# Patient Record
Sex: Male | Born: 1955 | Race: Black or African American | Hispanic: No | Marital: Single | Smoking: Never smoker
Health system: Southern US, Community
[De-identification: ages and names within clinical notes are randomized; demographics above are authoritative.]

## PROBLEM LIST (undated history)

## (undated) DIAGNOSIS — I739 Peripheral vascular disease, unspecified: Secondary | ICD-10-CM

## (undated) DIAGNOSIS — R0600 Dyspnea, unspecified: Secondary | ICD-10-CM

## (undated) DIAGNOSIS — N189 Chronic kidney disease, unspecified: Secondary | ICD-10-CM

## (undated) DIAGNOSIS — I10 Essential (primary) hypertension: Secondary | ICD-10-CM

## (undated) DIAGNOSIS — H269 Unspecified cataract: Secondary | ICD-10-CM

## (undated) DIAGNOSIS — T8859XA Other complications of anesthesia, initial encounter: Secondary | ICD-10-CM

## (undated) DIAGNOSIS — G629 Polyneuropathy, unspecified: Secondary | ICD-10-CM

## (undated) DIAGNOSIS — I129 Hypertensive chronic kidney disease with stage 1 through stage 4 chronic kidney disease, or unspecified chronic kidney disease: Secondary | ICD-10-CM

## (undated) DIAGNOSIS — E049 Nontoxic goiter, unspecified: Secondary | ICD-10-CM

## (undated) DIAGNOSIS — H409 Unspecified glaucoma: Secondary | ICD-10-CM

## (undated) DIAGNOSIS — N186 End stage renal disease: Secondary | ICD-10-CM

## (undated) DIAGNOSIS — I959 Hypotension, unspecified: Secondary | ICD-10-CM

## (undated) HISTORY — PX: EYE SURGERY: SHX253

## (undated) HISTORY — DX: Peripheral vascular disease, unspecified: I73.9

## (undated) HISTORY — PX: AV FISTULA PLACEMENT: SHX1204

## (undated) HISTORY — PX: TOTAL HIP ARTHROPLASTY: SHX124

## (undated) HISTORY — DX: Unspecified glaucoma: H40.9

---
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
## 1999-08-02 ENCOUNTER — Emergency Department (HOSPITAL_COMMUNITY): Admission: EM | Admit: 1999-08-02 | Discharge: 1999-08-02 | Payer: Self-pay | Admitting: Emergency Medicine

## 2004-01-02 ENCOUNTER — Ambulatory Visit (HOSPITAL_COMMUNITY): Admission: RE | Admit: 2004-01-02 | Discharge: 2004-01-02 | Payer: Self-pay | Admitting: Nephrology

## 2004-01-04 ENCOUNTER — Ambulatory Visit (HOSPITAL_COMMUNITY): Admission: RE | Admit: 2004-01-04 | Discharge: 2004-01-04 | Payer: Self-pay | Admitting: Vascular Surgery

## 2004-01-27 ENCOUNTER — Ambulatory Visit (HOSPITAL_COMMUNITY): Admission: RE | Admit: 2004-01-27 | Discharge: 2004-01-27 | Payer: Self-pay | Admitting: Vascular Surgery

## 2004-07-06 ENCOUNTER — Ambulatory Visit (HOSPITAL_COMMUNITY): Admission: RE | Admit: 2004-07-06 | Discharge: 2004-07-06 | Payer: Self-pay | Admitting: Nephrology

## 2011-01-06 ENCOUNTER — Encounter: Payer: Self-pay | Admitting: Nephrology

## 2014-08-24 DIAGNOSIS — R52 Pain, unspecified: Secondary | ICD-10-CM | POA: Insufficient documentation

## 2014-09-14 DIAGNOSIS — L299 Pruritus, unspecified: Secondary | ICD-10-CM | POA: Insufficient documentation

## 2014-09-14 DIAGNOSIS — R519 Headache, unspecified: Secondary | ICD-10-CM | POA: Insufficient documentation

## 2014-09-14 DIAGNOSIS — R509 Fever, unspecified: Secondary | ICD-10-CM | POA: Insufficient documentation

## 2014-09-14 DIAGNOSIS — Z4931 Encounter for adequacy testing for hemodialysis: Secondary | ICD-10-CM | POA: Insufficient documentation

## 2014-09-14 DIAGNOSIS — D689 Coagulation defect, unspecified: Secondary | ICD-10-CM | POA: Insufficient documentation

## 2019-10-04 DIAGNOSIS — T7840XA Allergy, unspecified, initial encounter: Secondary | ICD-10-CM | POA: Insufficient documentation

## 2020-02-15 DIAGNOSIS — N2581 Secondary hyperparathyroidism of renal origin: Secondary | ICD-10-CM | POA: Diagnosis not present

## 2020-02-15 DIAGNOSIS — N186 End stage renal disease: Secondary | ICD-10-CM | POA: Diagnosis not present

## 2020-02-15 DIAGNOSIS — Z992 Dependence on renal dialysis: Secondary | ICD-10-CM | POA: Diagnosis not present

## 2020-02-15 DIAGNOSIS — Z23 Encounter for immunization: Secondary | ICD-10-CM | POA: Diagnosis not present

## 2020-02-16 ENCOUNTER — Encounter (HOSPITAL_COMMUNITY): Payer: Self-pay

## 2020-02-16 NOTE — Telephone Encounter (Signed)

## 2020-02-17 DIAGNOSIS — Z992 Dependence on renal dialysis: Secondary | ICD-10-CM | POA: Diagnosis not present

## 2020-02-17 DIAGNOSIS — N2581 Secondary hyperparathyroidism of renal origin: Secondary | ICD-10-CM | POA: Diagnosis not present

## 2020-02-17 DIAGNOSIS — N186 End stage renal disease: Secondary | ICD-10-CM | POA: Diagnosis not present

## 2020-02-17 DIAGNOSIS — Z23 Encounter for immunization: Secondary | ICD-10-CM | POA: Diagnosis not present

## 2020-02-18 ENCOUNTER — Ambulatory Visit (INDEPENDENT_AMBULATORY_CARE_PROVIDER_SITE_OTHER): Payer: Medicare Other | Admitting: Vascular Surgery

## 2020-02-18 ENCOUNTER — Other Ambulatory Visit: Payer: Self-pay

## 2020-02-18 ENCOUNTER — Encounter: Payer: Self-pay | Admitting: Vascular Surgery

## 2020-02-18 VITALS — BP 118/69 | HR 78 | Temp 98.0°F | Resp 20 | Ht 68.75 in | Wt 131.0 lb

## 2020-02-18 DIAGNOSIS — N186 End stage renal disease: Secondary | ICD-10-CM

## 2020-02-18 NOTE — Progress Notes (Signed)
Patient ID: Timothy Ponce, male   DOB: 1956/04/10, 64 y.o.   MRN: [id number]  Reason for Consult: New Patient (Initial Visit)   Referred by Claudia Desanctis, MD  Subjective:     HPI:  Timothy Ponce is a 64 y.o. male previous failed left arm access has been on dialysis for many years for the right arm.  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  States that he does not have any significant bleeding.  Does not have any continued pain.  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  No other tissue loss or ulceration in the hand.  Does not have any current complications from the large pseudoaneurysms.  Past Medical History:  Diagnosis Date  . Chronic kidney disease   . Glaucoma    History reviewed. No pertinent family history. Past Surgical History:  Procedure Laterality Date  . AV FISTULA PLACEMENT      Short Social History:  Social History   Tobacco Use  . Smoking status: Never Smoker  . Smokeless tobacco: Never Used  Substance Use Topics  . Alcohol use: Not Currently    Allergies  Allergen Reactions  . Iron Dextran     Other reaction(s): Breathing Problems  . Lac Bovis     Other reaction(s): Unknown    Current Outpatient Medications  Medication Sig Dispense Refill  . ALPHAGAN P 0.1 % SOLN     . calcitRIOL (ROCALTROL) 0.25 MCG capsule Take by mouth.    . dorzolamide-timolol (COSOPT) 22.3-6.8 MG/ML ophthalmic solution     . LUMIGAN 0.01 % SOLN     . Methoxy PEG-Epoetin Beta (MIRCERA IJ) Mircera    . multivitamin (RENA-VIT) TABS tablet Take by mouth.    . RHOPRESSA 0.02 % SOLN     . SENSIPAR 30 MG tablet Take 30 mg by mouth at bedtime.    . sevelamer carbonate (RENVELA) 800 MG tablet      No current facility-administered medications for this visit.    Review of Systems  Constitutional:  Constitutional negative. HENT: HENT negative.    Eyes: Eyes negative.  Respiratory: Respiratory negative.  Cardiovascular: Cardiovascular negative.  GI: Gastrointestinal negative.  Musculoskeletal: Positive for joint pain.  Skin:       Itching Neurological: Neurological negative. Psychiatric: Psychiatric negative.        Objective:  Objective   Vitals:   02/18/20 1124  BP: 118/69  Pulse: 78  Resp: 20  Temp: 98 F (36.7 C)  SpO2: 100%  Weight: 131 lb (59.4 kg)  Height: 5' 8.75" (1.746 m)   Body mass index is 19.49 kg/m.  Physical Exam HENT:     Head: Normocephalic.     Mouth/Throat:     Comments: Mask in place Eyes:     Pupils: Pupils are equal, round, and reactive to light.  Cardiovascular:     Rate and Rhythm: Normal rate.     Comments: Palpable radial pulses bilaterally Pulmonary:     Effort: Pulmonary effort is normal.  Abdominal:     General: Abdomen is flat.     Palpations: Abdomen is soft.  Musculoskeletal:        General: Normal range of motion.     Cervical back: Normal range of motion.     Comments: Right arm with 2 large pseudoaneurysms in the mid forearm.  There is significant  dilatation of the vein cephalad to this.  Skin:    Capillary Refill: Capillary refill takes less than 2 seconds.  Neurological:     Mental Status: He is alert.  Psychiatric:        Mood and Affect: Mood normal.        Thought Content: Thought content normal.     Data: No studies     Assessment/Plan:     64 year old male on dialysis via right arm AV fistula for many years.  2 large pseudoaneurysms but he is not having any bleeding.  There is some thin discolored skin but no ulceration.  Patient could always have conversion to an upper arm fistula ligation of the forearm possible excision of the pseudoaneurysms.  I would otherwise not recommend any intervention at this time given that he has no complications and could likely lead to thrombosis of the fistula in the forearm.  Patient will call if he has issues he does  demonstrate very good understanding and has a very good knowledge of dialysis in general     Waynetta Sandy MD [facility]

## 2020-02-19 DIAGNOSIS — Z23 Encounter for immunization: Secondary | ICD-10-CM | POA: Diagnosis not present

## 2020-02-19 DIAGNOSIS — N2581 Secondary hyperparathyroidism of renal origin: Secondary | ICD-10-CM | POA: Diagnosis not present

## 2020-02-19 DIAGNOSIS — Z992 Dependence on renal dialysis: Secondary | ICD-10-CM | POA: Diagnosis not present

## 2020-02-19 DIAGNOSIS — N186 End stage renal disease: Secondary | ICD-10-CM | POA: Diagnosis not present

## 2020-02-22 DIAGNOSIS — Z992 Dependence on renal dialysis: Secondary | ICD-10-CM | POA: Diagnosis not present

## 2020-02-22 DIAGNOSIS — Z23 Encounter for immunization: Secondary | ICD-10-CM | POA: Diagnosis not present

## 2020-02-22 DIAGNOSIS — N186 End stage renal disease: Secondary | ICD-10-CM | POA: Diagnosis not present

## 2020-02-22 DIAGNOSIS — N2581 Secondary hyperparathyroidism of renal origin: Secondary | ICD-10-CM | POA: Diagnosis not present

## 2020-02-24 DIAGNOSIS — Z992 Dependence on renal dialysis: Secondary | ICD-10-CM | POA: Diagnosis not present

## 2020-02-24 DIAGNOSIS — N186 End stage renal disease: Secondary | ICD-10-CM | POA: Diagnosis not present

## 2020-02-24 DIAGNOSIS — Z23 Encounter for immunization: Secondary | ICD-10-CM | POA: Diagnosis not present

## 2020-02-24 DIAGNOSIS — N2581 Secondary hyperparathyroidism of renal origin: Secondary | ICD-10-CM | POA: Diagnosis not present

## 2020-02-26 DIAGNOSIS — Z992 Dependence on renal dialysis: Secondary | ICD-10-CM | POA: Diagnosis not present

## 2020-02-26 DIAGNOSIS — Z23 Encounter for immunization: Secondary | ICD-10-CM | POA: Diagnosis not present

## 2020-02-26 DIAGNOSIS — N186 End stage renal disease: Secondary | ICD-10-CM | POA: Diagnosis not present

## 2020-02-26 DIAGNOSIS — N2581 Secondary hyperparathyroidism of renal origin: Secondary | ICD-10-CM | POA: Diagnosis not present

## 2020-02-29 DIAGNOSIS — Z992 Dependence on renal dialysis: Secondary | ICD-10-CM | POA: Diagnosis not present

## 2020-02-29 DIAGNOSIS — Z23 Encounter for immunization: Secondary | ICD-10-CM | POA: Diagnosis not present

## 2020-02-29 DIAGNOSIS — N2581 Secondary hyperparathyroidism of renal origin: Secondary | ICD-10-CM | POA: Diagnosis not present

## 2020-02-29 DIAGNOSIS — N186 End stage renal disease: Secondary | ICD-10-CM | POA: Diagnosis not present

## 2020-03-04 DIAGNOSIS — Z23 Encounter for immunization: Secondary | ICD-10-CM | POA: Diagnosis not present

## 2020-03-04 DIAGNOSIS — Z992 Dependence on renal dialysis: Secondary | ICD-10-CM | POA: Diagnosis not present

## 2020-03-04 DIAGNOSIS — N2581 Secondary hyperparathyroidism of renal origin: Secondary | ICD-10-CM | POA: Diagnosis not present

## 2020-03-04 DIAGNOSIS — N186 End stage renal disease: Secondary | ICD-10-CM | POA: Diagnosis not present

## 2020-03-07 DIAGNOSIS — Z23 Encounter for immunization: Secondary | ICD-10-CM | POA: Diagnosis not present

## 2020-03-07 DIAGNOSIS — N2581 Secondary hyperparathyroidism of renal origin: Secondary | ICD-10-CM | POA: Diagnosis not present

## 2020-03-07 DIAGNOSIS — Z992 Dependence on renal dialysis: Secondary | ICD-10-CM | POA: Diagnosis not present

## 2020-03-07 DIAGNOSIS — N186 End stage renal disease: Secondary | ICD-10-CM | POA: Diagnosis not present

## 2020-03-09 DIAGNOSIS — Z23 Encounter for immunization: Secondary | ICD-10-CM | POA: Diagnosis not present

## 2020-03-09 DIAGNOSIS — N186 End stage renal disease: Secondary | ICD-10-CM | POA: Diagnosis not present

## 2020-03-09 DIAGNOSIS — Z992 Dependence on renal dialysis: Secondary | ICD-10-CM | POA: Diagnosis not present

## 2020-03-09 DIAGNOSIS — N2581 Secondary hyperparathyroidism of renal origin: Secondary | ICD-10-CM | POA: Diagnosis not present

## 2020-03-11 DIAGNOSIS — N186 End stage renal disease: Secondary | ICD-10-CM | POA: Diagnosis not present

## 2020-03-11 DIAGNOSIS — N2581 Secondary hyperparathyroidism of renal origin: Secondary | ICD-10-CM | POA: Diagnosis not present

## 2020-03-11 DIAGNOSIS — Z992 Dependence on renal dialysis: Secondary | ICD-10-CM | POA: Diagnosis not present

## 2020-03-11 DIAGNOSIS — Z23 Encounter for immunization: Secondary | ICD-10-CM | POA: Diagnosis not present

## 2020-03-14 DIAGNOSIS — Z23 Encounter for immunization: Secondary | ICD-10-CM | POA: Diagnosis not present

## 2020-03-14 DIAGNOSIS — N2581 Secondary hyperparathyroidism of renal origin: Secondary | ICD-10-CM | POA: Diagnosis not present

## 2020-03-14 DIAGNOSIS — N186 End stage renal disease: Secondary | ICD-10-CM | POA: Diagnosis not present

## 2020-03-14 DIAGNOSIS — Z992 Dependence on renal dialysis: Secondary | ICD-10-CM | POA: Diagnosis not present

## 2020-05-17 DIAGNOSIS — R197 Diarrhea, unspecified: Secondary | ICD-10-CM | POA: Insufficient documentation

## 2020-05-30 ENCOUNTER — Encounter (HOSPITAL_COMMUNITY): Payer: Self-pay | Admitting: Emergency Medicine

## 2020-05-30 ENCOUNTER — Inpatient Hospital Stay (HOSPITAL_COMMUNITY)
Admission: EM | Admit: 2020-05-30 | Discharge: 2020-06-07 | DRG: 474 | Disposition: A | Discharge status: Skilled Nursing Facility | Payer: Medicare Other | Attending: Internal Medicine | Admitting: Internal Medicine

## 2020-05-30 ENCOUNTER — Emergency Department (HOSPITAL_COMMUNITY): Payer: Medicare Other

## 2020-05-30 DIAGNOSIS — I953 Hypotension of hemodialysis: Secondary | ICD-10-CM | POA: Diagnosis not present

## 2020-05-30 DIAGNOSIS — M869 Osteomyelitis, unspecified: Secondary | ICD-10-CM | POA: Diagnosis present

## 2020-05-30 DIAGNOSIS — N2581 Secondary hyperparathyroidism of renal origin: Secondary | ICD-10-CM | POA: Diagnosis present

## 2020-05-30 DIAGNOSIS — K5900 Constipation, unspecified: Secondary | ICD-10-CM | POA: Diagnosis not present

## 2020-05-30 DIAGNOSIS — D539 Nutritional anemia, unspecified: Secondary | ICD-10-CM | POA: Diagnosis present

## 2020-05-30 DIAGNOSIS — L02611 Cutaneous abscess of right foot: Secondary | ICD-10-CM | POA: Diagnosis present

## 2020-05-30 DIAGNOSIS — M868X7 Other osteomyelitis, ankle and foot: Secondary | ICD-10-CM | POA: Diagnosis present

## 2020-05-30 DIAGNOSIS — H409 Unspecified glaucoma: Secondary | ICD-10-CM | POA: Diagnosis present

## 2020-05-30 DIAGNOSIS — D631 Anemia in chronic kidney disease: Secondary | ICD-10-CM | POA: Diagnosis present

## 2020-05-30 DIAGNOSIS — E739 Lactose intolerance, unspecified: Secondary | ICD-10-CM | POA: Diagnosis present

## 2020-05-30 DIAGNOSIS — Z20822 Contact with and (suspected) exposure to covid-19: Secondary | ICD-10-CM | POA: Diagnosis present

## 2020-05-30 DIAGNOSIS — N186 End stage renal disease: Secondary | ICD-10-CM | POA: Diagnosis present

## 2020-05-30 DIAGNOSIS — Z888 Allergy status to other drugs, medicaments and biological substances status: Secondary | ICD-10-CM | POA: Diagnosis not present

## 2020-05-30 DIAGNOSIS — I739 Peripheral vascular disease, unspecified: Secondary | ICD-10-CM | POA: Diagnosis present

## 2020-05-30 DIAGNOSIS — Z79899 Other long term (current) drug therapy: Secondary | ICD-10-CM

## 2020-05-30 DIAGNOSIS — M86171 Other acute osteomyelitis, right ankle and foot: Secondary | ICD-10-CM | POA: Diagnosis not present

## 2020-05-30 DIAGNOSIS — E8889 Other specified metabolic disorders: Secondary | ICD-10-CM | POA: Diagnosis present

## 2020-05-30 DIAGNOSIS — Z992 Dependence on renal dialysis: Secondary | ICD-10-CM | POA: Diagnosis not present

## 2020-05-30 DIAGNOSIS — D62 Acute posthemorrhagic anemia: Secondary | ICD-10-CM | POA: Diagnosis not present

## 2020-05-30 DIAGNOSIS — D509 Iron deficiency anemia, unspecified: Secondary | ICD-10-CM | POA: Diagnosis not present

## 2020-05-30 DIAGNOSIS — N189 Chronic kidney disease, unspecified: Secondary | ICD-10-CM | POA: Diagnosis present

## 2020-05-30 DIAGNOSIS — Z751 Person awaiting admission to adequate facility elsewhere: Secondary | ICD-10-CM | POA: Diagnosis not present

## 2020-05-30 DIAGNOSIS — Z89421 Acquired absence of other right toe(s): Secondary | ICD-10-CM | POA: Diagnosis not present

## 2020-05-30 LAB — CBC WITH DIFFERENTIAL/PLATELET
Abs Immature Granulocytes: 0.02 10*3/uL (ref 0.00–0.07)
Basophils Absolute: 0 10*3/uL (ref 0.0–0.1)
Basophils Relative: 1 %
Eosinophils Absolute: 0.5 10*3/uL (ref 0.0–0.5)
Eosinophils Relative: 8 %
HCT: 30.7 % — ABNORMAL LOW (ref 39.0–52.0)
Hemoglobin: 9.4 g/dL — ABNORMAL LOW (ref 13.0–17.0)
Immature Granulocytes: 0 %
Lymphocytes Relative: 18 %
Lymphs Abs: 1.2 10*3/uL (ref 0.7–4.0)
MCH: 32.9 pg (ref 26.0–34.0)
MCHC: 30.6 g/dL (ref 30.0–36.0)
MCV: 107.3 fL — ABNORMAL HIGH (ref 80.0–100.0)
Monocytes Absolute: 0.6 10*3/uL (ref 0.1–1.0)
Monocytes Relative: 8 %
Neutro Abs: 4.5 10*3/uL (ref 1.7–7.7)
Neutrophils Relative %: 65 %
Platelets: 259 10*3/uL (ref 150–400)
RBC: 2.86 MIL/uL — ABNORMAL LOW (ref 4.22–5.81)
RDW: 16 % — ABNORMAL HIGH (ref 11.5–15.5)
WBC: 6.9 10*3/uL (ref 4.0–10.5)
nRBC: 0 % (ref 0.0–0.2)

## 2020-05-30 LAB — COMPREHENSIVE METABOLIC PANEL
ALT: 19 U/L (ref 0–44)
AST: 17 U/L (ref 15–41)
Albumin: 3.2 g/dL — ABNORMAL LOW (ref 3.5–5.0)
Alkaline Phosphatase: 96 U/L (ref 38–126)
Anion gap: 12 (ref 5–15)
BUN: 7 mg/dL — ABNORMAL LOW (ref 8–23)
CO2: 33 mmol/L — ABNORMAL HIGH (ref 22–32)
Calcium: 8.6 mg/dL — ABNORMAL LOW (ref 8.9–10.3)
Chloride: 93 mmol/L — ABNORMAL LOW (ref 98–111)
Creatinine, Ser: 5.72 mg/dL — ABNORMAL HIGH (ref 0.61–1.24)
GFR calc Af Amer: 11 mL/min — ABNORMAL LOW (ref >60)
GFR calc non Af Amer: 10 mL/min — ABNORMAL LOW (ref >60)
Glucose, Bld: 93 mg/dL (ref 70–99)
Potassium: 3.8 mmol/L (ref 3.5–5.1)
Sodium: 138 mmol/L (ref 135–145)
Total Bilirubin: 0.8 mg/dL (ref 0.3–1.2)
Total Protein: 7.3 g/dL (ref 6.5–8.1)

## 2020-05-30 LAB — LACTIC ACID, PLASMA: Lactic Acid, Venous: 1 mmol/L (ref 0.5–1.9)

## 2020-05-30 MED ORDER — VANCOMYCIN HCL IN DEXTROSE 1-5 GM/200ML-% IV SOLN
1000.0000 mg | Freq: Once | INTRAVENOUS | Status: AC
  Administered 2020-05-31: 1000 mg via INTRAVENOUS
  Filled 2020-05-30: qty 200

## 2020-05-30 MED ORDER — BRIMONIDINE TARTRATE 0.15 % OP SOLN: 1.0000 [drp] | Freq: Two times a day (BID) | OPHTHALMIC | Status: DC

## 2020-05-30 MED ORDER — NETARSUDIL DIMESYLATE 0.02 % OP SOLN
1.0000 [drp] | Freq: Every day | OPHTHALMIC | Status: DC
  Administered 2020-05-31 – 2020-06-06 (×7): 1 [drp] via OPHTHALMIC
  Filled 2020-05-30: qty 1

## 2020-05-30 MED ORDER — SEVELAMER CARBONATE 800 MG PO TABS
4000.0000 mg | ORAL_TABLET | Freq: Three times a day (TID) | ORAL | Status: DC
  Administered 2020-05-31 – 2020-06-07 (×20): 4000 mg via ORAL
  Filled 2020-05-30 (×20): qty 5

## 2020-05-30 MED ORDER — DORZOLAMIDE HCL-TIMOLOL MAL 2-0.5 % OP SOLN: 1.0000 [drp] | Freq: Two times a day (BID) | OPHTHALMIC | Status: DC

## 2020-05-30 MED ORDER — LATANOPROST 0.005 % OP SOLN: 1.0000 [drp] | Freq: Every day | OPHTHALMIC | Status: DC

## 2020-05-30 MED ORDER — CINACALCET HCL 30 MG PO TABS
30.0000 mg | ORAL_TABLET | Freq: Every day | ORAL | Status: DC
  Filled 2020-05-30 (×3): qty 1

## 2020-05-30 MED ORDER — DORZOLAMIDE HCL-TIMOLOL MAL 2-0.5 % OP SOLN
1.0000 [drp] | Freq: Two times a day (BID) | OPHTHALMIC | Status: DC
  Administered 2020-05-31 – 2020-06-07 (×17): 1 [drp] via OPHTHALMIC
  Filled 2020-05-30: qty 10

## 2020-05-30 MED ORDER — NETARSUDIL DIMESYLATE 0.02 % OP SOLN: 1.0000 [drp] | Freq: Every day | OPHTHALMIC | Status: DC

## 2020-05-30 MED ORDER — SEVELAMER CARBONATE 800 MG PO TABS
1600.0000 mg | ORAL_TABLET | ORAL | Status: DC | PRN
  Filled 2020-05-30: qty 2

## 2020-05-30 MED ORDER — BRIMONIDINE TARTRATE 0.15 % OP SOLN
1.0000 [drp] | Freq: Two times a day (BID) | OPHTHALMIC | Status: DC
  Administered 2020-05-31 – 2020-06-07 (×16): 1 [drp] via OPHTHALMIC
  Filled 2020-05-30: qty 5

## 2020-05-30 MED ORDER — SODIUM CHLORIDE 0.9% FLUSH: 3.0000 mL | Freq: Once | INTRAVENOUS | Status: DC

## 2020-05-30 MED ORDER — LATANOPROST 0.005 % OP SOLN
1.0000 [drp] | Freq: Every day | OPHTHALMIC | Status: DC
  Administered 2020-05-31 – 2020-06-06 (×8): 1 [drp] via OPHTHALMIC
  Filled 2020-05-30: qty 2.5

## 2020-05-30 NOTE — H&P (Signed)
Date: 05/30/2020               Patient Name:  Timothy Ponce MRN: [id number]  DOB: [birth year] Age / Sex: 64 y.o., male   PCP: Patient, No Pcp Per         Medical Service: Internal Medicine Teaching Service         Attending Physician: Dr. Sid Falcon, MD    First Contact: Dr. Darrick Meigs Pager: [phone]  Second Contact: Dr. Truman Hayward Pager: [phone]       After Hours (After 5p/  First Contact Pager: [phone]  weekends / holidays): Second Contact Pager: [phone]   Chief Complaint: Right foot pain and swelling  History of Present Illness: Patient is a 64 year old male with past medical history significant for ESRD on HD Tuesday/Thursday/Saturday and glaucoma who presents for evaluation of right foot pain and swelling.  Patient reports that approximately 3 weeks ago he noticed pain on his lateral foot when he woke up in the morning.  He is not aware of any injury that occurred.  Patient states that over the past few weeks the pain has gradually worsened and swelling has developed.  He was advised by [facility] to come in for further evaluation.  Patient denies fever, chills, nausea, vomiting, chest pain, shortness of breath.  He reports that he gets his dialysis on Tuesday, Thursday, Saturday and last complete session was today.  Meds:  Analgesic: *Tylenol 650 mg every 8 hours as needed for pain *Ibuprofen 600 mg every 6 hours as needed for pain Nephro: *Cinacalcet 30 mg at bedtime *Sevelamer carbonate (Renvela) 800 mg tablets - take 5 tablets with each meal, 2 tablets with snacks *Mircera IJ Ophthalmic:  *Alphagan eye drops - 1 drop in both eyes in morning and bedtime *Dorzolamide-timolol eye drops - 1 drop in both eyes twice daily *Lumigan eyes drops - 1 drop in both eyes at bedtime *Rhopressa eye drops - 1 drop into both eyes daily Other: *Multivitamin  Allergies: Allergies as of 05/30/2020 - Review Complete 05/30/2020  Allergen Reaction Noted  . Darvon [propoxyphene]  Other (See Comments) 05/30/2020  . Iron dextran  02/18/2020  . Lac bovis  02/18/2020  . Lactose intolerance (gi)  05/30/2020   Past Medical History:  Diagnosis Date  . Chronic kidney disease   . Glaucoma    Family History:  *Reports kidney issues in 2nd cousins  Social History:  *Denies alcohol, tobacco, or recreational drug use *Professor at [employer]  Review of Systems: A complete ROS was negative except as per HPI.  Physical Exam: Blood pressure (!) 155/66, pulse (!) 56, temperature 97.6 F (36.4 C), temperature source Oral, resp. rate 16, SpO2 100 %. Physical Exam Constitutional:      Appearance: He is not diaphoretic.  HENT:     Head: Normocephalic and atraumatic.  Eyes:     General:        Right eye: No discharge.        Left eye: No discharge.  Neck:     Trachea: No tracheal deviation.  Cardiovascular:     Rate and Rhythm: Normal rate and regular rhythm.     Heart sounds: No murmur heard.  No friction rub. No gallop.   Pulmonary:     Effort: Pulmonary effort is normal. No respiratory distress.     Breath sounds: Normal breath sounds. No wheezing or rales.  Abdominal:     General: There is no distension.     Palpations:  Abdomen is soft.     Tenderness: There is no abdominal tenderness. There is no guarding or rebound.  Musculoskeletal:        General: No tenderness or deformity. Normal range of motion.     Cervical back: Normal range of motion.  Skin:    General: Skin is warm and dry.     Findings: Erythema present. No rash.     Comments: Erythema and swelling on the lateral, dorsal surface of right foot. Pain with mild palpation. See photo below  Neurological:     Mental Status: He is alert.     Coordination: Coordination normal.  Psychiatric:        Cognition and Memory: Memory normal.        Judgment: Judgment normal.       CBC Latest Ref Rng & Units 05/30/2020  WBC 4.0 - 10.5 K/uL 6.9  Hemoglobin 13.0 - 17.0 g/dL 9.4(L)  Hematocrit 39 - 52 %  30.7(L)  Platelets 150 - 400 K/uL 259   CMP Latest Ref Rng & Units 05/30/2020  Glucose 70 - 99 mg/dL 93  BUN 8 - 23 mg/dL 7(L)  Creatinine 0.61 - 1.24 mg/dL 5.72(H)  Sodium 135 - 145 mmol/L 138  Potassium 3.5 - 5.1 mmol/L 3.8  Chloride 98 - 111 mmol/L 93(L)  CO2 22 - 32 mmol/L 33(H)  Calcium 8.9 - 10.3 mg/dL 8.6(L)  Total Protein 6.5 - 8.1 g/dL 7.3  Total Bilirubin 0.3 - 1.2 mg/dL 0.8  Alkaline Phos 38 - 126 U/L 96  AST 15 - 41 U/L 17  ALT 0 - 44 U/L 19   Right Foot Radiograph (05/30/20): FINDINGS: Vascular calcifications in the right foot. There is lucency in the 5th metatarsal head concerning for possible acute osteomyelitis. No fracture, subluxation or dislocation. IMPRESSION: Lucency within the 5th metatarsal head concerning for osteomyelitis.  Assessment & Plan by Problem: Active Problems:   Osteomyelitis [facility])  Patient is a 64 year old male with past medical history significant for ESRD on HD Tuesday/Thursday/Saturday and glaucoma who presents for evaluation of right foot pain and swelling.   # Osteomyelitis Radiograph reveals findings consistent with osteomyelitis of 5th metatarsal head. Patient without clinical evidence for systemic infection. Orthopedics consulted, will evaluate patient in AM *NPO at midnight *IV antibiotics following source control *MR right foot ordered  # ESRD Patient on HD T/R/F, last HD session today (05/30/20). BUN of 7, creatinine 5.72, bicarb 33, no indication for urgent dialysis. Access is right AV fistula. Patient will require HD in AM as patient to receive MR with contrast today. Patient discussed with nephrologist, Dr. Posey Pronto.  *Continue home cinacalcet and sevelamer carbonate *HD per nephrology  # Glaucoma: Continue home eye drops *Brimonidine, Cosopt, latanoprost, netarsudil dimesylate  Diet: Renal with fluid restriction DVT Ppx: SCDs, given potential for surgery in AM Code Status: FULL CODE Dispo: Admit patient to Inpatient with  expected length of stay greater than 2 midnights.  Signed: Jeanmarie Hubert, MD 05/30/2020, 10:56 PM

## 2020-05-30 NOTE — ED Notes (Signed)
Pt is a dialysis pt and no longer produces urine

## 2020-05-30 NOTE — Progress Notes (Signed)
Consult received.   Given the nature, extent, and duration of the wound as well as the patient's comorbidities, I have asked my colleague, Dr. Meridee Score, for his assistance with further assessment and management given his expertise and experience in this domain. Either Dr. Sharol Given or I will evaluate the patient tomorrow.   Altamese Advance, MD [facility], [facility] [phone]

## 2020-05-30 NOTE — ED Notes (Addendum)
Pt currently denying CXR until able to speak to MD. Will call Xray when pt able to go for Xray

## 2020-05-30 NOTE — ED Provider Notes (Signed)
[name] EMERGENCY DEPARTMENT Provider Note   CSN: [id number] Arrival date & time: 05/30/20  1337     History Chief Complaint  Patient presents with  . Wound Infection  . Foot Pain    Timothy Ponce is a 64 y.o. male.  Patient is a 64 year old male with history of chronic kidney disease.  He has been on hemodialysis for 30 years.  He presents today for evaluation of right fifth toe pain and swelling.  He states that it was evaluated at dialysis and they recommended he come here.  He denies fevers or chills.  The history is provided by the patient.  Foot Pain This is a new problem. The current episode started 2 days ago. The problem occurs constantly. The problem has been gradually worsening. Nothing aggravates the symptoms. Nothing relieves the symptoms. He has tried nothing for the symptoms.       Past Medical History:  Diagnosis Date  . Chronic kidney disease   . Glaucoma     There are no problems to display for this patient.   Past Surgical History:  Procedure Laterality Date  . AV FISTULA PLACEMENT         No family history on file.  Social History   Tobacco Use  . Smoking status: Never Smoker  . Smokeless tobacco: Never Used  Vaping Use  . Vaping Use: Never used  Substance Use Topics  . Alcohol use: Not Currently  . Drug use: Never    Home Medications Prior to Admission medications   Medication Sig Start Date End Date Taking? Authorizing Provider  acetaminophen (TYLENOL) 650 MG CR tablet Take 650 mg by mouth every 8 (eight) hours as needed for pain (At the time of dialysis).   Yes [provider]  ALPHAGAN P 0.1 % SOLN Place 1 drop into both eyes in the morning and at bedtime.  01/14/20  Yes [provider]  dorzolamide-timolol (COSOPT) 22.3-6.8 MG/ML ophthalmic solution Place 1 drop into both eyes 2 (two) times daily.  02/05/20  Yes [provider]  ibuprofen (ADVIL) 200 MG tablet Take 600 mg by mouth  every 6 (six) hours as needed for moderate pain.   Yes [provider]  LUMIGAN 0.01 % SOLN Place 1 drop into both eyes at bedtime.  01/31/20  Yes [provider]  multivitamin (RENA-VIT) TABS tablet Take 1 tablet by mouth daily.  10/25/14  Yes [provider]  RHOPRESSA 0.02 % SOLN Place 1 drop into both eyes daily.  01/18/20  Yes [provider]  SENSIPAR 30 MG tablet Take 30 mg by mouth at bedtime. 11/19/19  Yes [provider]  sevelamer carbonate (RENVELA) 800 MG tablet Take 1,600-4,000 mg by mouth See admin instructions. Takes 5 tablets with each meal and 2 tablets snacks. 01/14/20  Yes [provider]  Methoxy PEG-Epoetin Beta (MIRCERA IJ) Mircera 12/30/19 12/28/20  [provider]    Allergies    Darvon [propoxyphene], Iron dextran, Lac bovis, and Lactose intolerance (gi)  Review of Systems   Review of Systems  All other systems reviewed and are negative.   Physical Exam Updated Vital Signs BP (!) 155/66 (BP Location: Left Arm)   Pulse (!) 56   Temp 97.6 F (36.4 C) (Oral)   Resp 16   SpO2 100%   Physical Exam Vitals and nursing note reviewed.  Constitutional:      General: He is not in acute distress.    Appearance: He is  well-developed. He is not diaphoretic.  HENT:     Head: Normocephalic and atraumatic.  Cardiovascular:     Rate and Rhythm: Normal rate and regular rhythm.     Heart sounds: No murmur heard.  No friction rub.  Pulmonary:     Effort: Pulmonary effort is normal. No respiratory distress.     Breath sounds: Normal breath sounds. No wheezing or rales.  Abdominal:     General: Bowel sounds are normal. There is no distension.     Palpations: Abdomen is soft.     Tenderness: There is no abdominal tenderness.  Musculoskeletal:        General: Normal range of motion.     Cervical back: Normal range of motion and neck supple.     Comments: The right fifth toe has cracking and drainage from the space  between the fourth and fifth toes.  There is swelling and inflammation extending into the distal foot.  Skin:    General: Skin is warm and dry.  Neurological:     Mental Status: He is alert and oriented to person, place, and time.     Coordination: Coordination normal.     ED Results / Procedures / Treatments   Labs (all labs ordered are listed, but only abnormal results are displayed) Labs Reviewed  COMPREHENSIVE METABOLIC PANEL - Abnormal; Notable for the following components:      Result Value   Chloride 93 (*)    CO2 33 (*)    BUN 7 (*)    Creatinine, Ser 5.72 (*)    Calcium 8.6 (*)    Albumin 3.2 (*)    GFR calc non Af Amer 10 (*)    GFR calc Af Amer 11 (*)    All other components within normal limits  CBC WITH DIFFERENTIAL/PLATELET - Abnormal; Notable for the following components:   RBC 2.86 (*)    Hemoglobin 9.4 (*)    HCT 30.7 (*)    MCV 107.3 (*)    RDW 16.0 (*)    All other components within normal limits  LACTIC ACID, PLASMA  LACTIC ACID, PLASMA  URINALYSIS, ROUTINE W REFLEX MICROSCOPIC    EKG None  Radiology DG Foot Complete Right  Result Date: 05/30/2020 CLINICAL DATA:  Fifth toe infection EXAM: RIGHT FOOT COMPLETE - 3+ VIEW COMPARISON:  None. FINDINGS: Vascular calcifications in the right foot. There is lucency in the 5th metatarsal head concerning for possible acute osteomyelitis. No fracture, subluxation or dislocation. IMPRESSION: Lucency within the 5th metatarsal head concerning for osteomyelitis. Electronically Signed   By: Rolm Baptise M.D.   On: 05/30/2020 21:39    Procedures Procedures (including critical care time)  Medications Ordered in ED Medications  sodium chloride flush (NS) 0.9 % injection 3 mL (has no administration in time range)    ED Course  I have reviewed the triage vital signs and the nursing notes.  Pertinent labs & imaging results that were available during my care of the patient were reviewed by me and considered in my  medical decision making (see chart for details).    MDM Rules/Calculators/A&P  Patient presenting here with complaints of swelling and drainage from his fifth toe.  He has history of renal disease and is on dialysis.  He does have skin breakdown with drainage between the fourth and fifth toes and some swelling extending into the distal foot.  His laboratory studies are basically unremarkable.  He is afebrile.  However, x-rays do show findings concerning for  osteomyelitis in the distal metatarsal.  This finding was discussed with Dr. Marcelino Scot from orthopedics who will have the patient evaluated in the morning.  I have spoken with the hospitalist who agrees to admit.  Final Clinical Impression(s) / ED Diagnoses Final diagnoses:  None    Rx / DC Orders ED Discharge Orders    None       Veryl Speak, MD 05/30/20 2255

## 2020-05-30 NOTE — ED Triage Notes (Signed)
Pt. Stated, I have infected little toe b ut the infection covers the entire foot.

## 2020-05-30 NOTE — ED Notes (Signed)
Attending MD at bedside.

## 2020-05-31 ENCOUNTER — Encounter (HOSPITAL_COMMUNITY): Payer: Self-pay | Admitting: Internal Medicine

## 2020-05-31 ENCOUNTER — Other Ambulatory Visit: Payer: Self-pay | Admitting: Physician Assistant

## 2020-05-31 ENCOUNTER — Ambulatory Visit: Payer: Medicare Other | Admitting: Occupational Therapy

## 2020-05-31 ENCOUNTER — Other Ambulatory Visit: Payer: Self-pay

## 2020-05-31 ENCOUNTER — Inpatient Hospital Stay (HOSPITAL_COMMUNITY): Payer: Medicare Other

## 2020-05-31 DIAGNOSIS — M869 Osteomyelitis, unspecified: Secondary | ICD-10-CM

## 2020-05-31 DIAGNOSIS — L02611 Cutaneous abscess of right foot: Secondary | ICD-10-CM

## 2020-05-31 DIAGNOSIS — M86171 Other acute osteomyelitis, right ankle and foot: Secondary | ICD-10-CM

## 2020-05-31 DIAGNOSIS — D631 Anemia in chronic kidney disease: Secondary | ICD-10-CM

## 2020-05-31 DIAGNOSIS — Z992 Dependence on renal dialysis: Secondary | ICD-10-CM

## 2020-05-31 DIAGNOSIS — I739 Peripheral vascular disease, unspecified: Secondary | ICD-10-CM | POA: Diagnosis present

## 2020-05-31 DIAGNOSIS — N186 End stage renal disease: Secondary | ICD-10-CM

## 2020-05-31 DIAGNOSIS — H409 Unspecified glaucoma: Secondary | ICD-10-CM

## 2020-05-31 LAB — CBC
HCT: 31.3 % — ABNORMAL LOW (ref 39.0–52.0)
Hemoglobin: 9.9 g/dL — ABNORMAL LOW (ref 13.0–17.0)
MCH: 33.3 pg (ref 26.0–34.0)
MCHC: 31.6 g/dL (ref 30.0–36.0)
MCV: 105.4 fL — ABNORMAL HIGH (ref 80.0–100.0)
Platelets: 267 10*3/uL (ref 150–400)
RBC: 2.97 MIL/uL — ABNORMAL LOW (ref 4.22–5.81)
RDW: 15.9 % — ABNORMAL HIGH (ref 11.5–15.5)
WBC: 6.7 10*3/uL (ref 4.0–10.5)
nRBC: 0.3 % — ABNORMAL HIGH (ref 0.0–0.2)

## 2020-05-31 LAB — RENAL FUNCTION PANEL
Albumin: 2.9 g/dL — ABNORMAL LOW (ref 3.5–5.0)
Anion gap: 12 (ref 5–15)
BUN: 16 mg/dL (ref 8–23)
CO2: 30 mmol/L (ref 22–32)
Calcium: 9.4 mg/dL (ref 8.9–10.3)
Chloride: 93 mmol/L — ABNORMAL LOW (ref 98–111)
Creatinine, Ser: 7.63 mg/dL — ABNORMAL HIGH (ref 0.61–1.24)
GFR calc Af Amer: 8 mL/min — ABNORMAL LOW (ref >60)
GFR calc non Af Amer: 7 mL/min — ABNORMAL LOW (ref >60)
Glucose, Bld: 92 mg/dL (ref 70–99)
Phosphorus: 3.6 mg/dL (ref 2.5–4.6)
Potassium: 4.6 mmol/L (ref 3.5–5.1)
Sodium: 135 mmol/L (ref 135–145)

## 2020-05-31 LAB — C-REACTIVE PROTEIN: CRP: 2.1 mg/dL — ABNORMAL HIGH (ref <1.0)

## 2020-05-31 LAB — SARS CORONAVIRUS 2 BY RT PCR (HOSPITAL ORDER, PERFORMED IN ~~LOC~~ HOSPITAL LAB): SARS Coronavirus 2: NEGATIVE

## 2020-05-31 LAB — HIV ANTIBODY (ROUTINE TESTING W REFLEX): HIV Screen 4th Generation wRfx: NONREACTIVE

## 2020-05-31 LAB — SEDIMENTATION RATE: Sed Rate: 65 mm/hr — ABNORMAL HIGH (ref 0–16)

## 2020-05-31 LAB — VITAMIN B12: Vitamin B-12: 1672 pg/mL — ABNORMAL HIGH (ref 180–914)

## 2020-05-31 LAB — LACTIC ACID, PLASMA: Lactic Acid, Venous: 1.2 mmol/L (ref 0.5–1.9)

## 2020-05-31 MED ORDER — ACETAMINOPHEN 325 MG PO TABS: 650.0000 mg | ORAL_TABLET | Freq: Four times a day (QID) | ORAL | Status: DC | PRN

## 2020-05-31 MED ORDER — VANCOMYCIN HCL IN DEXTROSE 500-5 MG/100ML-% IV SOLN
500.0000 mg | INTRAVENOUS | Status: DC
  Administered 2020-06-01: 500 mg via INTRAVENOUS

## 2020-05-31 MED ORDER — HYDROMORPHONE HCL 2 MG PO TABS
1.0000 mg | ORAL_TABLET | ORAL | Status: DC | PRN
  Administered 2020-05-31 – 2020-06-02 (×8): 1 mg via ORAL
  Filled 2020-05-31 (×8): qty 1

## 2020-05-31 MED ORDER — RENA-VITE PO TABS
1.0000 | ORAL_TABLET | Freq: Every day | ORAL | Status: DC
  Administered 2020-05-31 – 2020-06-07 (×8): 1 via ORAL
  Filled 2020-05-31 (×9): qty 1

## 2020-05-31 MED ORDER — HEPARIN SODIUM (PORCINE) 5000 UNIT/ML IJ SOLN
5000.0000 [IU] | Freq: Three times a day (TID) | INTRAMUSCULAR | Status: AC
  Administered 2020-05-31: 5000 [IU] via SUBCUTANEOUS
  Filled 2020-05-31 (×2): qty 1

## 2020-05-31 MED ORDER — CHLORHEXIDINE GLUCONATE CLOTH 2 % EX PADS
6.0000 | MEDICATED_PAD | Freq: Every day | CUTANEOUS | Status: DC
  Administered 2020-05-31 – 2020-06-07 (×8): 6 via TOPICAL

## 2020-05-31 MED ORDER — HEPARIN SODIUM (PORCINE) 5000 UNIT/ML IJ SOLN: 5000.0000 [IU] | Freq: Three times a day (TID) | INTRAMUSCULAR | Status: DC

## 2020-05-31 NOTE — Consult Note (Signed)
[facility] Renal Consultation Note    Indication for Consultation:  Management of ESRD/hemodialysis, anemia, hypertension/volume, and secondary hyperparathyroidism.  HPI: Timothy Ponce is a 64 y.o. male with PMH including ESRD on dialysis TTS for 30 years and glaucoma/cataracts who presented to the ED on 05/30/20 with R foot pain. Patient reports worsening pain and redness of the foot for the past several days. He was seen by NP at outpatient dialysis unit and advised to go to ED for evaluation. He did receive his full HD treatment yesterday. On arrival, afebrile with VSS, no leukocytosis, Hgb 9.4, BUN 7, Cr 5.72, K+ 4.6. Xray concerning for osteomyelitis in the distal metatarsal. Patient was seen by ortho this AM and started on IV vancomycin with plan for fifth ray amputation on Friday.  On exam, patient denies SOB, CP, palpitations, dizziness. Reports some abdominal pain last night that resolved with eating, no nausea, vomiting or diarrhea. Of note recent outpatient AF decreased and  Pt had been referred for a fistulogram. He believes this was due to swelling and not a true decline. 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 Donzetta Matters in 02/2019 with no intervention indicated at that time.   Past Medical History:  Diagnosis Date   Chronic kidney disease    Glaucoma    Past Surgical History:  Procedure Laterality Date   AV FISTULA PLACEMENT     History reviewed. No pertinent family history. Social History:  reports that he has never smoked. He has never used smokeless tobacco. He reports previous alcohol use. He reports that he does not use drugs.  ROS: As per HPI otherwise negative.   Physical Exam: Vitals:   05/31/20 0105 05/31/20 0146 05/31/20 0536 05/31/20 0900  BP: (!) 153/68 139/84 123/76   Pulse: (!) 54 89 61   Resp: 17 18 18    Temp: 98 F (36.7 C) 98.2 F (36.8 C) 97.9 F (36.6 C)   TempSrc: Oral Oral Oral   SpO2: 98%  100% 100%   Weight:    59.6 kg     General: Well developed, well nourished, in no acute distress. Head: Normocephalic, atraumatic, sclera non-icteric, mucus membranes are moist. Neck: JVD not elevated. Lungs: Clear bilaterally to auscultation without wheezes, rales, or rhonchi. Breathing is unlabored. Heart: RRR with normal S1, S2. No murmurs, rubs, or gallops appreciated. Abdomen: Soft, non-tender, non-distended with normoactive bowel sounds. No rebound/guarding. No obvious abdominal masses. Musculoskeletal:  Strength and tone appear normal for age. Lower extremities: No edema or ischemic changes, no open wounds. Neuro: Alert and oriented X 3. Moves all extremities spontaneously. Psych:  Responds to questions appropriately with a normal affect. Dialysis Access: RUE AVF with stable aneurysms, + t/b, no bleeding/drainage  Allergies  Allergen Reactions   Darvon [Propoxyphene] Other (See Comments)    Lungs filling up with fluid   Iron Dextran     Other reaction(s): Breathing Problems   Lac Bovis     Other reaction(s): Unknown   Lactose Intolerance (Gi)     unknown   Prior to Admission medications   Medication Sig Start Date End Date Taking? Authorizing Provider  acetaminophen (TYLENOL) 650 MG CR tablet Take 650 mg by mouth every 8 (eight) hours as needed for pain (At the time of dialysis).   Yes [provider]  ALPHAGAN P 0.1 % SOLN Place 1 drop into both eyes in the morning and at bedtime.  01/14/20  Yes [provider]  dorzolamide-timolol (COSOPT) 22.3-6.8 MG/ML ophthalmic solution Place 1 drop into both eyes 2 (two) times daily.  02/05/20  Yes [provider]  ibuprofen (ADVIL) 200 MG tablet Take 600 mg by mouth every 6 (six) hours as needed for moderate pain.   Yes [provider]  LUMIGAN 0.01 % SOLN Place 1 drop into both eyes at bedtime.  01/31/20  Yes [provider]  multivitamin (RENA-VIT) TABS tablet Take 1 tablet by mouth  daily.  10/25/14  Yes [provider]  RHOPRESSA 0.02 % SOLN Place 1 drop into both eyes daily.  01/18/20  Yes [provider]  SENSIPAR 30 MG tablet Take 30 mg by mouth at bedtime. 11/19/19  Yes [provider]  sevelamer carbonate (RENVELA) 800 MG tablet Take 1,600-4,000 mg by mouth See admin instructions. Takes 5 tablets with each meal and 2 tablets snacks. 01/14/20  Yes [provider]  Methoxy PEG-Epoetin Beta (MIRCERA IJ) Mircera 12/30/19 12/28/20  [provider]   Current Facility-Administered Medications  Medication Dose Route Frequency Provider Last Rate Last Admin   acetaminophen (TYLENOL) tablet 650 mg  650 mg Oral Q6H PRN Mosetta Anis, MD       brimonidine (ALPHAGAN) 0.15 % ophthalmic solution 1 drop  1 drop Both Eyes BID Jean Rosenthal, MD   1 drop at 05/31/20 4259   cinacalcet (SENSIPAR) tablet 30 mg  30 mg Oral QHS Agyei, Obed K, MD       dorzolamide-timolol (COSOPT) 22.3-6.8 MG/ML ophthalmic solution 1 drop  1 drop Both Eyes BID Jean Rosenthal, MD   1 drop at 05/31/20 0824   HYDROmorphone (DILAUDID) tablet 1 mg  1 mg Oral Q4H PRN Mosetta Anis, MD       latanoprost (XALATAN) 0.005 % ophthalmic solution 1 drop  1 drop Both Eyes QHS Agyei, Obed K, MD   1 drop at 05/31/20 0210   Netarsudil Dimesylate 0.02 % SOLN 1 drop  1 drop Both Eyes Daily Agyei, Obed K, MD       sevelamer carbonate (RENVELA) tablet 1,600 mg  1,600 mg Oral PRN Jean Rosenthal, MD       sevelamer carbonate (RENVELA) tablet 4,000 mg  4,000 mg Oral TID WC Gilles Chiquito B, MD   4,000 mg at 05/31/20 0708   sodium chloride flush (NS) 0.9 % injection 3 mL  3 mL Intravenous Once Jean Rosenthal, MD       Labs: Basic Metabolic Panel: Recent Labs  Lab 05/30/20 1417 05/31/20 0751  NA 138 135  K 3.8 4.6  CL 93* 93*  CO2 33* 30  GLUCOSE 93 92  BUN 7* 16  CREATININE 5.72* 7.63*  CALCIUM 8.6* 9.4  PHOS  --  3.6   Liver Function Tests: Recent Labs  Lab 05/30/20 1417  05/31/20 0751  AST 17  --   ALT 19  --   ALKPHOS 96  --   BILITOT 0.8  --   PROT 7.3  --   ALBUMIN 3.2* 2.9*   CBC: Recent Labs  Lab 05/30/20 1417 05/31/20 0751  WBC 6.9 6.7  NEUTROABS 4.5  --   HGB 9.4* 9.9*  HCT 30.7* 31.3*  MCV 107.3* 105.4*  PLT 259 267   Studies/Results: MR FOOT RIGHT WO CONTRAST  Result Date: 05/31/2020 CLINICAL DATA:  Right foot wound EXAM: MRI OF THE RIGHT FOREFOOT WITHOUT CONTRAST TECHNIQUE: Multiplanar, multisequence MR imaging of the right was performed. No intravenous contrast was administered. COMPARISON:  None. FINDINGS: Bones/Joint/Cartilage There is increased T2 hyperintense signal with subtle T1 hypointensity involving the fifth metatarsal head. There is slight cortical irregularity seen laterally at the fifth metatarsal head. Normal osseous marrow signal seen throughout the remainder of the osseous structures. No large joint effusions. Ligaments The Lisfranc ligaments are intact. Muscles and Tendons Diffusely increased T2 hyperintense signal seen throughout the muscles. The flexor and extensor tendons are intact. The plantar fascia is intact. Soft tissues Along the dorsum and lateral aspect of the fifth metatarsal head there is subtle area of skin irregularity. A small loculated fluid collection is noted overlying the fifth metatarsal head which measures 1.7 x 0.6 x 1.5 cm. Subcutaneous edema seen overlying the dorsum of foot. IMPRESSION: Area of superficial ulceration with a probable soft tissue abscess overlying the fifth metatarsal head measuring 1.7 x 0.6 x 1.5 cm. Findings suggestive of osteomyelitis involving the fifth metatarsal head. No other acute osseous abnormalities. Electronically Signed   By: Prudencio Pair M.D.   On: 05/31/2020 05:16   DG Foot Complete Right  Result Date: 05/30/2020 CLINICAL DATA:  Fifth toe infection EXAM: RIGHT FOOT COMPLETE - 3+ VIEW COMPARISON:  None. FINDINGS: Vascular calcifications in the right foot. There is lucency  in the 5th metatarsal head concerning for possible acute osteomyelitis. No fracture, subluxation or dislocation. IMPRESSION: Lucency within the 5th metatarsal head concerning for osteomyelitis. Electronically Signed   By: Rolm Baptise M.D.   On: 05/30/2020 21:39    Dialysis Orders: Center: [facility] on TTS. TTS 180NRe, BFR 400/DFR 800, EDW 60kg, 2K/2Ca, time 3.5hr,  RUE AVF, heparin 1800 unit bolus mircera 173mcg IVP q 2 weeks- last dose 49mcg on 05/27/20 Calcitriol 1.75 mcg PO q HD Sensipar30mg  daily Binder: nevela 5 tabs TID with meals  Assessment/Plan: 1.  Osteomyelitis: Seen by ortho, on IV vanc and planned for 5th ray amputation on Friday.  2.  ESRD:  TTS schedule, no indication for urgent HD today. Plan next HD tomorrow per regular schedule. Please avoid gadolium contrast and use of RUE for BP/blood draws as possible. We can draw labs on dialysis on HD days per pt request.  3.  Hypertension/vonilume: BP controlled, below EDW and not volume overloaded on exam. Not on BP meds. BP tends to run low during dialysis. 4.  Anemia: Hgb 9.9, ESA dose recently increased and due 6/26. No IV Fe due to acute infection.  5.  Metabolic bone disease: Corr calcium high, hold calctriol and follow. Continue sensipar and binder.  6.  Nutrition:  Renal diet with fluid restrictions  Anice Paganini, PA-C 05/31/2020, 10:17 AM  [facility] Pager: [phone]

## 2020-05-31 NOTE — Progress Notes (Signed)
Pt left to MRI

## 2020-05-31 NOTE — Progress Notes (Signed)
NAME:  Timothy Ponce, MRN:  [id number], DOB:  1956/03/13, LOS: 1 ADMISSION DATE:  05/30/2020  Subjective   [name] was examined and evaluated at bedside this am. He mentions that he spoke with Dr.Duda earlier in the day and states he was informed he would most likely be taken to OR on Thursday. He states that he has been endorsing foot pain as he has not taken any medications for relief. Exacerbated by weight bearing. Pain is somewhat tolerable if he does not move his foot. He mentions that prior to pandemic, he could walk ~4 miles without claudication, dyspnea, or chest pain. He mentions significantly decreased exercise capacity since the pandemic due to staying home mostly. 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  Significant Hospital Events   6/15 admission 6/16 MRI showing osteomyelitis with abscess formation. Dr. Sharol Given planning for surgery on 6/18  Objective   Blood pressure 123/76, pulse 61, temperature 97.9 F (36.6 C), temperature source Oral, resp. rate 18, height 5' 8.75" (1.746 m), weight 59.6 kg, SpO2 100 %.     Intake/Output Summary (Last 24 hours) at 05/31/2020 1116 Last data filed at 05/31/2020 0146 Gross per 24 hour  Intake 120 ml  Output --  Net 120 ml    Examination: GENERAL: in no acute distress CARDIAC: heart RRR. No peripheral edema. Pedal pulses intact. Extremities warm. PULMONARY: breathing comfortably on room air. Lungs clear ABDOMEN: soft. Nontender to palpation.  Nondistended.  NEURO: alert and oriented SKIN: right foot exam similar to prior. Left foot with thick white tissue in the 3rd and 4th interdigit spaces. No drainage or tenderness to palpation  Consults:  ortho  Significant Diagnostic Tests:  R foot xray>>lucency in the 5th metatarsal head concerning for possible acute osteomyelitis.  R foot MRI>>area of superfiical  ulceration with probable soft tissue abscess overlying the 5th metatarsal head measuring 1.7x0.6x1.5cm. findings suggestive of osteomyelitis involving the 5th metatarsal head.  Micro Data:  6/15 blood culture x2>>  Antimicrobials:  Vancomycin 6/15>>  Summary  64 yo male with ESRD on HD (THS) who was admitted to [facility] 05/30/20 for 3w history of right foot pain. Xray in the ED concerning for osteomyelitis. Orthopedics was consulted and are planning to take to the OR on 6/18.   Assessment & Plan:  Active Problems:   Osteomyelitis of fifth toe of right foot (HCC)  Osteomyelitis of the 5th metatarsal head with soft tissue abscess. As seen on MRI. No sign of systemic infection at this time Plan Continue IV vancomycin with HD adjustments. Considered broader coverage however pt does not have any particular risk factors  Follow blood cultures F/u A1C to r/o DM Appreciate ortho recs: planning for 5th ray amputation on 6/18. Pain management: Prn po dilaudid   ESRD on HD (THS). Management per nephrology. Monitor renal function.  Macrocytic anemia. MCV 105. Hgb stable. Will obtain B12 to r/o deficiency. Suspect that infection and CKD are also playing a part.  Best practice:  CODE STATUS: FULL Diet: renal DVT for prophylaxis: heparin Social considerations/Family communication: per pt Dispo: pending further evaluation and management   Mitzi Hansen, MD [facility] PGY-1 Contact info:  Please page me at [phone] from 7am-5pm M-F.  Please use the [facility] after hours pager at [phone] after 5pm M-F and on weekends 05/31/20  11:16 AM  Labs    CBC Latest Ref Rng &  Units 05/31/2020 05/30/2020  WBC 4.0 - 10.5 K/uL 6.7 6.9  Hemoglobin 13.0 - 17.0 g/dL 9.9(L) 9.4(L)  Hematocrit 39 - 52 % 31.3(L) 30.7(L)  Platelets 150 - 400 K/uL 267 259   BMP Latest Ref Rng & Units 05/31/2020 05/30/2020  Glucose 70 - 99 mg/dL 92 93  BUN 8 - 23 mg/dL 16 7(L)  Creatinine 0.61 - 1.24 mg/dL  7.63(H) 5.72(H)  Sodium 135 - 145 mmol/L 135 138  Potassium 3.5 - 5.1 mmol/L 4.6 3.8  Chloride 98 - 111 mmol/L 93(L) 93(L)  CO2 22 - 32 mmol/L 30 33(H)  Calcium 8.9 - 10.3 mg/dL 9.4 8.6(L)

## 2020-05-31 NOTE — H&P (View-Only) (Signed)
ORTHOPAEDIC CONSULTATION  REQUESTING PHYSICIAN: Lucious Groves, DO  Chief Complaint: Pain swelling and fifth metatarsal  HPI: Timothy Ponce is a 64 y.o. male who presents with osteomyelitis of the fifth metatarsal head with draining ulcer.  Patient has end-stage renal disease on dialysis etiology unknown.  Patient does not have hypertension or diabetes.  No history of tobacco use.  Past Medical History:  Diagnosis Date   Chronic kidney disease    Glaucoma    Past Surgical History:  Procedure Laterality Date   AV FISTULA PLACEMENT     Social History   Socioeconomic History   Marital status: Single    Spouse name: Not on file   Number of children: Not on file   Years of education: Not on file   Highest education level: Not on file  Occupational History   Not on file  Tobacco Use   Smoking status: Never Smoker   Smokeless tobacco: Never Used  Vaping Use   Vaping Use: Never used  Substance and Sexual Activity   Alcohol use: Not Currently   Drug use: Never   Sexual activity: Not on file  Other Topics Concern   Not on file  Social History Narrative   Not on file   Social Determinants of Health   Financial Resource Strain:    Difficulty of Paying Living Expenses:   Food Insecurity:    Worried About Charity fundraiser in the Last Year:    Arboriculturist in the Last Year:   Transportation Needs:    Film/video editor (Medical):    Lack of Transportation (Non-Medical):   Physical Activity:    Days of Exercise per Week:    Minutes of Exercise per Session:   Stress:    Feeling of Stress :   Social Connections:    Frequency of Communication with Friends and Family:    Frequency of Social Gatherings with Friends and Family:    Attends Religious Services:    Active Member of Clubs or Organizations:    Attends Archivist Meetings:    Marital Status:    History reviewed. No pertinent family history. - negative  except otherwise stated in the family history section Allergies  Allergen Reactions   Darvon [Propoxyphene] Other (See Comments)    Lungs filling up with fluid   Iron Dextran     Other reaction(s): Breathing Problems   Lac Bovis     Other reaction(s): Unknown   Lactose Intolerance (Gi)     unknown   Prior to Admission medications   Medication Sig Start Date End Date Taking? Authorizing Provider  acetaminophen (TYLENOL) 650 MG CR tablet Take 650 mg by mouth every 8 (eight) hours as needed for pain (At the time of dialysis).   Yes [provider]  ALPHAGAN P 0.1 % SOLN Place 1 drop into both eyes in the morning and at bedtime.  01/14/20  Yes [provider]  dorzolamide-timolol (COSOPT) 22.3-6.8 MG/ML ophthalmic solution Place 1 drop into both eyes 2 (two) times daily.  02/05/20  Yes [provider]  ibuprofen (ADVIL) 200 MG tablet Take 600 mg by mouth every 6 (six) hours as needed for moderate pain.   Yes [provider]  LUMIGAN 0.01 % SOLN Place 1 drop into both eyes at bedtime.  01/31/20  Yes [provider]  multivitamin (RENA-VIT) TABS tablet Take 1 tablet by mouth daily.  10/25/14  Yes [provider]  RHOPRESSA 0.02 %  SOLN Place 1 drop into both eyes daily.  01/18/20  Yes [provider]  SENSIPAR 30 MG tablet Take 30 mg by mouth at bedtime. 11/19/19  Yes [provider]  sevelamer carbonate (RENVELA) 800 MG tablet Take 1,600-4,000 mg by mouth See admin instructions. Takes 5 tablets with each meal and 2 tablets snacks. 01/14/20  Yes [provider]  Methoxy PEG-Epoetin Beta (MIRCERA IJ) Mircera 12/30/19 12/28/20  [provider]   MR FOOT RIGHT WO CONTRAST  Result Date: 05/31/2020 CLINICAL DATA:  Right foot wound EXAM: MRI OF THE RIGHT FOREFOOT WITHOUT CONTRAST TECHNIQUE: Multiplanar, multisequence MR imaging of the right was performed. No intravenous contrast was administered. COMPARISON:  None.  FINDINGS: Bones/Joint/Cartilage There is increased T2 hyperintense signal with subtle T1 hypointensity involving the fifth metatarsal head. There is slight cortical irregularity seen laterally at the fifth metatarsal head. Normal osseous marrow signal seen throughout the remainder of the osseous structures. No large joint effusions. Ligaments The Lisfranc ligaments are intact. Muscles and Tendons Diffusely increased T2 hyperintense signal seen throughout the muscles. The flexor and extensor tendons are intact. The plantar fascia is intact. Soft tissues Along the dorsum and lateral aspect of the fifth metatarsal head there is subtle area of skin irregularity. A small loculated fluid collection is noted overlying the fifth metatarsal head which measures 1.7 x 0.6 x 1.5 cm. Subcutaneous edema seen overlying the dorsum of foot. IMPRESSION: Area of superficial ulceration with a probable soft tissue abscess overlying the fifth metatarsal head measuring 1.7 x 0.6 x 1.5 cm. Findings suggestive of osteomyelitis involving the fifth metatarsal head. No other acute osseous abnormalities. Electronically Signed   By: Prudencio Pair M.D.   On: 05/31/2020 05:16   DG Foot Complete Right  Result Date: 05/30/2020 CLINICAL DATA:  Fifth toe infection EXAM: RIGHT FOOT COMPLETE - 3+ VIEW COMPARISON:  None. FINDINGS: Vascular calcifications in the right foot. There is lucency in the 5th metatarsal head concerning for possible acute osteomyelitis. No fracture, subluxation or dislocation. IMPRESSION: Lucency within the 5th metatarsal head concerning for osteomyelitis. Electronically Signed   By: Rolm Baptise M.D.   On: 05/30/2020 21:39   - pertinent xrays, CT, MRI studies were reviewed and independently interpreted  Positive ROS: All other systems have been reviewed and were otherwise negative with the exception of those mentioned in the HPI and as above.  Physical Exam: General: Alert, no acute distress Psychiatric: Patient is  competent for consent with normal mood and affect Lymphatic: No axillary or cervical lymphadenopathy Cardiovascular: No pedal edema Respiratory: No cyanosis, no use of accessory musculature GI: No organomegaly, abdomen is soft and non-tender    Images:  @ENCIMAGES @  Labs:  No results found for: HGBA1C, ESRSEDRATE, CRP, LABURIC, REPTSTATUS, GRAMSTAIN, CULT, LABORGA  Lab Results  Component Value Date   ALBUMIN 3.2 (L) 05/30/2020    Neurologic: Patient does not have protective sensation bilateral lower extremities.   MUSCULOSKELETAL:   Skin: Examination patient has swelling and resolving cellulitis right forefoot laterally.  There is an ulcer in the fourth webspace.  Patient has a strong dorsalis pedis pulse bilaterally patient also has a palpable posterior tibial pulse.  Review of the radiographs shows the bony changes of the fifth metatarsal head.  Review of the MRI scan shows osteomyelitis involving the entire fifth metatarsal head without abscess.  Assessment: Assessment: End-stage renal disease on dialysis with osteomyelitis right foot fifth metatarsal head.    Plan: Plan: Patient is eating breakfast now, he states  he has not eaten in over a day.  Will plan for continue IV antibiotics with fifth ray amputation on Friday.  Anticipate patient can be discharged a day or 2 after surgery.  Thank you for the consult and the opportunity to see Timothy Ponce, [place] [phone] 7:44 AM

## 2020-05-31 NOTE — Progress Notes (Addendum)
Pharmacy Antibiotic Note  Timothy Ponce is a 64 y.o. male admitted on 05/30/2020 with osteomyelitis of R 5th metatarsal head. Planning amputation on 6/18.  Pharmacy has been consulted for Vancomycin dosing.    Vancomycin 1gm IV given at 3am today.   ESRD, usual TTS- HD, last 6/15 as outpatient.  Plan:  Vancomycin 500 mg IV after HD-TTS.  Expect next HD on schedule on 6/17.  Will follow for HD plans and adjust timing of Vanc doses as needed.  Height: 5' 8.75" (174.6 cm) Weight: 59.6 kg (131 lb 6.3 oz) IBW/kg (Calculated) : 70.13  Temp (24hrs), Avg:98 F (36.7 C), Min:97.6 F (36.4 C), Max:99.2 F (37.3 C)  Recent Labs  Lab 05/30/20 1417 05/30/20 1604 05/31/20 0751  WBC 6.9  --  6.7  CREATININE 5.72*  --  7.63*  LATICACIDVEN 1.0 1.2  --      Allergies  Allergen Reactions  . Darvon [Propoxyphene] Other (See Comments)    Lungs filling up with fluid  . Iron Dextran     Other reaction(s): Breathing Problems  . Lac Bovis     Other reaction(s): Unknown  . Lactose Intolerance (Gi)     unknown    Antimicrobials this admission:  Vancomycin 6/16 >>  Microbiology results:  no cultures  Thank you for allowing pharmacy to be a part of this patient's care.  Arty Baumgartner, [place] Phone: [phone] 05/31/2020 10:28 AM

## 2020-05-31 NOTE — Consult Note (Signed)
ORTHOPAEDIC CONSULTATION  REQUESTING PHYSICIAN: Lucious Groves, DO  Chief Complaint: Pain swelling and fifth metatarsal  HPI: Timothy Ponce is a 64 y.o. male who presents with osteomyelitis of the fifth metatarsal head with draining ulcer.  Patient has end-stage renal disease on dialysis etiology unknown.  Patient does not have hypertension or diabetes.  No history of tobacco use.  Past Medical History:  Diagnosis Date  . Chronic kidney disease   . Glaucoma    Past Surgical History:  Procedure Laterality Date  . AV FISTULA PLACEMENT     Social History   Socioeconomic History  . Marital status: Single    Spouse name: Not on file  . Number of children: Not on file  . Years of education: Not on file  . Highest education level: Not on file  Occupational History  . Not on file  Tobacco Use  . Smoking status: Never Smoker  . Smokeless tobacco: Never Used  Vaping Use  . Vaping Use: Never used  Substance and Sexual Activity  . Alcohol use: Not Currently  . Drug use: Never  . Sexual activity: Not on file  Other Topics Concern  . Not on file  Social History Narrative  . Not on file   Social Determinants of Health   Financial Resource Strain:   . Difficulty of Paying Living Expenses:   Food Insecurity:   . Worried About Charity fundraiser in the Last Year:   . Arboriculturist in the Last Year:   Transportation Needs:   . Film/video editor (Medical):   Marland Kitchen Lack of Transportation (Non-Medical):   Physical Activity:   . Days of Exercise per Week:   . Minutes of Exercise per Session:   Stress:   . Feeling of Stress :   Social Connections:   . Frequency of Communication with Friends and Family:   . Frequency of Social Gatherings with Friends and Family:   . Attends Religious Services:   . Active Member of Clubs or Organizations:   . Attends Archivist Meetings:   Marland Kitchen Marital Status:    History reviewed. No pertinent family history. - negative  except otherwise stated in the family history section Allergies  Allergen Reactions  . Darvon [Propoxyphene] Other (See Comments)    Lungs filling up with fluid  . Iron Dextran     Other reaction(s): Breathing Problems  . Lac Bovis     Other reaction(s): Unknown  . Lactose Intolerance (Gi)     unknown   Prior to Admission medications   Medication Sig Start Date End Date Taking? Authorizing Provider  acetaminophen (TYLENOL) 650 MG CR tablet Take 650 mg by mouth every 8 (eight) hours as needed for pain (At the time of dialysis).   Yes [provider]  ALPHAGAN P 0.1 % SOLN Place 1 drop into both eyes in the morning and at bedtime.  01/14/20  Yes [provider]  dorzolamide-timolol (COSOPT) 22.3-6.8 MG/ML ophthalmic solution Place 1 drop into both eyes 2 (two) times daily.  02/05/20  Yes [provider]  ibuprofen (ADVIL) 200 MG tablet Take 600 mg by mouth every 6 (six) hours as needed for moderate pain.   Yes [provider]  LUMIGAN 0.01 % SOLN Place 1 drop into both eyes at bedtime.  01/31/20  Yes [provider]  multivitamin (RENA-VIT) TABS tablet Take 1 tablet by mouth daily.  10/25/14  Yes [provider]  RHOPRESSA 0.02 %  SOLN Place 1 drop into both eyes daily.  01/18/20  Yes [provider]  SENSIPAR 30 MG tablet Take 30 mg by mouth at bedtime. 11/19/19  Yes [provider]  sevelamer carbonate (RENVELA) 800 MG tablet Take 1,600-4,000 mg by mouth See admin instructions. Takes 5 tablets with each meal and 2 tablets snacks. 01/14/20  Yes [provider]  Methoxy PEG-Epoetin Beta (MIRCERA IJ) Mircera 12/30/19 12/28/20  [provider]   MR FOOT RIGHT WO CONTRAST  Result Date: 05/31/2020 CLINICAL DATA:  Right foot wound EXAM: MRI OF THE RIGHT FOREFOOT WITHOUT CONTRAST TECHNIQUE: Multiplanar, multisequence MR imaging of the right was performed. No intravenous contrast was administered. COMPARISON:  None.  FINDINGS: Bones/Joint/Cartilage There is increased T2 hyperintense signal with subtle T1 hypointensity involving the fifth metatarsal head. There is slight cortical irregularity seen laterally at the fifth metatarsal head. Normal osseous marrow signal seen throughout the remainder of the osseous structures. No large joint effusions. Ligaments The Lisfranc ligaments are intact. Muscles and Tendons Diffusely increased T2 hyperintense signal seen throughout the muscles. The flexor and extensor tendons are intact. The plantar fascia is intact. Soft tissues Along the dorsum and lateral aspect of the fifth metatarsal head there is subtle area of skin irregularity. A small loculated fluid collection is noted overlying the fifth metatarsal head which measures 1.7 x 0.6 x 1.5 cm. Subcutaneous edema seen overlying the dorsum of foot. IMPRESSION: Area of superficial ulceration with a probable soft tissue abscess overlying the fifth metatarsal head measuring 1.7 x 0.6 x 1.5 cm. Findings suggestive of osteomyelitis involving the fifth metatarsal head. No other acute osseous abnormalities. Electronically Signed   By: Prudencio Pair M.D.   On: 05/31/2020 05:16   DG Foot Complete Right  Result Date: 05/30/2020 CLINICAL DATA:  Fifth toe infection EXAM: RIGHT FOOT COMPLETE - 3+ VIEW COMPARISON:  None. FINDINGS: Vascular calcifications in the right foot. There is lucency in the 5th metatarsal head concerning for possible acute osteomyelitis. No fracture, subluxation or dislocation. IMPRESSION: Lucency within the 5th metatarsal head concerning for osteomyelitis. Electronically Signed   By: Rolm Baptise M.D.   On: 05/30/2020 21:39   - pertinent xrays, CT, MRI studies were reviewed and independently interpreted  Positive ROS: All other systems have been reviewed and were otherwise negative with the exception of those mentioned in the HPI and as above.  Physical Exam: General: Alert, no acute distress Psychiatric: Patient is  competent for consent with normal mood and affect Lymphatic: No axillary or cervical lymphadenopathy Cardiovascular: No pedal edema Respiratory: No cyanosis, no use of accessory musculature GI: No organomegaly, abdomen is soft and non-tender    Images:  @ENCIMAGES @  Labs:  No results found for: HGBA1C, ESRSEDRATE, CRP, LABURIC, REPTSTATUS, GRAMSTAIN, CULT, LABORGA  Lab Results  Component Value Date   ALBUMIN 3.2 (L) 05/30/2020    Neurologic: Patient does not have protective sensation bilateral lower extremities.   MUSCULOSKELETAL:   Skin: Examination patient has swelling and resolving cellulitis right forefoot laterally.  There is an ulcer in the fourth webspace.  Patient has a strong dorsalis pedis pulse bilaterally patient also has a palpable posterior tibial pulse.  Review of the radiographs shows the bony changes of the fifth metatarsal head.  Review of the MRI scan shows osteomyelitis involving the entire fifth metatarsal head without abscess.  Assessment: Assessment: End-stage renal disease on dialysis with osteomyelitis right foot fifth metatarsal head.    Plan: Plan: Patient is eating breakfast now, he states  he has not eaten in over a day.  Will plan for continue IV antibiotics with fifth ray amputation on Friday.  Anticipate patient can be discharged a day or 2 after surgery.  Thank you for the consult and the opportunity to see Timothy Ponce, [place] [phone] 7:44 AM

## 2020-06-01 ENCOUNTER — Encounter (HOSPITAL_COMMUNITY): Payer: Medicare Other

## 2020-06-01 DIAGNOSIS — D509 Iron deficiency anemia, unspecified: Secondary | ICD-10-CM

## 2020-06-01 LAB — RENAL FUNCTION PANEL
Albumin: 2.7 g/dL — ABNORMAL LOW (ref 3.5–5.0)
Anion gap: 13 (ref 5–15)
BUN: 24 mg/dL — ABNORMAL HIGH (ref 8–23)
CO2: 31 mmol/L (ref 22–32)
Calcium: 9.5 mg/dL (ref 8.9–10.3)
Chloride: 90 mmol/L — ABNORMAL LOW (ref 98–111)
Creatinine, Ser: 10.27 mg/dL — ABNORMAL HIGH (ref 0.61–1.24)
GFR calc Af Amer: 5 mL/min — ABNORMAL LOW (ref >60)
GFR calc non Af Amer: 5 mL/min — ABNORMAL LOW (ref >60)
Glucose, Bld: 76 mg/dL (ref 70–99)
Phosphorus: 4 mg/dL (ref 2.5–4.6)
Potassium: 4.9 mmol/L (ref 3.5–5.1)
Sodium: 134 mmol/L — ABNORMAL LOW (ref 135–145)

## 2020-06-01 LAB — HEMOGLOBIN A1C
Hgb A1c MFr Bld: 4.9 % (ref 4.8–5.6)
Mean Plasma Glucose: 94 mg/dL

## 2020-06-01 LAB — CBC
HCT: 27.5 % — ABNORMAL LOW (ref 39.0–52.0)
Hemoglobin: 8.7 g/dL — ABNORMAL LOW (ref 13.0–17.0)
MCH: 33.1 pg (ref 26.0–34.0)
MCHC: 31.6 g/dL (ref 30.0–36.0)
MCV: 104.6 fL — ABNORMAL HIGH (ref 80.0–100.0)
Platelets: 255 10*3/uL (ref 150–400)
RBC: 2.63 MIL/uL — ABNORMAL LOW (ref 4.22–5.81)
RDW: 15.8 % — ABNORMAL HIGH (ref 11.5–15.5)
WBC: 5.9 10*3/uL (ref 4.0–10.5)
nRBC: 0.3 % — ABNORMAL HIGH (ref 0.0–0.2)

## 2020-06-01 MED ORDER — HYDROMORPHONE HCL 1 MG/ML IJ SOLN
1.0000 mg | INTRAMUSCULAR | Status: DC | PRN
  Administered 2020-06-02: 1 mg via INTRAVENOUS
  Filled 2020-06-01 (×2): qty 1

## 2020-06-01 MED ORDER — HEPARIN SODIUM (PORCINE) 1000 UNIT/ML DIALYSIS
1800.0000 [IU] | INTRAMUSCULAR | Status: DC | PRN
  Filled 2020-06-01: qty 2

## 2020-06-01 MED ORDER — HEPARIN SODIUM (PORCINE) 5000 UNIT/ML IJ SOLN
5000.0000 [IU] | Freq: Three times a day (TID) | INTRAMUSCULAR | Status: AC
  Administered 2020-06-01: 5000 [IU] via SUBCUTANEOUS
  Filled 2020-06-01 (×3): qty 1

## 2020-06-01 MED ORDER — VANCOMYCIN HCL IN DEXTROSE 500-5 MG/100ML-% IV SOLN
INTRAVENOUS | Status: AC
  Filled 2020-06-01: qty 100

## 2020-06-01 MED ORDER — SENNOSIDES-DOCUSATE SODIUM 8.6-50 MG PO TABS
1.0000 | ORAL_TABLET | Freq: Two times a day (BID) | ORAL | Status: DC
  Administered 2020-06-01 – 2020-06-07 (×11): 1 via ORAL
  Filled 2020-06-01 (×10): qty 1

## 2020-06-01 MED ORDER — CEFAZOLIN SODIUM-DEXTROSE 2-4 GM/100ML-% IV SOLN
2.0000 g | INTRAVENOUS | Status: AC
  Administered 2020-06-02: 2 g via INTRAVENOUS
  Filled 2020-06-01 (×2): qty 100

## 2020-06-01 MED ORDER — CINACALCET HCL 30 MG PO TABS
30.0000 mg | ORAL_TABLET | Freq: Every day | ORAL | Status: DC
  Administered 2020-06-01 – 2020-06-07 (×7): 30 mg via ORAL
  Filled 2020-06-01 (×7): qty 1

## 2020-06-01 MED ORDER — HEPARIN SODIUM (PORCINE) 1000 UNIT/ML IJ SOLN
INTRAMUSCULAR | Status: AC
  Administered 2020-06-01: 1000 [IU]
  Filled 2020-06-01: qty 2

## 2020-06-01 NOTE — Progress Notes (Signed)
NAME:  STAFFORD RIVIERA, MRN:  [id number], DOB:  [birth year], LOS: 2 ADMISSION DATE:  05/30/2020  Subjective  No overnight event. Reports pain well controlled. Discussed pre-op med adjustments and NPO after MN  Significant Hospital Events   6/15 admission 6/16 MRI showing osteomyelitis with abscess formation. Dr. Sharol Given planning for surgery on 6/18 6/17 HD UF 500cc  Objective   Blood pressure (!) 109/55, pulse 61, temperature 98.2 F (36.8 C), temperature source Oral, resp. rate 16, height 5' 8.75" (1.746 m), weight 61.7 kg, SpO2 98 %.     Intake/Output Summary (Last 24 hours) at 06/01/2020 1156 Last data filed at 06/01/2020 1055 Gross per 24 hour  Intake --  Output 500 ml  Net -500 ml    Examination: GENERAL: in no acute distress CARDIAC: heart RRR. No peripheral edema.  PULMONARY: breathing comfortably on room air. Lungs clear SKIN: no increase in erythema  Consults:  ortho  Significant Diagnostic Tests:  R foot xray>>lucency in the 5th metatarsal head concerning for possible acute osteomyelitis.  R foot MRI>>area of superfiical ulceration with probable soft tissue abscess overlying the 5th metatarsal head measuring 1.7x0.6x1.5cm. findings suggestive of osteomyelitis involving the 5th metatarsal head. ABIs>>  Micro Data:  6/15 blood culture x2>>NGTD  Antimicrobials:  Vancomycin 6/15>>  Summary  64 yo male with ESRD on HD (THS) who was admitted to [facility] 05/30/20 for 3w history of right foot pain. Xray in the ED concerning for osteomyelitis. Orthopedics was consulted and are planning 5th ray amputation on 6/18. We are continuing abx treatment with vancomycin in the meantime.  Assessment & Plan:  Principal Problem:   Osteomyelitis of fifth toe of right foot (HCC) Active Problems:   ESRD on hemodialysis (HCC)   Anemia of chronic renal failure   Peripheral arterial disease (HCC)  Osteomyelitis of the 5th metatarsal head with soft tissue abscess. As seen on MRI.   Remains afebrile. NGTD on blood cultures. A1C 4.9 Plan Continue IV vancomycin with HD adjustments. Follow blood cultures Appreciate ortho recs: planning for 5th ray amputation on 6/18. NPO at MN. Hold heparin tomorrow am. Pain management: Prn po dilaudid, Senakot bid PT/OT Follow up ABI results.  ESRD on HD (THS). Management per nephrology. Monitor renal function. 0.5L off at HD today.  Macrocytic anemia. MCV 105. Hgb stable. B12 normal. Continue to monitor. Transfuse for hgb <7.  Best practice:  CODE STATUS: FULL Diet: renal. NPO at MN DVT for prophylaxis: heparin subcut. Hold tomorrow morning in anticipation of OR Social considerations/Family communication: per pt Dispo: pending further evaluation and management   Mitzi Hansen, MD [place] PGY-1 Contact info:  Please page me at [phone] from 7am-5pm M-F.  Please use the [facility] after hours pager at [phone] after 5pm M-F and on weekends 06/01/20  11:56 AM  Labs    CBC Latest Ref Rng & Units 06/01/2020 05/31/2020 05/30/2020  WBC 4.0 - 10.5 K/uL 5.9 6.7 6.9  Hemoglobin 13.0 - 17.0 g/dL 8.7(L) 9.9(L) 9.4(L)  Hematocrit 39 - 52 % 27.5(L) 31.3(L) 30.7(L)  Platelets 150 - 400 K/uL 255 267 259   BMP Latest Ref Rng & Units 06/01/2020 05/31/2020 05/30/2020  Glucose 70 - 99 mg/dL 76 92 93  BUN 8 - 23 mg/dL 24(H) 16 7(L)  Creatinine 0.61 - 1.24 mg/dL 10.27(H) 7.63(H) 5.72(H)  Sodium 135 - 145 mmol/L 134(L) 135 138  Potassium 3.5 - 5.1 mmol/L 4.9 4.6 3.8  Chloride 98 - 111 mmol/L 90(L) 93(L) 93(L)  CO2 22 -  32 mmol/L 31 30 33(H)  Calcium 8.9 - 10.3 mg/dL 9.5 9.4 8.6(L)

## 2020-06-01 NOTE — Progress Notes (Signed)
OT Cancellation Note  Patient Details Name: Timothy Ponce MRN: [id number] DOB: 1956-03-28   Cancelled Treatment:    Reason Eval/Treat Not Completed: Patient at procedure or test/ unavailable Pt currently off unit at HD. OT will return as time allows and pt is appropriate.   [facility] OTR/L Acute Rehabilitation Services Office: [place] 06/01/2020, 8:55 AM

## 2020-06-01 NOTE — Progress Notes (Signed)
[facility] Progress Note   Subjective:   Patient seen on HD.  Tolerating well.  Denies shortness of breath, chest pain, palpitation, dizziness, abdominal pain, nausea, vomiting.  Objective Vitals:   06/01/20 0730 06/01/20 0800 06/01/20 0830 06/01/20 0900  BP: 128/62 (!) 107/54 (!) 101/54 (!) 96/40  Pulse: (!) 57 (!) 58 (!) 56 66  Resp: 16 16 16 16   Temp:      TempSrc:      SpO2:      Weight:      Height:       Physical Exam General:  Well developed male in NAD Heart: RRR, no murmurs, rubs or gallops Lungs: CTA b/l without wheezing, rhonchi or rales Abdomen: Soft, non-tender, non-distended. +BS Extremities: No edema b/l lower extremities Dialysis Access:  RUE AVF currently accessed  Additional Objective Labs: Basic Metabolic Panel: Recent Labs  Lab 05/30/20 1417 05/31/20 0751  NA 138 135  K 3.8 4.6  CL 93* 93*  CO2 33* 30  GLUCOSE 93 92  BUN 7* 16  CREATININE 5.72* 7.63*  CALCIUM 8.6* 9.4  PHOS  --  3.6   Liver Function Tests: Recent Labs  Lab 05/30/20 1417 05/31/20 0751  AST 17  --   ALT 19  --   ALKPHOS 96  --   BILITOT 0.8  --   PROT 7.3  --   ALBUMIN 3.2* 2.9*   CBC: Recent Labs  Lab 05/30/20 1417 05/31/20 0751 06/01/20 0725  WBC 6.9 6.7 5.9  NEUTROABS 4.5  --   --   HGB 9.4* 9.9* 8.7*  HCT 30.7* 31.3* 27.5*  MCV 107.3* 105.4* 104.6*  PLT 259 267 255    Studies/Results: MR FOOT RIGHT WO CONTRAST  Result Date: 05/31/2020 CLINICAL DATA:  Right foot wound EXAM: MRI OF THE RIGHT FOREFOOT WITHOUT CONTRAST TECHNIQUE: Multiplanar, multisequence MR imaging of the right was performed. No intravenous contrast was administered. COMPARISON:  None. FINDINGS: Bones/Joint/Cartilage There is increased T2 hyperintense signal with subtle T1 hypointensity involving the fifth metatarsal head. There is slight cortical irregularity seen laterally at the fifth metatarsal head. Normal osseous marrow signal seen throughout the remainder of the  osseous structures. No large joint effusions. Ligaments The Lisfranc ligaments are intact. Muscles and Tendons Diffusely increased T2 hyperintense signal seen throughout the muscles. The flexor and extensor tendons are intact. The plantar fascia is intact. Soft tissues Along the dorsum and lateral aspect of the fifth metatarsal head there is subtle area of skin irregularity. A small loculated fluid collection is noted overlying the fifth metatarsal head which measures 1.7 x 0.6 x 1.5 cm. Subcutaneous edema seen overlying the dorsum of foot. IMPRESSION: Area of superficial ulceration with a probable soft tissue abscess overlying the fifth metatarsal head measuring 1.7 x 0.6 x 1.5 cm. Findings suggestive of osteomyelitis involving the fifth metatarsal head. No other acute osseous abnormalities. Electronically Signed   By: Prudencio Pair M.D.   On: 05/31/2020 05:16   DG Foot Complete Right  Result Date: 05/30/2020 CLINICAL DATA:  Fifth toe infection EXAM: RIGHT FOOT COMPLETE - 3+ VIEW COMPARISON:  None. FINDINGS: Vascular calcifications in the right foot. There is lucency in the 5th metatarsal head concerning for possible acute osteomyelitis. No fracture, subluxation or dislocation. IMPRESSION: Lucency within the 5th metatarsal head concerning for osteomyelitis. Electronically Signed   By: Rolm Baptise M.D.   On: 05/30/2020 21:39   Medications:  . brimonidine  1 drop Both Eyes BID  . Chlorhexidine Gluconate Cloth  6 each Topical Q0600  . cinacalcet  30 mg Oral QHS  . dorzolamide-timolol  1 drop Both Eyes BID  . heparin  5,000 Units Subcutaneous Q8H  . latanoprost  1 drop Both Eyes QHS  . multivitamin  1 tablet Oral Q lunch  . Netarsudil Dimesylate  1 drop Both Eyes Daily  . senna-docusate  1 tablet Oral BID  . sevelamer carbonate  4,000 mg Oral TID WC  . sodium chloride flush  3 mL Intravenous Once  . vancomycin      . vancomycin  500 mg Intravenous Q T,Th,Sa-HD    Dialysis Orders: Center:  [facility] on TTS. TTS 180NRe, BFR 400/DFR 800, EDW 60kg, 2K/2Ca, time 3.5hr,  RUE AVF, heparin 1800 unit bolus mircera 116mcg IVP q 2 weeks- last dose 69mcg on 05/27/20 Calcitriol 1.75 mcg PO q HD Sensipar30mg  daily Binder: nevela 5 tabs TID with meals  Assessment/Plan: 1.  Osteomyelitis: Seen by ortho, on IV vanc and planned for 5th ray amputation on Friday.  2.  ESRD:  TTS schedule, no indication for urgent HD today. Plan next HD tomorrow per regular schedule. Please avoid gadolium contrast and use of RUE for BP/blood draws as possible. We can draw labs on dialysis on HD days per pt request.   Patient does need assistance removing tape from AVF after dialysis due to vision impairment.  Advised we can remove dressing while rounding in the morning. 3.  Hypertension/vonilume: BP controlled, below EDW and not volume overloaded on exam. Not on BP meds. BP tends to run low during dialysis. 4.  Anemia: Hgb 9.9 > 8.7 , ESA dose recently increased and due 6/26. No IV Fe due to acute infection.  5.  Metabolic bone disease: Corr calcium high, hold calctriol and follow. Phos 3.6. Continue sensipar and binder.  6.  Nutrition:  Renal diet with fluid restrictions  Anice Paganini, PA-C 06/01/2020, 9:28 AM  [facility] Pager: [phone]

## 2020-06-01 NOTE — Evaluation (Signed)
Physical Therapy Evaluation Patient Details Name: Timothy Ponce MRN: [id number] DOB: 08-21-1956 Today's Date: 06/01/2020   History of Present Illness  Pt is a 64 yo male presenting with osteomyelitis of his fifth ray with planned amputation on 6/18. 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  Clinical Impression  Pt in bed upon arrival of PT, agreeable to evaluation at this time. Prior to admission the pt was independent with mobility, but reports progressive limitations due to pain. Otherwise, the pt is generally independent with self-care, is legally blind, works as a professor, and lives with his 64 yo mother. The pt now presents with limitations in functional mobility, strength, and stability due to above dx and resulting pain, and will continue to benefit from skilled PT to address these deficits. The pt was able to demo good capacity for bed mobility and transfers today, good functional strength in BLE with MMT and transfers, and good initial steps with use of RW due to pain in RLE. The pt will continue to benefit from skilled PT following scheduled amputation to further progress functional gait training and assess need for equipment based on the pt's mobility following surgery.      Follow Up Recommendations Home health PT;Supervision/Assistance - 24 hour (re-assess following surgery on 6/18)    Equipment Recommendations   (re-assess following surgery on 6/18)    Recommendations for Other Services       Precautions / Restrictions Precautions Precautions: Fall Precaution Comments: glaucoma, legally blind Restrictions Weight Bearing Restrictions: No      Mobility  Bed Mobility Overal bed mobility: Modified Independent             General bed mobility comments: extra time, no assist needed  Transfers Overall transfer level: Needs assistance Equipment used: Rolling walker (2  wheeled) Transfers: Sit to/from Stand Sit to Stand: Min guard         General transfer comment: minG to rise from bed, VC for hand placement, pt predominantly using LLE due to pain in RLE with WB  Ambulation/Gait Ambulation/Gait assistance: Min guard Gait Distance (Feet): 3 Feet Assistive device: Rolling walker (2 wheeled)     Gait velocity interpretation: <1.31 ft/sec, indicative of household ambulator General Gait Details: hop-to pattern with RW, lateral steps without WB on RLE due to pain    Balance Overall balance assessment: Mild deficits observed, not formally tested                Pertinent Vitals/Pain Pain Assessment: 0-10 Pain Score: 2  Pain Location: at rest 2/10, pt reports significant pain with any touching of pinky toe Pain Descriptors / Indicators: Sore Pain Intervention(s): Limited activity within patient's tolerance;Monitored during session;Repositioned    Home Living Family/patient expects to be discharged to:: Private residence Living Arrangements: Parent (54 yo mother) Available Help at Discharge: Family;Available PRN/intermittently (pt mother could assist some) Type of Home: House Home Access: Stairs to enter Entrance Stairs-Rails: Chemical engineer of Steps: 4 Home Layout: Two level Home Equipment: Crutches;Cane - quad;Wheelchair - manual Additional Comments: pt reports he may have access to crutches and a cane but does not know what kind    Prior Function Level of Independence: Independent     Comments: pt reports independent with mobility and ADLs, pain limited reccently. legally blind, uses [employer] for transportation     Hand Dominance   Dominant Hand: Right    Extremity/Trunk Assessment  Upper Extremity Assessment Upper Extremity Assessment: RUE deficits/detail;LUE deficits/detail RUE Deficits / Details: Pt with reports of bilaterally decreased sensation (LT) in hands, unable to complete fist bilaterally, other motions  WFL LUE Deficits / Details: Pt with reports of bilaterally decreased sensation (LT) in hands, unable to complete fist bilaterally, other motions [facility]    Lower Extremity Assessment Lower Extremity Assessment: Overall WFL for tasks assessed (Pt reports pain in R pinky toe area with touching, able to move RLE well)    Cervical / Trunk Assessment Cervical / Trunk Assessment: Normal  Communication   Communication: No difficulties  Cognition Arousal/Alertness: Awake/alert Behavior During Therapy: WFL for tasks assessed/performed Overall Cognitive Status: Within Functional Limits for tasks assessed            General Comments General comments (skin integrity, edema, etc.): Pt with swelling in B hands, R pinky toe area.     Assessment/Plan    PT Assessment Patient needs continued PT services  PT Problem List Decreased mobility;Decreased activity tolerance;Decreased balance;Pain;Impaired sensation       PT Treatment Interventions DME instruction;Therapeutic exercise;Gait training;Stair training;Functional mobility training;Therapeutic activities;Patient/family education;Balance training    PT Goals (Current goals can be found in the Care Plan section)  Acute Rehab PT Goals Patient Stated Goal: return home PT Goal Formulation: With patient Time For Goal Achievement: 06/15/20 Potential to Achieve Goals: Good    Frequency Min 3X/week   Barriers to discharge   pt lives with 66 yo mother who would not be able to physically assist him       AM-PAC PT "6 Clicks" Mobility  Outcome Measure Help needed turning from your back to your side while in a flat bed without using bedrails?: None Help needed moving from lying on your back to sitting on the side of a flat bed without using bedrails?: None Help needed moving to and from a bed to a chair (including a wheelchair)?: A Little Help needed standing up from a chair using your arms (e.g., wheelchair or bedside chair)?: A Little Help  needed to walk in hospital room?: A Little Help needed climbing 3-5 steps with a railing? : A Lot 6 Click Score: 19    End of Session Equipment Utilized During Treatment:  (none) Activity Tolerance: Patient tolerated treatment well;Patient limited by pain Patient left: in bed;with call bell/phone within reach Nurse Communication: Mobility status PT Visit Diagnosis: Difficulty in walking, not elsewhere classified (R26.2);Pain Pain - Right/Left: Right Pain - part of body: Ankle and joints of foot    Time: 1452-1534 PT Time Calculation (min) (ACUTE ONLY): 42 min   Charges:   PT Evaluation $PT Eval Moderate Complexity: 1 Mod PT Treatments $Gait Training: 8-22 mins $Therapeutic Activity: 8-22 mins        Karma Ganja, PT, DPT   Acute Rehabilitation Department Pager #: [phone]  Otho Bellows 06/01/2020, 5:08 PM

## 2020-06-02 ENCOUNTER — Encounter (HOSPITAL_COMMUNITY)
Admission: EM | Disposition: A | Discharge status: Skilled Nursing Facility | Payer: Self-pay | Source: Home / Self Care | Attending: Internal Medicine

## 2020-06-02 ENCOUNTER — Inpatient Hospital Stay (HOSPITAL_COMMUNITY): Payer: Medicare Other | Admitting: Anesthesiology

## 2020-06-02 ENCOUNTER — Inpatient Hospital Stay (HOSPITAL_COMMUNITY): Payer: Medicare Other

## 2020-06-02 ENCOUNTER — Encounter (HOSPITAL_COMMUNITY): Payer: Self-pay | Admitting: Internal Medicine

## 2020-06-02 ENCOUNTER — Encounter (HOSPITAL_COMMUNITY): Payer: Medicare Other

## 2020-06-02 DIAGNOSIS — M869 Osteomyelitis, unspecified: Secondary | ICD-10-CM

## 2020-06-02 DIAGNOSIS — I739 Peripheral vascular disease, unspecified: Secondary | ICD-10-CM

## 2020-06-02 HISTORY — PX: AMPUTATION: SHX166

## 2020-06-02 LAB — RENAL FUNCTION PANEL
Albumin: 2.6 g/dL — ABNORMAL LOW (ref 3.5–5.0)
Anion gap: 11 (ref 5–15)
BUN: 16 mg/dL (ref 8–23)
CO2: 29 mmol/L (ref 22–32)
Calcium: 9.1 mg/dL (ref 8.9–10.3)
Chloride: 94 mmol/L — ABNORMAL LOW (ref 98–111)
Creatinine, Ser: 7.55 mg/dL — ABNORMAL HIGH (ref 0.61–1.24)
GFR calc Af Amer: 8 mL/min — ABNORMAL LOW (ref >60)
GFR calc non Af Amer: 7 mL/min — ABNORMAL LOW (ref >60)
Glucose, Bld: 75 mg/dL (ref 70–99)
Phosphorus: 3.8 mg/dL (ref 2.5–4.6)
Potassium: 4.3 mmol/L (ref 3.5–5.1)
Sodium: 134 mmol/L — ABNORMAL LOW (ref 135–145)

## 2020-06-02 LAB — CBC
HCT: 26.6 % — ABNORMAL LOW (ref 39.0–52.0)
Hemoglobin: 8.4 g/dL — ABNORMAL LOW (ref 13.0–17.0)
MCH: 33.2 pg (ref 26.0–34.0)
MCHC: 31.6 g/dL (ref 30.0–36.0)
MCV: 105.1 fL — ABNORMAL HIGH (ref 80.0–100.0)
Platelets: 221 10*3/uL (ref 150–400)
RBC: 2.53 MIL/uL — ABNORMAL LOW (ref 4.22–5.81)
RDW: 15.8 % — ABNORMAL HIGH (ref 11.5–15.5)
WBC: 5.3 10*3/uL (ref 4.0–10.5)
nRBC: 0 % (ref 0.0–0.2)

## 2020-06-02 SURGERY — AMPUTATION, FOOT, RAY
Anesthesia: Regional | Laterality: Right

## 2020-06-02 MED ORDER — MIDAZOLAM HCL 2 MG/2ML IJ SOLN: 1.0000 mg | Freq: Once | INTRAMUSCULAR | Status: AC

## 2020-06-02 MED ORDER — METOCLOPRAMIDE HCL 5 MG/ML IJ SOLN: 5.0000 mg | Freq: Three times a day (TID) | INTRAMUSCULAR | Status: DC | PRN

## 2020-06-02 MED ORDER — FENTANYL CITRATE (PF) 100 MCG/2ML IJ SOLN: 50.0000 ug | Freq: Once | INTRAMUSCULAR | Status: AC

## 2020-06-02 MED ORDER — FENTANYL CITRATE (PF) 250 MCG/5ML IJ SOLN
INTRAMUSCULAR | Status: AC
  Filled 2020-06-02: qty 5

## 2020-06-02 MED ORDER — ACETAMINOPHEN 500 MG PO TABS
ORAL_TABLET | ORAL | Status: AC
  Administered 2020-06-02: 1000 mg via ORAL
  Filled 2020-06-02: qty 2

## 2020-06-02 MED ORDER — CHLORHEXIDINE GLUCONATE 0.12 % MT SOLN: 15.0000 mL | Freq: Once | OROMUCOSAL | Status: DC

## 2020-06-02 MED ORDER — SODIUM CHLORIDE 0.9 % IV SOLN: INTRAVENOUS | Status: DC

## 2020-06-02 MED ORDER — PROPOFOL 500 MG/50ML IV EMUL
INTRAVENOUS | Status: DC | PRN
  Administered 2020-06-02: 50 ug/kg/min via INTRAVENOUS

## 2020-06-02 MED ORDER — HYDROMORPHONE HCL 2 MG PO TABS: 1.0000 mg | ORAL_TABLET | ORAL | Status: DC | PRN

## 2020-06-02 MED ORDER — ONDANSETRON HCL 4 MG/2ML IJ SOLN: 4.0000 mg | Freq: Once | INTRAMUSCULAR | Status: DC | PRN

## 2020-06-02 MED ORDER — PROPOFOL 10 MG/ML IV BOLUS
INTRAVENOUS | Status: AC
  Filled 2020-06-02: qty 20

## 2020-06-02 MED ORDER — CHLORHEXIDINE GLUCONATE 0.12 % MT SOLN
15.0000 mL | Freq: Once | OROMUCOSAL | Status: DC
  Filled 2020-06-02 (×2): qty 15

## 2020-06-02 MED ORDER — MIDAZOLAM HCL 2 MG/2ML IJ SOLN
INTRAMUSCULAR | Status: AC
  Filled 2020-06-02: qty 2

## 2020-06-02 MED ORDER — METOCLOPRAMIDE HCL 5 MG PO TABS: 5.0000 mg | ORAL_TABLET | Freq: Three times a day (TID) | ORAL | Status: DC | PRN

## 2020-06-02 MED ORDER — 0.9 % SODIUM CHLORIDE (POUR BTL) OPTIME
TOPICAL | Status: DC | PRN
  Administered 2020-06-02: 1000 mL

## 2020-06-02 MED ORDER — ACETAMINOPHEN 500 MG PO TABS: 1000.0000 mg | ORAL_TABLET | Freq: Once | ORAL | Status: AC

## 2020-06-02 MED ORDER — ONDANSETRON HCL 4 MG/2ML IJ SOLN: 4.0000 mg | Freq: Four times a day (QID) | INTRAMUSCULAR | Status: DC | PRN

## 2020-06-02 MED ORDER — ORAL CARE MOUTH RINSE: 15.0000 mL | Freq: Once | OROMUCOSAL | Status: DC

## 2020-06-02 MED ORDER — ONDANSETRON HCL 4 MG/2ML IJ SOLN
INTRAMUSCULAR | Status: AC
  Filled 2020-06-02: qty 2

## 2020-06-02 MED ORDER — CLONIDINE HCL (ANALGESIA) 100 MCG/ML EP SOLN
EPIDURAL | Status: DC | PRN
Start: 2020-06-02 — End: 2020-06-02
  Administered 2020-06-02: 50 ug

## 2020-06-02 MED ORDER — PROPOFOL 10 MG/ML IV BOLUS
INTRAVENOUS | Status: DC | PRN
  Administered 2020-06-02: 20 mg via INTRAVENOUS

## 2020-06-02 MED ORDER — MIDAZOLAM HCL 2 MG/2ML IJ SOLN
INTRAMUSCULAR | Status: AC
  Administered 2020-06-02: 1 mg via INTRAVENOUS
  Filled 2020-06-02: qty 2

## 2020-06-02 MED ORDER — ONDANSETRON HCL 4 MG PO TABS: 4.0000 mg | ORAL_TABLET | Freq: Four times a day (QID) | ORAL | Status: DC | PRN

## 2020-06-02 MED ORDER — FENTANYL CITRATE (PF) 100 MCG/2ML IJ SOLN
INTRAMUSCULAR | Status: AC
  Administered 2020-06-02: 50 ug via INTRAVENOUS
  Filled 2020-06-02: qty 2

## 2020-06-02 MED ORDER — ONDANSETRON HCL 4 MG/2ML IJ SOLN
INTRAMUSCULAR | Status: DC | PRN
  Administered 2020-06-02: 4 mg via INTRAVENOUS

## 2020-06-02 MED ORDER — ROPIVACAINE HCL 5 MG/ML IJ SOLN
INTRAMUSCULAR | Status: DC | PRN
Start: 2020-06-02 — End: 2020-06-02
  Administered 2020-06-02: 30 mL via PERINEURAL

## 2020-06-02 MED ORDER — DOCUSATE SODIUM 100 MG PO CAPS
100.0000 mg | ORAL_CAPSULE | Freq: Two times a day (BID) | ORAL | Status: DC
  Administered 2020-06-02 – 2020-06-07 (×10): 100 mg via ORAL
  Filled 2020-06-02 (×10): qty 1

## 2020-06-02 MED ORDER — HYDROMORPHONE HCL 1 MG/ML IJ SOLN
1.0000 mg | INTRAMUSCULAR | Status: DC | PRN
  Administered 2020-06-03 (×2): 1 mg via INTRAVENOUS
  Filled 2020-06-02 (×2): qty 1

## 2020-06-02 MED ORDER — DEXAMETHASONE SODIUM PHOSPHATE 10 MG/ML IJ SOLN
INTRAMUSCULAR | Status: AC
  Filled 2020-06-02: qty 6

## 2020-06-02 MED ORDER — CEFAZOLIN SODIUM-DEXTROSE 1-4 GM/50ML-% IV SOLN: 1.0000 g | Freq: Four times a day (QID) | INTRAVENOUS | Status: DC

## 2020-06-02 MED ORDER — FENTANYL CITRATE (PF) 100 MCG/2ML IJ SOLN: 25.0000 ug | INTRAMUSCULAR | Status: DC | PRN

## 2020-06-02 MED ORDER — EPHEDRINE SULFATE 50 MG/ML IJ SOLN
INTRAMUSCULAR | Status: DC | PRN
Start: 2020-06-02 — End: 2020-06-02
  Administered 2020-06-02: 5 mg via INTRAVENOUS

## 2020-06-02 MED ORDER — PHENYLEPHRINE 40 MCG/ML (10ML) SYRINGE FOR IV PUSH (FOR BLOOD PRESSURE SUPPORT)
PREFILLED_SYRINGE | INTRAVENOUS | Status: AC
  Filled 2020-06-02: qty 10

## 2020-06-02 SURGICAL SUPPLY — 24 items
BLADE SURG 21 STRL SS (BLADE) ×3 IMPLANT
BNDG COHESIVE 4X5 TAN STRL (GAUZE/BANDAGES/DRESSINGS) ×5 IMPLANT
COVER SURGICAL LIGHT HANDLE (MISCELLANEOUS) ×4 IMPLANT
DRAPE U-SHAPE 47X51 STRL (DRAPES) ×6 IMPLANT
DURAPREP 26ML APPLICATOR (WOUND CARE) ×3 IMPLANT
ELECT REM PT RETURN 9FT ADLT (ELECTROSURGICAL) ×3
ELECTRODE REM PT RTRN 9FT ADLT (ELECTROSURGICAL) ×1 IMPLANT
GLOVE BIOGEL PI IND STRL 9 (GLOVE) ×1 IMPLANT
GLOVE BIOGEL PI INDICATOR 9 (GLOVE) ×2
GLOVE SURG ORTHO 9.0 STRL STRW (GLOVE) ×3 IMPLANT
GOWN STRL REUS W/ TWL XL LVL3 (GOWN DISPOSABLE) ×2 IMPLANT
GOWN STRL REUS W/TWL XL LVL3 (GOWN DISPOSABLE) ×6
KIT BASIN OR (CUSTOM PROCEDURE TRAY) ×3 IMPLANT
KIT PREVENA INCISION MGT 13 (CANNISTER) ×2 IMPLANT
KIT TURNOVER KIT B (KITS) ×3 IMPLANT
NS IRRIG 1000ML POUR BTL (IV SOLUTION) ×3 IMPLANT
PACK ORTHO EXTREMITY (CUSTOM PROCEDURE TRAY) ×3 IMPLANT
PAD ARMBOARD 7.5X6 YLW CONV (MISCELLANEOUS) ×6 IMPLANT
PREVENA INCISION MGT 90 150 (MISCELLANEOUS) ×2 IMPLANT
SUT ETHILON 2 0 PSLX (SUTURE) ×5 IMPLANT
TOWEL GREEN STERILE (TOWEL DISPOSABLE) ×3 IMPLANT
TUBE CONNECTING 12'X1/4 (SUCTIONS) ×1
TUBE CONNECTING 12X1/4 (SUCTIONS) ×2 IMPLANT
YANKAUER SUCT BULB TIP NO VENT (SUCTIONS) ×3 IMPLANT

## 2020-06-02 NOTE — TOC Initial Note (Addendum)
Transition of Care [facility]) - Initial/Assessment Note    Patient Details  Name: Timothy Ponce MRN: [id number] Date of Birth: 04-Aug-1956  Transition of Care [facility]) CM/SW Contact:    Marilu Favre, RN Phone Number: 06/02/2020, 3:19 PM  Clinical Narrative:                 Patient from home with mother. He has brought his mother walkers, canes, wheelchair and crutches . His mother only uses a cane so if he needs DME at discharge he has it.   Patient just returned from surgery then ultrasound. PT to see post op.   Discussed pre op recommendation for home health and provided Medicare.gov list of home health agencies. Patient states since he lives with his mother , he will have to get her permission to have a HHPT came to the house. Patient asked NCM to go over the medicare.gov list since he has vision problems. NCM read the agencies names and ratings.   Patient stated he does not want to go to a SNF.   Will await post op PT evaluation and patient to discuss with his mother.   Explained wound VAC will stay on 5 to 7 days.   Asked patient if he has a PCP. Patient states Medicaid has assigned him a PCP and name is on card, however he has never seen his PCP because his renal doctor "handles" his care.  Do not see where his Medicaid card has been scanned into Epic.  Expected Discharge Plan: [place] Barriers to Discharge: Continued Medical Work up   Patient Goals and CMS Choice Patient states their goals for this hospitalization and ongoing recovery are:: to return to home CMS Medicare.gov Compare Post Acute Care list provided to:: Patient Choice offered to / list presented to : Patient  Expected Discharge Plan and Services Expected Discharge Plan: [place]   Discharge Planning Services: CM Consult Post Acute Care Choice: [place] arrangements for the past 2 months: Single Family Home                           HH Arranged: PT           Prior Living Arrangements/Services Living arrangements for the past 2 months: Single Family Home Lives with:: Parents Patient language and need for interpreter reviewed:: Yes Do you feel safe going back to the place where you live?: Yes      Need for Family Participation in Patient Care: Yes (Comment) Care giver support system in place?: Yes (comment) Current home services: DME Criminal Activity/Legal Involvement Pertinent to Current Situation/Hospitalization: No - Comment as needed  Activities of Daily Living Home Assistive Devices/Equipment: None ADL Screening (condition at time of admission) Patient's cognitive ability adequate to safely complete daily activities?: No Is the patient deaf or have difficulty hearing?: No Does the patient have difficulty seeing, even when wearing glasses/contacts?: No Does the patient have difficulty concentrating, remembering, or making decisions?: No Patient able to express need for assistance with ADLs?: Yes Does the patient have difficulty dressing or bathing?: Yes Independently performs ADLs?: Yes (appropriate for developmental age) Does the patient have difficulty walking or climbing stairs?: Yes Weakness of Legs: Both Weakness of Arms/Hands: Both  Permission Sought/Granted   Permission granted to share information with : No              Emotional Assessment Appearance:: Appears stated age  Attitude/Demeanor/Rapport: Engaged Affect (typically observed): Apprehensive Orientation: : Oriented to Self, Oriented to Place, Oriented to  Time, Oriented to Situation Alcohol / Substance Use: Not Applicable Psych Involvement: No (comment)  Admission diagnosis:  Osteomyelitis ([place]) [M86.9] Osteomyelitis of right foot, unspecified type [facility]) [M86.9] Patient Active Problem List   Diagnosis Date Noted  . ESRD on hemodialysis ([place]) 05/31/2020  . Anemia of chronic renal failure 05/31/2020  . Peripheral arterial disease ([place]) 05/31/2020   . Osteomyelitis of fifth toe of right foot ([place]) 05/30/2020   PCP:  Patient, No Pcp Per Pharmacy:   [facility] [place], [place] [address] Phone: [phone] Fax: [phone]     Social Determinants of Health (SDOH) Interventions    Readmission Risk Interventions No flowsheet data found.

## 2020-06-02 NOTE — Progress Notes (Signed)
Orthopedic Tech Progress Note Patient Details:  Timothy Ponce 09/14/1956 [id number]  Ortho Devices Type of Ortho Device: Postop shoe/boot Ortho Device/Splint Location: RLE Ortho Device/Splint Interventions: Ordered, Application   Post Interventions Patient Tolerated: Well Instructions Provided: Care of device   Janit Pagan 06/02/2020, 1:23 PM

## 2020-06-02 NOTE — Op Note (Signed)
06/02/2020  10:50 AM  PATIENT:  Timothy Ponce    PRE-OPERATIVE DIAGNOSIS:  RIGHT FOOT OSTEOMYELITIS  POST-OPERATIVE DIAGNOSIS:  Same  PROCEDURE:  RIGHT FOOT 5TH RAY AMPUTATION Local tissue rearrangement for wound closure 3 x 9 cm. Application of 13 cm Prevena wound VAC.  SURGEON:  Newt Minion, MD  PHYSICIAN ASSISTANT:None ANESTHESIA:   General  PREOPERATIVE INDICATIONS:  Timothy Ponce is a  64 y.o. male with a diagnosis of RIGHT FOOT OSTEOMYELITIS who failed conservative measures and elected for surgical management.    The risks benefits and alternatives were discussed with the patient preoperatively including but not limited to the risks of infection, bleeding, nerve injury, cardiopulmonary complications, the need for revision surgery, among others, and the patient was willing to proceed.  OPERATIVE IMPLANTS: 13 cm Prevena wound VAC  @ENCIMAGES @  OPERATIVE FINDINGS: No deep abscess muscle healthy and viable wound edges were not adjacent to bone infection  OPERATIVE PROCEDURE: Patient brought the operating room and underwent a regional block.  After adequate levels anesthesia were obtained patient's right lower extremity was prepped using DuraPrep draped into a sterile field a timeout was called.  Elliptical incision was made around the ulcerative tissue and the fifth ray.  This was carried down to bone and the fifth ray and infected tissue was resected in 1 block of tissue.  The tissue margins were clear.  The wound was irrigated with normal saline electrocautery was used for hemostasis.  Local tissue rearrangement was used to close the wound 9 x 3 cm.  A 13 cm wound VAC was applied this had a good suction fit this was covered with Covan patient was taken the PACU in stable condition   DISCHARGE PLANNING:  Antibiotic duration: Continue antibiotics 24 hours  Weightbearing: Touchdown weightbearing on the right  Pain medication: Opioid pathway  Dressing care/ Wound VAC:  Continue wound VAC for 1 week  Ambulatory devices: Walker or crutches  Discharge to: Discharge to home once safe with therapy  Follow-up: In the office 1 week post operative.

## 2020-06-02 NOTE — Progress Notes (Signed)
OT Cancellation Note  Patient Details Name: Timothy Ponce MRN: [id number] DOB: 03-06-1956   Cancelled Treatment:    Reason Eval/Treat Not Completed: Patient at procedure or test/ unavailable.  Patient off floor at surgery this morning.  Will check back later in day as time allows.  August Luz, OTR/L   Phylliss Bob 06/02/2020, 7:58 AM

## 2020-06-02 NOTE — Anesthesia Procedure Notes (Signed)
Anesthesia Regional Block: Popliteal block   Pre-Anesthetic Checklist: ,, timeout performed, Correct Patient, Correct Site, Correct Laterality, Correct Procedure, Correct Position, site marked, Risks and benefits discussed,  Surgical consent,  Pre-op evaluation,  At surgeon's request and post-op pain management  Laterality: Right  Prep: chloraprep       Needles:  Injection technique: Single-shot  Needle Type: Echogenic Needle     Needle Length: 9cm  Needle Gauge: 21     Additional Needles:   Procedures:,,,, ultrasound used (permanent image in chart),,,,  Narrative:  Start time: 06/02/2020 9:32 AM End time: 06/02/2020 9:42 AM Injection made incrementally with aspirations every 5 mL.  Performed by: Personally  Anesthesiologist: Catalina Gravel, MD  Additional Notes: No pain on injection. No increased resistance to injection. Injection made in 5cc increments.  Good needle visualization.  Patient tolerated procedure well.

## 2020-06-02 NOTE — Progress Notes (Signed)
Pharmacy Antibiotic Note  Timothy Ponce is a 64 y.o. male admitted on 05/30/2020 with osteomyelitis of R 5th metatarsal head. s/p amputation this morning.  Pharmacy has been consulted for Vancomycin dosing.    ESRD, usual TTS- HD, last 6/17.   Cefazolin 2gm IV given pre-op; Ortho would like 24hrs post-op coverage. Discussed with Ortho PA, pre-op dose should provide ~24 hour coverage, til next HD planned on 6/19.  Vanc length of therapy per IM team.   Plan:  Vancomycin 500 mg IV after HD-TTS.  Will follow up for length of therapy.  Height: 5' 8.75" (174.6 cm) Weight: 61.7 kg (136 lb 0.4 oz) IBW/kg (Calculated) : 70.13  Temp (24hrs), Avg:97.9 F (36.6 C), Min:97 F (36.1 C), Max:98.5 F (36.9 C)  Recent Labs  Lab 05/30/20 1417 05/30/20 1604 05/31/20 0751 06/01/20 0725 06/02/20 0537  WBC 6.9  --  6.7 5.9 5.3  CREATININE 5.72*  --  7.63* 10.27* 7.55*  LATICACIDVEN 1.0 1.2  --   --   --      Allergies  Allergen Reactions  . Darvon [Propoxyphene] Other (See Comments)    Lungs filling up with fluid  . Iron Dextran     Other reaction(s): Breathing Problems  . Lac Bovis     Other reaction(s): Unknown  . Lactose Intolerance (Gi)     unknown    Antimicrobials this admission:  Vancomycin 6/16 >>  Cefazolin 2gm IV x 1 pre-op 6/18  Microbiology results:  6/16 blood x 2: no growth x 2 days to date  Thank you for allowing pharmacy to be a part of this patient's care.  Arty Baumgartner, [place] Phone: [phone] 06/02/2020 4:28 PM

## 2020-06-02 NOTE — Progress Notes (Addendum)
[facility] Progress Note   Subjective:   Seen in room, no new complaints. Completed HD yesterday - needs assistancwe with removing dsg. For toe amp this am.  Objective Vitals:   06/02/20 0051 06/02/20 0602 06/02/20 0948 06/02/20 1000  BP: 114/62 116/63    Pulse: 69 67  (!) 54  Resp: 18 18  (!) 7  Temp: 98.4 F (36.9 C) 98.3 F (36.8 C)    TempSrc: Oral Oral    SpO2: 100% 100%  100%  Weight:   61.7 kg   Height:   5' 8.75" (1.746 m)    Physical Exam General:  Well developed male in NAD Heart: RRR, no murmurs, rubs or gallops Lungs: CTA b/l without wheezing, rhonchi or rales Abdomen: Soft, non-tender, non-distended. +BS Extremities: No edema b/l lower extremities Dialysis Access:  RUE AVF currently accessed  Additional Objective Labs: Basic Metabolic Panel: Recent Labs  Lab 05/31/20 0751 06/01/20 0725 06/02/20 0537  NA 135 134* 134*  K 4.6 4.9 4.3  CL 93* 90* 94*  CO2 30 31 29   GLUCOSE 92 76 75  BUN 16 24* 16  CREATININE 7.63* 10.27* 7.55*  CALCIUM 9.4 9.5 9.1  PHOS 3.6 4.0 3.8   Liver Function Tests: Recent Labs  Lab 05/30/20 1417 05/30/20 1417 05/31/20 0751 06/01/20 0725 06/02/20 0537  AST 17  --   --   --   --   ALT 19  --   --   --   --   ALKPHOS 96  --   --   --   --   BILITOT 0.8  --   --   --   --   PROT 7.3  --   --   --   --   ALBUMIN 3.2*   < > 2.9* 2.7* 2.6*   < > = values in this interval not displayed.   CBC: Recent Labs  Lab 05/30/20 1417 05/30/20 1417 05/31/20 0751 06/01/20 0725 06/02/20 0537  WBC 6.9   < > 6.7 5.9 5.3  NEUTROABS 4.5  --   --   --   --   HGB 9.4*   < > 9.9* 8.7* 8.4*  HCT 30.7*   < > 31.3* 27.5* 26.6*  MCV 107.3*  --  105.4* 104.6* 105.1*  PLT 259   < > 267 255 221   < > = values in this interval not displayed.    Studies/Results: No results found. Medications: . sodium chloride 10 mL/hr at 06/02/20 1007   . [MAR Hold] brimonidine  1 drop Both Eyes BID  . [MAR Hold] chlorhexidine  15 mL  Mouth/Throat Once  . chlorhexidine  15 mL Mouth/Throat Once   Or  . mouth rinse  15 mL Mouth Rinse Once  . [MAR Hold] Chlorhexidine Gluconate Cloth  6 each Topical Q0600  . [MAR Hold] cinacalcet  30 mg Oral Q supper  . [MAR Hold] dorzolamide-timolol  1 drop Both Eyes BID  . [MAR Hold] latanoprost  1 drop Both Eyes QHS  . [MAR Hold] multivitamin  1 tablet Oral Q lunch  . [MAR Hold] Netarsudil Dimesylate  1 drop Both Eyes Daily  . [MAR Hold] senna-docusate  1 tablet Oral BID  . [MAR Hold] sevelamer carbonate  4,000 mg Oral TID WC  . [MAR Hold] sodium chloride flush  3 mL Intravenous Once  . [MAR Hold] vancomycin  500 mg Intravenous Q T,Th,Sa-HD    Dialysis Orders: Center: [facility] on TTS.  TTS 180NRe, BFR 400/DFR 800, EDW 60kg, 2K/2Ca, time 3.5hr,  RUE AVF, heparin 1800 unit bolus mircera 183mcg IVP q 2 weeks- last dose 30mcg on 05/27/20 Calcitriol 1.75 mcg PO q HD Sensipar30mg  daily Binder: nevela 5 tabs TID with meals  Assessment/Plan: 1.  Osteomyelitis: Seen by ortho, on IV vanc and planned for 5th ray amputation on Friday.  2.  ESRD:  TTS schedule.  Please avoid gadolium contrast for all ESRD patients, the consequences can be devastating. Okay to use R hand (AVF arm) for blood draws only after all other options have been exhausted. . We can draw labs on dialysis on HD days per pt request.   Patient does need assistance removing tape from AVF after dialysis due to vision impairment.  Advised we can remove dressing while rounding in the morning. Next HD 6/19.  3.  Hypertension/vonilume: BP controlled, below EDW and not volume overloaded on exam. Not on BP meds. BP tends to run low during dialysis. 4.  Anemia: Hgb 9.9 > 8.7 >8.4  ESA dose recently increased and due 6/26. No IV Fe due to acute infection.  5.  Metabolic bone disease: Corr calcium high, hold calctriol and follow. Phos 3.6. Continue sensipar and binder.  6.  Nutrition:  Renal diet with fluid  restrictions  Lynnda Child PA-C [place] 06/02/2020,10:19 AM  Pt seen, examined and agree w assess/plan as above with additions as indicated.  [facility] 06/02/2020, 11:06 AM

## 2020-06-02 NOTE — Interval H&P Note (Signed)
History and Physical Interval Note:  06/02/2020 6:44 AM  Timothy Ponce  has presented today for surgery, with the diagnosis of RIGHT FOOT OSTEOMYELITIS.  The various methods of treatment have been discussed with the patient and family. After consideration of risks, benefits and other options for treatment, the patient has consented to  Procedure(s): RIGHT FOOT 5TH RAY AMPUTATION (Right) as a surgical intervention.  The patient's history has been reviewed, patient examined, no change in status, stable for surgery.  I have reviewed the patient's chart and labs.  Questions were answered to the patient's satisfaction.     Newt Minion

## 2020-06-02 NOTE — Progress Notes (Signed)
ABI       has been completed. Preliminary results can be found under CV proc through chart review. Malissie Musgrave, BS, RDMS, RVT   

## 2020-06-02 NOTE — Anesthesia Procedure Notes (Signed)
Procedure Name: MAC Date/Time: 06/02/2020 10:06 AM Performed by: Inda Coke, CRNA Pre-anesthesia Checklist: Patient identified, Emergency Drugs available, Suction available, Timeout performed and Patient being monitored Patient Re-evaluated:Patient Re-evaluated prior to induction Oxygen Delivery Method: Simple face mask Induction Type: IV induction Dental Injury: Teeth and Oropharynx as per pre-operative assessment

## 2020-06-02 NOTE — Anesthesia Postprocedure Evaluation (Signed)
Anesthesia Post Note  Patient: Timothy Ponce  Procedure(s) Performed: RIGHT FOOT 5TH RAY AMPUTATION (Right )     Patient location during evaluation: PACU Anesthesia Type: Regional Level of consciousness: awake and alert, awake and oriented Pain management: pain level controlled Vital Signs Assessment: post-procedure vital signs reviewed and stable Respiratory status: spontaneous breathing, nonlabored ventilation, respiratory function stable and patient connected to nasal cannula oxygen Cardiovascular status: stable and blood pressure returned to baseline Postop Assessment: no apparent nausea or vomiting Anesthetic complications: no   No complications documented.  Last Vitals:  Vitals:   06/02/20 1054 06/02/20 1222  BP: (!) 106/53 (!) 103/58  Pulse: (!) 53 (!) 51  Resp: 12 16  Temp: 36.7 C (!) 36.3 C  SpO2: 97% 99%    Last Pain:  Vitals:   06/02/20 1222  TempSrc: Oral  PainSc:                  Catalina Gravel

## 2020-06-02 NOTE — Progress Notes (Signed)
NAME:  Timothy Ponce, MRN:  [id number], DOB:  03-01-1956, LOS: 3 ADMISSION DATE:  05/30/2020  Subjective  No overnight event. Reports had some pain in foot every four hours overnight, relieved with pain medicine.  Requesting update relayed to his mother.  Significant Hospital Events   6/15 admission 6/16 MRI showing osteomyelitis with abscess formation. Dr. Sharol Given planning for surgery on 6/18 6/17 HD UF 500cc 6/18 surgery--5th ray amputation  Objective   Blood pressure 116/63, pulse 67, temperature 98.3 F (36.8 C), temperature source Oral, resp. rate 18, height 5' 8.75" (1.746 m), weight 61.7 kg, SpO2 100 %.     Intake/Output Summary (Last 24 hours) at 06/02/2020 0952 Last data filed at 06/01/2020 1600 Gross per 24 hour  Intake 77.9 ml  Output 500 ml  Net -422.1 ml    Examination: General: in NAD Cardiac: RRR. No JVD. AVF present on right forearm. Pulm: breathing comfortably on room air Skin: no rash  Consults:  ortho  Significant Diagnostic Tests:  R foot xray>>lucency in the 5th metatarsal head concerning for possible acute osteomyelitis.  R foot MRI>>area of superfiical ulceration with probable soft tissue abscess overlying the 5th metatarsal head measuring 1.7x0.6x1.5cm. findings suggestive of osteomyelitis involving the 5th metatarsal head. ABIs>>  Micro Data:  6/15 blood culture x2>>NGTD  Antimicrobials:  Vancomycin 6/15>>  Summary  64 yo male with ESRD on HD (THS) who was admitted to IMTS 05/30/20 for 3w history of right foot pain. Xray in the ED concerning for osteomyelitis. Orthopedics was consulted and are planning 5th ray amputation on 6/18. We are continuing abx treatment with vancomycin in the meantime.  Assessment & Plan:  Principal Problem:   Osteomyelitis of fifth toe of right foot (HCC) Active Problems:   ESRD on hemodialysis (HCC)   Anemia of chronic renal failure   Peripheral arterial disease (HCC)  Osteomyelitis of the 5th metatarsal head  with soft tissue abscess. As seen on MRI.  Remains afebrile. NGTD on blood cultures. Plan Antibiotic cessation to be guided by ortho Follow blood cultures Appreciate ortho recs: planning for 5th ray amputation today  Pain management: Prn po dilaudid, Senakot bid PT/OT Please obtain ABIs for assessment of blow flow/wound healing--ordered by Dr. Heber Superior 6/16 however still not done  ESRD on HD (THS). Became mildly hypotensive during HD on 6/17 however resolved following session. Management per nephrology. Monitor renal function.   Macrocytic anemia. MCV 105. B12 normal. Continue to monitor. Transfuse for hgb <7.  Best practice:  CODE STATUS: FULL Diet: NPO pre-operatively.  DVT for prophylaxis: heparin subcut. Hold preoperatively Social considerations/Family communication: Dr. Sharol Given to update wife after procedure Dispo: pending further evaluation and management. Suspect d/c in [place], MD [place] PGY-1 Contact info:  Please page me at [phone] from 7am-5pm M-F.  Please use the IMTS after hours pager at [phone] after 5pm M-F and on weekends 06/02/20  9:52 AM  Labs    CBC Latest Ref Rng & Units 06/02/2020 06/01/2020 05/31/2020  WBC 4.0 - 10.5 K/uL 5.3 5.9 6.7  Hemoglobin 13.0 - 17.0 g/dL 8.4(L) 8.7(L) 9.9(L)  Hematocrit 39 - 52 % 26.6(L) 27.5(L) 31.3(L)  Platelets 150 - 400 K/uL 221 255 267   BMP Latest Ref Rng & Units 06/02/2020 06/01/2020 05/31/2020  Glucose 70 - 99 mg/dL 75 76 92  BUN 8 - 23 mg/dL 16 24(H) 16  Creatinine 0.61 - 1.24 mg/dL 7.55(H) 10.27(H) 7.63(H)  Sodium 135 - 145 mmol/L 134(L) 134(L) 135  Potassium 3.5 - 5.1 mmol/L 4.3 4.9 4.6  Chloride 98 - 111 mmol/L 94(L) 90(L) 93(L)  CO2 22 - 32 mmol/L 29 31 30   Calcium 8.9 - 10.3 mg/dL 9.1 9.5 9.4

## 2020-06-02 NOTE — Transfer of Care (Signed)
Immediate Anesthesia Transfer of Care Note  Patient: Timothy Ponce  Procedure(s) Performed: RIGHT FOOT 5TH RAY AMPUTATION (Right )  Patient Location: PACU  Anesthesia Type:MAC combined with regional for post-op pain  Level of Consciousness: awake and alert   Airway & Oxygen Therapy: Patient Spontanous Breathing and Patient connected to face mask oxygen  Post-op Assessment: Report given to RN and Post -op Vital signs reviewed and stable  Post vital signs: Reviewed and stable  Last Vitals:  Vitals Value Taken Time  BP 102/57 06/02/20 1039  Temp    Pulse 60 06/02/20 1040  Resp 25 06/02/20 1040  SpO2 100 % 06/02/20 1040  Vitals shown include unvalidated device data.  Last Pain:  Vitals:   06/02/20 1000  TempSrc:   PainSc: 0-No pain      Patients Stated Pain Goal: 0 (56/38/75 6433)  Complications: No complications documented.

## 2020-06-02 NOTE — Anesthesia Preprocedure Evaluation (Addendum)
Anesthesia Evaluation  Patient identified by MRN, date of birth, ID band Patient awake    Reviewed: Allergy & Precautions, NPO status , Patient's Chart, lab work & pertinent test results  Airway Mallampati: II  TM Distance: >3 FB Neck ROM: Full    Dental  (+) Teeth Intact, Dental Advisory Given   Pulmonary neg pulmonary ROS,    Pulmonary exam normal breath sounds clear to auscultation       Cardiovascular + Peripheral Vascular Disease  Normal cardiovascular exam Rhythm:Regular Rate:Normal     Neuro/Psych negative neurological ROS     GI/Hepatic negative GI ROS, Neg liver ROS,   Endo/Other  negative endocrine ROS  Renal/GU Renal Insufficiency, ESRF and DialysisRenal disease     Musculoskeletal RIGHT FOOT OSTEOMYELITIS   Abdominal   Peds  Hematology  (+) Blood dyscrasia, anemia ,   Anesthesia Other Findings   Reproductive/Obstetrics                            Anesthesia Physical Anesthesia Plan  ASA: III  Anesthesia Plan: Regional   Post-op Pain Management:    Induction: Intravenous  PONV Risk Score and Plan: 1 and Propofol infusion and Treatment may vary due to age or medical condition  Airway Management Planned: Natural Airway and Nasal Cannula  Additional Equipment:   Intra-op Plan:   Post-operative Plan:   Informed Consent: I have reviewed the patients History and Physical, chart, labs and discussed the procedure including the risks, benefits and alternatives for the proposed anesthesia with the patient or authorized representative who has indicated his/her understanding and acceptance.     Dental advisory given  Plan Discussed with: CRNA  Anesthesia Plan Comments:        Anesthesia Quick Evaluation

## 2020-06-03 ENCOUNTER — Encounter (HOSPITAL_COMMUNITY): Payer: Self-pay | Admitting: Orthopedic Surgery

## 2020-06-03 DIAGNOSIS — Z89421 Acquired absence of other right toe(s): Secondary | ICD-10-CM

## 2020-06-03 LAB — LIPID PANEL
Cholesterol: 93 mg/dL (ref 0–200)
HDL: 28 mg/dL — ABNORMAL LOW (ref >40)
LDL Cholesterol: 42 mg/dL (ref 0–99)
Total CHOL/HDL Ratio: 3.3 RATIO
Triglycerides: 113 mg/dL (ref <150)
VLDL: 23 mg/dL (ref 0–40)

## 2020-06-03 LAB — CBC
HCT: 26.6 % — ABNORMAL LOW (ref 39.0–52.0)
Hemoglobin: 8.4 g/dL — ABNORMAL LOW (ref 13.0–17.0)
MCH: 33.3 pg (ref 26.0–34.0)
MCHC: 31.6 g/dL (ref 30.0–36.0)
MCV: 105.6 fL — ABNORMAL HIGH (ref 80.0–100.0)
Platelets: 233 10*3/uL (ref 150–400)
RBC: 2.52 MIL/uL — ABNORMAL LOW (ref 4.22–5.81)
RDW: 15.9 % — ABNORMAL HIGH (ref 11.5–15.5)
WBC: 5.9 10*3/uL (ref 4.0–10.5)
nRBC: 0 % (ref 0.0–0.2)

## 2020-06-03 LAB — RENAL FUNCTION PANEL
Albumin: 2.4 g/dL — ABNORMAL LOW (ref 3.5–5.0)
Anion gap: 15 (ref 5–15)
BUN: 29 mg/dL — ABNORMAL HIGH (ref 8–23)
CO2: 24 mmol/L (ref 22–32)
Calcium: 8.9 mg/dL (ref 8.9–10.3)
Chloride: 96 mmol/L — ABNORMAL LOW (ref 98–111)
Creatinine, Ser: 10.2 mg/dL — ABNORMAL HIGH (ref 0.61–1.24)
GFR calc Af Amer: 6 mL/min — ABNORMAL LOW (ref >60)
GFR calc non Af Amer: 5 mL/min — ABNORMAL LOW (ref >60)
Glucose, Bld: 64 mg/dL — ABNORMAL LOW (ref 70–99)
Phosphorus: 4.6 mg/dL (ref 2.5–4.6)
Potassium: 4.7 mmol/L (ref 3.5–5.1)
Sodium: 135 mmol/L (ref 135–145)

## 2020-06-03 MED ORDER — HYDROMORPHONE HCL 1 MG/ML IJ SOLN
INTRAMUSCULAR | Status: AC
  Administered 2020-06-03: 1 mg
  Filled 2020-06-03: qty 1

## 2020-06-03 MED ORDER — VANCOMYCIN HCL 500 MG/100ML IV SOLN
500.0000 mg | Freq: Once | INTRAVENOUS | Status: AC
  Administered 2020-06-03: 500 mg via INTRAVENOUS
  Filled 2020-06-03: qty 100

## 2020-06-03 MED ORDER — HYDROMORPHONE HCL 2 MG PO TABS
1.0000 mg | ORAL_TABLET | ORAL | Status: DC | PRN
  Administered 2020-06-03: 1 mg via ORAL
  Filled 2020-06-03 (×4): qty 1

## 2020-06-03 MED ORDER — HYDROMORPHONE HCL 1 MG/ML IJ SOLN
1.0000 mg | INTRAMUSCULAR | Status: DC | PRN
  Administered 2020-06-03 – 2020-06-04 (×3): 1 mg via INTRAVENOUS
  Filled 2020-06-03 (×3): qty 1

## 2020-06-03 MED ORDER — HEPARIN SODIUM (PORCINE) 1000 UNIT/ML IJ SOLN
INTRAMUSCULAR | Status: AC
  Administered 2020-06-03: 2900 [IU]
  Filled 2020-06-03: qty 3

## 2020-06-03 MED ORDER — VANCOMYCIN HCL IN DEXTROSE 750-5 MG/150ML-% IV SOLN
750.0000 mg | Freq: Once | INTRAVENOUS | Status: DC
  Filled 2020-06-03: qty 150

## 2020-06-03 NOTE — Progress Notes (Signed)
PT Cancellation Note  Patient Details Name: Timothy Ponce MRN: [id number] DOB: 12-02-1956   Cancelled Treatment:    Reason Eval/Treat Not Completed: Patient at procedure or test/unavailable (HD). PT will continue to f/u with pt acutely as available.    Clearnce Sorrel Jemmie Rhinehart 06/03/2020, 10:12 AM

## 2020-06-03 NOTE — Evaluation (Addendum)
Occupational Therapy Evaluation Patient Details Name: Timothy Ponce MRN: [id number] DOB: [birth year] Today's Date: 06/03/2020    History of Present Illness Pt is a 64 yo male presenting with osteomyelitis of his fifth ray s/p amputation on 6/18. 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   Clinical Impression   Pt typically functions modified independently in self care. His mother helps him at times due impaired vision. Pt presents with 10/10 pain when R LE in dependent position, decreased activity tolerance and poor standing balance. He requires up to min assist for limited mobility and up to total assist for ADL. Pt has limited hand function. Issued foam build up for trial use on utensils and toothbrush. Began educating pt in compensatory strategies for LB ADL in sitting, leaning side to side and benefits of tub transfer bench when pt is allowed to shower vs sponge bathing until pt is able to weight bear.  Pt's home set up is a barrier to discharging home while pt is TDWB as he has stairs. His support is his elderly mother.  He also needs to leave his home for HD. Recommending SNF for rehab.    Follow Up Recommendations  SNF;Supervision/Assistance - 24 hour    Equipment Recommendations  3 in 1 bedside commode;Tub/shower bench (tub bench if pt is agreeable to removing shower doors)    Recommendations for Other Services       Precautions / Restrictions Precautions Precautions: Fall Precaution Comments: wound VAC, glaucoma, legally blind Restrictions Weight Bearing Restrictions: Yes RLE Weight Bearing: Touchdown weight bearing      Mobility Bed Mobility Overal bed mobility: Needs Assistance Bed Mobility: Supine to Sit;Sit to Supine     Supine to sit: Supervision Sit to supine: Supervision   General bed mobility comments: increased time and effort needed, HOB flat  Transfers Overall transfer  level: Needs assistance Equipment used: Rolling walker (2 wheeled) Transfers: Sit to/from Stand Sit to Stand: Min guard         General transfer comment: cueing for safe hand placement and technique to adhere to [place] precautions, min guard for safety    Balance Overall balance assessment: Needs assistance Sitting-balance support: Feet supported Sitting balance-Leahy Scale: Good     Standing balance support: During functional activity;Bilateral upper extremity supported Standing balance-Leahy Scale: Poor                             ADL either performed or assessed with clinical judgement   ADL Overall ADL's : Needs assistance/impaired Eating/Feeding: Minimal assistance;Sitting Eating/Feeding Details (indicate cue type and reason): issued foam build up for utensils and educated in use Grooming: Oral care;Sitting;Minimal assistance Grooming Details (indicate cue type and reason): educated in use of foam build up for toothbrush, recommended battery operated toothbrush Upper Body Bathing: Minimal assistance;Sitting   Lower Body Bathing: Total assistance;Sit to/from stand   Upper Body Dressing : Minimal assistance;Sitting   Lower Body Dressing: Total assistance;Sit to/from stand   Toilet Transfer: Min guard;Stand-pivot;BSC;RW   Toileting- Water quality scientist and Hygiene: Minimal assistance;Sitting/lateral lean       Functional mobility during ADLs: Minimal assistance;Rolling walker General ADL Comments: Began educating pt in compensatory strategies for LB bathing and dressing from sitting position and recommended tub transfer bench, although pt will have to remove his shower doors for use.     Vision Baseline Vision/History: Legally  blind;Glaucoma;Cataracts Patient Visual Report: No change from baseline       Perception     Praxis      Pertinent Vitals/Pain Pain Assessment: 0-10 Pain Score: 10-Worst pain ever Pain Location: R foot when sitting  upright and standing Pain Descriptors / Indicators: Sore;Throbbing Pain Intervention(s): Monitored during session;Repositioned;Premedicated before session     Hand Dominance Right   Extremity/Trunk Assessment Upper Extremity Assessment Upper Extremity Assessment: RUE deficits/detail;LUE deficits/detail RUE Deficits / Details: decreased sensation (LT) in hand, unable to complete fist bilaterally, other motions WFL, possible steal syndrome RUE Sensation: history of peripheral neuropathy;decreased light touch RUE Coordination: decreased fine motor LUE Deficits / Details: decreased sensation (LT) in hands, unable to complete fist bilaterally, other motions WFL, reports carpal tunnel, possible steal syndrome LUE Sensation: history of peripheral neuropathy;decreased light touch LUE Coordination: decreased fine motor   Lower Extremity Assessment Lower Extremity Assessment: Defer to PT evaluation   Cervical / Trunk Assessment Cervical / Trunk Assessment: Normal   Communication Communication Communication: No difficulties   Cognition Arousal/Alertness: Awake/alert Behavior During Therapy: WFL for tasks assessed/performed Overall Cognitive Status: Impaired/Different from baseline Area of Impairment: Awareness;Safety/judgement                         Safety/Judgement: Decreased awareness of deficits Awareness: Emergent   General Comments: pt largely intact, but with poor insight into how he will manage given TDWB status   General Comments       Exercises     Shoulder Instructions      Home Living Family/patient expects to be discharged to:: Private residence Living Arrangements: Parent (34 year old mother) Available Help at Discharge: Family;Available PRN/intermittently (pt can provide limited assistance) Type of Home: House Home Access: Stairs to enter [employer] of Steps: 4 Entrance Stairs-Rails: Left;Right Home Layout: Two level Alternate Level  Stairs-Number of Steps: 10 Alternate Level Stairs-Rails: Right Bathroom Shower/Tub: Tub/shower unit;Door   [employer] Toilet: Standard     Home Equipment: Crutches;Cane - quad;Wheelchair - manual   Additional Comments: pt reports he may have access to crutches and a cane but does not know what kind      Prior Functioning/Environment Level of Independence: Independent        Comments: pt reports independent with mobility and ADLs, pain limited reccently. legally blind, uses [employer] for transportation        OT Problem List: Decreased strength;Decreased activity tolerance;Impaired balance (sitting and/or standing);Decreased coordination;Decreased cognition;Decreased knowledge of use of DME or AE;Impaired UE functional use;Pain;Impaired vision/perception      OT Treatment/Interventions: Self-care/ADL training;DME and/or AE instruction;Therapeutic activities;Patient/family education;Balance training    OT Goals(Current goals can be found in the care plan section) Acute Rehab OT Goals Patient Stated Goal: return home OT Goal Formulation: With patient Time For Goal Achievement: 06/17/20 Potential to Achieve Goals: Good ADL Goals Pt Will Perform Lower Body Bathing: with supervision;sitting/lateral leans Pt Will Perform Lower Body Dressing: with supervision;sitting/lateral leans Pt Will Transfer to Toilet: with supervision;ambulating;bedside commode (over toilet) Pt Will Perform Toileting - Clothing Manipulation and hygiene: with supervision;sitting/lateral leans Pt Will Perform Tub/Shower Transfer: Tub transfer;with supervision;ambulating;tub bench;rolling walker Additional ADL Goal #1: Pt will adhere to TDWB on R LE during ADL and mobility. Additional ADL Goal #2: Pt will be knowledgeable in use of handle build ups for eating utensils and toothbrush.  OT Frequency: Min 2X/week   Barriers to D/C:            Co-evaluation  AM-PAC OT "6 Clicks" Daily Activity      Outcome Measure Help from another person eating meals?: A Little Help from another person taking care of personal grooming?: A Little Help from another person toileting, which includes using toliet, bedpan, or urinal?: A Lot Help from another person bathing (including washing, rinsing, drying)?: A Lot Help from another person to put on and taking off regular upper body clothing?: A Little Help from another person to put on and taking off regular lower body clothing?: A Lot 6 Click Score: 15   End of Session Equipment Utilized During Treatment: Rolling walker;Gait belt  Activity Tolerance: Patient limited by pain Patient left: in bed;with call bell/phone within reach;with bed alarm set  OT Visit Diagnosis: Unsteadiness on feet (R26.81);Other abnormalities of gait and mobility (R26.89);Pain;Muscle weakness (generalized) (M62.81);Low vision, both eyes (H54.2)                Time: 1450-1510 OT Time Calculation (min): 20 min Charges:  OT General Charges $OT Visit: 1 Visit OT Evaluation $OT Eval Moderate Complexity: 1 Mod  Nestor Lewandowsky, OTR/L Acute Rehabilitation Services Pager: [phone] Office: [phone]  Timothy Ponce 06/03/2020, 3:24 PM

## 2020-06-03 NOTE — Progress Notes (Signed)
[facility] Progress Note   Subjective:  Underwent R fifth ray amp yesterday. Seen in HD unit, tolerating UF, some pain at amputation site.   Objective Vitals:   06/02/20 1824 06/02/20 2058 06/03/20 0056 06/03/20 0512  BP: 120/62 123/66 120/63 115/63  Pulse: 65 67 66   Resp: 16 16 16 17   Temp: 98 F (36.7 C) 98.4 F (36.9 C) 98.7 F (37.1 C) 98.3 F (36.8 C)  TempSrc: Oral Oral Oral Oral  SpO2: 96% 98% 100% 95%  Weight:      Height:       Physical Exam General:  Well developed male in NAD Heart: RRR, no murmurs, rubs or gallops Lungs: CTA b/l without wheezing, rhonchi or rales Abdomen: Soft, non-tender, non-distended. +BS Extremities: No edema b/l lower extremities Dialysis Access:  RUE AVF currently accessed  Additional Objective Labs: Basic Metabolic Panel: Recent Labs  Lab 06/01/20 0725 06/02/20 0537 06/03/20 0839  NA 134* 134* 135  K 4.9 4.3 4.7  CL 90* 94* 96*  CO2 31 29 24   GLUCOSE 76 75 64*  BUN 24* 16 29*  CREATININE 10.27* 7.55* 10.20*  CALCIUM 9.5 9.1 8.9  PHOS 4.0 3.8 4.6   Liver Function Tests: Recent Labs  Lab 05/30/20 1417 05/31/20 0751 06/01/20 0725 06/02/20 0537 06/03/20 0839  AST 17  --   --   --   --   ALT 19  --   --   --   --   ALKPHOS 96  --   --   --   --   BILITOT 0.8  --   --   --   --   PROT 7.3  --   --   --   --   ALBUMIN 3.2*   < > 2.7* 2.6* 2.4*   < > = values in this interval not displayed.   CBC: Recent Labs  Lab 05/30/20 1417 05/30/20 1417 05/31/20 0751 05/31/20 0751 06/01/20 0725 06/02/20 0537 06/03/20 0839  WBC 6.9   < > 6.7   < > 5.9 5.3 5.9  NEUTROABS 4.5  --   --   --   --   --   --   HGB 9.4*   < > 9.9*   < > 8.7* 8.4* 8.4*  HCT 30.7*   < > 31.3*   < > 27.5* 26.6* 26.6*  MCV 107.3*  --  105.4*  --  104.6* 105.1* 105.6*  PLT 259   < > 267   < > 255 221 233   < > = values in this interval not displayed.    Studies/Results: VAS [place] ABI WITH/WO TBI  Result Date: 06/02/2020 LOWER  EXTREMITY DOPPLER STUDY Indications: Right 5th digit amputation 06/02/20, osteomyelitis. Other Factors: Palpable pedal pulses.  Performing Technologist: June Leap Rdms, Rvt  Examination Guidelines: A complete evaluation includes at minimum, Doppler waveform signals and systolic blood pressure reading at the level of bilateral brachial, anterior tibial, and posterior tibial arteries, when vessel segments are accessible. Bilateral testing is considered an integral part of a complete examination. Photoelectric Plethysmograph (PPG) waveforms and toe systolic pressure readings are included as required and additional duplex testing as needed. Limited examinations for reoccurring indications may be performed as noted.  ABI Findings: +---------+------------------+-----+---------+--------------------------------+ Right    Rt Pressure (mmHg)IndexWaveform Comment                          +---------+------------------+-----+---------+--------------------------------+ Brachial  Not obtained due to restricted                                            arm                              +---------+------------------+-----+---------+--------------------------------+ ATA      255               1.90 triphasic                                 +---------+------------------+-----+---------+--------------------------------+ PTA      255               1.90 triphasic                                 +---------+------------------+-----+---------+--------------------------------+ Great Toe                                right foot bandaged post surgery +---------+------------------+-----+---------+--------------------------------+ +--------+------------------+-----+---------+-------+ Left    Lt Pressure (mmHg)IndexWaveform Comment +--------+------------------+-----+---------+-------+ [id number]                    triphasic         +--------+------------------+-----+---------+-------+ ATA     255               1.90 triphasic        +--------+------------------+-----+---------+-------+ PTA     255               1.90 triphasic        +--------+------------------+-----+---------+-------+  Summary: Right: Resting right ankle-brachial index indicates noncompressible right lower extremity arteries. However, multiphasic pedal waveforms suggest adequate arterial flow. Left: Resting left ankle-brachial index indicates noncompressible left lower extremity arteries. However, multiphasic pedal waveforms suggest adequate arterial flow.  *See table(s) above for measurements and observations.  Electronically signed by Servando Snare MD on 06/02/2020 at 4:23:14 PM.   Final    Medications: . sodium chloride 10 mL/hr at 06/02/20 1007  . sodium chloride 75 mL/hr at 06/02/20 1202   . brimonidine  1 drop Both Eyes BID  . chlorhexidine  15 mL Mouth/Throat Once  . Chlorhexidine Gluconate Cloth  6 each Topical Q0600  . cinacalcet  30 mg Oral Q supper  . docusate sodium  100 mg Oral BID  . dorzolamide-timolol  1 drop Both Eyes BID  . latanoprost  1 drop Both Eyes QHS  . multivitamin  1 tablet Oral Q lunch  . Netarsudil Dimesylate  1 drop Both Eyes Daily  . senna-docusate  1 tablet Oral BID  . sevelamer carbonate  4,000 mg Oral TID WC  . sodium chloride flush  3 mL Intravenous Once    Dialysis Orders: Center: [facility] on TTS. TTS 180NRe, BFR 400/DFR 800, EDW 60kg, 2K/2Ca, time 3.5hr,  RUE AVF, heparin 1800 unit bolus mircera 161mcg IVP q 2 weeks- last dose 66mcg on 05/27/20 Calcitriol 1.75 mcg PO q HD Sensipar30mg  daily Binder: Renvela 5 tabs TID with meals  Assessment/Plan: 1.  Osteomyelitis: Seen by ortho, s/p right  5th ray amputation on 6/18.  2.  ESRD:  TTS schedule.  Please avoid gadolium contrast for all ESRD patients, the consequences can be devastating. Okay to use R hand (AVF arm) for blood draws only  after all other options have been exhausted. . We can draw labs on dialysis on HD days per pt request.   Patient does need assistance removing tape from AVF after dialysis due to vision impairment.  Advised we can remove dressing while rounding in the morning. Next HD 6/19.  3. Hypertension/volume: BP controlled. Not on BP meds. BP tends to run low during dialysis. UF to EDW as tolerated.  4.  Anemia: Hgb 9.9 > 8.7 >8.4  ESA dose recently increased and due 6/26. No IV Fe due to acute infection.  5. Metabolic bone disease: Corr calcium high, hold calctriol and follow. Phos ok. Continue sensipar and binder.  6. Nutrition:  Renal diet with fluid restrictions  Lynnda Child PA-C [facility] 06/03/2020,9:40 AM

## 2020-06-03 NOTE — Progress Notes (Addendum)
   Subjective:  Timothy Ponce is a 64 y.o. with PMH of ESRD, glaucoma admit for R foot osteomyelitis on hospital day 4  Mr. Timothy Ponce was examined at bedside this morning during dialysis with Dr.Duda. Notes excruciating pain in right foot at this time and requesting pain medication. He states overnight, he would be woken up due to pain every 3-4 hours. He mentions pain being relieved with the dilaudid.   Objective:  Vital signs in last 24 hours: Vitals:   06/02/20 1824 06/02/20 2058 06/03/20 0056 06/03/20 0512  BP: 120/62 123/66 120/63 115/63  Pulse: 65 67 66   Resp: 16 16 16 17   Temp: 98 F (36.7 C) 98.4 F (36.9 C) 98.7 F (37.1 C) 98.3 F (36.8 C)  TempSrc: Oral Oral Oral Oral  SpO2: 96% 98% 100% 95%  Weight:      Height:       Gen: Well-developed, well nourished, In distress HEENT: NCAT head, hearing intact, EOMI, Poor vision, MMM Neck: supple, ROM intact CV: RRR, S1, S2 normal, No rubs, no murmurs, RUE AVF currently being used for HD Pulm: CTAB, No rales, no wheezes Extm: ROM intact, Peripheral pulses intact, RLE covered in bandaging with non-functioning wound-vac Skin: Dry, Warm, normal turgor Neuro: AAOx3  Assessment/Plan:  Principal Problem:   Osteomyelitis of fifth toe of right foot (HCC) Active Problems:   ESRD on hemodialysis (HCC)   Anemia of chronic renal failure   Peripheral arterial disease ([place])  Patient is a 64 year old male with past medical history significant for ESRD on HD Tuesday/Thursday/Saturday and glaucoma who presents for evaluation of right foot pain and swelling.   Right foot 5th metatarsal Osteomyelitis s/p amputation Radiograph reveals findings consistent with osteomyelitis of 5th metatarsal head. Amputation performed 06/02/20. Per op notes, clear tissue margins. Abx to end after 24 hours post-op - Appreciate ortho recs: C/w wound vac for 7 days, d/c home when able to ambulate w/o weight-bearing - D/c Vanc after next dose - F/u  post-op PT/OT eval  ESRD Goes to HD Tu/Thu/Sa - HD per nephro - Avoid renally cleared meds when able  PAD On X-ray, noted calcifications concerning for peripheral arterial disease. Would benefit from statin and anti-platelet therapy. Lipid panel with ldl - 42. - Monitor for symptoms, can consider starting antiplatelet therapy in outpatient setting - Not indicated for initiation of statin therapy  Glaucoma - C/w home meds: Brimonidine, Cosopt, latanoprost, netarsudil dimesylate  DVT prophx: SCDs Diet: Renal Bowel: Senokot Code: Full  Prior to Admission Living Arrangement: Home Anticipated Discharge Location: Home Barriers to Discharge: Medical treatment Dispo: Anticipated discharge in approximately 0-1 day(s).   Mosetta Anis, MD 06/03/2020, 6:40 AM Pager: [phone] After 5pm on weekdays and 1pm on weekends: On Call Pager: [phone]

## 2020-06-03 NOTE — Progress Notes (Signed)
OT Cancellation Note  Patient Details Name: Timothy Ponce MRN: [id number] DOB: 10-04-1956   Cancelled Treatment:    Reason Eval/Treat Not Completed: Patient at procedure or test/ unavailable (HD)  Malka So 06/03/2020, 11:41 AM  Nestor Lewandowsky, OTR/L Acute Rehabilitation Services Pager: [phone] Office: [phone]

## 2020-06-03 NOTE — Progress Notes (Signed)
Patient ID: Timothy Ponce, male   DOB: 02-17-1956, 64 y.o.   MRN: [id number] Patient is postoperative day 1 right foot fifth ray amputation.  The wound VAC pump was turned off.  This was turned back on and is working properly at this time.  Patient may discharge to home when he is safe with physical therapy touchdown weightbearing on the right.  Ideally the less weight on the right foot the better.  I will follow-up in the office in 1 week continue with the wound VAC pump at discharge.

## 2020-06-03 NOTE — Progress Notes (Signed)
Physical Therapy Treatment Patient Details Name: Timothy Ponce MRN: [id number] DOB: 12/28/1955 Today's Date: 06/03/2020    History of Present Illness Pt is a 64 yo male presenting with osteomyelitis of his fifth ray s/p amputation on 6/18. 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    PT Comments    Pt seen for re-evaluation following R foot 5th ray amputation. Pt significantly limited secondary to pain and weakness. He was able to perform bed mobility with supervision and transfers with min guard. He was only able to tolerate a few small hops on L LE with RW and min A. Pt reporting 10/10 pain in R foot with standing. Based on pt's current functional mobility status, limitations, home set-up with several stairs and lack of solid support at home (pt lives with his 66 y/o mother), would recommend pt d/c to SNF for further intensive therapy services to maximize his independence with functional mobility prior to returning home. PT will continue to follow acutely to progress mobility as tolerated.     Follow Up Recommendations  SNF     Equipment Recommendations  None recommended by PT    Recommendations for Other Services       Precautions / Restrictions Precautions Precautions: Fall Precaution Comments: wound VAC, glaucoma, legally blind Restrictions Weight Bearing Restrictions: Yes RLE Weight Bearing: Touchdown weight bearing    Mobility  Bed Mobility Overal bed mobility: Needs Assistance Bed Mobility: Supine to Sit;Sit to Supine     Supine to sit: Supervision Sit to supine: Supervision   General bed mobility comments: increased time and effort needed  Transfers Overall transfer level: Needs assistance Equipment used: Rolling walker (2 wheeled) Transfers: Sit to/from Stand Sit to Stand: Min guard         General transfer comment: cueing for safe hand placement and technique to adhere  to [place] precautions, min guard for safety  Ambulation/Gait     Assistive device: Rolling walker (2 wheeled)       General Gait Details: pt initially only pivoting on L LE but then able to take a few small hops at EOB with RW and min A for stability with minor LOBs   Stairs             Wheelchair Mobility    Modified Rankin (Stroke Patients Only)       Balance Overall balance assessment: Needs assistance Sitting-balance support: Feet supported Sitting balance-Leahy Scale: Good     Standing balance support: During functional activity;Single extremity supported;Bilateral upper extremity supported Standing balance-Leahy Scale: Poor                              Cognition Arousal/Alertness: Awake/alert Behavior During Therapy: WFL for tasks assessed/performed Overall Cognitive Status: Impaired/Different from baseline Area of Impairment: Awareness;Safety/judgement                         Safety/Judgement: Decreased awareness of deficits Awareness: Emergent   General Comments: mostly WNL cognitively; however, he did ask the PT several times what the wound VAC was for       Exercises      General Comments        Pertinent Vitals/Pain Pain Assessment: 0-10 Pain Score: 10-Worst pain ever Pain Location: R foot when sitting upright and standing Pain Descriptors / Indicators: Sore;Throbbing Pain Intervention(s):  Monitored during session;Repositioned    Home Living                      Prior Function            PT Goals (current goals can now be found in the care plan section) Acute Rehab PT Goals PT Goal Formulation: With patient Time For Goal Achievement: 06/15/20 Potential to Achieve Goals: Good Progress towards PT goals: Progressing toward goals    Frequency    Min 3X/week      PT Plan Discharge plan needs to be updated    Co-evaluation              AM-PAC PT "6 Clicks" Mobility   Outcome Measure   Help needed turning from your back to your side while in a flat bed without using bedrails?: None Help needed moving from lying on your back to sitting on the side of a flat bed without using bedrails?: None Help needed moving to and from a bed to a chair (including a wheelchair)?: A Little Help needed standing up from a chair using your arms (e.g., wheelchair or bedside chair)?: A Little Help needed to walk in hospital room?: A Lot Help needed climbing 3-5 steps with a railing? : Total 6 Click Score: 17    End of Session Equipment Utilized During Treatment: Gait belt Activity Tolerance: Patient limited by pain Patient left: in bed;with call bell/phone within reach;with bed alarm set;Other (comment) (R LE elevated) Nurse Communication: Mobility status;Precautions;Weight bearing status PT Visit Diagnosis: Difficulty in walking, not elsewhere classified (R26.2);Pain Pain - Right/Left: Right Pain - part of body: Ankle and joints of foot     Time: 5366-4403 PT Time Calculation (min) (ACUTE ONLY): 34 min  Charges:  $Therapeutic Activity: 8-22 mins                     Anastasio Champion, DPT  Acute Rehabilitation Services Pager [phone] Office [place] 06/03/2020, 2:32 PM

## 2020-06-03 NOTE — Plan of Care (Signed)
  Problem: Education: Goal: Knowledge of General Education information will improve Description: Including pain rating scale, medication(s)/side effects and non-pharmacologic comfort measures Outcome: Progressing   Problem: Pain Managment: Goal: General experience of comfort will improve Outcome: Progressing   

## 2020-06-03 NOTE — Discharge Instructions (Signed)
Osteomyelitis, Adult  Bone infections (osteomyelitis) occur when bacteria or other germs get inside a bone. This can happen if you have an infection in another part of your body that spreads through your blood. Germs from your skin or from outside of your body can also cause this type of infection if you have a wound or a broken bone (fracture) that breaks the skin. Bone infections need to be treated quickly to prevent bone damage and to prevent the infection from spreading to other areas of your body. What are the causes? Most bone infections are caused by bacteria. They can also be caused by other germs, such as viruses and funguses. What increases the risk? You are more likely to develop this condition if you:  Recently had surgery, especially bone or joint surgery.  Have a long-term (chronic) disease, such as: ? Diabetes. ? HIV (human immunodeficiency virus). ? Rheumatoid arthritis. ? Sickle cell anemia. ? Kidney disease that requires dialysis.  Are aged 60 years or older.  Have a condition or take medicines that block or weaken your body's defense system (immune system).  Have a condition that reduces your blood flow.  Have an artificial joint.  Have had a joint or bone repaired with plates or screws (surgical hardware).  Use IV drugs.  Have a central line for IV access.  Have had trauma, such as stepping on a nail or a broken bone that came through the skin. What are the signs or symptoms? Symptoms vary depending on the type and location of your infection. Common symptoms of bone infections include:  Fever and chills.  Skin redness and warmth.  Swelling.  Pain and stiffness.  Drainage of fluid or pus near the infection. How is this diagnosed? This condition may be diagnosed based on:  Your symptoms and medical history.  A physical exam.  Tests, such as: ? A sample of tissue, fluid, or blood taken to be examined under a microscope. ? Pus or discharge swabbed  from a wound for testing to identify germs and to determine what type of medicine will kill them (culture and sensitivity). ? Blood tests.  Imaging studies. These may include: ? X-rays. ? MRI. ? CT scan. ? Bone scan. ? Ultrasound. How is this treated? Treatment for this condition depends on the cause and type of infection. Antibiotic medicines are usually the first treatment for a bone infection. This may be done in a hospital at first. You may have to continue IV antibiotics at home or take antibiotics by mouth for several weeks after that. Other treatments may include surgery to remove:  Dead or dying tissue from a bone.  An infected artificial joint.  Infected plates or screws that were used to repair a broken bone. Follow these instructions at home: Medicines   Take over-the-counter and prescription medicines only as told by your health care provider.  Take your antibiotic medicine as told by your health care provider. Do not stop taking the antibiotic even if you start to feel better.  Follow instructions from your health care provider about how to take IV antibiotics at home. You may need to have a nurse come to your home to give you the IV antibiotics. General instructions   Ask your health care provider if you have any restrictions on your activities.  If directed, put ice on the affected area: ? Put ice in a plastic bag. ? Place a towel between your skin and the bag. ? Leave the ice on for 20   minutes, 2-3 times a day.  Wash your hands often with soap and water. If soap and water are not available, use hand sanitizer.  Do not use any products that contain nicotine or tobacco, such as cigarettes and e-cigarettes. These can delay bone healing. If you need help quitting, ask your health care provider.  Keep all follow-up visits as told by your health care provider. This is important. Contact a health care provider if:  You develop a fever or chills.  You have  redness, warmth, pain, or swelling that returns after treatment. Get help right away if:  You have rapid breathing or you have trouble breathing.  You have chest pain.  You cannot drink fluids or make urine.  The affected area swells, changes color, or turns blue.  You have numbness or severe pain in the affected area. Summary  Bone infections (osteomyelitis) occur when bacteria or other germs get inside a bone.  You may be more likely to get this type of infection if you have a condition, such as diabetes, that lowers your ability to fight infection or increases your chances of getting an infection.  Most bone infections are caused by bacteria. They can also be caused by other germs, such as viruses and funguses.  Treatment for this condition usually starts with taking antibiotics. Further treatment depends on the cause and type of infection. This information is not intended to replace advice given to you by your health care provider. Make sure you discuss any questions you have with your health care provider. Document Revised: 12/18/2017 Document Reviewed: 12/11/2017 Elsevier Patient Education  2020 [employer]vier Inc[employer]  

## 2020-06-04 LAB — CBC
HCT: 26.3 % — ABNORMAL LOW (ref 39.0–52.0)
Hemoglobin: 8.3 g/dL — ABNORMAL LOW (ref 13.0–17.0)
MCH: 33.1 pg (ref 26.0–34.0)
MCHC: 31.6 g/dL (ref 30.0–36.0)
MCV: 104.8 fL — ABNORMAL HIGH (ref 80.0–100.0)
Platelets: 216 10*3/uL (ref 150–400)
RBC: 2.51 MIL/uL — ABNORMAL LOW (ref 4.22–5.81)
RDW: 15.9 % — ABNORMAL HIGH (ref 11.5–15.5)
WBC: 5 10*3/uL (ref 4.0–10.5)
nRBC: 0 % (ref 0.0–0.2)

## 2020-06-04 MED ORDER — ACETAMINOPHEN 325 MG PO TABS
650.0000 mg | ORAL_TABLET | Freq: Four times a day (QID) | ORAL | Status: DC
  Administered 2020-06-04 – 2020-06-07 (×14): 650 mg via ORAL
  Filled 2020-06-04 (×12): qty 2

## 2020-06-04 NOTE — Progress Notes (Signed)
[facility] Progress Note   Subjective:  Completed dialysis yesterday. No issues. Only 1L UF d/t low BP on HD. Pain controlled this am. PT recommending SNF for rehab.   Objective Vitals:   06/03/20 1825 06/04/20 0025 06/04/20 0550 06/04/20 0827  BP: 125/64 (!) 143/70 137/73 135/72  Pulse: 72 68 71 67  Resp: 16 16 16 16   Temp: 98 F (36.7 C) 98.5 F (36.9 C) 98.4 F (36.9 C)   TempSrc: Oral Oral Oral   SpO2: 100% 97% 97%   Weight:      Height:       Physical Exam General:  Well developed male in NAD Heart: RRR, no murmurs, rubs or gallops Lungs: CTA b/l without wheezing, rhonchi or rales Abdomen: Soft, non-tender, non-distended. +BS Extremities: No edema b/l lower extremities Dialysis Access:  RUE AVF aneurysmal +bruit   Additional Objective Labs: Basic Metabolic Panel: Recent Labs  Lab 06/01/20 0725 06/02/20 0537 06/03/20 0839  NA 134* 134* 135  K 4.9 4.3 4.7  CL 90* 94* 96*  CO2 31 29 24   GLUCOSE 76 75 64*  BUN 24* 16 29*  CREATININE 10.27* 7.55* 10.20*  CALCIUM 9.5 9.1 8.9  PHOS 4.0 3.8 4.6   Liver Function Tests: Recent Labs  Lab 05/30/20 1417 05/31/20 0751 06/01/20 0725 06/02/20 0537 06/03/20 0839  AST 17  --   --   --   --   ALT 19  --   --   --   --   ALKPHOS 96  --   --   --   --   BILITOT 0.8  --   --   --   --   PROT 7.3  --   --   --   --   ALBUMIN 3.2*   < > 2.7* 2.6* 2.4*   < > = values in this interval not displayed.   CBC: Recent Labs  Lab 05/30/20 1417 05/30/20 1417 05/31/20 0751 05/31/20 0751 06/01/20 0725 06/01/20 0725 06/02/20 0537 06/03/20 0839 06/04/20 0212  WBC 6.9   < > 6.7   < > 5.9   < > 5.3 5.9 5.0  NEUTROABS 4.5  --   --   --   --   --   --   --   --   HGB 9.4*   < > 9.9*   < > 8.7*   < > 8.4* 8.4* 8.3*  HCT 30.7*   < > 31.3*   < > 27.5*   < > 26.6* 26.6* 26.3*  MCV 107.3*   < > 105.4*  --  104.6*  --  105.1* 105.6* 104.8*  PLT 259   < > 267   < > 255   < > 221 233 216   < > = values in this  interval not displayed.    Studies/Results: VAS [place] ABI WITH/WO TBI  Result Date: 06/02/2020 LOWER EXTREMITY DOPPLER STUDY Indications: Right 5th digit amputation 06/02/20, osteomyelitis. Other Factors: Palpable pedal pulses.  Performing Technologist: June Leap Rdms, Rvt  Examination Guidelines: A complete evaluation includes at minimum, Doppler waveform signals and systolic blood pressure reading at the level of bilateral brachial, anterior tibial, and posterior tibial arteries, when vessel segments are accessible. Bilateral testing is considered an integral part of a complete examination. Photoelectric Plethysmograph (PPG) waveforms and toe systolic pressure readings are included as required and additional duplex testing as needed. Limited examinations for reoccurring indications may be performed as noted.  ABI Findings: +---------+------------------+-----+---------+--------------------------------+  Right     Rt Pressure (mmHg) Index Waveform  Comment                           +---------+------------------+-----+---------+--------------------------------+  Brachial                                     Not obtained due to restricted                                                  arm                               +---------+------------------+-----+---------+--------------------------------+  ATA       255                1.90  triphasic                                   +---------+------------------+-----+---------+--------------------------------+  PTA       255                1.90  triphasic                                   +---------+------------------+-----+---------+--------------------------------+  Great Toe                                    right foot bandaged post surgery  +---------+------------------+-----+---------+--------------------------------+ +--------+------------------+-----+---------+-------+  Left     Lt Pressure (mmHg) Index Waveform  Comment   +--------+------------------+-----+---------+-------+  Brachial 134                      triphasic          +--------+------------------+-----+---------+-------+  ATA      255                1.90  triphasic          +--------+------------------+-----+---------+-------+  PTA      255                1.90  triphasic          +--------+------------------+-----+---------+-------+  Summary: Right: Resting right ankle-brachial index indicates noncompressible right lower extremity arteries. However, multiphasic pedal waveforms suggest adequate arterial flow. Left: Resting left ankle-brachial index indicates noncompressible left lower extremity arteries. However, multiphasic pedal waveforms suggest adequate arterial flow.  *See table(s) above for measurements and observations.  Electronically signed by Servando Snare MD on 06/02/2020 at 4:23:14 PM.   Final    Medications:  sodium chloride 10 mL/hr at 06/02/20 1007   sodium chloride 75 mL/hr at 06/04/20 0602    acetaminophen  650 mg Oral Q6H   brimonidine  1 drop Both Eyes BID   chlorhexidine  15 mL Mouth/Throat Once   Chlorhexidine Gluconate Cloth  6 each Topical Q0600   cinacalcet  30 mg Oral Q supper   docusate sodium  100 mg Oral BID   dorzolamide-timolol  1 drop  Both Eyes BID   latanoprost  1 drop Both Eyes QHS   multivitamin  1 tablet Oral Q lunch   Netarsudil Dimesylate  1 drop Both Eyes Daily   senna-docusate  1 tablet Oral BID   sevelamer carbonate  4,000 mg Oral TID WC   sodium chloride flush  3 mL Intravenous Once    Dialysis Orders: Center: [facility] Dt on TTS. TTS 180NRe, BFR 400/DFR 800, EDW 60kg, 2K/2Ca, time 3.5hr,  RUE AVF, heparin 1800 unit bolus mircera 139mcg IVP q 2 weeks- last dose 23mcg on 05/27/20 Calcitriol 1.75 mcg PO q HD Sensipar30mg  daily Binder: Renvela 5 tabs TID with meals  Assessment/Plan: 1.  Osteomyelitis: Seen by ortho, s/p right  5th ray amputation on 6/18. Now awaiting SNF placement.   2.  ESRD:  TTS schedule.  Please avoid gadolium contrast for all ESRD patients, the consequences can be devastating. Okay to use R hand (AVF arm) for blood draws only after all other options have been exhausted. . We can draw labs on dialysis on HD days per pt request.   Patient does need assistance removing tape from AVF after dialysis due to vision impairment.  Advised we can remove dressing while rounding in the morning. Next HD 6/21.  3. Hypertension/volume: BP controlled. Not on BP meds. BP tends to run low during dialysis. UF to EDW next HD.  4.  Anemia: Hgb 9.9 > 8.7 >8.4  ESA dose recently increased and due 6/26. Check Fe studies.  5. Metabolic bone disease: Corr calcium high, hold calctriol and follow. Phos ok. Continue sensipar and binder.  6. Nutrition:  Renal diet with fluid restrictions  Lynnda Child PA-C [facility] 06/04/2020,8:32 AM

## 2020-06-04 NOTE — Progress Notes (Signed)
NAME:  Timothy Ponce, MRN:  [id number], DOB:  1956/10/15, LOS: 5 ADMISSION DATE:  05/30/2020  Subjective  No significant overnight events. Notes that his foot feels the best it has since before he came in.  Discussed PT recs for short term rehab at [facility] which he is agreeable to.  Significant Hospital Events   6/15 admission 6/16 MRI showing osteomyelitis with abscess formation. Dr. Sharol Given planning for surgery on 6/18 6/17 HD UF 500cc 6/18 surgery--5th ray amputation 6/19 no significant events; PT recommending SNF  6/20 awaiting SNF placement  Objective   Blood pressure (!) 143/70, pulse 68, temperature 98.5 F (36.9 C), temperature source Oral, resp. rate 16, height 5' 8.75" (1.746 m), weight 63.1 kg, SpO2 97 %.     Intake/Output Summary (Last 24 hours) at 06/04/2020 0508 Last data filed at 06/03/2020 2300 Gross per 24 hour  Intake 1990.49 ml  Output 1000 ml  Net 990.49 ml    Examination: General: in NAD HEENT: hoarse voice Cardiac: RRR. R AVF Pulm: breathing comfortably on room air. Lungs clear MSK: wound vac to RLE  Consults:  ortho nephrology Significant Diagnostic Tests:  R foot xray>>lucency in the 5th metatarsal head concerning for possible acute osteomyelitis.  R foot MRI>>area of superfiical ulceration with probable soft tissue abscess overlying the 5th metatarsal head measuring 1.7x0.6x1.5cm. findings suggestive of osteomyelitis involving the 5th metatarsal head.  Micro Data:  6/15 blood culture x2>>NG  Antimicrobials:  Vancomycin 6/15>>6/19  Summary  64 yo male with ESRD on HD (THS) who was admitted to [facility] 05/30/20 for 3w history of right foot pain. MRI revealed findings consistent with osteomyelitis of the 5th metatarsal head along with abscess formation. He underwent a 5th ray amputation on 6/18. PT has evaluated and is recommending SNF at this time.  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 As seen on MRI.  Remains afebrile. NG on blood cultures. Vancomycin 6/15-6/19 POD #2 s/p right 5th ray amputation Plan --Ortho recs: continue wound VAC at discharge; ortho f/u one week after discharge; touch down weight bearing --Pain management: scheduled tylenol. prn 1mg  dilaudid q3h--encourage po. Senokot, colace bowel management. Prn reglan for nausea. --Continue PT/OT --TOC consult for assistance with SNF placement  ESRD on HD (THS). Management per nephrology. Monitor renal function.   Macrocytic anemia. MCV 105. B12 normal. hgb stable. Continue to monitor. Transfuse for hgb <7.  Best practice:  CODE STATUS: FULL Diet: renal DVT for prophylaxis: SCDs. Will discuss resuming pharmaceutical prophylaxis with ortho Dispo: SNF   Mitzi Hansen, MD [facility] PGY-1 Contact info:  Please page me at [phone] from 7am-5pm M-F.  Please use the [facility] after hours pager at [phone] after 5pm M-F and on weekends 06/04/20  5:08 AM  Labs    CBC Latest Ref Rng & Units 06/04/2020 06/03/2020 06/02/2020  WBC 4.0 - 10.5 K/uL 5.0 5.9 5.3  Hemoglobin 13.0 - 17.0 g/dL 8.3(L) 8.4(L) 8.4(L)  Hematocrit 39 - 52 % 26.3(L) 26.6(L) 26.6(L)  Platelets 150 - 400 K/uL 216 233 221   BMP Latest Ref Rng & Units 06/03/2020 06/02/2020 06/01/2020  Glucose 70 - 99 mg/dL 64(L) 75 76  BUN 8 - 23 mg/dL 29(H) 16 24(H)  Creatinine 0.61 - 1.24 mg/dL 10.20(H) 7.55(H)  10.27(H)  Sodium 135 - 145 mmol/L 135 134(L) 134(L)  Potassium 3.5 - 5.1 mmol/L 4.7 4.3 4.9  Chloride 98 - 111 mmol/L 96(L) 94(L) 90(L)  CO2 22 - 32 mmol/L 24 29 31   Calcium 8.9 - 10.3 mg/dL 8.9 9.1 9.5

## 2020-06-04 NOTE — Discharge Summary (Addendum)
Name: Timothy Ponce MRN: [id number] DOB: [birth year] 64 y.o. PCP: Patient, No Pcp Per  Date of Admission: 05/30/2020  1:54 PM Date of Discharge: 06/07/20 Attending Physician: Lucious Groves, DO  Discharge Diagnosis: 1. Osteomyelitis of right 5th metatarsal with abscess formation 2. S/p amputation of 5th metatarsal 3. ESRD on HD 4. Peripheral arterial disease  Discharge Medications: Allergies as of 06/07/2020       Reactions   Darvon [propoxyphene] Other (See Comments)   Lungs filling up with fluid   Iron Dextran    Other reaction(s): Breathing Problems   Lac Bovis    Other reaction(s): Unknown   Lactose Intolerance (gi)    unknown        Medication List     STOP taking these medications    Sensipar 30 MG tablet Generic drug: cinacalcet       TAKE these medications    acetaminophen 650 MG CR tablet Commonly known as: TYLENOL Take 650 mg by mouth every 8 (eight) hours as needed for pain (At the time of dialysis).   Alphagan P 0.1 % Soln Generic drug: brimonidine Place 1 drop into both eyes in the morning and at bedtime.   dorzolamide-timolol 22.3-6.8 MG/ML ophthalmic solution Commonly known as: COSOPT Place 1 drop into both eyes 2 (two) times daily.   ibuprofen 200 MG tablet Commonly known as: ADVIL Take 600 mg by mouth every 6 (six) hours as needed for moderate pain.   Lumigan 0.01 % Soln Generic drug: bimatoprost Place 1 drop into both eyes at bedtime.   MIRCERA IJ Mircera   multivitamin Tabs tablet Take 1 tablet by mouth daily.   Rhopressa 0.02 % Soln Generic drug: Netarsudil Dimesylate Place 1 drop into both eyes daily.   sevelamer carbonate 800 MG tablet Commonly known as: RENVELA Take 1,600-4,000 mg by mouth See admin instructions. Takes 5 tablets with each meal and 2 tablets snacks.               Discharge Care Instructions  (From admission, onward)           Start     Ordered   06/07/20 0000  Discharge wound care:        Comments: Keep wound-vac site clean and ensure vac pump on   06/07/20 1129            Disposition and follow-up:   Timothy Ponce was discharged from [facility] in Stable condition.  At the hospital follow up visit please address:  1.  Osteomyelitis of right fifth metatarsal status post amputation of fifth metatarsal by Dr. Sharol Given on 06/02/2020.  Underwent antibiotic treatment 6/15-6/19.  No growth on blood cultures at time of discharge. --Pain management: tylenol and ibuprofen ---Follow-up with Dr. Sharol Given in 1 week after discharge --Continue wound VAC until Ortho follow-up --Monitor for signs and symptoms of action. --Continue physical therapy with goal to return home   2.  End-stage renal disease on dialysis (TTS).  3. Peripheral arterial disease Asymptomatic by patient report. Calcified arterial vessels noted on xray of right foot, [place] ABI were completed after amputation thus did not allow for TBI and unfortunately vessels were to calcified to get true measurements.  Discussed with patient to resume walking exercise once able.  We also evaluated for hyperlipidemia however with an LDL of 42 (and patient on HD) we did not prescribe statin therapy.  No antiplatelet agents prescribed due to patient asymptomatic.  Labs / imaging needed at  time of follow-up: CBC  Pending labs/ test needing follow-up: None  Follow-up Appointments:  Follow-up Information     Persons, Bevely Palmer, [place] In 1 week.   Specialty: Orthopedic Surgery Contact information: [place] [address] [phone]                 Hospital Course: 64 year old male with end-stage renal disease on dialysis (TTS) who presented to [facility] emergency department on 05/30/2020 for evaluation of a 2-week history of the right foot wound associated with pain.  He had been at dialysis earlier that day when he mentioned it to them and after they looked at it he was told to report  to the ED for further evaluation. Imaging while in the ED revealed osteomyelitis of the fifth metatarsal head with an abscess formation.  He was placed on IV vancomycin and orthopedics was consulted.  He underwent amputation of the fifth metatarsal on 2/42 without complication.  Antibiotics were discontinued 24 hours after surgery on 6/19.  His pain was well managed with Dilaudid postoperatively and was only requiring tylenol and ibuprofen by post op day #3.  He is evaluated by physical therapy during his hospitalization who recommended short-term SNF placement for rehabilitation which patient was amenable to.  We did attempt to do a work-up for this nonhealing foot ulcer.  A1c was normal at 4.9.  We also attempted to obtain ABIs however unfortunately, these were not obtained until postoperatively at which time patient was unable to tolerate them.  Discharge Vitals:   BP 122/62 (BP Location: Left Arm)   Pulse 68   Temp 98.2 F (36.8 C) (Oral)   Resp 18   Ht 5' 8.75" (1.746 m)   Wt 63.7 kg   SpO2 99%   BMI 20.89 kg/m   Pertinent Labs, Studies, and Procedures:  R foot xray>>lucency in the 5th metatarsal head concerning for possible acute osteomyelitis.  R foot MRI>>area of superfiical ulceration with probable soft tissue abscess overlying the 5th metatarsal head measuring 1.7x0.6x1.5cm. findings suggestive of osteomyelitis involving the 5th metatarsal head.  CBC Latest Ref Rng & Units 06/06/2020 06/05/2020 06/04/2020  WBC 4.0 - 10.5 K/uL 4.0 4.6 5.0  Hemoglobin 13.0 - 17.0 g/dL 8.2(L) 8.0(L) 8.3(L)  Hematocrit 39 - 52 % 25.9(L) 25.2(L) 26.3(L)  Platelets 150 - 400 K/uL 217 197 216   BMP Latest Ref Rng & Units 06/06/2020 06/05/2020 06/03/2020  Glucose 70 - 99 mg/dL 75 88 64(L)  BUN 8 - 23 mg/dL 31(H) 23 29(H)  Creatinine 0.61 - 1.24 mg/dL 12.20(H) 9.67(H) 10.20(H)  Sodium 135 - 145 mmol/L 135 137 135  Potassium 3.5 - 5.1 mmol/L 4.1 3.9 4.7  Chloride 98 - 111 mmol/L 98 101 96(L)  CO2 22 - 32  mmol/L 25 26 24   Calcium 8.9 - 10.3 mg/dL 8.8(L) 8.8(L) 8.9     Discharge Instructions: Discharge Instructions     Call MD for:  difficulty breathing, headache or visual disturbances   Complete by: As directed    Call MD for:  persistant dizziness or light-headedness   Complete by: As directed    Call MD for:  persistant nausea and vomiting   Complete by: As directed    Call MD for:  severe uncontrolled pain   Complete by: As directed    Call MD for:  temperature >100.4   Complete by: As directed    Diet - low sodium heart healthy   Complete by: As directed    Discharge instructions  Complete by: As directed    Dear [name]  You came to [place] with foot pain. We have determined this was caused by osteomyelitis. Here are our recommendations for you at discharge:  Please take dilaudid 2mg  as needed for pain Make sure to follow up with Dr.Duda's office in 1 week to get your wound vac changed Please avoid weight on your foot as much as possible to prevent complications after the surgery  Thank you for choosing [facility]   Discharge wound care:   Complete by: As directed    Keep wound-vac site clean and ensure vac pump on   Increase activity slowly   Complete by: As directed    Negative Pressure Wound Therapy - Incisional   Complete by: As directed    Show patient how to attach prevena pump       Signed: Mitzi Hansen, MD 06/07/2020, 11:50 AM   Pager: [phone]

## 2020-06-05 DIAGNOSIS — D62 Acute posthemorrhagic anemia: Secondary | ICD-10-CM

## 2020-06-05 LAB — RENAL FUNCTION PANEL
Albumin: 2.2 g/dL — ABNORMAL LOW (ref 3.5–5.0)
Anion gap: 10 (ref 5–15)
BUN: 23 mg/dL (ref 8–23)
CO2: 26 mmol/L (ref 22–32)
Calcium: 8.8 mg/dL — ABNORMAL LOW (ref 8.9–10.3)
Chloride: 101 mmol/L (ref 98–111)
Creatinine, Ser: 9.67 mg/dL — ABNORMAL HIGH (ref 0.61–1.24)
GFR calc Af Amer: 6 mL/min — ABNORMAL LOW (ref >60)
GFR calc non Af Amer: 5 mL/min — ABNORMAL LOW (ref >60)
Glucose, Bld: 88 mg/dL (ref 70–99)
Phosphorus: 4.3 mg/dL (ref 2.5–4.6)
Potassium: 3.9 mmol/L (ref 3.5–5.1)
Sodium: 137 mmol/L (ref 135–145)

## 2020-06-05 LAB — CULTURE, BLOOD (ROUTINE X 2)
Culture: NO GROWTH
Culture: NO GROWTH
Special Requests: ADEQUATE
Special Requests: ADEQUATE

## 2020-06-05 LAB — CBC
HCT: 25.2 % — ABNORMAL LOW (ref 39.0–52.0)
Hemoglobin: 8 g/dL — ABNORMAL LOW (ref 13.0–17.0)
MCH: 33.1 pg (ref 26.0–34.0)
MCHC: 31.7 g/dL (ref 30.0–36.0)
MCV: 104.1 fL — ABNORMAL HIGH (ref 80.0–100.0)
Platelets: 197 10*3/uL (ref 150–400)
RBC: 2.42 MIL/uL — ABNORMAL LOW (ref 4.22–5.81)
RDW: 15.9 % — ABNORMAL HIGH (ref 11.5–15.5)
WBC: 4.6 10*3/uL (ref 4.0–10.5)
nRBC: 0 % (ref 0.0–0.2)

## 2020-06-05 LAB — FERRITIN: Ferritin: 900 ng/mL — ABNORMAL HIGH (ref 24–336)

## 2020-06-05 LAB — IRON AND TIBC
Iron: 22 ug/dL — ABNORMAL LOW (ref 45–182)
Saturation Ratios: 15 % — ABNORMAL LOW (ref 17.9–39.5)
TIBC: 147 ug/dL — ABNORMAL LOW (ref 250–450)
UIBC: 125 ug/dL

## 2020-06-05 MED ORDER — CHLORHEXIDINE GLUCONATE CLOTH 2 % EX PADS: 6.0000 | MEDICATED_PAD | Freq: Every day | CUTANEOUS | Status: DC

## 2020-06-05 MED ORDER — HEPARIN SODIUM (PORCINE) 5000 UNIT/ML IJ SOLN
5000.0000 [IU] | Freq: Three times a day (TID) | INTRAMUSCULAR | Status: DC
  Administered 2020-06-05 – 2020-06-07 (×6): 5000 [IU] via SUBCUTANEOUS
  Filled 2020-06-05 (×6): qty 1

## 2020-06-05 NOTE — NC FL2 (Addendum)
[place] LEVEL OF CARE SCREENING TOOL     IDENTIFICATION  Patient Name: Timothy Ponce Birthdate: Aug 05, 1956 Sex: male Admission Date (Current Location): 05/30/2020  [place] and [place] Number:  Kathleen Argue   [id number] [place] and Address:  [facility], [place] [address], [place], [place] [address]      Provider Number: [id number]  Attending Physician Name and Address:  Lucious Groves, DO  Relative Name and Phone Number:       Current Level of Care: Hospital Recommended Level of Care: [place] Prior Approval Number:    Date Approved/Denied:   PASRR Number: [id number]  Discharge Plan:      Current Diagnoses: Patient Active Problem List   Diagnosis Date Noted  . ESRD on hemodialysis ([place]) 05/31/2020  . Anemia of chronic renal failure 05/31/2020  . Peripheral arterial disease ([place]) 05/31/2020  . Osteomyelitis of fifth toe of right foot ([place]) 05/30/2020    Orientation RESPIRATION BLADDER Height & Weight     Self, Time, Situation, Place  Normal Continent Weight: 139 lb 1.8 oz (63.1 kg) Height:  5' 8.75" (174.6 cm)  BEHAVIORAL SYMPTOMS/MOOD NEUROLOGICAL BOWEL NUTRITION STATUS      Continent Diet (see discharge summary)  AMBULATORY STATUS COMMUNICATION OF NEEDS Skin   Extensive Assist Verbally Surgical wounds, Wound Vac, Other (Comment) (av fistula; right toe amputations w/ prevena wound vac)                       Personal Care Assistance Level of Assistance  Bathing, Feeding, Dressing Bathing Assistance: Maximum assistance Feeding assistance: Independent Dressing Assistance: Maximum assistance     Functional Limitations Info  Sight, Hearing, Speech Sight Info: Adequate Hearing Info: Adequate Speech Info: Adequate    SPECIAL CARE FACTORS FREQUENCY  OT (By licensed OT), PT (By licensed PT)     PT Frequency: 5x week OT Frequency: 5x week            Contractures Contractures Info: Not present     Additional Factors Info  Code Status, Allergies Code Status Info: Full Code Allergies Info: Darvon (Propoxyphene), Iron Dextran, Lac Bovis, Lactose Intolerance (Gi)           Current Medications (06/05/2020):  This is the current hospital active medication list Current Facility-Administered Medications  Medication Dose Route Frequency Provider Last Rate Last Admin  . 0.9 %  sodium chloride infusion   Intravenous Continuous Persons, Bevely Palmer, [place] 10 mL/hr at 06/02/20 1007 Continued from Pre-op at 06/02/20 1007  . acetaminophen (TYLENOL) tablet 650 mg  650 mg Oral Q6H Christian, Rylee, MD   650 mg at 06/05/20 0821  . brimonidine (ALPHAGAN) 0.15 % ophthalmic solution 1 drop  1 drop Both Eyes BID Persons, Bevely Palmer, [place]   1 drop at 06/05/20 0814  . chlorhexidine (PERIDEX) 0.12 % solution 15 mL  15 mL Mouth/Throat Once Persons, Bevely Palmer, [place]      . Chlorhexidine Gluconate Cloth 2 % PADS 6 each  6 each Topical Q0600 Persons, Bevely Palmer, [place]   6 each at 06/05/20 0542  . cinacalcet (SENSIPAR) tablet 30 mg  30 mg Oral Q supper Persons, Bevely Palmer, PA   30 mg at 06/04/20 1732  . docusate sodium (COLACE) capsule 100 mg  100 mg Oral BID Persons, Bevely Palmer, PA   100 mg at 06/05/20 9030  . dorzolamide-timolol (COSOPT) 22.3-6.8 MG/ML ophthalmic solution 1 drop  1 drop Both Eyes BID Persons, Stanton Kidney  Anne, PA   1 drop at 06/05/20 0816  . heparin injection 5,000 Units  5,000 Units Subcutaneous Q8H Christian, Rylee, MD      . HYDROmorphone (DILAUDID) tablet 1 mg  1 mg Oral Q3H PRN Mosetta Anis, MD   1 mg at 06/03/20 1251  . latanoprost (XALATAN) 0.005 % ophthalmic solution 1 drop  1 drop Both Eyes QHS Persons, Bevely Palmer, [place]   1 drop at 06/04/20 2156  . metoCLOPramide (REGLAN) tablet 5-10 mg  5-10 mg Oral Q8H PRN Persons, Bevely Palmer, PA       Or  . metoCLOPramide (REGLAN) injection 5-10 mg  5-10 mg Intravenous Q8H PRN Persons, Bevely Palmer, [place]      . multivitamin (RENA-VIT) tablet 1 tablet  1 tablet Oral Q lunch  Persons, Bevely Palmer, [place]   1 tablet at 06/05/20 1211  . Netarsudil Dimesylate 0.02 % SOLN 1 drop  1 drop Both Eyes Daily Persons, Bevely Palmer, [place]   1 drop at 06/04/20 2158  . ondansetron (ZOFRAN) tablet 4 mg  4 mg Oral Q6H PRN Persons, Bevely Palmer, PA       Or  . ondansetron [facility]) injection 4 mg  4 mg Intravenous Q6H PRN Persons, Bevely Palmer, PA      . senna-docusate (Senokot-S) tablet 1 tablet  1 tablet Oral BID Persons, Bevely Palmer, [place]   1 tablet at 06/05/20 [phone]  . sevelamer carbonate (RENVELA) tablet 1,600 mg  1,600 mg Oral PRN Persons, Bevely Palmer, PA      . sevelamer carbonate (RENVELA) tablet 4,000 mg  4,000 mg Oral TID WC Persons, Bevely Palmer, PA   4,000 mg at 06/05/20 1211  . sodium chloride flush (NS) 0.9 % injection 3 mL  3 mL Intravenous Once Persons, Bevely Palmer, PA         Discharge Medications: Please see discharge summary for a list of discharge medications.  Relevant Imaging Results:  Relevant Lab Results:   Additional Information SS#[id number]; TTS at [facility]. seat time is 8:20am. He needs to arrive at [place], LCSW

## 2020-06-05 NOTE — Care Management (Signed)
Spoke w patient at the bedside to discuss disposition.  He states that he lives w his 65 year old mother who will not be able to give him the support he needs. He has proveena VAC to foot and would need to go up and down the stairs at his house for access to bedroom and bathroom multiple times a day and would also have difficulty getting to HD. HD is TTS [facility].  Patient does not have preference for facility at this time [facility] will begin SNF referral

## 2020-06-05 NOTE — Progress Notes (Addendum)
NAME:  Timothy Ponce, MRN:  [id number], DOB:  [birth year], LOS: 6 ADMISSION DATE:  05/30/2020  Subjective  No significant overnight events  Patient evaluated at bedside this morning. He notes improvement in his pain this morning with going approximately 7 hours between pain medications. He notes having a terrible stomachache overnight, believes it was attributed to tylenol. Discussed that this was likely not the case, but likely from being constipated.  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  Patient is concerned about getting his eye drops on time for his vision.  Significant Hospital Events   6/15 admission 6/16 MRI showing osteomyelitis with abscess formation. Dr. Sharol Given planning for surgery on 6/18 6/17 HD UF 500cc 6/18 surgery--5th ray amputation 6/19 no significant events; PT recommending SNF  6/20 awaiting SNF placement 6/21 awaiting SNF placement  Objective   Blood pressure 129/67, pulse 70, temperature 99.2 F (37.3 C), temperature source Oral, resp. rate 19, height 5' 8.75" (1.746 m), weight 63.1 kg, SpO2 97 %.     Intake/Output Summary (Last 24 hours) at 06/05/2020 0528 Last data filed at 06/05/2020 0306 Gross per 24 hour  Intake 480 ml  Output --  Net 480 ml    Examination: General: in NAD Cardiac: extremities warm.  Pulm: breathing comfortably on room air MSK: wound vac to RLE  Skin: skin warmth equal in bilateral lower extremities  Consults:  ortho nephrology Significant Diagnostic Tests:  R foot xray>>lucency in the 5th metatarsal head concerning for possible acute osteomyelitis.  R foot MRI>>area of superfiical ulceration with probable soft tissue abscess overlying the 5th metatarsal head measuring 1.7x0.6x1.5cm. findings suggestive of osteomyelitis involving the 5th metatarsal head.  Micro Data:  6/15 blood culture x2>>NG  Antimicrobials:  Vancomycin 6/15>>6/19  Summary  64 yo male with  ESRD on HD (THS) who was admitted to IMTS 05/30/20 for 3w history of right foot pain. MRI revealed findings consistent with osteomyelitis of the 5th metatarsal head along with abscess formation. He underwent a 5th ray amputation on 6/18. PT has evaluated and is recommending SNF at this time which patient is amenable to. Transitions of care team has been consulted to assist with placement.  Assessment & Plan:  Principal Problem:   Osteomyelitis of fifth toe of right foot (HCC) Active Problems:   ESRD on hemodialysis (HCC)   Anemia of chronic renal failure   Peripheral arterial disease (HCC)  Osteomyelitis of the 5th metatarsal head with soft tissue abscess.  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 NG on blood cultures. Minimal wound vac output Plan --Ortho recs: continue wound VAC at discharge; ortho f/u one week after discharge; touch down weight bearing --Pain management: scheduled tylenol. Prn dilaudid--pt not receiving tablet so will d/c IV dilaudid for now to encourage transition to oral pain medication. Senokot, colace bowel management. Prn reglan for nausea. --Continue PT/OT  ESRD on HD (THS). Pt noting some worsening neuropathy in his hands which I'm thinking is attributable to some increased swelling in them. He is noting that he did have decreased volume removal at last HD due to hypotension. Management per nephrology. Monitor renal function and volume status.   Post operative anemia superimposed on ACD.   hgb slowly trending down as expected post op. Continue to monitor. Transfuse for hgb <7.  Best practice:  CODE STATUS: FULL Diet: renal DVT for prophylaxis: Subcutaneous heparin 5000 units every 8 hours  Dispo: SNF   Mitzi Hansen, MD INTERNAL  MEDICINE RESIDENT PGY-1 Contact info:  Please page me at [phone] from 7am-5pm M-F.  Please use the IMTS after hours pager at [phone] after 5pm M-F and on weekends 06/05/20  5:28 AM  Labs    CBC Latest  Ref Rng & Units 06/04/2020 06/03/2020 06/02/2020  WBC 4.0 - 10.5 K/uL 5.0 5.9 5.3  Hemoglobin 13.0 - 17.0 g/dL 8.3(L) 8.4(L) 8.4(L)  Hematocrit 39 - 52 % 26.3(L) 26.6(L) 26.6(L)  Platelets 150 - 400 K/uL 216 233 221   BMP Latest Ref Rng & Units 06/03/2020 06/02/2020 06/01/2020  Glucose 70 - 99 mg/dL 64(L) 75 76  BUN 8 - 23 mg/dL 29(H) 16 24(H)  Creatinine 0.61 - 1.24 mg/dL 10.20(H) 7.55(H) 10.27(H)  Sodium 135 - 145 mmol/L 135 134(L) 134(L)  Potassium 3.5 - 5.1 mmol/L 4.7 4.3 4.9  Chloride 98 - 111 mmol/L 96(L) 94(L) 90(L)  CO2 22 - 32 mmol/L 24 29 31   Calcium 8.9 - 10.3 mg/dL 8.9 9.1 9.5

## 2020-06-05 NOTE — Progress Notes (Signed)
S/p  Right foot 5th ray amputation. No complaints denies pain at this time  VSS afebrile. Alert comfortable. pravena vac functiong 0cc  A/p  Stable will need 1 week follow up with ortho outpatient

## 2020-06-05 NOTE — Progress Notes (Addendum)
[facility] Progress Note   Subjective:   Patient seen in room. Reports hands feel more swollen which is worsening his neuropathy. Denies SOB, orthopnea, CP, palpitations, abdominal pain, N/V/D. Foot pain improved.   Objective Vitals:   06/04/20 1207 06/04/20 1732 06/04/20 2343 06/05/20 0624  BP: 113/62 125/67 129/67 130/65  Pulse: 65 61 70 64  Resp: 18  19 17   Temp: 98.9 F (37.2 C)  99.2 F (37.3 C) 98.2 F (36.8 C)  TempSrc: Oral  Oral Oral  SpO2: 98% 99% 97% 98%  Weight:      Height:       Physical Exam General:  Well developed male in NAD Heart: RRR, no murmurs, rubs or gallops Lungs: CTA b/l without wheezing, rhonchi or rales Abdomen: Soft, non-tender, non-distended. +BS Extremities: Trace pedal edema b/l lower extremities, wound vac R foot Dialysis Access:  RUE AVF aneurysmal +bruit   Additional Objective Labs: Basic Metabolic Panel: Recent Labs  Lab 06/02/20 0537 06/03/20 0839 06/05/20 0732  NA 134* 135 137  K 4.3 4.7 3.9  CL 94* 96* 101  CO2 29 24 26   GLUCOSE 75 64* 88  BUN 16 29* 23  CREATININE 7.55* 10.20* 9.67*  CALCIUM 9.1 8.9 8.8*  PHOS 3.8 4.6 4.3   Liver Function Tests: Recent Labs  Lab 05/30/20 1417 05/31/20 0751 06/02/20 0537 06/03/20 0839 06/05/20 0732  AST 17  --   --   --   --   ALT 19  --   --   --   --   ALKPHOS 96  --   --   --   --   BILITOT 0.8  --   --   --   --   PROT 7.3  --   --   --   --   ALBUMIN 3.2*   < > 2.6* 2.4* 2.2*   < > = values in this interval not displayed.   CBC: Recent Labs  Lab 05/30/20 1417 05/31/20 0751 06/01/20 0725 06/01/20 0725 06/02/20 0537 06/02/20 0537 06/03/20 0839 06/04/20 0212 06/05/20 0732  WBC 6.9   < > 5.9   < > 5.3   < > 5.9 5.0 4.6  NEUTROABS 4.5  --   --   --   --   --   --   --   --   HGB 9.4*   < > 8.7*   < > 8.4*   < > 8.4* 8.3* 8.0*  HCT 30.7*   < > 27.5*   < > 26.6*   < > 26.6* 26.3* 25.2*  MCV 107.3*   < > 104.6*  --  105.1*  --  105.6* 104.8* 104.1*   PLT 259   < > 255   < > 221   < > 233 216 197   < > = values in this interval not displayed.   Blood Culture    Component Value Date/Time   SDES BLOOD LEFT ANTECUBITAL 05/31/2020 0751   SPECREQUEST  05/31/2020 0751    BOTTLES DRAWN AEROBIC AND ANAEROBIC Blood Culture adequate volume   CULT  05/31/2020 0751    NO GROWTH 5 DAYS Performed at [facility], [place] [address], [place], [place] [address]    REPTSTATUS 06/05/2020 FINAL 05/31/2020 0751   Iron Studies:  Recent Labs    06/05/20 0732  IRON 22*  TIBC 147*  FERRITIN 900*   Medications: . sodium chloride 10 mL/hr at 06/02/20 1007   .  acetaminophen  650 mg Oral Q6H  . brimonidine  1 drop Both Eyes BID  . chlorhexidine  15 mL Mouth/Throat Once  . Chlorhexidine Gluconate Cloth  6 each Topical Q0600  . cinacalcet  30 mg Oral Q supper  . docusate sodium  100 mg Oral BID  . dorzolamide-timolol  1 drop Both Eyes BID  . heparin  5,000 Units Subcutaneous Q8H  . latanoprost  1 drop Both Eyes QHS  . multivitamin  1 tablet Oral Q lunch  . Netarsudil Dimesylate  1 drop Both Eyes Daily  . senna-docusate  1 tablet Oral BID  . sevelamer carbonate  4,000 mg Oral TID WC  . sodium chloride flush  3 mL Intravenous Once    [facility] Orders[facility], BFR 400/DFR 800, EDW 60kg, 2K/2Ca, time 3.5hr, RUE AVF, heparin 1800 unit bolus mircera 176mcg IVP q 2 weeks- last dose 48mcg on 05/27/20 Calcitriol 1.75 mcg PO q HD Sensipar30mg  daily Binder: Renvela 5 tabs TID with meals  Assessment/Plan: 1. Osteomyelitis:Seen by ortho, s/p right  5th ray amputation on 6/18. Now awaiting SNF placement.  2. ESRD:TTS schedule.  Please avoid gadolium contrast for all ESRD patients, the consequences can be devastating. Okay to use R hand (AVF arm) for blood draws only after all other options have been exhausted. We can draw labs on dialysis on HD days per pt request.  Patient does need assistance  removing tape from AVF after dialysis due to vision impairment.  Advised we can remove dressing while rounding in the morning. Next HD 6/22.  3. Hypertension/volume:BP controlled. Not on BP meds. BP tends to run low during dialysis. Mild excess volume on exam today and 3kg above EDW. UFG 2-2.5L tomorrow as tolerated and will continue to titrate down volume. 4. Anemia:Hgb 9.9 > 8.7 >8.4  ESA dose recently increased and due 6/26. Tsat 15% and ferritin 900. Has allergy to iron dextran (breathing problems) but has tolerated venofer as an outpatient. Does not appear he has ever had ferrelicit. Follow Hgb, if discharging soon will plan to order venofer with outpatient HD at discharge since we know this is well tolerated.  5. Metabolic bone disease:Corr calcium high, hold calctriol and follow. Phos at goal. Continue sensipar and binder (renvela). 6. Nutrition:Renal diet with fluid restrictions 7. Dispo: SNF  Anice Paganini, PA-C 06/05/2020, 9:58 AM  [facility] Pager: [phone]  Patient seen and examined, agree with above note with above modifications. No new c/o's-  Sad to see how vision has deteriorated.  Says pain is better. Will plan for routine HD tomorrow-  Try to be more aggressive with UF as BP is always low at the center  Corliss Parish, MD 06/05/2020

## 2020-06-05 NOTE — Progress Notes (Signed)
Patient wants his family in the room when PT comes and works with him. They have questions as well as some concerns. Family stated that they can be in the hospital tomorrow 6/22 at 1400 if PT can see the patient and family then.

## 2020-06-06 LAB — RENAL FUNCTION PANEL
Albumin: 2.3 g/dL — ABNORMAL LOW (ref 3.5–5.0)
Anion gap: 12 (ref 5–15)
BUN: 31 mg/dL — ABNORMAL HIGH (ref 8–23)
CO2: 25 mmol/L (ref 22–32)
Calcium: 8.8 mg/dL — ABNORMAL LOW (ref 8.9–10.3)
Chloride: 98 mmol/L (ref 98–111)
Creatinine, Ser: 12.2 mg/dL — ABNORMAL HIGH (ref 0.61–1.24)
GFR calc Af Amer: 4 mL/min — ABNORMAL LOW (ref >60)
GFR calc non Af Amer: 4 mL/min — ABNORMAL LOW (ref >60)
Glucose, Bld: 75 mg/dL (ref 70–99)
Phosphorus: 4.5 mg/dL (ref 2.5–4.6)
Potassium: 4.1 mmol/L (ref 3.5–5.1)
Sodium: 135 mmol/L (ref 135–145)

## 2020-06-06 LAB — SARS CORONAVIRUS 2 (TAT 6-24 HRS): SARS Coronavirus 2: NEGATIVE

## 2020-06-06 LAB — CBC
HCT: 25.9 % — ABNORMAL LOW (ref 39.0–52.0)
Hemoglobin: 8.2 g/dL — ABNORMAL LOW (ref 13.0–17.0)
MCH: 33.2 pg (ref 26.0–34.0)
MCHC: 31.7 g/dL (ref 30.0–36.0)
MCV: 104.9 fL — ABNORMAL HIGH (ref 80.0–100.0)
Platelets: 217 10*3/uL (ref 150–400)
RBC: 2.47 MIL/uL — ABNORMAL LOW (ref 4.22–5.81)
RDW: 15.9 % — ABNORMAL HIGH (ref 11.5–15.5)
WBC: 4 10*3/uL (ref 4.0–10.5)
nRBC: 0 % (ref 0.0–0.2)

## 2020-06-06 MED ORDER — ACETAMINOPHEN 325 MG PO TABS
ORAL_TABLET | ORAL | Status: AC
  Filled 2020-06-06: qty 2

## 2020-06-06 MED ORDER — MENTHOL 3 MG MT LOZG
1.0000 | LOZENGE | OROMUCOSAL | Status: DC | PRN
  Administered 2020-06-06: 3 mg via ORAL
  Filled 2020-06-06: qty 9

## 2020-06-06 MED ORDER — IBUPROFEN 200 MG PO TABS
200.0000 mg | ORAL_TABLET | Freq: Four times a day (QID) | ORAL | Status: DC | PRN
  Filled 2020-06-06: qty 1

## 2020-06-06 MED ORDER — HYDROMORPHONE HCL 2 MG PO TABS: 1.0000 mg | ORAL_TABLET | Freq: Four times a day (QID) | ORAL | Status: DC | PRN

## 2020-06-06 NOTE — Progress Notes (Signed)
Physical Therapy Treatment Patient Details Name: Timothy Ponce MRN: [id number] DOB: July 06, 1956 Today's Date: 06/06/2020    History of Present Illness Pt is a 64 yo male presenting with osteomyelitis of his fifth ray s/p amputation on 6/18. 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    PT Comments    Patient progressing slowly towards PT goals. Pain is improved today however pt continues to require assist for transfers and gait training with Min A for safety. Compliant with TDWB throughout activity. Fatigues quickly especially through UEs with hopping technique needing seated rest break after 24 feet. Instructed pt in there ex. After lengthy discussion about disposition, problem solving ability to manage at home and negotiate stairs, pt agrees to SNF to maximize independence and mobility prior to return home. Pt continues to be a fall risk. Pt does not have a conducive environment (bedroom/bathroom on second level) or sufficient support to d/c home at this time due to TDWB status. Will continue to follow.   Follow Up Recommendations  SNF     Equipment Recommendations  None recommended by PT    Recommendations for Other Services       Precautions / Restrictions Precautions Precautions: Fall Precaution Comments: wound VAC, glaucoma, legally blind Restrictions Weight Bearing Restrictions: Yes RLE Weight Bearing: Touchdown weight bearing    Mobility  Bed Mobility Overal bed mobility: Needs Assistance Bed Mobility: Supine to Sit     Supine to sit: Supervision Sit to supine: Supervision   General bed mobility comments: Supervision for safety, use of rail.  Transfers Overall transfer level: Needs assistance Equipment used: Rolling walker (2 wheeled) Transfers: Sit to/from Stand Sit to Stand: Min assist;Min guard         General transfer comment: Min guard-Min A to steady and power to  standing from EOB x1, from chair x1. Cues for foot placement. Transferred to chair post ambulation.  Ambulation/Gait Ambulation/Gait assistance: Min assist;Min guard Gait Distance (Feet): 24 Feet (+ 20') Assistive device: Rolling walker (2 wheeled) Gait Pattern/deviations:  ("hop to") Gait velocity: decreased Gait velocity interpretation: <1.31 ft/sec, indicative of household ambulator General Gait Details: Slow, mildly unsteady hop to gait with cues for RLE placement in knee flexion. 1 seated rest break. Fatigues. Compliant with TDWB status.   Stairs             Wheelchair Mobility    Modified Rankin (Stroke Patients Only)       Balance Overall balance assessment: Needs assistance Sitting-balance support: Feet supported;No upper extremity supported Sitting balance-Leahy Scale: Good Sitting balance - Comments: Assist to donn post op shoe   Standing balance support: During functional activity Standing balance-Leahy Scale: Poor Standing balance comment: Requires UE support in standing.                            Cognition Arousal/Alertness: Awake/alert Behavior During Therapy: WFL for tasks assessed/performed Overall Cognitive Status: No family/caregiver present to determine baseline cognitive functioning Area of Impairment: Following commands;Safety/judgement                       Following Commands: Follows multi-step commands inconsistently Safety/Judgement: Decreased awareness of deficits;Decreased awareness of safety     General Comments: Asks for repetition of plan multiple times during session. Knows he needs rehab even though he wants to go home.  Exercises General Exercises - Lower Extremity Ankle Circles/Pumps: AROM;Both;10 reps;Supine Quad Sets: AROM;Both;10 reps;Supine    General Comments General comments (skin integrity, edema, etc.): Vision is much worse since admission due to poor timing of eye drops/medications.       Pertinent Vitals/Pain Pain Assessment: No/denies pain Faces Pain Scale: Hurts a little bit Pain Location: R foot when sitting upright Pain Descriptors / Indicators: Sore;Throbbing Pain Intervention(s): Limited activity within patient's tolerance;Monitored during session;Repositioned    Home Living                      Prior Function            PT Goals (current goals can now be found in the care plan section) Acute Rehab PT Goals Patient Stated Goal: return home Progress towards PT goals: Progressing toward goals    Frequency    Min 3X/week      PT Plan Current plan remains appropriate    Co-evaluation              AM-PAC PT "6 Clicks" Mobility   Outcome Measure  Help needed turning from your back to your side while in a flat bed without using bedrails?: None Help needed moving from lying on your back to sitting on the side of a flat bed without using bedrails?: None Help needed moving to and from a bed to a chair (including a wheelchair)?: A Little Help needed standing up from a chair using your arms (e.g., wheelchair or bedside chair)?: A Little Help needed to walk in hospital room?: A Little Help needed climbing 3-5 steps with a railing? : Total 6 Click Score: 18    End of Session Equipment Utilized During Treatment: Gait belt Activity Tolerance: Patient tolerated treatment well;Patient limited by fatigue Patient left: in chair;with call bell/phone within reach Nurse Communication: Mobility status PT Visit Diagnosis: Difficulty in walking, not elsewhere classified (R26.2)     Time: 1400-1447 PT Time Calculation (min) (ACUTE ONLY): 47 min  Charges:  $Gait Training: 8-22 mins $Therapeutic Activity: 8-22 mins $Self Care/Home Management: 8-22                     Marisa Severin, PT, DPT Acute Rehabilitation Services Pager [phone] Office [phone]       Marguarite Arbour A Sabra Heck 06/06/2020, 3:21 PM

## 2020-06-06 NOTE — Progress Notes (Signed)
OT Cancellation Note  Patient Details Name: ALWALEED OBESO MRN: [id number] DOB: 18-Sep-1956   Cancelled Treatment:    Reason Eval/Treat Not Completed: Patient at procedure or test/ unavailable. Patient off floor at HD.  Will attempt again later today as time allows.  August Luz, OTR/L   Phylliss Bob 06/06/2020, 11:13 AM

## 2020-06-06 NOTE — Progress Notes (Signed)
NAME:  Timothy Ponce, MRN:  [id number], DOB:  1956-12-14, LOS: 7 ADMISSION DATE:  05/30/2020  Subjective  No overnight events Noting that family is wanting to be present for PT eval today.  Noting that pain has significantly improved  Significant Hospital Events   6/15 admission 6/16 MRI showing osteomyelitis with abscess formation. Dr. Sharol Given planning for surgery on 6/18 6/17 HD UF 500cc 6/18 surgery--5th ray amputation 6/19 no significant events; PT recommending SNF  6/20 awaiting SNF placement 6/21 awaiting SNF placement  Objective   Blood pressure (!) 95/52, pulse (!) 59, temperature 98 F (36.7 C), temperature source Oral, resp. rate 18, height 5' 8.75" (1.746 m), weight 64.8 kg, SpO2 100 %.    No intake or output data in the 24 hours ending 06/06/20 1036  Examination: General: in NAD Cardiac: extremities well perfused Pulm: breathing comfortably on room air MSK: wound vac to RLE  Skin: skin warmth equal in bilateral lower extremities. No lower extremity edema. No erythema above the compression wrap  Consults:  ortho nephrology Significant Diagnostic Tests:  R foot xray>>lucency in the 5th metatarsal head concerning for possible acute osteomyelitis.  R foot MRI>>area of superfiical ulceration with probable soft tissue abscess overlying the 5th metatarsal head measuring 1.7x0.6x1.5cm. findings suggestive of osteomyelitis involving the 5th metatarsal head.  Micro Data:  6/15 blood culture x2>>NG  Antimicrobials:  Vancomycin 6/15>>6/19  Summary  64 yo male with ESRD on HD (THS) who was admitted to IMTS 05/30/20 for 3w history of right foot pain. MRI revealed findings consistent with osteomyelitis of the 5th metatarsal head along with abscess formation. He underwent a 5th ray amputation on 6/18. PT has evaluated and is recommending SNF at this time which patient is amenable to. Transitions of care team has been consulted to assist with placement.  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 NG on blood cultures. No narcotic pain med requirement over past 24h Post op constipation--no BM since admission. Still passing gas but will need to monitor closely Plan --Ortho recs: continue wound VAC at discharge; ortho f/u one week after discharge; touch down weight bearing --Pain management: scheduled tylenol.  Prn ibuprofen and dilaudid. Senokot, colace bowel management. Prn reglan for nausea. --Continue PT/OT  ESRD on HD (THS). Management per nephrology. Monitor renal function and volume status.   Post operative anemia superimposed on ACD.   hgb slowly trending down as expected post op. Continue to monitor. Transfuse for hgb <7.  Best practice:  CODE STATUS: FULL Diet: renal DVT for prophylaxis: Subcutaneous heparin 5000 units every 8 hours  Dispo: SNF   Mitzi Hansen, MD [place] PGY-1 Contact info:  Please page me at [phone] from 7am-5pm M-F.  Please use the IMTS after hours pager at [phone] after 5pm M-F and on weekends 06/06/20  10:36 AM  Labs    CBC Latest Ref Rng & Units 06/06/2020 06/05/2020 06/04/2020  WBC 4.0 - 10.5 K/uL 4.0 4.6 5.0  Hemoglobin 13.0 - 17.0 g/dL 8.2(L) 8.0(L) 8.3(L)  Hematocrit 39 - 52 % 25.9(L) 25.2(L) 26.3(L)  Platelets 150 - 400 K/uL 217 197 216   BMP Latest Ref Rng & Units 06/06/2020 06/05/2020 06/03/2020  Glucose 70 - 99 mg/dL 75 88 64(L)  BUN 8 - 23 mg/dL 31(H) 23 29(H)  Creatinine 0.61 - 1.24 mg/dL 12.20(H) 9.67(H) 10.20(H)  Sodium 135 - 145 mmol/L 135 137 135  Potassium 3.5 - 5.1 mmol/L 4.1 3.9 4.7  Chloride 98 - 111 mmol/L 98 101 96(L)  CO2 22 - 32 mmol/L 25 26 24   Calcium 8.9 - 10.3 mg/dL 8.8(L)  8.8(L) 8.9

## 2020-06-06 NOTE — Progress Notes (Addendum)
[facility] Progress Note   Subjective:   Pt seen on HD, tolerating treatment well. Reports gas/constipation. Denies SOB, CP, palpitations, dizziness. Foot pain well controlled.   Objective Vitals:   06/05/20 1819 06/05/20 1819 06/06/20 0001 06/06/20 0515  BP: 126/65 126/65 101/65 123/63  Pulse: 68 68 62 60  Resp: 16 16 17 18   Temp: 98.1 F (36.7 C) 98.1 F (36.7 C) 98.2 F (36.8 C) 98.2 F (36.8 C)  TempSrc: Oral Oral Oral Oral  SpO2: 100% 100% 100% 100%  Weight:      Height:       Physical Exam General:Well developed male in NAD Heart: RRR, no murmurs, rubs or gallops Lungs: CTA b/l without wheezing, rhonchi or rales Abdomen: Soft, non-tender, non-distended. +BS Extremities: Trace pedal edema b/l lower extremities, wound vac R foot Dialysis Access: RUE AVF aneurysmal, currently accessed  Additional Objective Labs: Basic Metabolic Panel: Recent Labs  Lab 06/02/20 0537 06/03/20 0839 06/05/20 0732  NA 134* 135 137  K 4.3 4.7 3.9  CL 94* 96* 101  CO2 29 24 26   GLUCOSE 75 64* 88  BUN 16 29* 23  CREATININE 7.55* 10.20* 9.67*  CALCIUM 9.1 8.9 8.8*  PHOS 3.8 4.6 4.3   Liver Function Tests: Recent Labs  Lab 05/30/20 1417 05/31/20 0751 06/02/20 0537 06/03/20 0839 06/05/20 0732  AST 17  --   --   --   --   ALT 19  --   --   --   --   ALKPHOS 96  --   --   --   --   BILITOT 0.8  --   --   --   --   PROT 7.3  --   --   --   --   ALBUMIN 3.2*   < > 2.6* 2.4* 2.2*   < > = values in this interval not displayed.   CBC: Recent Labs  Lab 05/30/20 1417 05/31/20 0751 06/01/20 0725 06/01/20 0725 06/02/20 0537 06/02/20 0537 06/03/20 0839 06/04/20 0212 06/05/20 0732  WBC 6.9   < > 5.9   < > 5.3   < > 5.9 5.0 4.6  NEUTROABS 4.5  --   --   --   --   --   --   --   --   HGB 9.4*   < > 8.7*   < > 8.4*   < > 8.4* 8.3* 8.0*  HCT 30.7*   < > 27.5*   < > 26.6*   < > 26.6* 26.3* 25.2*  MCV 107.3*   < > 104.6*  --  105.1*  --  105.6* 104.8* 104.1*   PLT 259   < > 255   < > 221   < > 233 216 197   < > = values in this interval not displayed.   Blood Culture    Component Value Date/Time   SDES BLOOD LEFT ANTECUBITAL 05/31/2020 0751   SPECREQUEST  05/31/2020 0751    BOTTLES DRAWN AEROBIC AND ANAEROBIC Blood Culture adequate volume   CULT  05/31/2020 0751    NO GROWTH 5 DAYS Performed at [facility], [place] [address], [place], [place] [address]    REPTSTATUS 06/05/2020 FINAL 05/31/2020 0751    Iron Studies:  Recent Labs    06/05/20 0732  IRON 22*  TIBC 147*  FERRITIN 900*   Medications: . sodium chloride 10 mL/hr at 06/02/20 1007   . acetaminophen  650 mg Oral Q6H  .  brimonidine  1 drop Both Eyes BID  . chlorhexidine  15 mL Mouth/Throat Once  . Chlorhexidine Gluconate Cloth  6 each Topical Q0600  . cinacalcet  30 mg Oral Q supper  . docusate sodium  100 mg Oral BID  . dorzolamide-timolol  1 drop Both Eyes BID  . heparin  5,000 Units Subcutaneous Q8H  . latanoprost  1 drop Both Eyes QHS  . multivitamin  1 tablet Oral Q lunch  . Netarsudil Dimesylate  1 drop Both Eyes Daily  . senna-docusate  1 tablet Oral BID  . sevelamer carbonate  4,000 mg Oral TID WC  . sodium chloride flush  3 mL Intravenous Once    Dialysis Orders: [facility]. TTS 180NRe, BFR 400/DFR 800, EDW 60kg, 2K/2Ca, time 3.5hr, RUE AVF, heparin 1800 unit bolus mircera 145mcg IVP q 2 weeks- last dose 84mcg on 05/27/20 Calcitriol 1.75 mcg PO q HD Sensipar30mg  daily Binder: Renvela 5 tabs TID with meals  Assessment/Plan: 1. Osteomyelitis:Seen by ortho, s/p right 5th ray amputation on 6/18. Now awaiting SNF placement. 2. ESRD:TTS schedule. Please avoid gadolium contrast for all ESRD patients, Okay to use R hand (AVF arm) for blood draws only after all other options have been exhausted. We can draw labs on dialysis on HD days per pt request.Patient does need assistance removing tape from AVF after dialysis due  to vision impairment. Advised we can remove dressing while rounding in the morning. Next HD after today  6/24  3. Hypertension/volume:BP controlled. Not on BP meds. BP tends to run low during dialysis. Mild excess volume on exam today and 3kg above EDW. UFG 2-2.5L tomorrow as tolerated and will continue to titrate down volume. 4. Anemia:Hgb trended down to 8. ESA dose recently increased and due 6/26. Tsat 15% and ferritin 900. Has allergy to iron dextran (breathing problems, confirmed with pt) but has tolerated venofer as an outpatient. Does not appear he has ever had ferrelicit. Follow Hgb, if discharging soon will plan to order venofer with outpatient HD at discharge since we know this is well tolerated. If Hgb trends down further, may need to try test dose of ferrlecit here.  5. Metabolic bone disease:Corr calcium high but improving, hold calctriol and follow. Phos at goal. Continue sensipar and binder (renvela). 6. Nutrition:Renal diet with fluid restrictions 7. Dispo: SNF pending  Anice Paganini, PA-C 06/06/2020, 8:15 AM  [facility] Pager: [phone]  Patient seen and examined, agree with above note with above modifications. No c/o's-  Seen on HD-  Wants to have BM and work with PT today with family present even though discharge to SNF is planned.  No need for changes today-  Continue routine HD  Corliss Parish, MD 06/06/2020

## 2020-06-06 NOTE — TOC Progression Note (Signed)
Transition of Care [facility]) - Progression Note    Patient Details  Name: Timothy Ponce MRN: [id number] Date of Birth: [birth year]  Transition of Care [facility]) CM/SW [place], LCSW Phone Number: 06/06/2020, 4:57 PM  Clinical Narrative:    CSW spoke with patient, patient's mother, and patient's daughter's mother Thayer Headings). CSW answered questions about why CIR was not recommended and about SNF process. They will contact CSW back with a SNF choice.    Expected Discharge Plan: [place] Barriers to Discharge: Continued Medical Work up, SNF Pending bed offer  Expected Discharge Plan and Services Expected Discharge Plan: [place]   Discharge Planning Services: CM Consult Post Acute Care Choice: [place] Living arrangements for the past 2 months: Single Family Home                           HH Arranged: PT           Social Determinants of Health (SDOH) Interventions    Readmission Risk Interventions No flowsheet data found.

## 2020-06-06 NOTE — Procedures (Signed)
Patient was seen on dialysis and the procedure was supervised.  BFR 400  Via AVF BP is  86/45.   Patient appears to be tolerating treatment well  Timothy Ponce 06/06/2020

## 2020-06-06 NOTE — Progress Notes (Signed)
Occupational Therapy Treatment Patient Details Name: Timothy Ponce MRN: [id number] DOB: December 11, 1956 Today's Date: 06/06/2020    History of present illness Pt is a 64 yo male presenting with osteomyelitis of his fifth ray s/p amputation on 6/18. 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   OT comments  Patient just getting in bed with help of RN after transferring from [facility] on arrival.  He was agreeable to sit EOB and practice grooming/eating with built up foam handles.  Able to get to EOB with supervision.  Requiring min assist with both grooming and eating, patient stating he found the built up handles do not make much a difference for him.  He was having increased difficulty today due to worse vision (as he states he has not been allowed to do his glaucoma eye drops before 8am per his usual schedule).  Will continue to follow with OT acutely to address the deficits listed below.    Follow Up Recommendations  SNF;Supervision/Assistance - 24 hour    Equipment Recommendations  3 in 1 bedside commode;Tub/shower bench (tub bench if pt is agreeable to removing shower doors)    Recommendations for Other Services      Precautions / Restrictions Precautions Precautions: Fall Precaution Comments: wound VAC, glaucoma, legally blind Restrictions Weight Bearing Restrictions: Yes RLE Weight Bearing: Touchdown weight bearing       Mobility Bed Mobility Overal bed mobility: Needs Assistance Bed Mobility: Supine to Sit;Sit to Supine     Supine to sit: Supervision Sit to supine: Supervision      Transfers                 General transfer comment: Patient had just transfered from [facility] prior to arrival and refused attempt going to chair    Balance Overall balance assessment: Needs assistance Sitting-balance support: Feet supported Sitting balance-Leahy Scale: Good                                      ADL either performed or assessed with clinical judgement   ADL Overall ADL's : Needs assistance/impaired Eating/Feeding: Minimal assistance;Sitting   Grooming: Oral care;Sitting;Minimal assistance                                       Vision   Additional Comments: Patient stated vision is worse it has ever been since coming in hospitak   Perception     Praxis      Cognition Arousal/Alertness: Awake/alert Behavior During Therapy: WFL for tasks assessed/performed Overall Cognitive Status: No family/caregiver present to determine baseline cognitive functioning Area of Impairment: Following commands;Safety/judgement                       Following Commands: Follows multi-step commands inconsistently Safety/Judgement: Decreased awareness of deficits;Decreased awareness of safety     General Comments: Requires reminders of steps, asked for repetition of plan multiple times        Exercises     Shoulder Instructions       General Comments Patient stating vision is much worse since coming to hospital.  He has glaucoma and says he needs to do his eye drops by 8am and has not been able to do that  since coming in    Pertinent Vitals/ Pain       Pain Assessment: Faces Faces Pain Scale: Hurts a little bit Pain Location: R foot when sitting upright Pain Descriptors / Indicators: Sore;Throbbing Pain Intervention(s): Limited activity within patient's tolerance;Monitored during session;Repositioned  Home Living                                          Prior Functioning/Environment              Frequency  Min 2X/week        Progress Toward Goals  OT Goals(current goals can now be found in the care plan section)  Progress towards OT goals: Progressing toward goals  Acute Rehab OT Goals Patient Stated Goal: return home OT Goal Formulation: With patient Time For Goal Achievement: 06/17/20 Potential to  Achieve Goals: Good  Plan Discharge plan remains appropriate    Co-evaluation                 AM-PAC OT "6 Clicks" Daily Activity     Outcome Measure   Help from another person eating meals?: A Little Help from another person taking care of personal grooming?: A Little Help from another person toileting, which includes using toliet, bedpan, or urinal?: A Lot Help from another person bathing (including washing, rinsing, drying)?: A Lot Help from another person to put on and taking off regular upper body clothing?: A Little Help from another person to put on and taking off regular lower body clothing?: A Lot 6 Click Score: 15    End of Session Equipment Utilized During Treatment: Other (comment) (Built up handles)  OT Visit Diagnosis: Unsteadiness on feet (R26.81);Other abnormalities of gait and mobility (R26.89);Pain;Muscle weakness (generalized) (M62.81);Low vision, both eyes (H54.2) Pain - Right/Left: Right Pain - part of body: Ankle and joints of foot   Activity Tolerance Patient limited by fatigue   Patient Left in bed;with call bell/phone within reach   Nurse Communication Mobility status        Time: 0350-0938 OT Time Calculation (min): 29 min  Charges: OT General Charges $OT Visit: 1 Visit OT Treatments $Self Care/Home Management : 23-37 mins  August Luz, OTR/L    Phylliss Bob 06/06/2020, 2:08 PM

## 2020-06-06 NOTE — TOC Progression Note (Addendum)
Transition of Care [facility]) - Progression Note    Patient Details  Name: Timothy Ponce MRN: [id number] Date of Birth: October 25, 1956  Transition of Care [facility]) CM/SW [place], [place] Phone Number: 06/06/2020, 12:28 PM  Clinical Narrative:  1:41pm- CSW spoke with pt at bedside. Introduced self, role, reason for visit. Due to pt's visual impairments CSW verbally provided CMS ratings and offers. Pt aware, has some preferences but wants to wait until his family comes this afternoon to make a final decision. Dr. Darrick Meigs aware. This conversation was witnessed by Terri Piedra, [place] and renal navigator.   12:28pm- Will provide pt with SNF offers, awaiting confirmation offered SNFs can transport pt to [facility] dialysis.    Expected Discharge Plan: [place] Barriers to Discharge: Continued Medical Work up, SNF Pending bed offer  Expected Discharge Plan and Services Expected Discharge Plan: [place]   Discharge Planning Services: CM Consult Post Acute Care Choice: [place] Living arrangements for the past 2 months: Single Family Home HH Arranged: PT  Readmission Risk Interventions No flowsheet data found.

## 2020-06-07 MED ORDER — POLYETHYLENE GLYCOL 3350 17 G PO PACK: 17.0000 g | PACK | Freq: Every day | ORAL | Status: DC

## 2020-06-07 NOTE — Progress Notes (Signed)
Physical Therapy Treatment Patient Details Name: Timothy Ponce MRN: [id number] DOB: October 28, 1956 Today's Date: 06/07/2020    History of Present Illness Pt is a 64 yo male presenting with osteomyelitis of his fifth ray s/p amputation on 6/18. 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    PT Comments    Pt making slow, steady progress towards achieving his current functional mobility goals. Would continue to recommend pt d/c to SNF for further intensive therapy services prior to returning home with family support. Pt would continue to benefit from skilled physical therapy services at this time while admitted and after d/c to address the below listed limitations in order to improve overall safety and independence with functional mobility.   Follow Up Recommendations  SNF     Equipment Recommendations  None recommended by PT    Recommendations for Other Services       Precautions / Restrictions Precautions Precautions: Fall Precaution Comments: wound VAC, glaucoma, legally blind Restrictions Weight Bearing Restrictions: Yes RLE Weight Bearing: Touchdown weight bearing    Mobility  Bed Mobility Overal bed mobility: Needs Assistance Bed Mobility: Supine to Sit;Sit to Supine     Supine to sit: Supervision Sit to supine: Supervision   General bed mobility comments: Supervision for safety, use of rail.  Transfers Overall transfer level: Needs assistance Equipment used: Rolling walker (2 wheeled) Transfers: Sit to/from Stand Sit to Stand: Min guard         General transfer comment: good technique utilized, guarding for safety  Ambulation/Gait Ambulation/Gait assistance: Min assist;Min guard Gait Distance (Feet): 50 Feet Assistive device: Rolling walker (2 wheeled) Gait Pattern/deviations:  (hop-to on L LE) Gait velocity: decreased   General Gait Details: fatigues very quickly, able to  adhere to TDWB'ing R LE throughout independently; modest instability and requiring frequent min A for balance   Stairs             Wheelchair Mobility    Modified Rankin (Stroke Patients Only)       Balance Overall balance assessment: Needs assistance Sitting-balance support: Feet supported;No upper extremity supported Sitting balance-Leahy Scale: Good     Standing balance support: During functional activity Standing balance-Leahy Scale: Poor Standing balance comment: Requires UE support in standing.                            Cognition Arousal/Alertness: Awake/alert Behavior During Therapy: WFL for tasks assessed/performed Overall Cognitive Status: No family/caregiver present to determine baseline cognitive functioning Area of Impairment: Following commands;Safety/judgement                       Following Commands: Follows multi-step commands inconsistently Safety/Judgement: Decreased awareness of deficits;Decreased awareness of safety            Exercises General Exercises - Lower Extremity Ankle Circles/Pumps: AROM;Right;10 reps;Seated Long Arc Quad: AROM;Strengthening;Right;10 reps;Seated Hip Flexion/Marching: AROM;Strengthening;Both;10 reps;Seated    General Comments        Pertinent Vitals/Pain Pain Assessment: Faces Faces Pain Scale: Hurts a little bit Pain Location: R foot when sitting upright Pain Descriptors / Indicators: Sore;Throbbing Pain Intervention(s): Monitored during session;Repositioned    Home Living                      Prior Function            PT  Goals (current goals can now be found in the care plan section) Acute Rehab PT Goals PT Goal Formulation: With patient Time For Goal Achievement: 06/15/20 Potential to Achieve Goals: Good Progress towards PT goals: Progressing toward goals    Frequency    Min 3X/week      PT Plan Current plan remains appropriate    Co-evaluation               AM-PAC PT "6 Clicks" Mobility   Outcome Measure  Help needed turning from your back to your side while in a flat bed without using bedrails?: None Help needed moving from lying on your back to sitting on the side of a flat bed without using bedrails?: None Help needed moving to and from a bed to a chair (including a wheelchair)?: A Little Help needed standing up from a chair using your arms (e.g., wheelchair or bedside chair)?: A Little Help needed to walk in hospital room?: A Little Help needed climbing 3-5 steps with a railing? : Total 6 Click Score: 18    End of Session Equipment Utilized During Treatment: Gait belt Activity Tolerance: Patient limited by fatigue Patient left: in bed;with call bell/phone within reach;Other (comment) (MD team present) Nurse Communication: Mobility status PT Visit Diagnosis: Difficulty in walking, not elsewhere classified (R26.2) Pain - Right/Left: Right Pain - part of body: Ankle and joints of foot     Time: 1017-1040 PT Time Calculation (min) (ACUTE ONLY): 23 min  Charges:  $Gait Training: 8-22 mins $Therapeutic Activity: 8-22 mins                     Anastasio Champion, DPT  Acute Rehabilitation Services Pager [phone] Office [place] 06/07/2020, 12:28 PM

## 2020-06-07 NOTE — TOC Transition Note (Addendum)
Transition of Care [facility]) - CM/SW Discharge Note   Patient Details  Name: Timothy Ponce MRN: [id number] Date of Birth: 09-09-1956  Transition of Care [facility]) CM/SW Contact:  Alexander Mt, LCSW Phone Number: 06/07/2020, 1:26 PM   Clinical Narrative:    1:41pm- Pt mother will do paperwork for pt at 4pm, PTAR scheduled for 5pm. CSW requested [facility] let this writer know if unable to get needed paperwork.  1:26pm- Pt decision is for [facility] SNF. They have a bed for pt, CSW has alerted [facility] that another family member will need to complete paperwork due to pt visual impairment. All information sent to SNF, await confirmation of paperwork completion to send pt.    Final next level of care: Skilled Nursing Facility Barriers to Discharge: Barriers Resolved   Patient Goals and CMS Choice Patient states their goals for this hospitalization and ongoing recovery are:: get stronger, get home CMS Medicare.gov Compare Post Acute Care list provided to:: Patient (pt went over it with this writer verbally due to visual impairment and his family also review) Choice offered to / list presented to : Patient, [facility] POA / Guardian (mom and former partner)  Discharge Placement   Existing PASRR number confirmed : 06/05/20          Patient chooses bed at: [facility] Patient to be transferred to facility by: [place] Name of family member notified: pt mother Patient and family notified of of transfer: 06/07/20  Discharge Plan and Services Discharge Planning Services: CM Consult Post Acute Care Choice: [place]       [facility] Arranged: PT  Readmission Risk Interventions No flowsheet data found.

## 2020-06-07 NOTE — Progress Notes (Signed)
NAME:  Timothy Ponce, MRN:  [id number], DOB:  1956/10/24, LOS: 8 ADMISSION DATE:  05/30/2020  Subjective  No overnight events  Timothy Ponce was examined and evaluated at bedside this am. He was observed resting comfortably. He was able to ambulate with walker after walking with PT this am. He was able to have a bowel movement last night and this morning. He mentions some urge to defecate without any stool but thinks it may be due to flatulence. He also mentions balance difficulties over the last couple months and asks if it may be due to neuropathy.  Significant Hospital Events   6/15 admission 6/16 MRI showing osteomyelitis with abscess formation. Dr. Sharol Given planning for surgery on 6/18 6/17 HD UF 500cc 6/18 surgery--5th ray amputation 6/19 no significant events; PT recommending SNF  6/20 awaiting SNF placement 6/21 awaiting SNF placement 6/22 awaiting SNF placement pending family's decision  Objective   Blood pressure 122/62, pulse 68, temperature 98.2 F (36.8 C), temperature source Oral, resp. rate 18, height 5' 8.75" (1.746 m), weight 63.7 kg, SpO2 99 %.     Intake/Output Summary (Last 24 hours) at 06/07/2020 0526 Last data filed at 06/07/2020 0300 Gross per 24 hour  Intake 240 ml  Output 1639 ml  Net -1399 ml    Examination: General: in NAD Cardiac: extremities well perfused. RRR Pulm: breathing comfortably on room air. Lungs clear MSK: wound vac to RLE    Consults:  ortho nephrology Significant Diagnostic Tests:  R foot xray>>lucency in the 5th metatarsal head concerning for possible acute osteomyelitis.  R foot MRI>>area of superfiical ulceration with probable soft tissue abscess overlying the 5th metatarsal head measuring 1.7x0.6x1.5cm. findings suggestive of osteomyelitis involving the 5th metatarsal head.  Micro Data:  6/15 blood culture x2>>NG  Antimicrobials:  Vancomycin 6/15>>6/19  Summary  64 yo male with ESRD on HD (THS) who was admitted to [facility]  05/30/20 for 3w history of right foot pain. MRI revealed findings consistent with osteomyelitis of the 5th metatarsal head along with abscess formation. He underwent a 5th ray amputation on 6/18. PT has evaluated and is recommending SNF at this time which patient is amenable to. He is medically stable for discharge today to continue ongoing rehabilitation at a skilled facility.  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  Prn ibuprofen. --Bowel management: Senokot, colace.  --Continue PT/OT  ESRD on HD (THS). Management per nephrology. Monitor renal function and volume status.   Post operative anemia superimposed on ACD.   hgb slowly trending down as expected post op. Continue to monitor. Transfuse for hgb <7.  Best practice:  CODE STATUS: FULL Diet: renal DVT for prophylaxis: Subcutaneous heparin 5000 units every 8 hours  Dispo: discharge today   Timothy Hansen, MD INTERNAL MEDICINE RESIDENT PGY-1 Contact info:  Please page me at [phone] from 7am-5pm M-F.  Please use the [facility] after hours pager at [phone] after 5pm M-F and on weekends 06/07/20  5:26 AM  Labs    CBC Latest Ref Rng & Units 06/06/2020 06/05/2020 06/04/2020  WBC 4.0 -  10.5 K/uL 4.0 4.6 5.0  Hemoglobin 13.0 - 17.0 g/dL 8.2(L) 8.0(L) 8.3(L)  Hematocrit 39 - 52 % 25.9(L) 25.2(L) 26.3(L)  Platelets 150 - 400 K/uL 217 197 216   BMP Latest Ref Rng & Units 06/06/2020 06/05/2020 06/03/2020  Glucose 70 - 99 mg/dL 75 88 64(L)  BUN 8 - 23 mg/dL 31(H) 23 29(H)    Creatinine 0.61 - 1.24 mg/dL 12.20(H) 9.67(H) 10.20(H)  Sodium 135 - 145 mmol/L 135 137 135  Potassium 3.5 - 5.1 mmol/L 4.1 3.9 4.7  Chloride 98 - 111 mmol/L 98 101 96(L)  CO2 22 - 32 mmol/L 25 26 24   Calcium 8.9 - 10.3 mg/dL 8.8(L) 8.8(L) 8.9

## 2020-06-07 NOTE — Progress Notes (Signed)
Kathleen,RN from [place] called me back to get report on the patient. IV has been removed AVS in chart NT will get pt dressed and ready for PTAR pickup at 1745.

## 2020-06-07 NOTE — Progress Notes (Signed)
PTAR here to transport pt to SNF. All pts belongings taken with pt.

## 2020-06-07 NOTE — Progress Notes (Signed)
1:51pm-CSW received call from [place]. She asked CSW questions about needing more time to get paperwork done at [facility] as she had to go pick patient's mother up to do it. CSW answered general questions and referred her back to Unit CSW covering this patient today. She reported feeling rushed at the whole process. CSW made Unit CSW aware.   [place] LCSW

## 2020-06-07 NOTE — Progress Notes (Addendum)
[facility] Progress Note   Subjective:   Pt seen in room. Bandages removed from AVF, pinpoint sized area oozing blood but stopped after briefly holding pressure. Pt denies SOB, CP, dizziness, abdominal pain, nausea. Hands feel better today. Seems to be discharged to [facility] today   Objective Vitals:   06/06/20 1116 06/06/20 1744 06/07/20 0042 06/07/20 0500  BP: 116/61 116/67 104/60 122/62  Pulse: 71 66 65 68  Resp: 18 17 18 18   Temp:  98.4 F (36.9 C) 98.2 F (36.8 C) 98.2 F (36.8 C)  TempSrc:  Oral Oral Oral  SpO2:  99% 100% 99%  Weight: 63.7 kg     Height:       Physical Exam General:Well developed male in NAD Heart: RRR, no murmurs, rubs or gallops Lungs: CTA b/l without wheezing, rhonchi or rales Abdomen: Soft, non-tender, non-distended. +BS Extremities:No edema b/l lower extremities, wound vac R foot Dialysis Access: RUE AVF aneurysmal, +thrill  Additional Objective Labs: Basic Metabolic Panel: Recent Labs  Lab 06/03/20 0839 06/05/20 0732 06/06/20 0654  NA 135 137 135  K 4.7 3.9 4.1  CL 96* 101 98  CO2 24 26 25   GLUCOSE 64* 88 75  BUN 29* 23 31*  CREATININE 10.20* 9.67* 12.20*  CALCIUM 8.9 8.8* 8.8*  PHOS 4.6 4.3 4.5   Liver Function Tests: Recent Labs  Lab 06/03/20 0839 06/05/20 0732 06/06/20 0654  ALBUMIN 2.4* 2.2* 2.3*   CBC: Recent Labs  Lab 06/02/20 0537 06/02/20 0537 06/03/20 0839 06/03/20 0839 06/04/20 0212 06/05/20 0732 06/06/20 0730  WBC 5.3   < > 5.9   < > 5.0 4.6 4.0  HGB 8.4*   < > 8.4*   < > 8.3* 8.0* 8.2*  HCT 26.6*   < > 26.6*   < > 26.3* 25.2* 25.9*  MCV 105.1*  --  105.6*  --  104.8* 104.1* 104.9*  PLT 221   < > 233   < > 216 197 217   < > = values in this interval not displayed.   Blood Culture    Component Value Date/Time   SDES BLOOD LEFT ANTECUBITAL 05/31/2020 0751   SPECREQUEST  05/31/2020 0751    BOTTLES DRAWN AEROBIC AND ANAEROBIC Blood Culture adequate volume   CULT  05/31/2020 0751     NO GROWTH 5 DAYS Performed at [facility], [place] [address], [place], [place] [address]    REPTSTATUS 06/05/2020 FINAL 05/31/2020 0751    Iron Studies:  Recent Labs    06/05/20 0732  IRON 22*  TIBC 147*  FERRITIN 900*   Medications: . sodium chloride 10 mL/hr at 06/02/20 1007   . acetaminophen  650 mg Oral Q6H  . brimonidine  1 drop Both Eyes BID  . chlorhexidine  15 mL Mouth/Throat Once  . Chlorhexidine Gluconate Cloth  6 each Topical Q0600  . cinacalcet  30 mg Oral Q supper  . docusate sodium  100 mg Oral BID  . dorzolamide-timolol  1 drop Both Eyes BID  . heparin  5,000 Units Subcutaneous Q8H  . latanoprost  1 drop Both Eyes QHS  . multivitamin  1 tablet Oral Q lunch  . Netarsudil Dimesylate  1 drop Both Eyes Daily  . senna-docusate  1 tablet Oral BID  . sevelamer carbonate  4,000 mg Oral TID WC  . sodium chloride flush  3 mL Intravenous Once    Dialysis Orders: [facility]Re, BFR 400/DFR 800, EDW 60kg, 2K/2Ca, time  3.5hr, RUE AVF, heparin 1800 unit bolus mircera 129mcg IVP q 2 weeks- last dose 49mcg on 05/27/20 Calcitriol 1.75 mcg PO q HD Sensipar30mg  daily Binder: Renvela 5 tabs TID with meals  Assessment/Plan: 1. Osteomyelitis:Seen by ortho, s/p right 5th ray amputation on 6/18. Now awaiting SNF placement- says he is planning to go to [facility]. 2. ESRD:TTS schedule. Please avoid gadolium contrast for all ESRD patients, Okay to use R hand (AVF arm) for blood draws only after all other options have been exhausted. We can draw labs on dialysis on HD days per pt request.Patient does need assistance removing tape from AVF after dialysis due to vision impairment-removed today. Next HD 6/24 3. Hypertension/volume:BP controlled. Not on BP meds. BP tends to run low during dialysis.Tolerated 1.6L UF with HD on 6/21. Continue to titrate down volume as tolerated.  4. Anemia:Hgb trended down to 8, now stable at  8.2. ESA dose due 6/26.Tsat 15% and ferritin 900. Has allergy to iron dextran (breathing problems, confirmed with pt) but has tolerated venofer as an outpatient. Does not appear he has ever had ferrelicit/feraheme. Will order course of venofer at outpatient unit.  5. Metabolic bone disease:Corr calcium high but improving, hold calctriol and follow. Phosat goal. Continue sensipar and binder(renvela). 6. Nutrition:Renal diet with fluid restrictions 7. Dispo: SNF pending  Anice Paganini, PA-C 06/07/2020, 10:05 AM  [facility] Pager: [phone]  Patient seen and examined, agree with above note with above modifications. Seen in room- he is making preparations to be discharged to [facility]-  Will send orders to OP unit-  To get iron and increased ESA there  Corliss Parish, MD 06/07/2020

## 2020-06-07 NOTE — Social Work (Addendum)
Clinical Social Worker facilitated patient discharge including contacting patient family and facility to confirm patient discharge plans.  Clinical information faxed to facility and family agreeable with plan.  CSW arranged ambulance transport via [facility] to [place] RN to call [phone] with report prior to discharge.  Clinical Social Worker will sign off for now as social work intervention is no longer needed. Please consult [place] again if new need arises.  Aware of pt family questions, CSW assisted by [facility] Debbie with speaking with family and pt. Pt and family okay with dc. PTAR delayed for pt pick up at 5:45pm.   Westley Hummer, MSW, LCSW Clinical Social Worker

## 2020-06-07 NOTE — Progress Notes (Signed)
RN attempted to call [facility] to give report no answer will try again shortly.

## 2020-06-07 NOTE — Progress Notes (Signed)
Attempted to call again no answer phone rang the machine stated " the number you are trying to call is unavailable"

## 2020-06-09 ENCOUNTER — Ambulatory Visit: Payer: Medicare Other | Admitting: Podiatry

## 2020-06-12 ENCOUNTER — Encounter: Payer: Self-pay | Admitting: Orthopedic Surgery

## 2020-06-12 ENCOUNTER — Other Ambulatory Visit: Payer: Self-pay

## 2020-06-12 ENCOUNTER — Ambulatory Visit (INDEPENDENT_AMBULATORY_CARE_PROVIDER_SITE_OTHER): Payer: Medicare Other | Admitting: Physician Assistant

## 2020-06-12 VITALS — Ht 68.75 in | Wt 140.4 lb

## 2020-06-12 DIAGNOSIS — M869 Osteomyelitis, unspecified: Secondary | ICD-10-CM

## 2020-06-12 NOTE — Progress Notes (Signed)
Office Visit Note   Patient: Timothy Ponce           Date of Birth: [birth year]           MRN: [id number] Visit Date: 06/12/2020              Requested by: No referring provider defined for this encounter. PCP: Patient, No Pcp Per  Chief Complaint  Patient presents with  . Right Foot - Routine Post Op    06/02/20 Right foot 5th ray      HPI: This is a pleasant gentleman who is 10 days status post right foot fifth ray amputation.  He is currently at a nursing facility because he has very little help at home.  Overall he is feeling well  Assessment & Plan: Visit Diagnoses: No diagnosis found.  Plan: Patient will follow up in 1 week he may wash the foot with mild soap and water.  Continue nonweightbearing.  Hopefully when sutures are removed we can give him a prescription for an orthotic next week  Follow-Up Instructions: No follow-ups on file.   Ortho Exam  Patient is alert, oriented, no adenopathy, well-dressed, normal affect, normal respiratory effort.  Focused examination of his foot demonstrates no soft tissue swelling well opposed wound edges without any wound dehiscence no cellulitis no foul odor no drainage  Imaging: No results found. No images are attached to the encounter.  Labs: Lab Results  Component Value Date   HGBA1C 4.9 05/31/2020   ESRSEDRATE 65 (H) 05/31/2020   CRP 2.1 (H) 05/31/2020   REPTSTATUS 06/05/2020 FINAL 05/31/2020   CULT  05/31/2020    NO GROWTH 5 DAYS Performed at [facility], [place] [address], [place], [place] [address]      Lab Results  Component Value Date   ALBUMIN 2.3 (L) 06/06/2020   ALBUMIN 2.2 (L) 06/05/2020   ALBUMIN 2.4 (L) 06/03/2020    No results found for: MG No results found for: VD25OH  No results found for: PREALBUMIN CBC EXTENDED Latest Ref Rng & Units 06/06/2020 06/05/2020 06/04/2020  WBC 4.0 - 10.5 K/uL 4.0 4.6 5.0  RBC 4.22 - 5.81 MIL/uL 2.47(L) 2.42(L) 2.51(L)  HGB 13.0 - 17.0 g/dL 8.2(L) 8.0(L)  8.3(L)  HCT 39 - 52 % 25.9(L) 25.2(L) 26.3(L)  PLT 150 - 400 K/uL 217 197 216  NEUTROABS 1.7 - 7.7 K/uL - - -  LYMPHSABS 0.7 - 4.0 K/uL - - -     Body mass index is 20.89 kg/m.  Orders:  No orders of the defined types were placed in this encounter.  No orders of the defined types were placed in this encounter.    Procedures: No procedures performed  Clinical Data: No additional findings.  ROS:  All other systems negative, except as noted in the HPI. Review of Systems  Objective: Vital Signs: Ht 5' 8.75" (1.746 m)   Wt 140 lb 6.9 oz (63.7 kg)   BMI 20.89 kg/m   Specialty Comments:  No specialty comments available.  PMFS History: Patient Active Problem List   Diagnosis Date Noted  . ESRD on hemodialysis ([place]) 05/31/2020  . Anemia of chronic renal failure 05/31/2020  . Peripheral arterial disease ([place]) 05/31/2020  . Osteomyelitis of fifth toe of right foot ([place]) 05/30/2020   Past Medical History:  Diagnosis Date  . Chronic kidney disease   . Glaucoma     No family history on file.  Past Surgical History:  Procedure Laterality Date  . AMPUTATION  Right 06/02/2020   Procedure: RIGHT FOOT 5TH RAY AMPUTATION;  Surgeon: Newt Minion, MD;  Location: [facility];  Service: Orthopedics;  Laterality: Right;  . AV FISTULA PLACEMENT     Social History   Occupational History  . Not on file  Tobacco Use  . Smoking status: Never Smoker  . Smokeless tobacco: Never Used  Vaping Use  . Vaping Use: Never used  Substance and Sexual Activity  . Alcohol use: Not Currently  . Drug use: Never  . Sexual activity: Not on file

## 2020-06-21 ENCOUNTER — Ambulatory Visit (INDEPENDENT_AMBULATORY_CARE_PROVIDER_SITE_OTHER): Payer: Medicare Other | Admitting: Physician Assistant

## 2020-06-21 ENCOUNTER — Encounter: Payer: Self-pay | Admitting: Physician Assistant

## 2020-06-21 ENCOUNTER — Other Ambulatory Visit: Payer: Self-pay

## 2020-06-21 VITALS — Ht 68.75 in | Wt 140.4 lb

## 2020-06-21 DIAGNOSIS — M869 Osteomyelitis, unspecified: Secondary | ICD-10-CM

## 2020-06-21 NOTE — Progress Notes (Signed)
Office Visit Note   Patient: Timothy Ponce           Date of Birth: 1956-06-29           MRN: [id number] Visit Date: 06/21/2020              Requested by: No referring provider defined for this encounter. PCP: Patient, No Pcp Per  Chief Complaint  Patient presents with  . Right Foot - Routine Post Op    06/02/20 Right foot 5th ray      HPI: Patient presents today almost 3 weeks status post right foot fifth ray amputation.  He is in nursing facility as he lives at home with his elderly mother he has no complaints  Assessment & Plan: Visit Diagnoses: No diagnosis found.  Plan: Sutures were harvested today.  A prescription for a extra-depth shoe and carbon plate was provided to him he may begin weightbearing as tolerated in about 4 days follow-up in 2 weeks  Follow-Up Instructions: No follow-ups on file.   Ortho Exam  Patient is alert, oriented, no adenopathy, well-dressed, normal affect, normal respiratory effort. Focused examination of his right foot demonstrates well-healed surgical incision.  Minimal soft tissue swelling no cellulitis compartments soft compressible no drainage  Imaging: No results found. No images are attached to the encounter.  Labs: Lab Results  Component Value Date   HGBA1C 4.9 05/31/2020   ESRSEDRATE 65 (H) 05/31/2020   CRP 2.1 (H) 05/31/2020   REPTSTATUS 06/05/2020 FINAL 05/31/2020   CULT  05/31/2020    NO GROWTH 5 DAYS Performed at [facility], [place] [address], [place], [place] [address]      Lab Results  Component Value Date   ALBUMIN 2.3 (L) 06/06/2020   ALBUMIN 2.2 (L) 06/05/2020   ALBUMIN 2.4 (L) 06/03/2020    No results found for: MG No results found for: VD25OH  No results found for: PREALBUMIN CBC EXTENDED Latest Ref Rng & Units 06/06/2020 06/05/2020 06/04/2020  WBC 4.0 - 10.5 K/uL 4.0 4.6 5.0  RBC 4.22 - 5.81 MIL/uL 2.47(L) 2.42(L) 2.51(L)  HGB 13.0 - 17.0 g/dL 8.2(L) 8.0(L) 8.3(L)  HCT 39 - 52 % 25.9(L) 25.2(L)  26.3(L)  PLT 150 - 400 K/uL 217 197 216  NEUTROABS 1.7 - 7.7 K/uL - - -  LYMPHSABS 0.7 - 4.0 K/uL - - -     Body mass index is 20.89 kg/m.  Orders:  No orders of the defined types were placed in this encounter.  No orders of the defined types were placed in this encounter.    Procedures: No procedures performed  Clinical Data: No additional findings.  ROS:  All other systems negative, except as noted in the HPI. Review of Systems  Objective: Vital Signs: Ht 5' 8.75" (1.746 m)   Wt 140 lb 6.9 oz (63.7 kg)   BMI 20.89 kg/m   Specialty Comments:  No specialty comments available.  PMFS History: Patient Active Problem List   Diagnosis Date Noted  . ESRD on hemodialysis ([place]) 05/31/2020  . Anemia of chronic renal failure 05/31/2020  . Peripheral arterial disease ([place]) 05/31/2020  . Osteomyelitis of fifth toe of right foot ([place]) 05/30/2020   Past Medical History:  Diagnosis Date  . Chronic kidney disease   . Glaucoma     No family history on file.  Past Surgical History:  Procedure Laterality Date  . AMPUTATION Right 06/02/2020   Procedure: RIGHT FOOT 5TH RAY AMPUTATION;  Surgeon: Newt Minion,  MD;  Location: [facility];  Service: Orthopedics;  Laterality: Right;  . AV FISTULA PLACEMENT     Social History   Occupational History  . Not on file  Tobacco Use  . Smoking status: Never Smoker  . Smokeless tobacco: Never Used  Vaping Use  . Vaping Use: Never used  Substance and Sexual Activity  . Alcohol use: Not Currently  . Drug use: Never  . Sexual activity: Not on file

## 2020-06-24 DIAGNOSIS — Z4802 Encounter for removal of sutures: Secondary | ICD-10-CM | POA: Insufficient documentation

## 2020-06-28 ENCOUNTER — Telehealth: Payer: Self-pay | Admitting: Orthopedic Surgery

## 2020-06-28 NOTE — Telephone Encounter (Signed)
Patient was called but could not leave voice message; phone just kept ringing. I will hold message and try again later. Patient is able to remove his ace wrap and wash area with soap and water, let it dry and re-apply guaze and ace wrap daily. If patient has concerns please contact our office.

## 2020-06-28 NOTE — Telephone Encounter (Signed)
Patient called.   Repeat message. Read him the message from star. Understood

## 2020-06-28 NOTE — Telephone Encounter (Signed)
Pt would like to know if he's able to remove ace bandage from his foot; pt had a toe amputated 06/03/20.   [phone]

## 2020-06-30 ENCOUNTER — Telehealth: Payer: Self-pay | Admitting: Orthopedic Surgery

## 2020-06-30 NOTE — Telephone Encounter (Signed)
Timothy Ponce with [facility] would like to put in a order for PT with the frequency of once a week for 3 weeks.   Timothy Ponce CB# [phone]

## 2020-06-30 NOTE — Telephone Encounter (Signed)
IC verbal given.  

## 2020-07-03 ENCOUNTER — Telehealth: Payer: Self-pay | Admitting: Orthopedic Surgery

## 2020-07-03 NOTE — Telephone Encounter (Signed)
Cindee Salt from [facility] called. Requesting PT orders 2x wk 3 wks 1x 2wks. His call back number is [phone]

## 2020-07-04 NOTE — Telephone Encounter (Signed)
Pt is s/p a 5th ray amputation. Called and gave verbal ok for orders below. To call with questions.

## 2020-07-05 ENCOUNTER — Encounter: Payer: Self-pay | Admitting: Family

## 2020-07-05 ENCOUNTER — Ambulatory Visit (INDEPENDENT_AMBULATORY_CARE_PROVIDER_SITE_OTHER): Payer: Medicare Other | Admitting: Family

## 2020-07-05 ENCOUNTER — Other Ambulatory Visit: Payer: Self-pay

## 2020-07-05 VITALS — Ht 68.0 in | Wt 140.0 lb

## 2020-07-05 DIAGNOSIS — Z89431 Acquired absence of right foot: Secondary | ICD-10-CM

## 2020-07-05 DIAGNOSIS — M869 Osteomyelitis, unspecified: Secondary | ICD-10-CM

## 2020-07-05 NOTE — Progress Notes (Signed)
   Post-Op Visit Note   Patient: Timothy Ponce           Date of Birth: [birth year]           MRN: [id number] Visit Date: 07/05/2020 PCP: Patient, No Pcp Per  Chief Complaint:  Chief Complaint  Patient presents with  . Right Foot - Routine Post Op    06/02/20 right foot 5th ray amputation     HPI:  HPI Patient is a 64 year old gentleman seen today status post right foot fifth ray amputation June 18 of this year he has been nonweightbearing in a wheelchair using a postop shoe.  He is pleased with the healing of his incision.  He wonders if he should proceed with getting an extra-depth shoe from [employer]. Ortho Exam Incision is well-healed there is no erythema no sign of skin breakdown foot has minimal swelling  Visit Diagnoses:  1. Osteomyelitis of fifth toe of right foot ([place])   2. Partial nontraumatic amputation of foot, right [facility])     Plan: May advance his weightbearing as tolerated in regular shoewear.  Follow-Up Instructions: Return if symptoms worsen or fail to improve.   Imaging: No results found.  Orders:  No orders of the defined types were placed in this encounter.  No orders of the defined types were placed in this encounter.    PMFS History: Patient Active Problem List   Diagnosis Date Noted  . ESRD on hemodialysis ([place]) 05/31/2020  . Anemia of chronic renal failure 05/31/2020  . Peripheral arterial disease ([place]) 05/31/2020  . Osteomyelitis of fifth toe of right foot ([place]) 05/30/2020   Past Medical History:  Diagnosis Date  . Chronic kidney disease   . Glaucoma     History reviewed. No pertinent family history.  Past Surgical History:  Procedure Laterality Date  . AMPUTATION Right 06/02/2020   Procedure: RIGHT FOOT 5TH RAY AMPUTATION;  Surgeon: Newt Minion, MD;  Location: [place];  Service: Orthopedics;  Laterality: Right;  . AV FISTULA PLACEMENT     Social History   Occupational History  . Not on file  Tobacco Use  . Smoking status: Never  Smoker  . Smokeless tobacco: Never Used  Vaping Use  . Vaping Use: Never used  Substance and Sexual Activity  . Alcohol use: Not Currently  . Drug use: Never  . Sexual activity: Not on file

## 2020-12-01 ENCOUNTER — Other Ambulatory Visit: Payer: Self-pay

## 2020-12-01 ENCOUNTER — Ambulatory Visit (INDEPENDENT_AMBULATORY_CARE_PROVIDER_SITE_OTHER): Payer: Medicare Other | Admitting: Physician Assistant

## 2020-12-01 ENCOUNTER — Telehealth: Payer: Self-pay

## 2020-12-01 ENCOUNTER — Other Ambulatory Visit (HOSPITAL_COMMUNITY)
Admission: RE | Admit: 2020-12-01 | Discharge: 2020-12-01 | Disposition: A | Discharge status: Home / Self Care | Payer: Medicare Other | Source: Ambulatory Visit | Attending: Vascular Surgery | Admitting: Vascular Surgery

## 2020-12-01 VITALS — BP 125/69 | HR 67 | Temp 98.6°F | Resp 20 | Ht 68.0 in | Wt 132.4 lb

## 2020-12-01 DIAGNOSIS — Z01812 Encounter for preprocedural laboratory examination: Secondary | ICD-10-CM | POA: Insufficient documentation

## 2020-12-01 DIAGNOSIS — N186 End stage renal disease: Secondary | ICD-10-CM | POA: Diagnosis not present

## 2020-12-01 DIAGNOSIS — Z20822 Contact with and (suspected) exposure to covid-19: Secondary | ICD-10-CM | POA: Insufficient documentation

## 2020-12-01 NOTE — H&P (View-Only) (Signed)
HISTORY AND PHYSICAL     CC:  dialysis access Requesting Provider:  Mauricia Area, MD  HPI: This is a 64 y.o. male here for evaluation of his hemodialysis access.  He has a right arm AVF.  He was seen by Dr. Donzetta Matters earlier this year for two large psa on the fistula. There was some thin, discolored skin but no ulceration.  He felt pt could always have conversion to an upper arm fistula with ligation of the forearm and possible excision of the psa.  Otherwise, he did not recommend any intervetnion given he was not having any complications and could likely lead to thrombosis of the fistula in the forearm.  He was instructed to call if he had any further issues.  He returns today with worrisome area on the aneurysmal area.  He states that the techs are not supposed to be sticking him on the pink part of the skin.  He states they rotate on the outside and the inside of the fistula.  Given he is visually impaired, he cannot see exactly where they stick.  He states this has not bled.  He has had this fistula since 2005 or 2006.    The pt is right hand dominant.    Pt is on dialysis.   Days of dialysis if applicable:  T/T/S      The pt is not on a statin for cholesterol management.  The pt is not on a daily aspirin.  Other AC:  none The pt is not on medications for hypertension.  The pt is not diabetic.   Tobacco hx:  never  Past Medical History:  Diagnosis Date  . Chronic kidney disease   . Glaucoma     Past Surgical History:  Procedure Laterality Date  . 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 AV FISTULA PLACEMENT      Allergies  Allergen Reactions  . Darvon [Propoxyphene] Other (See Comments)    Lungs filling up with fluid  . Iron Dextran     Other reaction(s): Breathing Problems  . Lac Bovis     Other reaction(s): Unknown  . Lactose Intolerance (Gi)      unknown    Current Outpatient Medications  Medication Sig Dispense Refill  . acetaminophen (TYLENOL) 650 MG CR tablet Take 650 mg by mouth every 8 (eight) hours as needed for pain (At the time of dialysis).    . ALPHAGAN P 0.1 % SOLN Place 1 drop into both eyes in the morning and at bedtime.     . dorzolamide-timolol (COSOPT) 22.3-6.8 MG/ML ophthalmic solution Place 1 drop into both eyes 2 (two) times daily.     Marland Kitchen ibuprofen (ADVIL) 200 MG tablet Take 600 mg by mouth every 6 (six) hours as needed for moderate pain.    Marland Kitchen LUMIGAN 0.01 % SOLN Place 1 drop into both eyes at bedtime.     . Methoxy PEG-Epoetin Beta (MIRCERA IJ) Mircera    . multivitamin (RENA-VIT) TABS tablet Take 1 tablet by mouth daily.     . RHOPRESSA 0.02 % SOLN Place 1 drop into both eyes daily.     . sevelamer carbonate (RENVELA) 800 MG tablet Take 1,600-4,000 mg by mouth See admin instructions. Takes 5 tablets with each meal and 2 tablets snacks.     No current facility-administered medications for this visit.  Social History   Socioeconomic History  . Marital status: Single    Spouse name: Not on file  . Number of children: Not on file  . Years of education: Not on file  . Highest education level: Not on file  Occupational History  . Not on file  Tobacco Use  . Smoking status: Never Smoker  . Smokeless tobacco: Never Used  Vaping Use  . Vaping Use: Never used  Substance and Sexual Activity  . Alcohol use: Not Currently  . Drug use: Never  . Sexual activity: Not on file  Other Topics Concern  . Not on file  Social History Narrative  . Not on file   Social Determinants of Health   Financial Resource Strain: Not on file  Food Insecurity: Not on file  Transportation Needs: Not on file  Physical Activity: Not on file  Stress: Not on file  Social Connections: Not on file  Intimate Partner Violence: Not on file     ROS: [x]  Positive   [ ]  Negative   [ ]  All sytems reviewed and are  negative  Cardiac: []  chest pain/pressure []  SOB []  DOE  Vascular: []  pain in legs while walking []  pain in feet when lying flat []  hx of DVT []  swelling in legs  Pulmonary: []  asthma []  wheezing  Neurologic: []  hx CVA/TIA  Hematologic: [x]  anemia of chronic disease  GI []  GERD  GU: [x]  CKD/renal failure  [x]  HD--- [x]  T/T/S  Psychiatric: []  hx of major depression  Integumentary: []  rashes []  ulcers  Constitutional: []  fever []  chills  PHYSICAL EXAMINATION:  Today's Vitals   12/01/20 0850  BP: 125/69  Pulse: 67  Resp: 20  Temp: 98.6 F (37 C)  TempSrc: Temporal  SpO2: 99%  Weight: 132 lb 6.4 oz (60.1 kg)  Height: 5\' 8"  (1.727 m)   Body mass index is 20.13 kg/m.    General:  WDWN male in NAD Gait: walks with cane HENT: WNL Pulmonary: normal non-labored breathing Cardiac: regular Abdomen: soft, NT, no masses Skin: without rashes, without ulcers  Vascular Exam/Pulses:   Right Left  Radial 2+ (normal) 2+ (normal)   Extremities:  Fistula with thrill; palpable right radial pulse is present.  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    Musculoskeletal: no muscle wasting or atrophy  Neurologic: A&O X 3; Speech is fluent/normal    ASSESSMENT/PLAN: 64 y.o. male with ESRD here for evaluation of his hemodialysis access.  He has a right RC AVF that was created around 2005/2006.  He was seen by Dr. Donzetta Matters in March 2021 and at that time, he did not have a scab or issues.  He has since developed this scab and it has not bled but is worrisome.  -pt seen and examined with Dr. Donzetta Matters this morning.  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  Dr. Donzetta Matters discussed with pt that we can get this done on Monday.  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  Given this, Dr. Donzetta Matters discussed with pt  that we can schedule him for Monday afternoon and he is in agreement with this.  Pt understands it will be one of Dr. Claretha Cooper partners to perform surgery as he is not here next week.  Pt expresses understanding.   -also discussed with pt how  to hold pressure should he have any bleeding over the weekend and to call 911.     Leontine Locket, [facility] [phone]  Clinic MD:   Pt seen and examined with Dr. Donzetta Matters

## 2020-12-01 NOTE — Progress Notes (Signed)
ERROR

## 2020-12-01 NOTE — Progress Notes (Signed)
HISTORY AND PHYSICAL     CC:  dialysis access Requesting Provider:  Mauricia Area, MD  HPI: This is a 64 y.o. male here for evaluation of his hemodialysis access.  He has a right arm AVF.  He was seen by Dr. Donzetta Matters earlier this year for two large psa on the fistula. There was some thin, discolored skin but no ulceration.  He felt pt could always have conversion to an upper arm fistula with ligation of the forearm and possible excision of the psa.  Otherwise, he did not recommend any intervetnion given he was not having any complications and could likely lead to thrombosis of the fistula in the forearm.  He was instructed to call if he had any further issues.  He returns today with worrisome area on the aneurysmal area.  He states that the techs are not supposed to be sticking him on the pink part of the skin.  He states they rotate on the outside and the inside of the fistula.  Given he is visually impaired, he cannot see exactly where they stick.  He states this has not bled.  He has had this fistula since 2005 or 2006.    The pt is right hand dominant.    Pt is on dialysis.   Days of dialysis if applicable:  T/T/S      The pt is not on a statin for cholesterol management.  The pt is not on a daily aspirin.  Other AC:  none The pt is not on medications for hypertension.  The pt is not diabetic.   Tobacco hx:  never  Past Medical History:  Diagnosis Date  . Chronic kidney disease   . Glaucoma     Past Surgical History:  Procedure Laterality Date  . 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 AV FISTULA PLACEMENT      Allergies  Allergen Reactions  . Darvon [Propoxyphene] Other (See Comments)    Lungs filling up with fluid  . Iron Dextran     Other reaction(s): Breathing Problems  . Lac Bovis     Other reaction(s): Unknown  . Lactose Intolerance (Gi)      unknown    Current Outpatient Medications  Medication Sig Dispense Refill  . acetaminophen (TYLENOL) 650 MG CR tablet Take 650 mg by mouth every 8 (eight) hours as needed for pain (At the time of dialysis).    . ALPHAGAN P 0.1 % SOLN Place 1 drop into both eyes in the morning and at bedtime.     . dorzolamide-timolol (COSOPT) 22.3-6.8 MG/ML ophthalmic solution Place 1 drop into both eyes 2 (two) times daily.     Marland Kitchen ibuprofen (ADVIL) 200 MG tablet Take 600 mg by mouth every 6 (six) hours as needed for moderate pain.    Marland Kitchen LUMIGAN 0.01 % SOLN Place 1 drop into both eyes at bedtime.     . Methoxy PEG-Epoetin Beta (MIRCERA IJ) Mircera    . multivitamin (RENA-VIT) TABS tablet Take 1 tablet by mouth daily.     . RHOPRESSA 0.02 % SOLN Place 1 drop into both eyes daily.     . sevelamer carbonate (RENVELA) 800 MG tablet Take 1,600-4,000 mg by mouth See admin instructions. Takes 5 tablets with each meal and 2 tablets snacks.     No current facility-administered medications for this visit.  Social History   Socioeconomic History  . Marital status: Single    Spouse name: Not on file  . Number of children: Not on file  . Years of education: Not on file  . Highest education level: Not on file  Occupational History  . Not on file  Tobacco Use  . Smoking status: Never Smoker  . Smokeless tobacco: Never Used  Vaping Use  . Vaping Use: Never used  Substance and Sexual Activity  . Alcohol use: Not Currently  . Drug use: Never  . Sexual activity: Not on file  Other Topics Concern  . Not on file  Social History Narrative  . Not on file   Social Determinants of Health   Financial Resource Strain: Not on file  Food Insecurity: Not on file  Transportation Needs: Not on file  Physical Activity: Not on file  Stress: Not on file  Social Connections: Not on file  Intimate Partner Violence: Not on file     ROS: [x]  Positive   [ ]  Negative   [ ]  All sytems reviewed and are  negative  Cardiac: []  chest pain/pressure []  SOB []  DOE  Vascular: []  pain in legs while walking []  pain in feet when lying flat []  hx of DVT []  swelling in legs  Pulmonary: []  asthma []  wheezing  Neurologic: []  hx CVA/TIA  Hematologic: [x]  anemia of chronic disease  GI []  GERD  GU: [x]  CKD/renal failure  [x]  HD--- [x]  T/T/S  Psychiatric: []  hx of major depression  Integumentary: []  rashes []  ulcers  Constitutional: []  fever []  chills  PHYSICAL EXAMINATION:  Today's Vitals   12/01/20 0850  BP: 125/69  Pulse: 67  Resp: 20  Temp: 98.6 F (37 C)  TempSrc: Temporal  SpO2: 99%  Weight: 132 lb 6.4 oz (60.1 kg)  Height: 5\' 8"  (1.727 m)   Body mass index is 20.13 kg/m.    General:  WDWN male in NAD Gait: walks with cane HENT: WNL Pulmonary: normal non-labored breathing Cardiac: regular Abdomen: soft, NT, no masses Skin: without rashes, without ulcers  Vascular Exam/Pulses:   Right Left  Radial 2+ (normal) 2+ (normal)   Extremities:  Fistula with thrill; palpable right radial pulse is present.  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    Musculoskeletal: no muscle wasting or atrophy  Neurologic: A&O X 3; Speech is fluent/normal    ASSESSMENT/PLAN: 64 y.o. male with ESRD here for evaluation of his hemodialysis access.  He has a right RC AVF that was created around 2005/2006.  He was seen by Dr. Donzetta Matters in March 2021 and at that time, he did not have a scab or issues.  He has since developed this scab and it has not bled but is worrisome.  -pt seen and examined with Dr. Donzetta Matters this morning.  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  Dr. Donzetta Matters discussed with pt that we can get this done on Monday.  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  Given this, Dr. Donzetta Matters discussed with pt  that we can schedule him for Monday afternoon and he is in agreement with this.  Pt understands it will be one of Dr. Claretha Cooper partners to perform surgery as he is not here next week.  Pt expresses understanding.   -also discussed with pt how  to hold pressure should he have any bleeding over the weekend and to call 911.     Leontine Locket, [facility] [phone]  Clinic MD:   Pt seen and examined with Dr. Donzetta Matters

## 2020-12-01 NOTE — Telephone Encounter (Signed)
Surgery Wednesday per MD- patient aware

## 2020-12-02 LAB — SARS CORONAVIRUS 2 (TAT 6-24 HRS): SARS Coronavirus 2: NEGATIVE

## 2020-12-05 ENCOUNTER — Other Ambulatory Visit: Payer: Self-pay

## 2020-12-05 ENCOUNTER — Encounter (HOSPITAL_COMMUNITY): Payer: Self-pay | Admitting: Vascular Surgery

## 2020-12-05 NOTE — Anesthesia Preprocedure Evaluation (Addendum)
Anesthesia Evaluation  Patient identified by MRN, date of birth, ID band Patient awake    Reviewed: Allergy & Precautions, NPO status , Patient's Chart, lab work & pertinent test results  History of Anesthesia Complications (+) AWARENESS UNDER ANESTHESIA and history of anesthetic complications  Airway Mallampati: II  TM Distance: >3 FB Neck ROM: Full    Dental no notable dental hx. (+) Teeth Intact   Pulmonary shortness of breath,    Pulmonary exam normal breath sounds clear to auscultation       Cardiovascular + Peripheral Vascular Disease  Normal cardiovascular exam Rhythm:Regular Rate:Normal     Neuro/Psych  Headaches, Glaucoma Macular corneal dystrophy negative psych ROS   GI/Hepatic negative GI ROS, Neg liver ROS,   Endo/Other  Hypercalcemia Secondary hyperparathyroidism  Renal/GU ESRF and DialysisRenal diseaseDialysis TThSat  negative genitourinary   Musculoskeletal negative musculoskeletal ROS (+)   Abdominal   Peds  Hematology  (+) anemia ,   Anesthesia Other Findings   Reproductive/Obstetrics                           Anesthesia Physical Anesthesia Plan  ASA: IV  Anesthesia Plan: MAC   Post-op Pain Management:    Induction: Intravenous  PONV Risk Score and Plan: 2 and Propofol infusion, Ondansetron, Treatment may vary due to age or medical condition and Midazolam  Airway Management Planned: Natural Airway and Simple Face Mask  Additional Equipment:   Intra-op Plan:   Post-operative Plan:   Informed Consent: I have reviewed the patients History and Physical, chart, labs and discussed the procedure including the risks, benefits and alternatives for the proposed anesthesia with the patient or authorized representative who has indicated his/her understanding and acceptance.     Dental advisory given  Plan Discussed with: CRNA and Anesthesiologist  Anesthesia Plan  Comments:        Anesthesia Quick Evaluation

## 2020-12-06 ENCOUNTER — Ambulatory Visit (HOSPITAL_COMMUNITY): Payer: Medicare Other | Admitting: Certified Registered Nurse Anesthetist

## 2020-12-06 ENCOUNTER — Encounter (HOSPITAL_COMMUNITY): Payer: Self-pay | Admitting: Vascular Surgery

## 2020-12-06 ENCOUNTER — Encounter (HOSPITAL_COMMUNITY)
Admission: RE | Disposition: A | Discharge status: Home / Self Care | Payer: Self-pay | Source: Home / Self Care | Attending: Vascular Surgery

## 2020-12-06 ENCOUNTER — Ambulatory Visit (HOSPITAL_COMMUNITY)
Admission: RE | Admit: 2020-12-06 | Discharge: 2020-12-06 | Disposition: A | Discharge status: Home / Self Care | Payer: Medicare Other | Attending: Vascular Surgery | Admitting: Vascular Surgery

## 2020-12-06 DIAGNOSIS — N189 Chronic kidney disease, unspecified: Secondary | ICD-10-CM | POA: Diagnosis not present

## 2020-12-06 DIAGNOSIS — Z79899 Other long term (current) drug therapy: Secondary | ICD-10-CM | POA: Insufficient documentation

## 2020-12-06 DIAGNOSIS — Z20822 Contact with and (suspected) exposure to covid-19: Secondary | ICD-10-CM | POA: Insufficient documentation

## 2020-12-06 DIAGNOSIS — T82898A Other specified complication of vascular prosthetic devices, implants and grafts, initial encounter: Secondary | ICD-10-CM | POA: Insufficient documentation

## 2020-12-06 DIAGNOSIS — H409 Unspecified glaucoma: Secondary | ICD-10-CM | POA: Diagnosis not present

## 2020-12-06 DIAGNOSIS — Z888 Allergy status to other drugs, medicaments and biological substances status: Secondary | ICD-10-CM | POA: Diagnosis not present

## 2020-12-06 DIAGNOSIS — I868 Varicose veins of other specified sites: Secondary | ICD-10-CM | POA: Diagnosis not present

## 2020-12-06 DIAGNOSIS — X58XXXA Exposure to other specified factors, initial encounter: Secondary | ICD-10-CM | POA: Insufficient documentation

## 2020-12-06 DIAGNOSIS — E739 Lactose intolerance, unspecified: Secondary | ICD-10-CM | POA: Diagnosis not present

## 2020-12-06 DIAGNOSIS — Z89421 Acquired absence of other right toe(s): Secondary | ICD-10-CM | POA: Insufficient documentation

## 2020-12-06 DIAGNOSIS — Z885 Allergy status to narcotic agent status: Secondary | ICD-10-CM | POA: Insufficient documentation

## 2020-12-06 HISTORY — DX: Other complications of anesthesia, initial encounter: T88.59XA

## 2020-12-06 HISTORY — DX: Unspecified cataract: H26.9

## 2020-12-06 HISTORY — DX: Polyneuropathy, unspecified: G62.9

## 2020-12-06 HISTORY — DX: Dyspnea, unspecified: R06.00

## 2020-12-06 HISTORY — PX: REVISON OF ARTERIOVENOUS FISTULA: SHX6074

## 2020-12-06 HISTORY — DX: End stage renal disease: N18.6

## 2020-12-06 LAB — POCT I-STAT, CHEM 8
BUN: 24 mg/dL — ABNORMAL HIGH (ref 8–23)
Calcium, Ion: 1.1 mmol/L — ABNORMAL LOW (ref 1.15–1.40)
Chloride: 93 mmol/L — ABNORMAL LOW (ref 98–111)
Creatinine, Ser: 8.8 mg/dL — ABNORMAL HIGH (ref 0.61–1.24)
Glucose, Bld: 90 mg/dL (ref 70–99)
HCT: 31 % — ABNORMAL LOW (ref 39.0–52.0)
Hemoglobin: 10.5 g/dL — ABNORMAL LOW (ref 13.0–17.0)
Potassium: 5.2 mmol/L — ABNORMAL HIGH (ref 3.5–5.1)
Sodium: 135 mmol/L (ref 135–145)
TCO2: 32 mmol/L (ref 22–32)

## 2020-12-06 LAB — SARS CORONAVIRUS 2 BY RT PCR (HOSPITAL ORDER, PERFORMED IN ~~LOC~~ HOSPITAL LAB): SARS Coronavirus 2: NEGATIVE

## 2020-12-06 SURGERY — REVISON OF ARTERIOVENOUS FISTULA
Anesthesia: Monitor Anesthesia Care | Site: Arm Lower | Laterality: Right

## 2020-12-06 MED ORDER — LIDOCAINE 2% (20 MG/ML) 5 ML SYRINGE
INTRAMUSCULAR | Status: AC
  Filled 2020-12-06: qty 5

## 2020-12-06 MED ORDER — FENTANYL CITRATE (PF) 100 MCG/2ML IJ SOLN
INTRAMUSCULAR | Status: DC | PRN
  Administered 2020-12-06 (×2): 25 ug via INTRAVENOUS

## 2020-12-06 MED ORDER — SODIUM CHLORIDE 0.9 % IR SOLN: Status: DC | PRN

## 2020-12-06 MED ORDER — FENTANYL CITRATE (PF) 250 MCG/5ML IJ SOLN
INTRAMUSCULAR | Status: AC
  Filled 2020-12-06: qty 5

## 2020-12-06 MED ORDER — CHLORHEXIDINE GLUCONATE 0.12 % MT SOLN: 15.0000 mL | Freq: Once | OROMUCOSAL | Status: AC

## 2020-12-06 MED ORDER — CHLORHEXIDINE GLUCONATE 0.12 % MT SOLN
OROMUCOSAL | Status: AC
  Administered 2020-12-06: 07:00:00 15 mL via OROMUCOSAL
  Filled 2020-12-06: qty 15

## 2020-12-06 MED ORDER — ORAL CARE MOUTH RINSE: 15.0000 mL | Freq: Once | OROMUCOSAL | Status: AC

## 2020-12-06 MED ORDER — OXYCODONE-ACETAMINOPHEN 5-325 MG PO TABS: 1.0000 | ORAL_TABLET | Freq: Four times a day (QID) | ORAL | 0 refills | Status: DC | PRN

## 2020-12-06 MED ORDER — MIDAZOLAM HCL 5 MG/5ML IJ SOLN
INTRAMUSCULAR | Status: DC | PRN
  Administered 2020-12-06 (×2): 1 mg via INTRAVENOUS

## 2020-12-06 MED ORDER — ONDANSETRON HCL 4 MG/2ML IJ SOLN
INTRAMUSCULAR | Status: DC | PRN
Start: 2020-12-06 — End: 2020-12-06
  Administered 2020-12-06: 4 mg via INTRAVENOUS

## 2020-12-06 MED ORDER — LIDOCAINE-EPINEPHRINE 0.5 %-1:200000 IJ SOLN
INTRAMUSCULAR | Status: AC
  Filled 2020-12-06: qty 1

## 2020-12-06 MED ORDER — SODIUM CHLORIDE 0.9 % IV SOLN: INTRAVENOUS | Status: DC

## 2020-12-06 MED ORDER — SODIUM CHLORIDE 0.9 % IV SOLN
INTRAVENOUS | Status: AC
  Filled 2020-12-06: qty 1.2

## 2020-12-06 MED ORDER — MIDAZOLAM HCL 2 MG/2ML IJ SOLN
INTRAMUSCULAR | Status: AC
  Filled 2020-12-06: qty 2

## 2020-12-06 MED ORDER — CHLORHEXIDINE GLUCONATE 4 % EX LIQD: 60.0000 mL | Freq: Once | CUTANEOUS | Status: DC

## 2020-12-06 MED ORDER — CEFAZOLIN SODIUM-DEXTROSE 2-4 GM/100ML-% IV SOLN
2.0000 g | INTRAVENOUS | Status: AC
  Administered 2020-12-06: 09:00:00 2 g via INTRAVENOUS
  Filled 2020-12-06: qty 100

## 2020-12-06 MED ORDER — FENTANYL CITRATE (PF) 100 MCG/2ML IJ SOLN
25.0000 ug | INTRAMUSCULAR | Status: DC | PRN
  Administered 2020-12-06: 25 ug via INTRAVENOUS
  Administered 2020-12-06: 50 ug via INTRAVENOUS

## 2020-12-06 MED ORDER — ONDANSETRON HCL 4 MG/2ML IJ SOLN
INTRAMUSCULAR | Status: AC
  Filled 2020-12-06: qty 2

## 2020-12-06 MED ORDER — PROPOFOL 500 MG/50ML IV EMUL
INTRAVENOUS | Status: DC | PRN
  Administered 2020-12-06: 50 ug/kg/min via INTRAVENOUS

## 2020-12-06 MED ORDER — LIDOCAINE-EPINEPHRINE (PF) 1 %-1:200000 IJ SOLN
INTRAMUSCULAR | Status: AC
  Filled 2020-12-06: qty 60

## 2020-12-06 MED ORDER — PROPOFOL 10 MG/ML IV BOLUS
INTRAVENOUS | Status: AC
  Filled 2020-12-06: qty 20

## 2020-12-06 MED ORDER — LIDOCAINE-EPINEPHRINE 0.5 %-1:200000 IJ SOLN
INTRAMUSCULAR | Status: AC
  Filled 2020-12-06: qty 2

## 2020-12-06 MED ORDER — FENTANYL CITRATE (PF) 100 MCG/2ML IJ SOLN
INTRAMUSCULAR | Status: AC
  Filled 2020-12-06: qty 2

## 2020-12-06 MED ORDER — LACTATED RINGERS IV SOLN: INTRAVENOUS | Status: DC

## 2020-12-06 MED ORDER — SODIUM CHLORIDE 0.9 % IV SOLN: INTRAVENOUS | Status: DC | PRN

## 2020-12-06 MED ORDER — SODIUM CHLORIDE 0.9 % IV SOLN
INTRAVENOUS | Status: DC | PRN
  Administered 2020-12-06: 09:00:00 500 mL

## 2020-12-06 MED ORDER — HEPARIN SODIUM (PORCINE) 1000 UNIT/ML IJ SOLN
INTRAMUSCULAR | Status: AC
  Filled 2020-12-06: qty 1

## 2020-12-06 MED ORDER — 0.9 % SODIUM CHLORIDE (POUR BTL) OPTIME
TOPICAL | Status: DC | PRN
  Administered 2020-12-06: 09:00:00 1000 mL

## 2020-12-06 MED ORDER — LIDOCAINE-EPINEPHRINE 0.5 %-1:200000 IJ SOLN
INTRAMUSCULAR | Status: DC | PRN
  Administered 2020-12-06: 14 mL

## 2020-12-06 SURGICAL SUPPLY — 42 items
ADH SKN CLS APL DERMABOND .7 (GAUZE/BANDAGES/DRESSINGS) ×2
ARMBAND PINK RESTRICT EXTREMIT (MISCELLANEOUS) ×3 IMPLANT
BNDG ELASTIC 4X5.8 VLCR STR LF (GAUZE/BANDAGES/DRESSINGS) ×3 IMPLANT
CANISTER SUCT 3000ML PPV (MISCELLANEOUS) ×3 IMPLANT
CANNULA VESSEL 3MM 2 BLNT TIP (CANNULA) ×3 IMPLANT
CATH PALINDROME-P 19CM W/VT (CATHETERS) IMPLANT
CATH PALINDROME-P 23CM W/VT (CATHETERS) IMPLANT
CATH PALINDROME-P 28CM W/VT (CATHETERS) IMPLANT
CLIP LIGATING EXTRA MED SLVR (CLIP) ×3 IMPLANT
CLIP LIGATING EXTRA SM BLUE (MISCELLANEOUS) ×3 IMPLANT
COVER PROBE W GEL 5X96 (DRAPES) ×3 IMPLANT
COVER WAND RF STERILE (DRAPES) ×3 IMPLANT
DECANTER SPIKE VIAL GLASS SM (MISCELLANEOUS) ×3 IMPLANT
DERMABOND ADVANCED (GAUZE/BANDAGES/DRESSINGS) ×1
DERMABOND ADVANCED .7 DNX12 (GAUZE/BANDAGES/DRESSINGS) ×2 IMPLANT
DRAPE C-ARM 42X72 X-RAY (DRAPES) ×3 IMPLANT
ELECT REM PT RETURN 9FT ADLT (ELECTROSURGICAL) ×3
ELECTRODE REM PT RTRN 9FT ADLT (ELECTROSURGICAL) ×2 IMPLANT
GAUZE 4X4 16PLY RFD (DISPOSABLE) ×3 IMPLANT
GLOVE BIO SURGEON STRL SZ 6.5 (GLOVE) ×3 IMPLANT
GLOVE SS BIOGEL STRL SZ 7.5 (GLOVE) ×2 IMPLANT
GLOVE SUPERSENSE BIOGEL SZ 7.5 (GLOVE) ×1
GOWN STRL REUS W/ TWL LRG LVL3 (GOWN DISPOSABLE) ×6 IMPLANT
GOWN STRL REUS W/TWL LRG LVL3 (GOWN DISPOSABLE) ×9
KIT BASIN OR (CUSTOM PROCEDURE TRAY) ×3 IMPLANT
KIT PALINDROME-P 55CM (CATHETERS) IMPLANT
KIT TURNOVER KIT B (KITS) ×3 IMPLANT
NEEDLE HYPO 25GX1X1/2 BEV (NEEDLE) ×3 IMPLANT
NS IRRIG 1000ML POUR BTL (IV SOLUTION) ×3 IMPLANT
PACK CV ACCESS (CUSTOM PROCEDURE TRAY) ×3 IMPLANT
PACK SURGICAL SETUP 50X90 (CUSTOM PROCEDURE TRAY) ×3 IMPLANT
PAD ARMBOARD 7.5X6 YLW CONV (MISCELLANEOUS) ×6 IMPLANT
SUT PROLENE 5 0 C 1 24 (SUTURE) ×6 IMPLANT
SUT PROLENE 6 0 CC (SUTURE) ×3 IMPLANT
SUT VIC AB 3-0 SH 27 (SUTURE) ×3
SUT VIC AB 3-0 SH 27X BRD (SUTURE) ×2 IMPLANT
SYR 10ML LL (SYRINGE) ×3 IMPLANT
SYR CONTROL 10ML LL (SYRINGE) ×3 IMPLANT
TOWEL GREEN STERILE (TOWEL DISPOSABLE) ×3 IMPLANT
TOWEL GREEN STERILE FF (TOWEL DISPOSABLE) ×3 IMPLANT
UNDERPAD 30X36 HEAVY ABSORB (UNDERPADS AND DIAPERS) ×3 IMPLANT
WATER STERILE IRR 1000ML POUR (IV SOLUTION) ×3 IMPLANT

## 2020-12-06 NOTE — OR Nursing (Signed)
Pt is awake,alert and oriented. Pt is in NAD at this time. Pt and family verbalized understanding of poc and discharge instructions. instructions given to family and reviewed prior to discharge.   

## 2020-12-06 NOTE — Discharge Instructions (Signed)
° °  [facility] [place] ° °Discharge Instructions ° °AV Fistula or Graft Surgery for Dialysis Access ° °Please refer to the following instructions for your post-procedure care. Your surgeon or physician assistant will discuss any changes with you. ° °Activity ° °You may drive the day following your surgery, if you are comfortable and no longer taking prescription pain medication. Resume full activity as the soreness in your incision resolves. ° °Bathing/Showering ° °You may shower after you go home. Keep your incision dry for 48 hours. Do not soak in a bathtub, hot tub, or swim until the incision heals completely. You may not shower if you have a hemodialysis catheter. ° °Incision Care ° °Clean your incision with mild soap and water after 48 hours. Pat the area dry with a clean towel. You do not need a bandage unless otherwise instructed. Do not apply any ointments or creams to your incision. You may have skin glue on your incision. Do not peel it off. It will come off on its own in about one week. Your arm may swell a bit after surgery. To reduce swelling use pillows to elevate your arm so it is above your heart. Your doctor will tell you if you need to lightly wrap your arm with an ACE bandage. ° °Diet ° °Resume your normal diet. There are not special food restrictions following this procedure. In order to heal from your surgery, it is CRITICAL to get adequate nutrition. Your body requires vitamins, minerals, and protein. Vegetables are the best source of vitamins and minerals. Vegetables also provide the perfect balance of protein. Processed food has little nutritional value, so try to avoid this. ° °Medications ° °Resume taking all of your medications. If your incision is causing pain, you may take over-the counter pain relievers such as acetaminophen (Tylenol). If you were prescribed a stronger pain medication, please be aware these medications can cause nausea and constipation. Prevent  nausea by taking the medication with a snack or meal. Avoid constipation by drinking plenty of fluids and eating foods with high amount of fiber, such as fruits, vegetables, and grains. Do not take Tylenol if you are taking prescription pain medications. ° ° ° ° °Follow up °Your surgeon may want to see you in the office following your access surgery. If so, this will be arranged at the time of your surgery. ° °Please call us immediately for any of the following conditions: ° °Increased pain, redness, drainage (pus) from your incision site °Fever of 101 degrees or higher °Severe or worsening pain at your incision site °Hand pain or numbness. ° °Reduce your risk of vascular disease: ° °Stop smoking. If you would like help, call [facility] at 1-800-QUIT-NOW (1-800-784-8669) or [facility] at 336-586-4000 ° °Manage your cholesterol °Maintain a desired weight °Control your diabetes °Keep your blood pressure down ° °Dialysis ° °It will take several weeks to several months for your new dialysis access to be ready for use. Your surgeon will determine when it is OK to use it. Your nephrologist will continue to direct your dialysis. You can continue to use your Permcath until your new access is ready for use. ° °If you have any questions, please call the office at [phone]. ° °

## 2020-12-06 NOTE — Op Note (Signed)
    OPERATIVE REPORT  DATE OF SURGERY: 12/06/2020  PATIENT: Timothy Ponce, 64 y.o. 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  PHYSICIAN ASSISTANT: Nurse  The assistant was needed for exposure and to expedite the case  ANESTHESIA: Local with sedation  EBL: per anesthesia record  Total I/O In: 450 [I.V.:350; IV Piggyback:100] Out: 120 [Blood:120]  BLOOD ADMINISTERED: none  DRAINS: none  SPECIMEN: none  COUNTS CORRECT:  YES  PATIENT DISPOSITION:  PACU - hemodynamically stable  PROCEDURE DETAILS: Patient was taken to room placed position with area of the right arm prepped draped you sterile fashion.  The patient had skin breakdown over the more distal portion of his forearm AV fistula.  He had eschar present and there was risk for bleeding.  Using local anesthesia an ellipse of skin was made around the nonviable skin and also the deep pigmented skin.  This was then carried down to the aneurysmal vein.  The vein was isolated near the radial artery anastomosis and encircled for control.  Patient had normal size cephalic vein above this large aneurysmal segment and then had another large aneurysmal portion.  The upper aneurysmal portion did not have any skin breakdown.  After mobilization of the aneurysmal vein there was too much redundancy for simple plication.  For this reason I resected a segment of the vein and spatulated both the proximal and distal ends of this and then sewed the vein back into into itself with a running 5-0 Prolene suture.  Clamps were removed and excellent thrill was noted.  The wounds were irrigated with saline.  The skin was closed with a 3-0 subcuticular Vicryl stitch.   Sterile dressing and Ace wrap were applied and the patient was transferred to the recovery room in stable condition   Rosetta Posner, M.D., [facility] 12/06/2020 1:28 PM

## 2020-12-06 NOTE — Interval H&P Note (Signed)
History and Physical Interval Note:  12/06/2020 7:45 AM  Timothy Ponce  has presented today for surgery, with the diagnosis of ESRD.  The various methods of treatment have been discussed with the patient and family. After consideration of risks, benefits and other options for treatment, the patient has consented to  Procedure(s): REVISON OF RIGHT ARM FISTULA (Right) INSERTION OF DIALYSIS CATHETER (N/A) as a surgical intervention.  The patient's history has been reviewed, patient examined, no change in status, stable for surgery.  I have reviewed the patient's chart and labs.  Questions were answered to the patient's satisfaction.     Curt Jews

## 2020-12-06 NOTE — Transfer of Care (Signed)
Immediate Anesthesia Transfer of Care Note  Patient: Timothy Ponce  Procedure(s) Performed: REVISON OF RIGHT ARM FISTULA (Right Arm Lower)  Patient Location: PACU  Anesthesia Type:MAC  Level of Consciousness: awake, alert  and oriented  Airway & Oxygen Therapy: Patient Spontanous Breathing  Post-op Assessment: Report given to RN, Post -op Vital signs reviewed and stable and Patient moving all extremities X 4  Post vital signs: Reviewed and stable  Last Vitals:  Vitals Value Taken Time  BP    Temp    Pulse    Resp 24 12/06/20 1008  SpO2    Vitals shown include unvalidated device data.  Last Pain:  Vitals:   12/06/20 0706  TempSrc:   PainSc: 0-No pain      Patients Stated Pain Goal: 2 (26/33/35 4562)  Complications: No complications documented.

## 2020-12-06 NOTE — Anesthesia Procedure Notes (Signed)
Procedure Name: MAC Date/Time: 12/06/2020 8:30 AM Performed by: Harden Mo, CRNA Pre-anesthesia Checklist: Patient identified, Emergency Drugs available, Suction available and Patient being monitored Patient Re-evaluated:Patient Re-evaluated prior to induction Oxygen Delivery Method: Simple face mask Preoxygenation: Pre-oxygenation with 100% oxygen Induction Type: IV induction Placement Confirmation: positive ETCO2 and breath sounds checked- equal and bilateral Dental Injury: Teeth and Oropharynx as per pre-operative assessment

## 2020-12-07 ENCOUNTER — Encounter (HOSPITAL_COMMUNITY): Payer: Self-pay | Admitting: Vascular Surgery

## 2020-12-07 ENCOUNTER — Telehealth: Payer: Self-pay | Admitting: *Deleted

## 2020-12-07 NOTE — Anesthesia Postprocedure Evaluation (Signed)
Anesthesia Post Note  Patient: Timothy Ponce  Procedure(s) Performed: REVISON OF RIGHT ARM FISTULA (Right Arm Lower)     Patient location during evaluation: PACU Anesthesia Type: MAC Level of consciousness: awake and alert and oriented Pain management: pain level controlled Vital Signs Assessment: post-procedure vital signs reviewed and stable Respiratory status: spontaneous breathing, nonlabored ventilation and respiratory function stable Cardiovascular status: stable and blood pressure returned to baseline Postop Assessment: no apparent nausea or vomiting Anesthetic complications: no   No complications documented.  Last Vitals:  Vitals:   12/06/20 1039 12/06/20 1053  BP: (!) 109/51 (!) 126/47  Pulse: (!) 40 (!) 42  Resp: 10 11  Temp:  36.6 C  SpO2: 95% 100%    Last Pain:  Vitals:   12/06/20 1039  TempSrc:   PainSc: Asleep                 [name],[name]

## 2020-12-07 NOTE — Telephone Encounter (Signed)
Called pt to schedule follow up post fistula plication. Pt informed me that he was unable to dialyze this morning due to infiltration. Patient inquired about a dialysis catheter. Informed pt that a catheter is an option and I would speak with the dialysis center to get more information about what happened. Dialysis center nurse confirmed infiltrations and said the patient was coming back tomorrow for another attempt. Pt is nervous about further attempts but has agreed to try again tomorrow. Pt will call the answering service tomorrow if unable to dialyze again. Scheduled follow up for incision check in 4 weeks. Patient verbalized understanding.

## 2020-12-08 ENCOUNTER — Emergency Department (HOSPITAL_COMMUNITY): Payer: Medicare Other | Admitting: Certified Registered"

## 2020-12-08 ENCOUNTER — Other Ambulatory Visit: Payer: Self-pay

## 2020-12-08 ENCOUNTER — Encounter (HOSPITAL_COMMUNITY): Payer: Self-pay | Admitting: Emergency Medicine

## 2020-12-08 ENCOUNTER — Emergency Department (HOSPITAL_COMMUNITY): Payer: Medicare Other

## 2020-12-08 ENCOUNTER — Ambulatory Visit (HOSPITAL_COMMUNITY)
Admission: EM | Admit: 2020-12-08 | Discharge: 2020-12-08 | Disposition: A | Discharge status: Home / Self Care | Payer: Medicare Other | Attending: Emergency Medicine | Admitting: Emergency Medicine

## 2020-12-08 ENCOUNTER — Encounter (HOSPITAL_COMMUNITY): Admission: EM | Disposition: A | Discharge status: Home / Self Care | Payer: Self-pay | Source: Home / Self Care

## 2020-12-08 DIAGNOSIS — Z20822 Contact with and (suspected) exposure to covid-19: Secondary | ICD-10-CM | POA: Insufficient documentation

## 2020-12-08 DIAGNOSIS — Z992 Dependence on renal dialysis: Secondary | ICD-10-CM | POA: Insufficient documentation

## 2020-12-08 DIAGNOSIS — N186 End stage renal disease: Secondary | ICD-10-CM | POA: Insufficient documentation

## 2020-12-08 DIAGNOSIS — N185 Chronic kidney disease, stage 5: Secondary | ICD-10-CM | POA: Diagnosis not present

## 2020-12-08 DIAGNOSIS — Z888 Allergy status to other drugs, medicaments and biological substances status: Secondary | ICD-10-CM | POA: Diagnosis not present

## 2020-12-08 DIAGNOSIS — T829XXA Unspecified complication of cardiac and vascular prosthetic device, implant and graft, initial encounter: Secondary | ICD-10-CM | POA: Diagnosis present

## 2020-12-08 DIAGNOSIS — E875 Hyperkalemia: Secondary | ICD-10-CM | POA: Diagnosis present

## 2020-12-08 DIAGNOSIS — T82898A Other specified complication of vascular prosthetic devices, implants and grafts, initial encounter: Secondary | ICD-10-CM | POA: Diagnosis not present

## 2020-12-08 DIAGNOSIS — Z95828 Presence of other vascular implants and grafts: Secondary | ICD-10-CM

## 2020-12-08 DIAGNOSIS — T8241XA Breakdown (mechanical) of vascular dialysis catheter, initial encounter: Secondary | ICD-10-CM | POA: Diagnosis not present

## 2020-12-08 DIAGNOSIS — Z419 Encounter for procedure for purposes other than remedying health state, unspecified: Secondary | ICD-10-CM

## 2020-12-08 DIAGNOSIS — Y832 Surgical operation with anastomosis, bypass or graft as the cause of abnormal reaction of the patient, or of later complication, without mention of misadventure at the time of the procedure: Secondary | ICD-10-CM | POA: Diagnosis not present

## 2020-12-08 HISTORY — PX: INSERTION OF DIALYSIS CATHETER: SHX1324

## 2020-12-08 LAB — CBC
HCT: 30.6 % — ABNORMAL LOW (ref 39.0–52.0)
Hemoglobin: 9.2 g/dL — ABNORMAL LOW (ref 13.0–17.0)
MCH: 32.5 pg (ref 26.0–34.0)
MCHC: 30.1 g/dL (ref 30.0–36.0)
MCV: 108.1 fL — ABNORMAL HIGH (ref 80.0–100.0)
Platelets: 129 10*3/uL — ABNORMAL LOW (ref 150–400)
RBC: 2.83 MIL/uL — ABNORMAL LOW (ref 4.22–5.81)
RDW: 14 % (ref 11.5–15.5)
WBC: 4.9 10*3/uL (ref 4.0–10.5)
nRBC: 0 % (ref 0.0–0.2)

## 2020-12-08 LAB — BASIC METABOLIC PANEL
Anion gap: 11 (ref 5–15)
BUN: 35 mg/dL — ABNORMAL HIGH (ref 8–23)
CO2: 32 mmol/L (ref 22–32)
Calcium: 9 mg/dL (ref 8.9–10.3)
Chloride: 94 mmol/L — ABNORMAL LOW (ref 98–111)
Creatinine, Ser: 13.53 mg/dL — ABNORMAL HIGH (ref 0.61–1.24)
GFR, Estimated: 4 mL/min — ABNORMAL LOW (ref >60)
Glucose, Bld: 87 mg/dL (ref 70–99)
Potassium: 5.8 mmol/L — ABNORMAL HIGH (ref 3.5–5.1)
Sodium: 137 mmol/L (ref 135–145)

## 2020-12-08 LAB — RESP PANEL BY RT-PCR (FLU A&B, COVID) ARPGX2
Influenza A by PCR: NEGATIVE
Influenza B by PCR: NEGATIVE
SARS Coronavirus 2 by RT PCR: NEGATIVE

## 2020-12-08 SURGERY — INSERTION OF DIALYSIS CATHETER
Anesthesia: Monitor Anesthesia Care | Site: Neck | Laterality: Right

## 2020-12-08 MED ORDER — FENTANYL CITRATE (PF) 100 MCG/2ML IJ SOLN
INTRAMUSCULAR | Status: DC | PRN
  Administered 2020-12-08: 25 ug via INTRAVENOUS

## 2020-12-08 MED ORDER — ONDANSETRON HCL 4 MG/2ML IJ SOLN
INTRAMUSCULAR | Status: DC | PRN
  Administered 2020-12-08: 4 mg via INTRAVENOUS

## 2020-12-08 MED ORDER — LIDOCAINE-EPINEPHRINE (PF) 1 %-1:200000 IJ SOLN
INTRAMUSCULAR | Status: DC | PRN
  Administered 2020-12-08: 30 mL

## 2020-12-08 MED ORDER — CHLORHEXIDINE GLUCONATE 0.12 % MT SOLN: 15.0000 mL | Freq: Once | OROMUCOSAL | Status: AC

## 2020-12-08 MED ORDER — PROPOFOL 500 MG/50ML IV EMUL
INTRAVENOUS | Status: DC | PRN
  Administered 2020-12-08: 50 ug/kg/min via INTRAVENOUS

## 2020-12-08 MED ORDER — HEPARIN SODIUM (PORCINE) 1000 UNIT/ML IJ SOLN
INTRAMUSCULAR | Status: AC
  Filled 2020-12-08: qty 1

## 2020-12-08 MED ORDER — HEPARIN SODIUM (PORCINE) 1000 UNIT/ML IJ SOLN
INTRAMUSCULAR | Status: DC | PRN
  Administered 2020-12-08: 1000 [IU] via INTRAVENOUS

## 2020-12-08 MED ORDER — ONDANSETRON HCL 4 MG/2ML IJ SOLN
INTRAMUSCULAR | Status: AC
  Filled 2020-12-08: qty 2

## 2020-12-08 MED ORDER — CHLORHEXIDINE GLUCONATE 0.12 % MT SOLN
OROMUCOSAL | Status: AC
  Administered 2020-12-08: 14:00:00 15 mL via OROMUCOSAL
  Filled 2020-12-08: qty 15

## 2020-12-08 MED ORDER — 0.9 % SODIUM CHLORIDE (POUR BTL) OPTIME
TOPICAL | Status: DC | PRN
  Administered 2020-12-08: 14:00:00 1000 mL

## 2020-12-08 MED ORDER — SODIUM CHLORIDE 0.9 % IV SOLN: INTRAVENOUS | Status: DC

## 2020-12-08 MED ORDER — FENTANYL CITRATE (PF) 100 MCG/2ML IJ SOLN
INTRAMUSCULAR | Status: AC
  Filled 2020-12-08: qty 2

## 2020-12-08 MED ORDER — SODIUM CHLORIDE 0.9 % IV SOLN
INTRAVENOUS | Status: AC
  Filled 2020-12-08: qty 1.2

## 2020-12-08 MED ORDER — FENTANYL CITRATE (PF) 100 MCG/2ML IJ SOLN: 25.0000 ug | INTRAMUSCULAR | Status: DC | PRN

## 2020-12-08 MED ORDER — ORAL CARE MOUTH RINSE: 15.0000 mL | Freq: Once | OROMUCOSAL | Status: AC

## 2020-12-08 MED ORDER — SODIUM CHLORIDE 0.9 % IV SOLN: INTRAVENOUS | Status: DC | PRN

## 2020-12-08 MED ORDER — MIDAZOLAM HCL 2 MG/2ML IJ SOLN
INTRAMUSCULAR | Status: DC | PRN
  Administered 2020-12-08: 2 mg via INTRAVENOUS

## 2020-12-08 MED ORDER — LIDOCAINE-EPINEPHRINE (PF) 1 %-1:200000 IJ SOLN
INTRAMUSCULAR | Status: AC
  Filled 2020-12-08: qty 30

## 2020-12-08 MED ORDER — PHENYLEPHRINE HCL-NACL 10-0.9 MG/250ML-% IV SOLN
INTRAVENOUS | Status: DC | PRN
  Administered 2020-12-08: 20 ug/min via INTRAVENOUS

## 2020-12-08 MED ORDER — OXYCODONE HCL 5 MG PO TABS
ORAL_TABLET | ORAL | Status: AC
  Filled 2020-12-08: qty 1

## 2020-12-08 MED ORDER — ACETAMINOPHEN 10 MG/ML IV SOLN: 1000.0000 mg | Freq: Once | INTRAVENOUS | Status: DC | PRN

## 2020-12-08 MED ORDER — ONDANSETRON HCL 4 MG/2ML IJ SOLN: 4.0000 mg | Freq: Once | INTRAMUSCULAR | Status: DC | PRN

## 2020-12-08 MED ORDER — SODIUM CHLORIDE 0.9 % IV SOLN
INTRAVENOUS | Status: DC | PRN
  Administered 2020-12-08: 500 mL via INTRAVENOUS

## 2020-12-08 MED ORDER — OXYCODONE HCL 5 MG PO TABS
5.0000 mg | ORAL_TABLET | Freq: Once | ORAL | Status: AC
  Administered 2020-12-08: 16:00:00 5 mg via ORAL

## 2020-12-08 MED ORDER — LIDOCAINE 2% (20 MG/ML) 5 ML SYRINGE
INTRAMUSCULAR | Status: DC | PRN
  Administered 2020-12-08: 40 mg via INTRAVENOUS

## 2020-12-08 MED ORDER — MIDAZOLAM HCL 2 MG/2ML IJ SOLN
INTRAMUSCULAR | Status: AC
  Filled 2020-12-08: qty 2

## 2020-12-08 MED ORDER — PROPOFOL 10 MG/ML IV BOLUS
INTRAVENOUS | Status: DC | PRN
  Administered 2020-12-08: 20 mg via INTRAVENOUS

## 2020-12-08 MED ORDER — SODIUM ZIRCONIUM CYCLOSILICATE 10 G PO PACK
10.0000 g | PACK | Freq: Once | ORAL | Status: AC
  Administered 2020-12-08: 17:00:00 10 g via ORAL
  Filled 2020-12-08: qty 1

## 2020-12-08 SURGICAL SUPPLY — 37 items
BAG DECANTER FOR FLEXI CONT (MISCELLANEOUS) ×3 IMPLANT
BIOPATCH RED 1 DISK 7.0 (GAUZE/BANDAGES/DRESSINGS) ×2 IMPLANT
BIOPATCH RED 1IN DISK 7.0MM (GAUZE/BANDAGES/DRESSINGS) ×1
CATH PALINDROME-P 19CM W/VT (CATHETERS) ×3 IMPLANT
CATH PALINDROME-P 23CM W/VT (CATHETERS) IMPLANT
CATH PALINDROME-P 28CM W/VT (CATHETERS) IMPLANT
COVER PROBE W GEL 5X96 (DRAPES) ×3 IMPLANT
COVER SURGICAL LIGHT HANDLE (MISCELLANEOUS) ×3 IMPLANT
COVER WAND RF STERILE (DRAPES) ×3 IMPLANT
DERMABOND ADVANCED (GAUZE/BANDAGES/DRESSINGS) ×2
DERMABOND ADVANCED .7 DNX12 (GAUZE/BANDAGES/DRESSINGS) ×1 IMPLANT
DRAPE C-ARM 42X72 X-RAY (DRAPES) ×3 IMPLANT
DRAPE CHEST BREAST 15X10 FENES (DRAPES) ×3 IMPLANT
GAUZE 4X4 16PLY RFD (DISPOSABLE) ×3 IMPLANT
GLOVE BIO SURGEON STRL SZ7.5 (GLOVE) ×3 IMPLANT
GOWN STRL REUS W/ TWL LRG LVL3 (GOWN DISPOSABLE) ×1 IMPLANT
GOWN STRL REUS W/ TWL XL LVL3 (GOWN DISPOSABLE) ×1 IMPLANT
GOWN STRL REUS W/TWL LRG LVL3 (GOWN DISPOSABLE) ×3
GOWN STRL REUS W/TWL XL LVL3 (GOWN DISPOSABLE) ×3
KIT BASIN OR (CUSTOM PROCEDURE TRAY) ×3 IMPLANT
KIT PALINDROME-P 55CM (CATHETERS) IMPLANT
KIT TURNOVER KIT B (KITS) ×3 IMPLANT
NEEDLE 18GX1X1/2 (RX/OR ONLY) (NEEDLE) ×3 IMPLANT
NEEDLE HYPO 25GX1X1/2 BEV (NEEDLE) ×3 IMPLANT
NS IRRIG 1000ML POUR BTL (IV SOLUTION) ×3 IMPLANT
PACK SURGICAL SETUP 50X90 (CUSTOM PROCEDURE TRAY) ×3 IMPLANT
PAD ARMBOARD 7.5X6 YLW CONV (MISCELLANEOUS) ×6 IMPLANT
SOAP 2 % CHG 4 OZ (WOUND CARE) ×3 IMPLANT
SUT ETHILON 3 0 PS 1 (SUTURE) ×3 IMPLANT
SUT MNCRL AB 4-0 PS2 18 (SUTURE) ×3 IMPLANT
SYR 10ML LL (SYRINGE) ×3 IMPLANT
SYR 20ML LL LF (SYRINGE) ×6 IMPLANT
SYR 5ML LL (SYRINGE) ×3 IMPLANT
SYR CONTROL 10ML LL (SYRINGE) ×3 IMPLANT
TOWEL GREEN STERILE (TOWEL DISPOSABLE) ×3 IMPLANT
TOWEL GREEN STERILE FF (TOWEL DISPOSABLE) ×6 IMPLANT
WATER STERILE IRR 1000ML POUR (IV SOLUTION) ×3 IMPLANT

## 2020-12-08 NOTE — Transfer of Care (Signed)
Immediate Anesthesia Transfer of Care Note  Patient: Timothy Ponce  Procedure(s) Performed: INSERTION OF DIALYSIS CATHETER USING 19cm PALINDROME CHRONIC CATHETER (Right Neck)  Patient Location: PACU  Anesthesia Type:MAC  Level of Consciousness: awake, alert  and oriented  Airway & Oxygen Therapy: Patient Spontanous Breathing  Post-op Assessment: Report given to RN and Post -op Vital signs reviewed and stable  Post vital signs: Reviewed and stable  Last Vitals:  Vitals Value Taken Time  BP 168/80 12/08/20 1456  Temp    Pulse 61 12/08/20 1457  Resp 11 12/08/20 1457  SpO2 100 % 12/08/20 1457  Vitals shown include unvalidated device data.  Last Pain:  Vitals:   12/08/20 1230  TempSrc: Oral  PainSc:          Complications: No complications documented.

## 2020-12-08 NOTE — Consult Note (Signed)
[place]    Reason for Consult: Nonfunctioning right arm AV fistula Referring Physician: Dr. Ralene Bathe MRN #:  [id number]  History of Present Illness: This is a 64 y.o. male recent history of plication right arm AV fistula for scab.  He had an infiltration event yesterday they reattempted dialysis today with a separate infiltration event.  He is in need of dialysis is here for catheter.  He does not take blood thinners.  Past Medical History:  Diagnosis Date  . Cataract   . Complication of anesthesia    "Awakens during surgery"  . Dyspnea   . ESRD (end stage renal disease) ([place])    TTHSAT, [place]   . Glaucoma   . Neuropathy    hands - per patient    Past Surgical History:  Procedure Laterality Date  . AMPUTATION Right 06/02/2020   Procedure: RIGHT FOOT 5TH RAY AMPUTATION;  Surgeon: Newt Minion, MD;  Location: [facility];  Service: Orthopedics;  Laterality: Right;  . AV FISTULA PLACEMENT    . REVISON OF ARTERIOVENOUS FISTULA Right 12/06/2020   Procedure: REVISON OF RIGHT ARM FISTULA;  Surgeon: Rosetta Posner, MD;  Location: [facility];  Service: Vascular;  Laterality: Right;    Allergies  Allergen Reactions  . Darvon [Propoxyphene] Other (See Comments)    Lungs filling up with fluid (reaction to Darvon/Darvocet)  . Iron Dextran Other (See Comments)    Other reaction(s): Breathing Problems  . Lac Bovis Other (See Comments)    Gi upset  . Lactose Intolerance (Gi) Other (See Comments)    Gi upset/ sugar in milk    Prior to Admission medications   Medication Sig Start Date End Date Taking? Authorizing Provider  acetaminophen (TYLENOL) 650 MG CR tablet Take 650 mg by mouth every 8 (eight) hours as needed for pain (At the time of dialysis).    [provider]  ALPHAGAN P 0.1 % SOLN Place 1 drop into both eyes in the morning and at bedtime.  01/14/20   [provider]  cinacalcet (SENSIPAR) 30 MG tablet Take 30 mg by mouth daily with supper. 07/20/20   [provider]  dorzolamide-timolol (COSOPT) 22.3-6.8 MG/ML ophthalmic solution Place 1 drop into both eyes 2 (two) times daily.  02/05/20   [provider]  heparin 1000 unit/mL SOLN injection Heparin Sodium (Porcine) 1,000 Units/mL Systemic 10/21/20 10/20/21  [provider]  ibuprofen (ADVIL) 200 MG tablet Take 200-400 mg by mouth every 6 (six) hours as needed for moderate pain.    [provider]  LUMIGAN 0.01 % SOLN Place 1 drop into both eyes at bedtime.  01/31/20   [provider]  Methoxy PEG-Epoetin Beta (MIRCERA IJ) Mircera 12/30/19 12/28/20  [provider]  multivitamin (RENA-VIT) TABS tablet Take 1 tablet by mouth daily.  10/25/14   [provider]  oxyCODONE-acetaminophen (PERCOCET/ROXICET) 5-325 MG tablet Take 1 tablet by mouth every 6 (six) hours as needed. 12/06/20   Ulyses Amor, PA-C  RHOPRESSA 0.02 % SOLN Place 1 drop into both eyes daily.  01/18/20   [provider]  sevelamer carbonate (RENVELA) 800 MG tablet Take 1,600-4,000 mg by mouth See admin instructions. Takes 4000 mg  tablets with each meal and 1600 mg tablets snacks. 01/14/20   [provider]    Social History   Socioeconomic History  . Marital status: Single    Spouse name: Not on file  . Number of children: Not on file  . Years of  education: Not on file  . Highest education level: Not on file  Occupational History  . Not on file  Tobacco Use  . Smoking status: Never Smoker  . Smokeless tobacco: Never Used  Vaping Use  . Vaping Use: Never used  Substance and Sexual Activity  . Alcohol use: Never  . Drug use: Never  . Sexual activity: Not on file  Other Topics Concern  . Not on file  Social History Narrative  . Not on file   Social Determinants of Health   Financial Resource Strain: Not on file  Food Insecurity: Not on file  Transportation Needs: Not on file  Physical Activity: Not on file  Stress: Not on file  Social  Connections: Not on file  Intimate Partner Violence: Not on file     History reviewed. No pertinent family history.  ROS:  Right arm pain   Physical Examination  Vitals:   12/08/20 1003 12/08/20 1130  BP: (!) 153/102 128/68  Pulse: 68 72  Resp: 18 12  Temp: 98.1 F (36.7 C)   SpO2: 100% 100%   Body mass index is 20.07 kg/m.  General: No acute distress HENT: WNL, normocephalic Pulmonary: normal non-labored breathing Cardiac: Palpable right radial pulse Abdomen: soft, NT/ND, no masses Extremities: Right arm has edema, incision is intact, there is flow through his fistula with Band-Aids at previous access sites Neurologic: A&O X 3; Appropriate Affect ; SENSATION: normal; MOTOR FUNCTION:  moving all extremities equally. Speech is fluent/normal   CBC    Component Value Date/Time   WBC 4.9 12/08/2020 1050   RBC 2.83 (L) 12/08/2020 1050   HGB 9.2 (L) 12/08/2020 1050   HCT 30.6 (L) 12/08/2020 1050   PLT 129 (L) 12/08/2020 1050   MCV 108.1 (H) 12/08/2020 1050   MCH 32.5 12/08/2020 1050   MCHC 30.1 12/08/2020 1050   RDW 14.0 12/08/2020 1050   LYMPHSABS 1.2 05/30/2020 1417   MONOABS 0.6 05/30/2020 1417   EOSABS 0.5 05/30/2020 1417   BASOSABS 0.0 05/30/2020 1417    BMET    Component Value Date/Time   NA 137 12/08/2020 1050   K 5.8 (H) 12/08/2020 1050   CL 94 (L) 12/08/2020 1050   CO2 32 12/08/2020 1050   GLUCOSE 87 12/08/2020 1050   BUN 35 (H) 12/08/2020 1050   CREATININE 13.53 (H) 12/08/2020 1050   CALCIUM 9.0 12/08/2020 1050   GFRNONAA 4 (L) 12/08/2020 1050   GFRAA 4 (L) 06/06/2020 0654    COAGS: No results found for: INR, PROTIME   Non-Invasive Vascular Imaging:   None performed   ASSESSMENT/PLAN: This is a 64 y.o. male status post revision right arm AV fistula.  Plan will be for placement of tunneled dialysis catheter today.  We will allow the arm to rest for a few weeks reevaluate in the office at his previously scheduled follow-up.  Chesky Heyer C.  Donzetta Matters, MD [facility] Office: [phone] Pager: [phone]

## 2020-12-08 NOTE — ED Triage Notes (Signed)
Pt arrives to ED with c/o vascular assess problem. Per pt he had his right forearm fistula revised last week and it cannot be used for 12 weeks. Pt is here today to get dialysis catheter placed due to multiple infiltration by dialysis nurses this week. Pt last dialyzed on Tuesday. No CP or SOB.

## 2020-12-08 NOTE — ED Provider Notes (Signed)
[facility] EMERGENCY DEPARTMENT Provider Note   CSN: [id number] Arrival date & time: 12/08/20  8003     History Chief Complaint  Patient presents with  . Vascular Access Problem    Timothy Ponce is a 64 y.o. male.  The history is provided by the patient.   Timothy Ponce is a 64 y.o. male who presents to the Emergency Department complaining of vascular access problem. He has a history of end-stage renal disease and has been on dialysis for the last 30 years. He normally dialyzed is Tuesday, Thursday, Saturday. On Wednesday of this week he had a revision of his vascular access site performed by Dr. Donnetta Hutching. Yesterday he went to dialysis and they attempted to access a separate site in the area infiltrated. He was told to return today for a second attempt. Today when he went to the dialysis center the site infiltrated a second time. He is requesting vast cath placement so he can receive dialysis. He reports pain at the site of infiltration that does not improve with oxycodone. His post surgical pain is well-controlled with oxycodone. He denies any fevers, chest pain, shortness of breath, nausea, vomiting, diarrhea. He does not take any blood thinners. He sees Dr. Jimmy Footman with [facility].  Has been vaccinated for covid 19    Past Medical History:  Diagnosis Date  . Cataract   . Complication of anesthesia    "Awakens during surgery"  . Dyspnea   . ESRD (end stage renal disease) ([place])    TTHSAT, [place]   . Glaucoma   . Neuropathy    hands - per patient    Patient Active Problem List   Diagnosis Date Noted  . Encounter for removal of sutures 06/24/2020  . ESRD on hemodialysis ([place]) 05/31/2020  . Anemia of chronic renal failure 05/31/2020  . Peripheral arterial disease ([place]) 05/31/2020  . Osteomyelitis of fifth toe of right foot ([place]) 05/30/2020  . Diarrhea, unspecified 05/17/2020  . Allergy, unspecified, initial encounter 10/04/2019   . Other disorders of phosphorus metabolism 12/05/2014  . Aftercare including intermittent dialysis ([place]) 09/14/2014  . Fever, unspecified 09/14/2014  . Headache, unspecified 09/14/2014  . Coagulation defect, unspecified ([place]) 09/14/2014  . Pruritus, unspecified 09/14/2014  . Pain, unspecified 08/24/2014  . Hyperkalemia 12/03/2013  . Encounter for immunization 09/25/2013  . Hypercalcemia 08/03/2013  . Other abnormal glucose 09/25/2012  . 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 Secondary hyperparathyroidism of renal origin ([place]) 06/16/2002  . Iron deficiency anemia, unspecified 09/18/2001    Past Surgical History:  Procedure Laterality Date  . 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 AV FISTULA PLACEMENT    . REVISON OF ARTERIOVENOUS FISTULA Right 12/06/2020   Procedure: REVISON OF RIGHT ARM FISTULA;  Surgeon: Rosetta Posner, MD;  Location: [facility];  Service: Vascular;  Laterality: Right;       History reviewed. No pertinent family history.  Social History   Tobacco Use  . Smoking status: Never Smoker  . Smokeless tobacco: Never Used  Vaping Use  . Vaping Use: Never used  Substance Use Topics  . Alcohol use: Never  . Drug use: Never    Home Medications Prior to Admission medications   Medication Sig Start Date End Date Taking? Authorizing Provider  acetaminophen (TYLENOL) 650 MG CR tablet Take 650 mg by mouth daily as  needed (foot pain).   Yes [provider]  bimatoprost (LUMIGAN) 0.01 % SOLN Place 1 drop into both eyes at bedtime.   Yes [provider]  brimonidine (ALPHAGAN P) 0.1 % SOLN Place 1 drop into both eyes 2 (two) times daily.   Yes [provider]  cinacalcet (SENSIPAR) 30 MG tablet Take 30 mg by mouth daily with supper. 07/20/20  Yes [provider]  dorzolamide-timolol (COSOPT) 22.3-6.8 MG/ML  ophthalmic solution Place 1 drop into both eyes 2 (two) times daily.  02/05/20  Yes [provider]  ibuprofen (ADVIL) 200 MG tablet Take 200-400 mg by mouth every 6 (six) hours as needed for moderate pain.   Yes [provider]  multivitamin (RENA-VIT) TABS tablet Take 1 tablet by mouth daily.  10/25/14  Yes [provider]  Netarsudil Dimesylate (RHOPRESSA) 0.02 % SOLN Place 1 drop into both eyes at bedtime.   Yes [provider]  oxyCODONE-acetaminophen (PERCOCET/ROXICET) 5-325 MG tablet Take 1 tablet by mouth every 6 (six) hours as needed. Patient taking differently: Take 1 tablet by mouth every 6 (six) hours as needed (pain). 12/06/20  Yes Ulyses Amor, PA-C  sevelamer carbonate (RENVELA) 800 MG tablet Take 1,600-4,000 mg by mouth See admin instructions. Take 5 tablets (4000 mg) by mouth 2 -3 times daily with meals, take 2 tablets (1600 mg) with snacks 01/14/20  Yes [provider]  heparin 1000 unit/mL SOLN injection Heparin Sodium (Porcine) 1,000 Units/mL Systemic 10/21/20 10/20/21  [provider]  Methoxy PEG-Epoetin Beta (MIRCERA IJ) Mircera 12/30/19 12/28/20  [provider]    Allergies    Darvon [propoxyphene], Iron dextran, Lac bovis, and Lactose intolerance (gi)  Review of Systems   Review of Systems  All other systems reviewed and are negative.   Physical Exam Updated Vital Signs BP 114/63 (BP Location: Right Arm)   Pulse 70   Temp 98 F (36.7 C) (Oral)   Resp 17   Ht 5\' 8"  (1.727 m)   Wt 59.9 kg   SpO2 100%   BMI 20.07 kg/m   Physical Exam Vitals and nursing note reviewed.  Constitutional:      Appearance: He is well-developed and well-nourished.  HENT:     Head: Normocephalic and atraumatic.  Cardiovascular:     Rate and Rhythm: Normal rate and regular rhythm.     Heart sounds: No murmur heard.   Pulmonary:     Effort: Pulmonary effort is normal. No respiratory distress.     Breath sounds:  Normal breath sounds.  Abdominal:     Palpations: Abdomen is soft.     Tenderness: There is no abdominal tenderness. There is no guarding or rebound.  Musculoskeletal:        General: No edema.     Comments: Ace wrap and postop dressing in place to the right wrist - C/D/I. There is moderate generalized edema to the right upper extremity from the bicep to the digits. There is no erythema. Compartments are soft. Wiggles digits.  Skin:    General: Skin is warm and dry.  Neurological:     Mental Status: He is alert and oriented to person, place, and time.  Psychiatric:        Mood and Affect: Mood and affect normal.        Behavior: Behavior normal.     ED Results / Procedures / Treatments   Labs (all labs ordered are listed, but only abnormal results are displayed) Labs Reviewed  CBC - Abnormal; Notable for the following components:      Result Value   RBC 2.83 (*)    Hemoglobin 9.2 (*)    HCT 30.6 (*)    MCV 108.1 (*)    Platelets 129 (*)    All other components within normal limits  BASIC METABOLIC PANEL - Abnormal; Notable for the following components:   Potassium 5.8 (*)    Chloride 94 (*)    BUN 35 (*)    Creatinine, Ser 13.53 (*)    GFR, Estimated 4 (*)    All other components within normal limits  RESP PANEL BY RT-PCR (FLU A&B, COVID) [id number]    EKG EKG Interpretation  Date/Time:  Friday December 08 2020 10:52:34 EST Ventricular Rate:  67 PR Interval:    QRS Duration: 100 QT Interval:  420 QTC Calculation: 444 R Axis:   66 Text Interpretation: Sinus rhythm Borderline prolonged PR interval Confirmed by Quintella Reichert [id number]) on 12/08/2020 11:57:20 AM   Radiology No results found.  Procedures Procedures (including critical care time)  Medications Ordered in ED Medications  0.9 %  sodium chloride infusion (has no administration in time range)  0.9 % irrigation (POUR BTL) (1,000 mLs Irrigation Given 12/08/20 1355)  heparin 6,000 Units in sodium chloride  0.9 % 500 mL irrigation ( Irrigation Given 12/08/20 1356)  fentaNYL (SUBLIMAZE) 100 MCG/2ML injection (  Override pull for Anesthesia 12/08/20 1411)  midazolam (VERSED) 2 MG/2ML injection (  Override pull for Anesthesia 12/08/20 1402)  chlorhexidine (PERIDEX) 0.12 % solution 15 mL (15 mLs Mouth/Throat Given 12/08/20 1353)    Or  MEDLINE mouth rinse ( Mouth Rinse See Alternative 12/08/20 1353)    ED Course  I have reviewed the triage vital signs and the nursing notes.  Pertinent labs & imaging results that were available during my care of the patient were reviewed by me and considered in my medical decision making (see chart for details).    MDM Rules/Calculators/A&P                         patient is here for evaluation of inability to obtain dialysis due to vascular access issues. He does have edema to his right upper extremity, no evidence of acute infectious process. He is well perfused on evaluation. Labs with hyperkalemia at 5.8. EKG without acute changes. Discussed with Dr. Posey Pronto with nephrology. IR consulted for vas cath placement, they are unable to perform placement today. Nephrology consulted with vascular surgery regarding emergent placement today in the OR. Plan to admit to the OR for further treatment. His hyperkalemia and dialysis plans will be addressed after surgery.  Final Clinical Impression(s) / ED Diagnoses Final diagnoses:  Complication of vascular access for dialysis, initial encounter  Hyperkalemia    Rx / DC Orders ED Discharge Orders    None       Quintella Reichert, MD 12/08/20 1423

## 2020-12-08 NOTE — Anesthesia Preprocedure Evaluation (Signed)
Anesthesia Evaluation  Patient identified by MRN, date of birth, ID band Patient awake    Reviewed: Allergy & Precautions, NPO status , Patient's Chart, lab work & pertinent test results  History of Anesthesia Complications (+) AWARENESS UNDER ANESTHESIA and history of anesthetic complications  Airway Mallampati: II  TM Distance: >3 FB Neck ROM: Full    Dental no notable dental hx. (+) Teeth Intact   Pulmonary neg pulmonary ROS, shortness of breath,    Pulmonary exam normal breath sounds clear to auscultation       Cardiovascular + Peripheral Vascular Disease   Rhythm:Regular Rate:Normal     Neuro/Psych  Headaches, Glaucoma Macular corneal dystrophy negative psych ROS   GI/Hepatic negative GI ROS, Neg liver ROS,   Endo/Other  negative endocrine ROSHypercalcemia Secondary hyperparathyroidism  Renal/GU ESRF and DialysisRenal diseaseDialysis TThSat  negative genitourinary   Musculoskeletal negative musculoskeletal ROS (+)   Abdominal (+)  Abdomen: soft. Bowel sounds: normal.  Peds  Hematology  (+) anemia ,   Anesthesia Other Findings   Reproductive/Obstetrics                             Anesthesia Physical  Anesthesia Plan  ASA: III  Anesthesia Plan: MAC   Post-op Pain Management:    Induction: Intravenous  PONV Risk Score and Plan: 1 and Propofol infusion, Ondansetron, Treatment may vary due to age or medical condition and Midazolam  Airway Management Planned: Natural Airway, Simple Face Mask and Nasal Cannula  Additional Equipment: None  Intra-op Plan:   Post-operative Plan:   Informed Consent: I have reviewed the patients History and Physical, chart, labs and discussed the procedure including the risks, benefits and alternatives for the proposed anesthesia with the patient or authorized representative who has indicated his/her understanding and acceptance.     Dental  advisory given  Plan Discussed with: CRNA and Anesthesiologist  Anesthesia Plan Comments: (Lab Results      Component                Value               Date                      WBC                      4.9                 12/08/2020                HGB                      9.2 (L)             12/08/2020                HCT                      30.6 (L)            12/08/2020                MCV                      108.1 (H)           12/08/2020  PLT                      129 (L)             12/08/2020           Lab Results      Component                Value               Date                      NA                       137                 12/08/2020                K                        5.8 (H)             12/08/2020                CO2                      32                  12/08/2020                GLUCOSE                  87                  12/08/2020                BUN                      35 (H)              12/08/2020                CREATININE               13.53 (H)           12/08/2020                CALCIUM                  9.0                 12/08/2020                GFRNONAA                 4 (L)               12/08/2020                GFRAA                    4 (L)               06/06/2020          ,)        Anesthesia Quick Evaluation

## 2020-12-08 NOTE — Op Note (Signed)
    Patient name: Timothy Ponce MRN: [id number] DOB: 09-03-1956 Sex: male  12/08/2020 Pre-operative Diagnosis: End-stage renal disease, malfunction right arm AV fistula Post-operative diagnosis:  Same Surgeon:  Eda Paschal. Donzetta Matters, MD Procedure Performed:  Ultrasound fluoroscopic guided placement of 19 cm tunneled dialysis catheter  Indications: 64 year old male with end-stage renal disease.  He recently underwent revision of right arm fistula which has now been infiltrated on the last 2 attempted cannulation.  He has significant swelling in the right upper extremity and is indicated for tunneled dialysis catheter to rest the arm.   Procedure:  The patient was identified in the holding area and taken to the operating room where MAC anesthesia was induced.  He was sterilely prepped draped in the bilateral neck and chest in usual fashion antibiotics were up-to-date a timeout was called.  Ultrasound was used to identify the right internal jugular vein which was noted to be patent and compressible.  This was cannulated with 18-gauge needle wire was passed centrally with fluoroscopic guidance.  A counterincision was made in 19 cm catheter was tunneled.  The wire tract was serially dilated and introducer sheath was placed under fluoroscopic guidance.  The catheter was placed to the atriocaval junction.  Flushed with heparinized saline affixed the skin with 3-0 nylon suture.  4 Monocryl was placed in the neck and Dermabond placed to both sites.  Catheter was locked with concentrated heparin at the manufactures recommended dosing.  Sterile dressing was applied.  He was awakened from anesthesia having tolerated procedure without any complication.  EBL: 10 cc   Timothy Mccahill C. Donzetta Matters, MD [facility] Office: [phone] Pager: [phone]

## 2020-12-10 ENCOUNTER — Encounter (HOSPITAL_COMMUNITY): Payer: Self-pay | Admitting: Vascular Surgery

## 2020-12-10 DIAGNOSIS — T829XXA Unspecified complication of cardiac and vascular prosthetic device, implant and graft, initial encounter: Secondary | ICD-10-CM | POA: Insufficient documentation

## 2020-12-11 NOTE — Anesthesia Postprocedure Evaluation (Signed)
Anesthesia Post Note  Patient: Giavanni Odonovan Kassel  Procedure(s) Performed: INSERTION OF DIALYSIS CATHETER USING 19cm PALINDROME CHRONIC CATHETER (Right Neck)     Patient location during evaluation: PACU Anesthesia Type: MAC Level of consciousness: awake and alert Pain management: pain level controlled Vital Signs Assessment: post-procedure vital signs reviewed and stable Respiratory status: spontaneous breathing, nonlabored ventilation, respiratory function stable and patient connected to nasal cannula oxygen Cardiovascular status: stable and blood pressure returned to baseline Postop Assessment: no apparent nausea or vomiting Anesthetic complications: no   No complications documented.  Last Vitals:  Vitals:   12/08/20 1600 12/08/20 1625  BP: (!) 141/69 125/61  Pulse: 64 60  Resp: 15 14  Temp:  (!) 36.4 C  SpO2: 100% 99%    Last Pain:  Vitals:   12/08/20 1625  TempSrc:   PainSc: 0-No pain                 Belenda Cruise P Kalyse Meharg

## 2020-12-16 DIAGNOSIS — Z992 Dependence on renal dialysis: Secondary | ICD-10-CM | POA: Diagnosis not present

## 2020-12-16 DIAGNOSIS — N186 End stage renal disease: Secondary | ICD-10-CM | POA: Diagnosis not present

## 2020-12-16 DIAGNOSIS — I120 Hypertensive chronic kidney disease with stage 5 chronic kidney disease or end stage renal disease: Secondary | ICD-10-CM | POA: Diagnosis not present

## 2020-12-17 DIAGNOSIS — D509 Iron deficiency anemia, unspecified: Secondary | ICD-10-CM | POA: Diagnosis not present

## 2020-12-17 DIAGNOSIS — Z992 Dependence on renal dialysis: Secondary | ICD-10-CM | POA: Diagnosis not present

## 2020-12-17 DIAGNOSIS — N186 End stage renal disease: Secondary | ICD-10-CM | POA: Diagnosis not present

## 2020-12-17 DIAGNOSIS — D631 Anemia in chronic kidney disease: Secondary | ICD-10-CM | POA: Diagnosis not present

## 2020-12-17 DIAGNOSIS — N2581 Secondary hyperparathyroidism of renal origin: Secondary | ICD-10-CM | POA: Diagnosis not present

## 2020-12-19 DIAGNOSIS — D509 Iron deficiency anemia, unspecified: Secondary | ICD-10-CM | POA: Diagnosis not present

## 2020-12-19 DIAGNOSIS — N2581 Secondary hyperparathyroidism of renal origin: Secondary | ICD-10-CM | POA: Diagnosis not present

## 2020-12-19 DIAGNOSIS — N186 End stage renal disease: Secondary | ICD-10-CM | POA: Diagnosis not present

## 2020-12-19 DIAGNOSIS — D631 Anemia in chronic kidney disease: Secondary | ICD-10-CM | POA: Diagnosis not present

## 2020-12-19 DIAGNOSIS — Z992 Dependence on renal dialysis: Secondary | ICD-10-CM | POA: Diagnosis not present

## 2020-12-21 DIAGNOSIS — D509 Iron deficiency anemia, unspecified: Secondary | ICD-10-CM | POA: Diagnosis not present

## 2020-12-21 DIAGNOSIS — D631 Anemia in chronic kidney disease: Secondary | ICD-10-CM | POA: Diagnosis not present

## 2020-12-21 DIAGNOSIS — N2581 Secondary hyperparathyroidism of renal origin: Secondary | ICD-10-CM | POA: Diagnosis not present

## 2020-12-21 DIAGNOSIS — N186 End stage renal disease: Secondary | ICD-10-CM | POA: Diagnosis not present

## 2020-12-21 DIAGNOSIS — Z992 Dependence on renal dialysis: Secondary | ICD-10-CM | POA: Diagnosis not present

## 2020-12-26 DIAGNOSIS — D509 Iron deficiency anemia, unspecified: Secondary | ICD-10-CM | POA: Diagnosis not present

## 2020-12-26 DIAGNOSIS — N2581 Secondary hyperparathyroidism of renal origin: Secondary | ICD-10-CM | POA: Diagnosis not present

## 2020-12-26 DIAGNOSIS — D631 Anemia in chronic kidney disease: Secondary | ICD-10-CM | POA: Diagnosis not present

## 2020-12-26 DIAGNOSIS — N186 End stage renal disease: Secondary | ICD-10-CM | POA: Diagnosis not present

## 2020-12-26 DIAGNOSIS — Z992 Dependence on renal dialysis: Secondary | ICD-10-CM | POA: Diagnosis not present

## 2020-12-28 DIAGNOSIS — D631 Anemia in chronic kidney disease: Secondary | ICD-10-CM | POA: Diagnosis not present

## 2020-12-28 DIAGNOSIS — N2581 Secondary hyperparathyroidism of renal origin: Secondary | ICD-10-CM | POA: Diagnosis not present

## 2020-12-28 DIAGNOSIS — D509 Iron deficiency anemia, unspecified: Secondary | ICD-10-CM | POA: Diagnosis not present

## 2020-12-28 DIAGNOSIS — N186 End stage renal disease: Secondary | ICD-10-CM | POA: Diagnosis not present

## 2020-12-28 DIAGNOSIS — Z992 Dependence on renal dialysis: Secondary | ICD-10-CM | POA: Diagnosis not present

## 2020-12-30 DIAGNOSIS — N186 End stage renal disease: Secondary | ICD-10-CM | POA: Diagnosis not present

## 2020-12-30 DIAGNOSIS — Z992 Dependence on renal dialysis: Secondary | ICD-10-CM | POA: Diagnosis not present

## 2020-12-30 DIAGNOSIS — D631 Anemia in chronic kidney disease: Secondary | ICD-10-CM | POA: Diagnosis not present

## 2020-12-30 DIAGNOSIS — D509 Iron deficiency anemia, unspecified: Secondary | ICD-10-CM | POA: Diagnosis not present

## 2020-12-30 DIAGNOSIS — N2581 Secondary hyperparathyroidism of renal origin: Secondary | ICD-10-CM | POA: Diagnosis not present

## 2021-01-04 ENCOUNTER — Other Ambulatory Visit: Payer: Self-pay

## 2021-01-04 ENCOUNTER — Ambulatory Visit (INDEPENDENT_AMBULATORY_CARE_PROVIDER_SITE_OTHER): Payer: Self-pay | Admitting: Physician Assistant

## 2021-01-04 VITALS — BP 138/66 | HR 56 | Temp 98.1°F | Resp 20 | Ht 68.0 in | Wt 134.0 lb

## 2021-01-04 DIAGNOSIS — N186 End stage renal disease: Secondary | ICD-10-CM | POA: Diagnosis not present

## 2021-01-04 DIAGNOSIS — N2581 Secondary hyperparathyroidism of renal origin: Secondary | ICD-10-CM | POA: Diagnosis not present

## 2021-01-04 DIAGNOSIS — D631 Anemia in chronic kidney disease: Secondary | ICD-10-CM | POA: Diagnosis not present

## 2021-01-04 DIAGNOSIS — Z992 Dependence on renal dialysis: Secondary | ICD-10-CM | POA: Diagnosis not present

## 2021-01-04 DIAGNOSIS — D509 Iron deficiency anemia, unspecified: Secondary | ICD-10-CM | POA: Diagnosis not present

## 2021-01-04 NOTE — Progress Notes (Signed)
  POST OPERATIVE OFFICE NOTE    CC:  F/u for surgery  HPI:  This is a 65 y.o. male who is s/p revision plication of right  on 12/06/21 by Dr. Donnetta Hutching.  Followed by [facility] placement  By Dr. Donzetta Matters 12/08/21.    Pt returns today for follow up.  He denise pain, loss of sensation and los of motor.  He is currently on HD via right [facility] for the last 4 weeks.      Allergies  Allergen Reactions  . Darvon [Propoxyphene] Other (See Comments)    Lungs filling up with fluid (reaction to Darvon/Darvocet)  . Iron Dextran Other (See Comments)    Other reaction(s): Breathing Problems  . Lac Bovis Other (See Comments)    Gi upset  . Lactose Intolerance (Gi) Other (See Comments)    Gi upset/ sugar in milk    Current Outpatient Medications  Medication Sig Dispense Refill  . acetaminophen (TYLENOL) 650 MG CR tablet Take 650 mg by mouth daily as needed (foot pain).    . bimatoprost (LUMIGAN) 0.01 % SOLN Place 1 drop into both eyes at bedtime.    . brimonidine (ALPHAGAN P) 0.1 % SOLN Place 1 drop into both eyes 2 (two) times daily.    . cinacalcet (SENSIPAR) 30 MG tablet Take 30 mg by mouth daily with supper.    . dorzolamide-timolol (COSOPT) 22.3-6.8 MG/ML ophthalmic solution Place 1 drop into both eyes 2 (two) times daily.     . heparin 1000 unit/mL SOLN injection Heparin Sodium (Porcine) 1,000 Units/mL Systemic    . heparin 1000 unit/mL SOLN injection Heparin Sodium (Porcine) 1,000 Units/mL Catheter Lock Arterial    . ibuprofen (ADVIL) 200 MG tablet Take 200-400 mg by mouth every 6 (six) hours as needed for moderate pain.    . multivitamin (RENA-VIT) TABS tablet Take 1 tablet by mouth daily.     Mckinley Jewel Dimesylate (RHOPRESSA) 0.02 % SOLN Place 1 drop into both eyes at bedtime.    Marland Kitchen oxyCODONE-acetaminophen (PERCOCET/ROXICET) 5-325 MG tablet Take 1 tablet by mouth every 6 (six) hours as needed. (Patient taking differently: Take 1 tablet by mouth every 6 (six) hours as needed (pain).) 10 tablet 0  .  sevelamer carbonate (RENVELA) 800 MG tablet Take 1,600-4,000 mg by mouth See admin instructions. Take 5 tablets (4000 mg) by mouth 2 -3 times daily with meals, take 2 tablets (1600 mg) with snacks     No current facility-administered medications for this visit.     ROS:  See HPI  Physical Exam:      Incision:  Well healed  Extremities:  Palpable thrill in fistula and palpable radial pulse.  Proximal aneurysm with thin shiny skin that Is not mobile.  Assessment/Plan:  This is a 65 y.o. male who is s/p: Distal aneurysm plication and TDC placement.   The patient states that Dr. Donzetta Matters will continue to observe the proximal aneurysm and if he has problems with ulcer or prolonged bleeding it can be treated as well with plication revision.     The fistula may be accessed on 01/06/21 4 weeks out from revision.  Once the fistula is working well the [facility] may be removed.  F/U PRN  Roxy Horseman PA-C  [facility] [phone]  Clinic MD:  Oneida Alar

## 2021-01-06 DIAGNOSIS — Z992 Dependence on renal dialysis: Secondary | ICD-10-CM | POA: Diagnosis not present

## 2021-01-06 DIAGNOSIS — D631 Anemia in chronic kidney disease: Secondary | ICD-10-CM | POA: Diagnosis not present

## 2021-01-06 DIAGNOSIS — D509 Iron deficiency anemia, unspecified: Secondary | ICD-10-CM | POA: Diagnosis not present

## 2021-01-06 DIAGNOSIS — N186 End stage renal disease: Secondary | ICD-10-CM | POA: Diagnosis not present

## 2021-01-06 DIAGNOSIS — N2581 Secondary hyperparathyroidism of renal origin: Secondary | ICD-10-CM | POA: Diagnosis not present

## 2021-01-09 DIAGNOSIS — D631 Anemia in chronic kidney disease: Secondary | ICD-10-CM | POA: Diagnosis not present

## 2021-01-09 DIAGNOSIS — N186 End stage renal disease: Secondary | ICD-10-CM | POA: Diagnosis not present

## 2021-01-09 DIAGNOSIS — D509 Iron deficiency anemia, unspecified: Secondary | ICD-10-CM | POA: Diagnosis not present

## 2021-01-09 DIAGNOSIS — N2581 Secondary hyperparathyroidism of renal origin: Secondary | ICD-10-CM | POA: Diagnosis not present

## 2021-01-09 DIAGNOSIS — Z992 Dependence on renal dialysis: Secondary | ICD-10-CM | POA: Diagnosis not present

## 2021-01-10 ENCOUNTER — Ambulatory Visit: Payer: Medicare Other | Admitting: Podiatry

## 2021-01-11 DIAGNOSIS — D509 Iron deficiency anemia, unspecified: Secondary | ICD-10-CM | POA: Diagnosis not present

## 2021-01-11 DIAGNOSIS — N186 End stage renal disease: Secondary | ICD-10-CM | POA: Diagnosis not present

## 2021-01-11 DIAGNOSIS — D631 Anemia in chronic kidney disease: Secondary | ICD-10-CM | POA: Diagnosis not present

## 2021-01-11 DIAGNOSIS — Z992 Dependence on renal dialysis: Secondary | ICD-10-CM | POA: Diagnosis not present

## 2021-01-11 DIAGNOSIS — N2581 Secondary hyperparathyroidism of renal origin: Secondary | ICD-10-CM | POA: Diagnosis not present

## 2021-01-16 DIAGNOSIS — I120 Hypertensive chronic kidney disease with stage 5 chronic kidney disease or end stage renal disease: Secondary | ICD-10-CM | POA: Diagnosis not present

## 2021-01-16 DIAGNOSIS — N2581 Secondary hyperparathyroidism of renal origin: Secondary | ICD-10-CM | POA: Diagnosis not present

## 2021-01-16 DIAGNOSIS — Z992 Dependence on renal dialysis: Secondary | ICD-10-CM | POA: Diagnosis not present

## 2021-01-16 DIAGNOSIS — D631 Anemia in chronic kidney disease: Secondary | ICD-10-CM | POA: Diagnosis not present

## 2021-01-16 DIAGNOSIS — D509 Iron deficiency anemia, unspecified: Secondary | ICD-10-CM | POA: Diagnosis not present

## 2021-01-16 DIAGNOSIS — N186 End stage renal disease: Secondary | ICD-10-CM | POA: Diagnosis not present

## 2021-01-17 DIAGNOSIS — H401113 Primary open-angle glaucoma, right eye, severe stage: Secondary | ICD-10-CM | POA: Diagnosis not present

## 2021-01-17 DIAGNOSIS — H2512 Age-related nuclear cataract, left eye: Secondary | ICD-10-CM | POA: Diagnosis not present

## 2021-01-17 DIAGNOSIS — Z961 Presence of intraocular lens: Secondary | ICD-10-CM | POA: Diagnosis not present

## 2021-01-18 ENCOUNTER — Telehealth: Payer: Self-pay

## 2021-01-18 DIAGNOSIS — N2581 Secondary hyperparathyroidism of renal origin: Secondary | ICD-10-CM | POA: Diagnosis not present

## 2021-01-18 DIAGNOSIS — N186 End stage renal disease: Secondary | ICD-10-CM | POA: Diagnosis not present

## 2021-01-18 DIAGNOSIS — D631 Anemia in chronic kidney disease: Secondary | ICD-10-CM | POA: Diagnosis not present

## 2021-01-18 DIAGNOSIS — D509 Iron deficiency anemia, unspecified: Secondary | ICD-10-CM | POA: Diagnosis not present

## 2021-01-18 DIAGNOSIS — Z992 Dependence on renal dialysis: Secondary | ICD-10-CM | POA: Diagnosis not present

## 2021-01-18 NOTE — Telephone Encounter (Signed)
Patient called to report HD will not access fistula due to shiny hard aneurysmal skin. Currently dialyzing through [facility]. Discussed with PA - potential need for AVF repair. Placed on [facility] schedule tomorrow for evaluation.

## 2021-01-19 ENCOUNTER — Other Ambulatory Visit: Payer: Self-pay

## 2021-01-19 ENCOUNTER — Ambulatory Visit (INDEPENDENT_AMBULATORY_CARE_PROVIDER_SITE_OTHER): Payer: Medicare Other | Admitting: Vascular Surgery

## 2021-01-19 ENCOUNTER — Encounter: Payer: Self-pay | Admitting: Vascular Surgery

## 2021-01-19 VITALS — BP 115/71 | HR 68 | Temp 98.3°F | Resp 20 | Ht 68.0 in | Wt 134.0 lb

## 2021-01-19 DIAGNOSIS — N186 End stage renal disease: Secondary | ICD-10-CM

## 2021-01-19 NOTE — Progress Notes (Signed)
    Subjective:     Patient ID: Timothy Ponce, male   DOB: 01-15-1956, 65 y.o.   MRN: [id number]  HPI 65 year old male status post revision plication of right arm AV fistula on December 22.  I then placed a TDC 2 days later after he had an infiltration event.  He was recently seen in our office doing very well with plan to cannulate the fistula.  He states that they were unable to cannulate yesterday TDC was used.  He has no complaints in his upper extremity all wounds are well-healed.   Review of Systems No complaints today    Objective:   Physical Exam Vitals:   01/19/21 1430  BP: 115/71  Pulse: 68  Resp: 20  Temp: 98.3 F (36.8 C)  SpO2: 95%   Awake alert oriented Right arm fistula has a thrill all wounds are healed There is shiny skin of the pseudoaneurysm this is not thinned there is no ulceration no evidence of bleeding    Assessment:     65 year old male currently dialyzing via catheter.  He has a right arm fistula that was recently revised all wounds are now healed.    Plan:     It is okay to cannulate both the arterial and venous needles in the fistula.  Thin skin does not appear to be of concern at this time.  He can follow-up with [place] on an as-needed basis peer     Kathleena Freeman C. Donzetta Matters, MD [facility] [employer] Office: [phone] Pager: [phone]

## 2021-01-20 DIAGNOSIS — N186 End stage renal disease: Secondary | ICD-10-CM | POA: Diagnosis not present

## 2021-01-20 DIAGNOSIS — Z992 Dependence on renal dialysis: Secondary | ICD-10-CM | POA: Diagnosis not present

## 2021-01-20 DIAGNOSIS — D631 Anemia in chronic kidney disease: Secondary | ICD-10-CM | POA: Diagnosis not present

## 2021-01-20 DIAGNOSIS — D509 Iron deficiency anemia, unspecified: Secondary | ICD-10-CM | POA: Diagnosis not present

## 2021-01-20 DIAGNOSIS — N2581 Secondary hyperparathyroidism of renal origin: Secondary | ICD-10-CM | POA: Diagnosis not present

## 2021-01-23 DIAGNOSIS — D509 Iron deficiency anemia, unspecified: Secondary | ICD-10-CM | POA: Diagnosis not present

## 2021-01-23 DIAGNOSIS — D631 Anemia in chronic kidney disease: Secondary | ICD-10-CM | POA: Diagnosis not present

## 2021-01-23 DIAGNOSIS — Z992 Dependence on renal dialysis: Secondary | ICD-10-CM | POA: Diagnosis not present

## 2021-01-23 DIAGNOSIS — N2581 Secondary hyperparathyroidism of renal origin: Secondary | ICD-10-CM | POA: Diagnosis not present

## 2021-01-23 DIAGNOSIS — N186 End stage renal disease: Secondary | ICD-10-CM | POA: Diagnosis not present

## 2021-01-24 ENCOUNTER — Ambulatory Visit: Payer: Medicare Other | Admitting: Podiatry

## 2021-01-25 DIAGNOSIS — Z992 Dependence on renal dialysis: Secondary | ICD-10-CM | POA: Diagnosis not present

## 2021-01-25 DIAGNOSIS — N2581 Secondary hyperparathyroidism of renal origin: Secondary | ICD-10-CM | POA: Diagnosis not present

## 2021-01-25 DIAGNOSIS — D631 Anemia in chronic kidney disease: Secondary | ICD-10-CM | POA: Diagnosis not present

## 2021-01-25 DIAGNOSIS — D509 Iron deficiency anemia, unspecified: Secondary | ICD-10-CM | POA: Diagnosis not present

## 2021-01-25 DIAGNOSIS — N186 End stage renal disease: Secondary | ICD-10-CM | POA: Diagnosis not present

## 2021-01-30 DIAGNOSIS — D631 Anemia in chronic kidney disease: Secondary | ICD-10-CM | POA: Diagnosis not present

## 2021-01-30 DIAGNOSIS — D509 Iron deficiency anemia, unspecified: Secondary | ICD-10-CM | POA: Diagnosis not present

## 2021-01-30 DIAGNOSIS — N2581 Secondary hyperparathyroidism of renal origin: Secondary | ICD-10-CM | POA: Diagnosis not present

## 2021-01-30 DIAGNOSIS — Z992 Dependence on renal dialysis: Secondary | ICD-10-CM | POA: Diagnosis not present

## 2021-01-30 DIAGNOSIS — N186 End stage renal disease: Secondary | ICD-10-CM | POA: Diagnosis not present

## 2021-02-01 DIAGNOSIS — Z992 Dependence on renal dialysis: Secondary | ICD-10-CM | POA: Diagnosis not present

## 2021-02-01 DIAGNOSIS — N186 End stage renal disease: Secondary | ICD-10-CM | POA: Diagnosis not present

## 2021-02-01 DIAGNOSIS — N2581 Secondary hyperparathyroidism of renal origin: Secondary | ICD-10-CM | POA: Diagnosis not present

## 2021-02-01 DIAGNOSIS — D509 Iron deficiency anemia, unspecified: Secondary | ICD-10-CM | POA: Diagnosis not present

## 2021-02-01 DIAGNOSIS — D631 Anemia in chronic kidney disease: Secondary | ICD-10-CM | POA: Diagnosis not present

## 2021-02-03 DIAGNOSIS — D509 Iron deficiency anemia, unspecified: Secondary | ICD-10-CM | POA: Diagnosis not present

## 2021-02-03 DIAGNOSIS — D631 Anemia in chronic kidney disease: Secondary | ICD-10-CM | POA: Diagnosis not present

## 2021-02-03 DIAGNOSIS — N186 End stage renal disease: Secondary | ICD-10-CM | POA: Diagnosis not present

## 2021-02-03 DIAGNOSIS — Z992 Dependence on renal dialysis: Secondary | ICD-10-CM | POA: Diagnosis not present

## 2021-02-03 DIAGNOSIS — N2581 Secondary hyperparathyroidism of renal origin: Secondary | ICD-10-CM | POA: Diagnosis not present

## 2021-02-06 DIAGNOSIS — D509 Iron deficiency anemia, unspecified: Secondary | ICD-10-CM | POA: Diagnosis not present

## 2021-02-06 DIAGNOSIS — N186 End stage renal disease: Secondary | ICD-10-CM | POA: Diagnosis not present

## 2021-02-06 DIAGNOSIS — N2581 Secondary hyperparathyroidism of renal origin: Secondary | ICD-10-CM | POA: Diagnosis not present

## 2021-02-06 DIAGNOSIS — Z992 Dependence on renal dialysis: Secondary | ICD-10-CM | POA: Diagnosis not present

## 2021-02-06 DIAGNOSIS — D631 Anemia in chronic kidney disease: Secondary | ICD-10-CM | POA: Diagnosis not present

## 2021-02-08 DIAGNOSIS — N186 End stage renal disease: Secondary | ICD-10-CM | POA: Diagnosis not present

## 2021-02-08 DIAGNOSIS — D509 Iron deficiency anemia, unspecified: Secondary | ICD-10-CM | POA: Diagnosis not present

## 2021-02-08 DIAGNOSIS — N2581 Secondary hyperparathyroidism of renal origin: Secondary | ICD-10-CM | POA: Diagnosis not present

## 2021-02-08 DIAGNOSIS — Z992 Dependence on renal dialysis: Secondary | ICD-10-CM | POA: Diagnosis not present

## 2021-02-08 DIAGNOSIS — D631 Anemia in chronic kidney disease: Secondary | ICD-10-CM | POA: Diagnosis not present

## 2021-02-10 DIAGNOSIS — N2581 Secondary hyperparathyroidism of renal origin: Secondary | ICD-10-CM | POA: Diagnosis not present

## 2021-02-10 DIAGNOSIS — D509 Iron deficiency anemia, unspecified: Secondary | ICD-10-CM | POA: Diagnosis not present

## 2021-02-10 DIAGNOSIS — D631 Anemia in chronic kidney disease: Secondary | ICD-10-CM | POA: Diagnosis not present

## 2021-02-10 DIAGNOSIS — N186 End stage renal disease: Secondary | ICD-10-CM | POA: Diagnosis not present

## 2021-02-10 DIAGNOSIS — Z992 Dependence on renal dialysis: Secondary | ICD-10-CM | POA: Diagnosis not present

## 2021-02-13 DIAGNOSIS — N186 End stage renal disease: Secondary | ICD-10-CM | POA: Diagnosis not present

## 2021-02-13 DIAGNOSIS — D509 Iron deficiency anemia, unspecified: Secondary | ICD-10-CM | POA: Diagnosis not present

## 2021-02-13 DIAGNOSIS — D631 Anemia in chronic kidney disease: Secondary | ICD-10-CM | POA: Diagnosis not present

## 2021-02-13 DIAGNOSIS — N2581 Secondary hyperparathyroidism of renal origin: Secondary | ICD-10-CM | POA: Diagnosis not present

## 2021-02-13 DIAGNOSIS — I120 Hypertensive chronic kidney disease with stage 5 chronic kidney disease or end stage renal disease: Secondary | ICD-10-CM | POA: Diagnosis not present

## 2021-02-13 DIAGNOSIS — Z992 Dependence on renal dialysis: Secondary | ICD-10-CM | POA: Diagnosis not present

## 2021-02-15 DIAGNOSIS — Z992 Dependence on renal dialysis: Secondary | ICD-10-CM | POA: Diagnosis not present

## 2021-02-15 DIAGNOSIS — D509 Iron deficiency anemia, unspecified: Secondary | ICD-10-CM | POA: Diagnosis not present

## 2021-02-15 DIAGNOSIS — N2581 Secondary hyperparathyroidism of renal origin: Secondary | ICD-10-CM | POA: Diagnosis not present

## 2021-02-15 DIAGNOSIS — N186 End stage renal disease: Secondary | ICD-10-CM | POA: Diagnosis not present

## 2021-02-15 DIAGNOSIS — D631 Anemia in chronic kidney disease: Secondary | ICD-10-CM | POA: Diagnosis not present

## 2021-02-17 DIAGNOSIS — D631 Anemia in chronic kidney disease: Secondary | ICD-10-CM | POA: Diagnosis not present

## 2021-02-17 DIAGNOSIS — Z992 Dependence on renal dialysis: Secondary | ICD-10-CM | POA: Diagnosis not present

## 2021-02-17 DIAGNOSIS — N186 End stage renal disease: Secondary | ICD-10-CM | POA: Diagnosis not present

## 2021-02-17 DIAGNOSIS — N2581 Secondary hyperparathyroidism of renal origin: Secondary | ICD-10-CM | POA: Diagnosis not present

## 2021-02-17 DIAGNOSIS — D509 Iron deficiency anemia, unspecified: Secondary | ICD-10-CM | POA: Diagnosis not present

## 2021-02-20 DIAGNOSIS — N186 End stage renal disease: Secondary | ICD-10-CM | POA: Diagnosis not present

## 2021-02-20 DIAGNOSIS — Z992 Dependence on renal dialysis: Secondary | ICD-10-CM | POA: Diagnosis not present

## 2021-02-20 DIAGNOSIS — N2581 Secondary hyperparathyroidism of renal origin: Secondary | ICD-10-CM | POA: Diagnosis not present

## 2021-02-20 DIAGNOSIS — D631 Anemia in chronic kidney disease: Secondary | ICD-10-CM | POA: Diagnosis not present

## 2021-02-20 DIAGNOSIS — D509 Iron deficiency anemia, unspecified: Secondary | ICD-10-CM | POA: Diagnosis not present

## 2021-02-22 ENCOUNTER — Other Ambulatory Visit: Payer: Self-pay

## 2021-02-22 ENCOUNTER — Ambulatory Visit (INDEPENDENT_AMBULATORY_CARE_PROVIDER_SITE_OTHER): Payer: Medicare Other | Admitting: Podiatry

## 2021-02-22 DIAGNOSIS — M79674 Pain in right toe(s): Secondary | ICD-10-CM

## 2021-02-22 DIAGNOSIS — M79675 Pain in left toe(s): Secondary | ICD-10-CM | POA: Diagnosis not present

## 2021-02-22 DIAGNOSIS — B351 Tinea unguium: Secondary | ICD-10-CM | POA: Diagnosis not present

## 2021-02-22 DIAGNOSIS — Z992 Dependence on renal dialysis: Secondary | ICD-10-CM | POA: Diagnosis not present

## 2021-02-22 DIAGNOSIS — D509 Iron deficiency anemia, unspecified: Secondary | ICD-10-CM | POA: Diagnosis not present

## 2021-02-22 DIAGNOSIS — D631 Anemia in chronic kidney disease: Secondary | ICD-10-CM | POA: Diagnosis not present

## 2021-02-22 DIAGNOSIS — I999 Unspecified disorder of circulatory system: Secondary | ICD-10-CM | POA: Diagnosis not present

## 2021-02-22 DIAGNOSIS — N2581 Secondary hyperparathyroidism of renal origin: Secondary | ICD-10-CM | POA: Diagnosis not present

## 2021-02-22 DIAGNOSIS — N186 End stage renal disease: Secondary | ICD-10-CM | POA: Diagnosis not present

## 2021-02-22 NOTE — Progress Notes (Signed)
Subjective:   Patient ID: Timothy Ponce, male   DOB: 65 y.o.   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  Poor health long-term history diabetes and dialysis.  Patient does not smoke and is not active   Review of Systems  All other systems reviewed and are negative.       Objective:  Physical Exam Vitals and nursing note reviewed.  Constitutional:      Appearance: He is well-developed.  Pulmonary:     Effort: Pulmonary effort is normal.  Musculoskeletal:        General: Normal range of motion.  Skin:    General: Skin is warm.  Neurological:     Mental Status: He is alert.     Vascular status mildly diminished both PT DP pulses with very dry skin thick yellow brittle nailbeds 1-5 both feet that are painful except for the fifth nail right which was removed with the digit.  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

## 2021-02-23 DIAGNOSIS — Z452 Encounter for adjustment and management of vascular access device: Secondary | ICD-10-CM | POA: Diagnosis not present

## 2021-02-23 DIAGNOSIS — N186 End stage renal disease: Secondary | ICD-10-CM | POA: Diagnosis not present

## 2021-02-23 DIAGNOSIS — Z992 Dependence on renal dialysis: Secondary | ICD-10-CM | POA: Diagnosis not present

## 2021-02-27 DIAGNOSIS — D509 Iron deficiency anemia, unspecified: Secondary | ICD-10-CM | POA: Diagnosis not present

## 2021-02-27 DIAGNOSIS — N186 End stage renal disease: Secondary | ICD-10-CM | POA: Diagnosis not present

## 2021-02-27 DIAGNOSIS — Z992 Dependence on renal dialysis: Secondary | ICD-10-CM | POA: Diagnosis not present

## 2021-02-27 DIAGNOSIS — N2581 Secondary hyperparathyroidism of renal origin: Secondary | ICD-10-CM | POA: Diagnosis not present

## 2021-02-27 DIAGNOSIS — D631 Anemia in chronic kidney disease: Secondary | ICD-10-CM | POA: Diagnosis not present

## 2021-03-01 DIAGNOSIS — Z992 Dependence on renal dialysis: Secondary | ICD-10-CM | POA: Diagnosis not present

## 2021-03-01 DIAGNOSIS — N2581 Secondary hyperparathyroidism of renal origin: Secondary | ICD-10-CM | POA: Diagnosis not present

## 2021-03-01 DIAGNOSIS — D631 Anemia in chronic kidney disease: Secondary | ICD-10-CM | POA: Diagnosis not present

## 2021-03-01 DIAGNOSIS — D509 Iron deficiency anemia, unspecified: Secondary | ICD-10-CM | POA: Diagnosis not present

## 2021-03-01 DIAGNOSIS — N186 End stage renal disease: Secondary | ICD-10-CM | POA: Diagnosis not present

## 2021-03-03 DIAGNOSIS — N186 End stage renal disease: Secondary | ICD-10-CM | POA: Diagnosis not present

## 2021-03-03 DIAGNOSIS — N2581 Secondary hyperparathyroidism of renal origin: Secondary | ICD-10-CM | POA: Diagnosis not present

## 2021-03-03 DIAGNOSIS — Z992 Dependence on renal dialysis: Secondary | ICD-10-CM | POA: Diagnosis not present

## 2021-03-03 DIAGNOSIS — D631 Anemia in chronic kidney disease: Secondary | ICD-10-CM | POA: Diagnosis not present

## 2021-03-03 DIAGNOSIS — D509 Iron deficiency anemia, unspecified: Secondary | ICD-10-CM | POA: Diagnosis not present

## 2021-03-06 DIAGNOSIS — D631 Anemia in chronic kidney disease: Secondary | ICD-10-CM | POA: Diagnosis not present

## 2021-03-06 DIAGNOSIS — Z992 Dependence on renal dialysis: Secondary | ICD-10-CM | POA: Diagnosis not present

## 2021-03-06 DIAGNOSIS — D509 Iron deficiency anemia, unspecified: Secondary | ICD-10-CM | POA: Diagnosis not present

## 2021-03-06 DIAGNOSIS — N2581 Secondary hyperparathyroidism of renal origin: Secondary | ICD-10-CM | POA: Diagnosis not present

## 2021-03-06 DIAGNOSIS — N186 End stage renal disease: Secondary | ICD-10-CM | POA: Diagnosis not present

## 2021-03-08 DIAGNOSIS — N2581 Secondary hyperparathyroidism of renal origin: Secondary | ICD-10-CM | POA: Diagnosis not present

## 2021-03-08 DIAGNOSIS — D509 Iron deficiency anemia, unspecified: Secondary | ICD-10-CM | POA: Diagnosis not present

## 2021-03-08 DIAGNOSIS — N186 End stage renal disease: Secondary | ICD-10-CM | POA: Diagnosis not present

## 2021-03-08 DIAGNOSIS — Z992 Dependence on renal dialysis: Secondary | ICD-10-CM | POA: Diagnosis not present

## 2021-03-08 DIAGNOSIS — D631 Anemia in chronic kidney disease: Secondary | ICD-10-CM | POA: Diagnosis not present

## 2021-03-13 DIAGNOSIS — D509 Iron deficiency anemia, unspecified: Secondary | ICD-10-CM | POA: Diagnosis not present

## 2021-03-13 DIAGNOSIS — N2581 Secondary hyperparathyroidism of renal origin: Secondary | ICD-10-CM | POA: Diagnosis not present

## 2021-03-13 DIAGNOSIS — D631 Anemia in chronic kidney disease: Secondary | ICD-10-CM | POA: Diagnosis not present

## 2021-03-13 DIAGNOSIS — Z992 Dependence on renal dialysis: Secondary | ICD-10-CM | POA: Diagnosis not present

## 2021-03-13 DIAGNOSIS — N186 End stage renal disease: Secondary | ICD-10-CM | POA: Diagnosis not present

## 2021-03-15 DIAGNOSIS — N2581 Secondary hyperparathyroidism of renal origin: Secondary | ICD-10-CM | POA: Diagnosis not present

## 2021-03-15 DIAGNOSIS — Z992 Dependence on renal dialysis: Secondary | ICD-10-CM | POA: Diagnosis not present

## 2021-03-15 DIAGNOSIS — D509 Iron deficiency anemia, unspecified: Secondary | ICD-10-CM | POA: Diagnosis not present

## 2021-03-15 DIAGNOSIS — D631 Anemia in chronic kidney disease: Secondary | ICD-10-CM | POA: Diagnosis not present

## 2021-03-15 DIAGNOSIS — N186 End stage renal disease: Secondary | ICD-10-CM | POA: Diagnosis not present

## 2021-03-16 DIAGNOSIS — N186 End stage renal disease: Secondary | ICD-10-CM | POA: Diagnosis not present

## 2021-03-16 DIAGNOSIS — I120 Hypertensive chronic kidney disease with stage 5 chronic kidney disease or end stage renal disease: Secondary | ICD-10-CM | POA: Diagnosis not present

## 2021-03-16 DIAGNOSIS — Z992 Dependence on renal dialysis: Secondary | ICD-10-CM | POA: Diagnosis not present

## 2021-03-17 DIAGNOSIS — D631 Anemia in chronic kidney disease: Secondary | ICD-10-CM | POA: Diagnosis not present

## 2021-03-17 DIAGNOSIS — Z992 Dependence on renal dialysis: Secondary | ICD-10-CM | POA: Diagnosis not present

## 2021-03-17 DIAGNOSIS — N2581 Secondary hyperparathyroidism of renal origin: Secondary | ICD-10-CM | POA: Diagnosis not present

## 2021-03-17 DIAGNOSIS — N186 End stage renal disease: Secondary | ICD-10-CM | POA: Diagnosis not present

## 2021-03-17 DIAGNOSIS — D509 Iron deficiency anemia, unspecified: Secondary | ICD-10-CM | POA: Diagnosis not present

## 2021-03-20 DIAGNOSIS — D631 Anemia in chronic kidney disease: Secondary | ICD-10-CM | POA: Diagnosis not present

## 2021-03-20 DIAGNOSIS — D509 Iron deficiency anemia, unspecified: Secondary | ICD-10-CM | POA: Diagnosis not present

## 2021-03-20 DIAGNOSIS — Z992 Dependence on renal dialysis: Secondary | ICD-10-CM | POA: Diagnosis not present

## 2021-03-20 DIAGNOSIS — N186 End stage renal disease: Secondary | ICD-10-CM | POA: Diagnosis not present

## 2021-03-20 DIAGNOSIS — N2581 Secondary hyperparathyroidism of renal origin: Secondary | ICD-10-CM | POA: Diagnosis not present

## 2021-03-22 DIAGNOSIS — Z992 Dependence on renal dialysis: Secondary | ICD-10-CM | POA: Diagnosis not present

## 2021-03-22 DIAGNOSIS — D509 Iron deficiency anemia, unspecified: Secondary | ICD-10-CM | POA: Diagnosis not present

## 2021-03-22 DIAGNOSIS — N186 End stage renal disease: Secondary | ICD-10-CM | POA: Diagnosis not present

## 2021-03-22 DIAGNOSIS — D631 Anemia in chronic kidney disease: Secondary | ICD-10-CM | POA: Diagnosis not present

## 2021-03-22 DIAGNOSIS — N2581 Secondary hyperparathyroidism of renal origin: Secondary | ICD-10-CM | POA: Diagnosis not present

## 2021-03-24 DIAGNOSIS — Z992 Dependence on renal dialysis: Secondary | ICD-10-CM | POA: Diagnosis not present

## 2021-03-24 DIAGNOSIS — N2581 Secondary hyperparathyroidism of renal origin: Secondary | ICD-10-CM | POA: Diagnosis not present

## 2021-03-24 DIAGNOSIS — N186 End stage renal disease: Secondary | ICD-10-CM | POA: Diagnosis not present

## 2021-03-24 DIAGNOSIS — D509 Iron deficiency anemia, unspecified: Secondary | ICD-10-CM | POA: Diagnosis not present

## 2021-03-24 DIAGNOSIS — D631 Anemia in chronic kidney disease: Secondary | ICD-10-CM | POA: Diagnosis not present

## 2021-03-27 DIAGNOSIS — N186 End stage renal disease: Secondary | ICD-10-CM | POA: Diagnosis not present

## 2021-03-27 DIAGNOSIS — N2581 Secondary hyperparathyroidism of renal origin: Secondary | ICD-10-CM | POA: Diagnosis not present

## 2021-03-27 DIAGNOSIS — D631 Anemia in chronic kidney disease: Secondary | ICD-10-CM | POA: Diagnosis not present

## 2021-03-27 DIAGNOSIS — D509 Iron deficiency anemia, unspecified: Secondary | ICD-10-CM | POA: Diagnosis not present

## 2021-03-27 DIAGNOSIS — Z992 Dependence on renal dialysis: Secondary | ICD-10-CM | POA: Diagnosis not present

## 2021-03-29 DIAGNOSIS — N2581 Secondary hyperparathyroidism of renal origin: Secondary | ICD-10-CM | POA: Diagnosis not present

## 2021-03-29 DIAGNOSIS — D631 Anemia in chronic kidney disease: Secondary | ICD-10-CM | POA: Diagnosis not present

## 2021-03-29 DIAGNOSIS — Z992 Dependence on renal dialysis: Secondary | ICD-10-CM | POA: Diagnosis not present

## 2021-03-29 DIAGNOSIS — D509 Iron deficiency anemia, unspecified: Secondary | ICD-10-CM | POA: Diagnosis not present

## 2021-03-29 DIAGNOSIS — N186 End stage renal disease: Secondary | ICD-10-CM | POA: Diagnosis not present

## 2021-04-03 DIAGNOSIS — D631 Anemia in chronic kidney disease: Secondary | ICD-10-CM | POA: Diagnosis not present

## 2021-04-03 DIAGNOSIS — D509 Iron deficiency anemia, unspecified: Secondary | ICD-10-CM | POA: Diagnosis not present

## 2021-04-03 DIAGNOSIS — N2581 Secondary hyperparathyroidism of renal origin: Secondary | ICD-10-CM | POA: Diagnosis not present

## 2021-04-03 DIAGNOSIS — Z992 Dependence on renal dialysis: Secondary | ICD-10-CM | POA: Diagnosis not present

## 2021-04-03 DIAGNOSIS — N186 End stage renal disease: Secondary | ICD-10-CM | POA: Diagnosis not present

## 2021-04-05 DIAGNOSIS — N186 End stage renal disease: Secondary | ICD-10-CM | POA: Diagnosis not present

## 2021-04-05 DIAGNOSIS — D509 Iron deficiency anemia, unspecified: Secondary | ICD-10-CM | POA: Diagnosis not present

## 2021-04-05 DIAGNOSIS — D631 Anemia in chronic kidney disease: Secondary | ICD-10-CM | POA: Diagnosis not present

## 2021-04-05 DIAGNOSIS — N2581 Secondary hyperparathyroidism of renal origin: Secondary | ICD-10-CM | POA: Diagnosis not present

## 2021-04-05 DIAGNOSIS — Z992 Dependence on renal dialysis: Secondary | ICD-10-CM | POA: Diagnosis not present

## 2021-04-07 DIAGNOSIS — N186 End stage renal disease: Secondary | ICD-10-CM | POA: Diagnosis not present

## 2021-04-07 DIAGNOSIS — N2581 Secondary hyperparathyroidism of renal origin: Secondary | ICD-10-CM | POA: Diagnosis not present

## 2021-04-07 DIAGNOSIS — D509 Iron deficiency anemia, unspecified: Secondary | ICD-10-CM | POA: Diagnosis not present

## 2021-04-07 DIAGNOSIS — Z992 Dependence on renal dialysis: Secondary | ICD-10-CM | POA: Diagnosis not present

## 2021-04-07 DIAGNOSIS — D631 Anemia in chronic kidney disease: Secondary | ICD-10-CM | POA: Diagnosis not present

## 2021-04-12 DIAGNOSIS — D509 Iron deficiency anemia, unspecified: Secondary | ICD-10-CM | POA: Diagnosis not present

## 2021-04-12 DIAGNOSIS — N2581 Secondary hyperparathyroidism of renal origin: Secondary | ICD-10-CM | POA: Diagnosis not present

## 2021-04-12 DIAGNOSIS — D631 Anemia in chronic kidney disease: Secondary | ICD-10-CM | POA: Diagnosis not present

## 2021-04-12 DIAGNOSIS — N186 End stage renal disease: Secondary | ICD-10-CM | POA: Diagnosis not present

## 2021-04-12 DIAGNOSIS — Z992 Dependence on renal dialysis: Secondary | ICD-10-CM | POA: Diagnosis not present

## 2021-04-15 DIAGNOSIS — N186 End stage renal disease: Secondary | ICD-10-CM | POA: Diagnosis not present

## 2021-04-15 DIAGNOSIS — I120 Hypertensive chronic kidney disease with stage 5 chronic kidney disease or end stage renal disease: Secondary | ICD-10-CM | POA: Diagnosis not present

## 2021-04-15 DIAGNOSIS — Z992 Dependence on renal dialysis: Secondary | ICD-10-CM | POA: Diagnosis not present

## 2021-04-17 DIAGNOSIS — Z992 Dependence on renal dialysis: Secondary | ICD-10-CM | POA: Diagnosis not present

## 2021-04-17 DIAGNOSIS — N2581 Secondary hyperparathyroidism of renal origin: Secondary | ICD-10-CM | POA: Diagnosis not present

## 2021-04-17 DIAGNOSIS — N186 End stage renal disease: Secondary | ICD-10-CM | POA: Diagnosis not present

## 2021-04-17 DIAGNOSIS — D689 Coagulation defect, unspecified: Secondary | ICD-10-CM | POA: Diagnosis not present

## 2021-04-17 DIAGNOSIS — D631 Anemia in chronic kidney disease: Secondary | ICD-10-CM | POA: Diagnosis not present

## 2021-04-17 DIAGNOSIS — D509 Iron deficiency anemia, unspecified: Secondary | ICD-10-CM | POA: Diagnosis not present

## 2021-04-19 DIAGNOSIS — N186 End stage renal disease: Secondary | ICD-10-CM | POA: Diagnosis not present

## 2021-04-19 DIAGNOSIS — D631 Anemia in chronic kidney disease: Secondary | ICD-10-CM | POA: Diagnosis not present

## 2021-04-19 DIAGNOSIS — D689 Coagulation defect, unspecified: Secondary | ICD-10-CM | POA: Diagnosis not present

## 2021-04-19 DIAGNOSIS — Z992 Dependence on renal dialysis: Secondary | ICD-10-CM | POA: Diagnosis not present

## 2021-04-19 DIAGNOSIS — N2581 Secondary hyperparathyroidism of renal origin: Secondary | ICD-10-CM | POA: Diagnosis not present

## 2021-04-19 DIAGNOSIS — D509 Iron deficiency anemia, unspecified: Secondary | ICD-10-CM | POA: Diagnosis not present

## 2021-04-21 DIAGNOSIS — Z992 Dependence on renal dialysis: Secondary | ICD-10-CM | POA: Diagnosis not present

## 2021-04-21 DIAGNOSIS — D509 Iron deficiency anemia, unspecified: Secondary | ICD-10-CM | POA: Diagnosis not present

## 2021-04-21 DIAGNOSIS — N2581 Secondary hyperparathyroidism of renal origin: Secondary | ICD-10-CM | POA: Diagnosis not present

## 2021-04-21 DIAGNOSIS — D631 Anemia in chronic kidney disease: Secondary | ICD-10-CM | POA: Diagnosis not present

## 2021-04-21 DIAGNOSIS — D689 Coagulation defect, unspecified: Secondary | ICD-10-CM | POA: Diagnosis not present

## 2021-04-21 DIAGNOSIS — N186 End stage renal disease: Secondary | ICD-10-CM | POA: Diagnosis not present

## 2021-04-24 DIAGNOSIS — N186 End stage renal disease: Secondary | ICD-10-CM | POA: Diagnosis not present

## 2021-04-24 DIAGNOSIS — D631 Anemia in chronic kidney disease: Secondary | ICD-10-CM | POA: Diagnosis not present

## 2021-04-24 DIAGNOSIS — N2581 Secondary hyperparathyroidism of renal origin: Secondary | ICD-10-CM | POA: Diagnosis not present

## 2021-04-24 DIAGNOSIS — D509 Iron deficiency anemia, unspecified: Secondary | ICD-10-CM | POA: Diagnosis not present

## 2021-04-24 DIAGNOSIS — D689 Coagulation defect, unspecified: Secondary | ICD-10-CM | POA: Diagnosis not present

## 2021-04-24 DIAGNOSIS — Z992 Dependence on renal dialysis: Secondary | ICD-10-CM | POA: Diagnosis not present

## 2021-04-26 DIAGNOSIS — D631 Anemia in chronic kidney disease: Secondary | ICD-10-CM | POA: Diagnosis not present

## 2021-04-26 DIAGNOSIS — N2581 Secondary hyperparathyroidism of renal origin: Secondary | ICD-10-CM | POA: Diagnosis not present

## 2021-04-26 DIAGNOSIS — D509 Iron deficiency anemia, unspecified: Secondary | ICD-10-CM | POA: Diagnosis not present

## 2021-04-26 DIAGNOSIS — Z992 Dependence on renal dialysis: Secondary | ICD-10-CM | POA: Diagnosis not present

## 2021-04-26 DIAGNOSIS — D689 Coagulation defect, unspecified: Secondary | ICD-10-CM | POA: Diagnosis not present

## 2021-04-26 DIAGNOSIS — N186 End stage renal disease: Secondary | ICD-10-CM | POA: Diagnosis not present

## 2021-04-28 DIAGNOSIS — N186 End stage renal disease: Secondary | ICD-10-CM | POA: Diagnosis not present

## 2021-04-28 DIAGNOSIS — D631 Anemia in chronic kidney disease: Secondary | ICD-10-CM | POA: Diagnosis not present

## 2021-04-28 DIAGNOSIS — Z992 Dependence on renal dialysis: Secondary | ICD-10-CM | POA: Diagnosis not present

## 2021-04-28 DIAGNOSIS — D689 Coagulation defect, unspecified: Secondary | ICD-10-CM | POA: Diagnosis not present

## 2021-04-28 DIAGNOSIS — D509 Iron deficiency anemia, unspecified: Secondary | ICD-10-CM | POA: Diagnosis not present

## 2021-04-28 DIAGNOSIS — N2581 Secondary hyperparathyroidism of renal origin: Secondary | ICD-10-CM | POA: Diagnosis not present

## 2021-05-01 DIAGNOSIS — N186 End stage renal disease: Secondary | ICD-10-CM | POA: Diagnosis not present

## 2021-05-01 DIAGNOSIS — N2581 Secondary hyperparathyroidism of renal origin: Secondary | ICD-10-CM | POA: Diagnosis not present

## 2021-05-01 DIAGNOSIS — Z992 Dependence on renal dialysis: Secondary | ICD-10-CM | POA: Diagnosis not present

## 2021-05-01 DIAGNOSIS — D631 Anemia in chronic kidney disease: Secondary | ICD-10-CM | POA: Diagnosis not present

## 2021-05-01 DIAGNOSIS — D509 Iron deficiency anemia, unspecified: Secondary | ICD-10-CM | POA: Diagnosis not present

## 2021-05-01 DIAGNOSIS — D689 Coagulation defect, unspecified: Secondary | ICD-10-CM | POA: Diagnosis not present

## 2021-05-02 DIAGNOSIS — H2512 Age-related nuclear cataract, left eye: Secondary | ICD-10-CM | POA: Diagnosis not present

## 2021-05-02 DIAGNOSIS — Z961 Presence of intraocular lens: Secondary | ICD-10-CM | POA: Diagnosis not present

## 2021-05-02 DIAGNOSIS — H401133 Primary open-angle glaucoma, bilateral, severe stage: Secondary | ICD-10-CM | POA: Diagnosis not present

## 2021-05-03 DIAGNOSIS — N2581 Secondary hyperparathyroidism of renal origin: Secondary | ICD-10-CM | POA: Diagnosis not present

## 2021-05-03 DIAGNOSIS — N186 End stage renal disease: Secondary | ICD-10-CM | POA: Diagnosis not present

## 2021-05-03 DIAGNOSIS — Z992 Dependence on renal dialysis: Secondary | ICD-10-CM | POA: Diagnosis not present

## 2021-05-03 DIAGNOSIS — D631 Anemia in chronic kidney disease: Secondary | ICD-10-CM | POA: Diagnosis not present

## 2021-05-03 DIAGNOSIS — D689 Coagulation defect, unspecified: Secondary | ICD-10-CM | POA: Diagnosis not present

## 2021-05-03 DIAGNOSIS — D509 Iron deficiency anemia, unspecified: Secondary | ICD-10-CM | POA: Diagnosis not present

## 2021-05-03 IMAGING — MR MR FOOT*R* W/O CM
7 series · 40 of 40 positions shown · non-contrast
Comparison: None.

CLINICAL DATA: Right foot wound

EXAM:
MRI OF THE RIGHT FOREFOOT WITHOUT CONTRAST
TECHNIQUE: Multiplanar, multisequence MR imaging of the right was performed. No
intravenous contrast was administered.

[Series 4: T1 · coronal · right · 3.0mm · 0.47mm/px · 6 of 44 slices shown (1 of 2)]
[im 1/44]
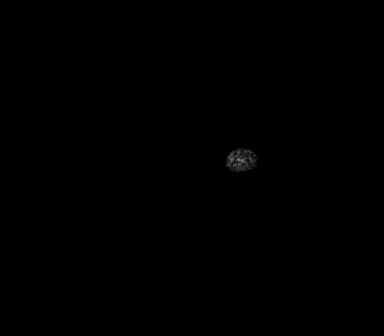
[im 9/44]
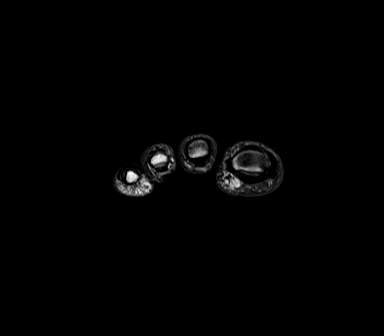
[im 18/44]
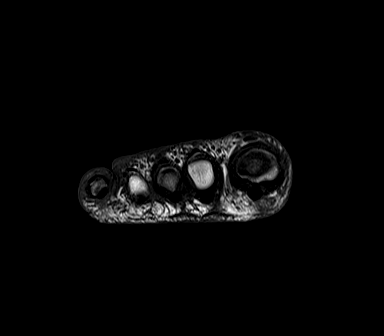
[im 26/44]
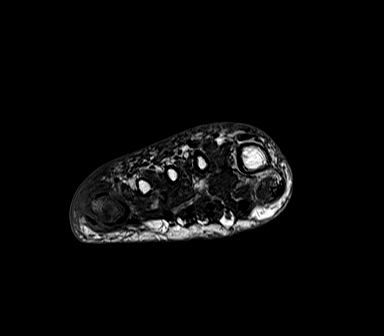
[im 35/44]
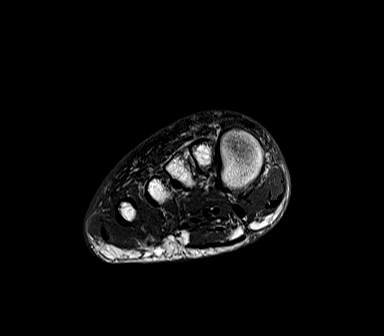
[im 44/44]
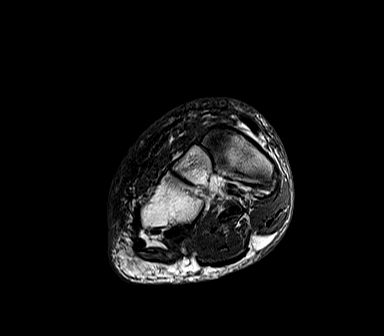

[Series 5: T2 fat-sat · coronal · right · 3.0mm · 0.49mm/px · 6 of 44 slices shown (1 of 3)]
[im 1/44]
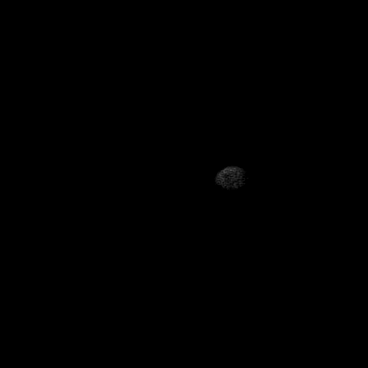
[im 9/44]
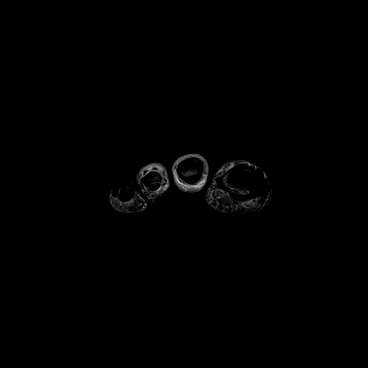
[im 18/44]
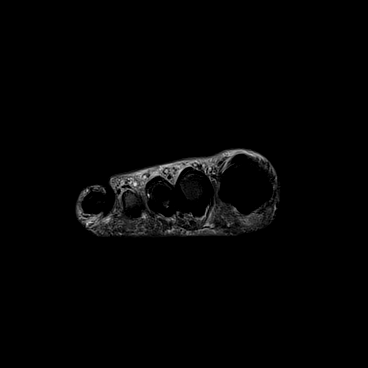
[im 26/44]
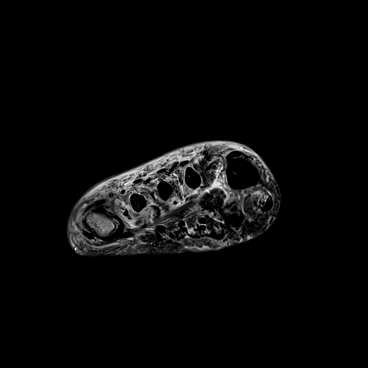
[im 35/44]
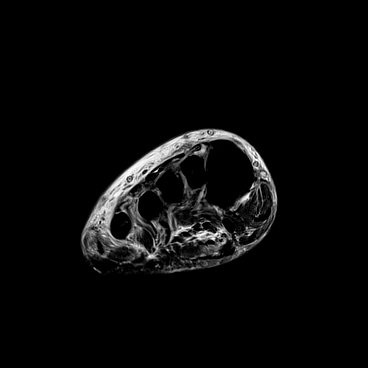
[im 44/44]
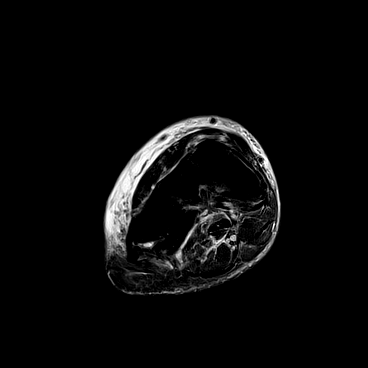

[Series 6: T1 · axial · right · 3.0mm · 0.52mm/px · z∈[-133,+35]mm · 6 of 48 slices shown (2 of 2)]
[im 1/48]
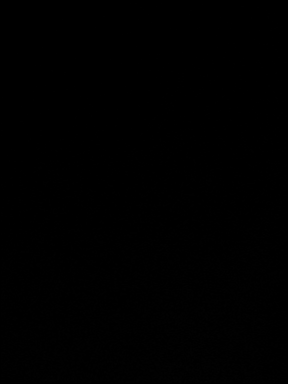
[im 10/48]
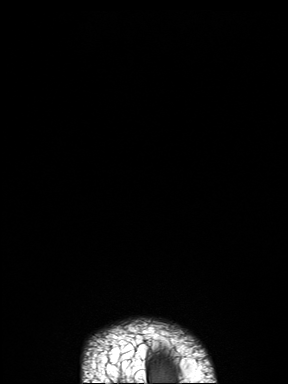
[im 19/48]
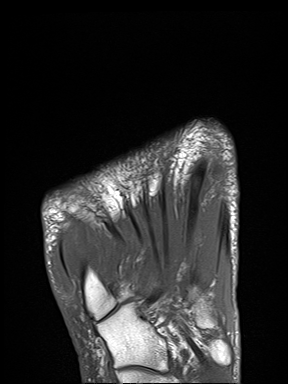
[im 29/48]
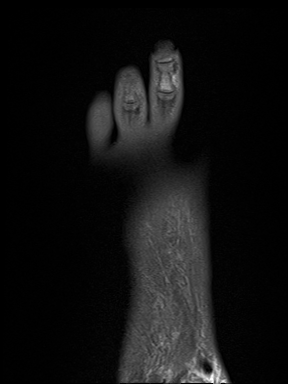
[im 38/48]
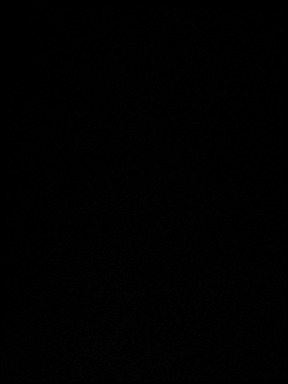
[im 48/48]
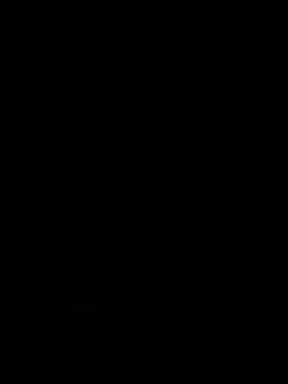

[Series 7: T2 fat-sat · axial · right · 3.0mm · 0.54mm/px · z∈[-132,+36]mm · 6 of 48 slices shown (2 of 3)]
[im 1/48]
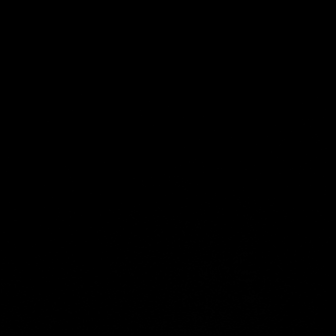
[im 10/48]
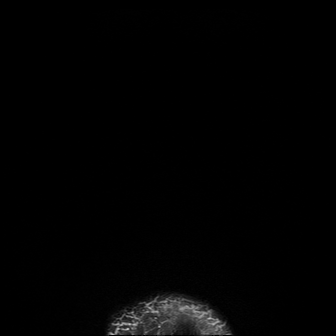
[im 19/48]
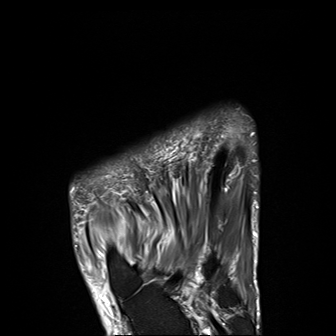
[im 29/48]
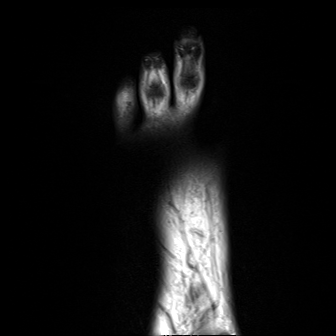
[im 38/48]
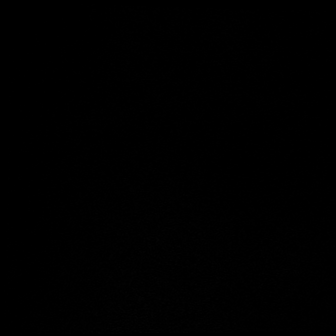
[im 48/48]
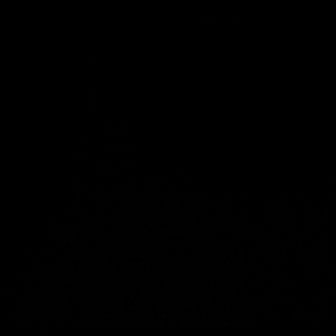

[Series 8: STIR · sagittal · right · 3.0mm · 0.70mm/px · 5 of 41 slices shown (1 of 2)]
[im 1/41]
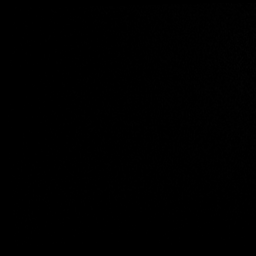
[im 11/41]
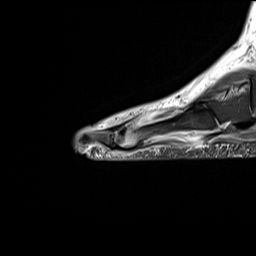
[im 21/41]
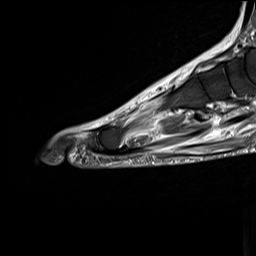
[im 31/41]
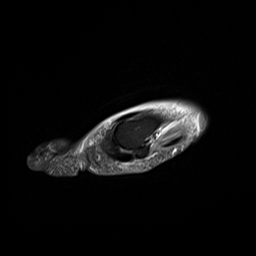
[im 41/41]
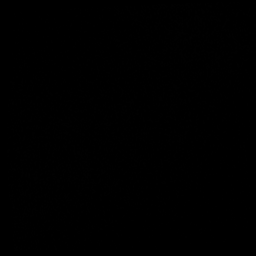

[Series 9: STIR · sagittal · right · 3.0mm · 0.70mm/px · 5 of 37 slices shown (2 of 2)]
[im 1/37]
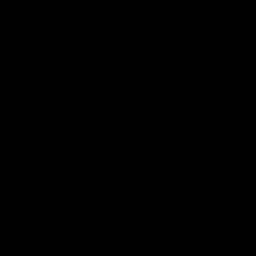
[im 10/37]
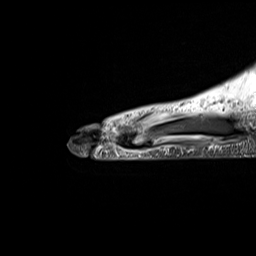
[im 19/37]
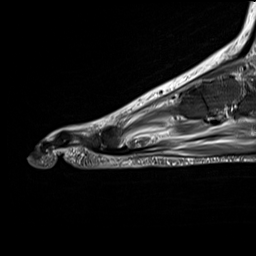
[im 28/37]
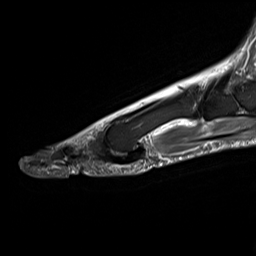
[im 37/37]
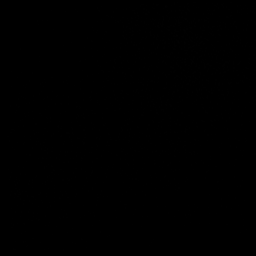

[Series 10: T2 fat-sat · axial · right · 3.0mm · 0.54mm/px · z∈[-110,+54]mm · 6 of 48 slices shown (3 of 3)]
[im 1/48]
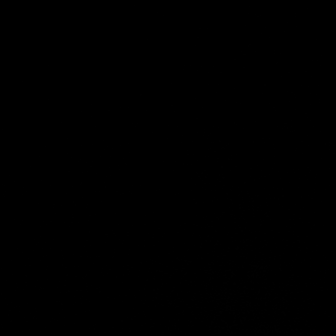
[im 10/48]
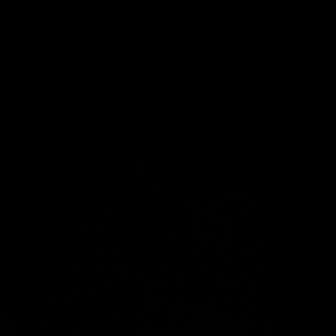
[im 19/48]
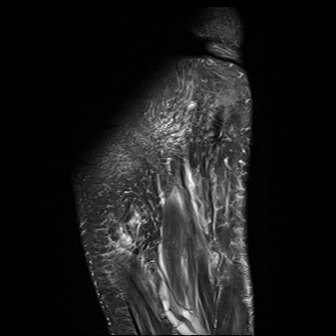
[im 29/48]
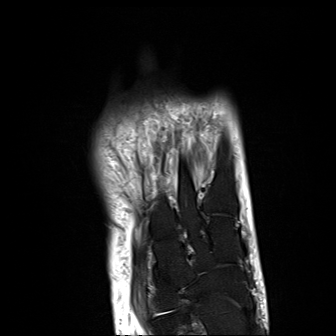
[im 38/48]
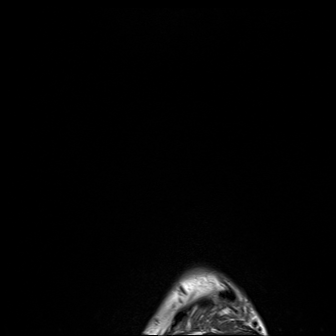
[im 48/48]
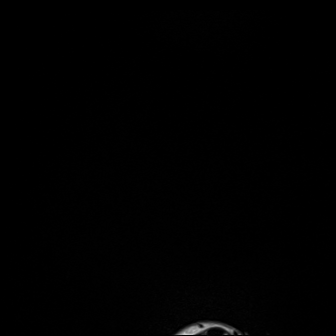

[40 of 40 positions shown; findings below may reference images not displayed]

FINDINGS: Bones/Joint/Cartilage

There is increased T2 hyperintense signal with subtle T1
hypointensity involving the fifth metatarsal head. There is slight
cortical irregularity seen laterally at the fifth metatarsal head.
Normal osseous marrow signal seen throughout the remainder of the
osseous structures. No large joint effusions.

Ligaments

The Lisfranc ligaments are intact.

Muscles and Tendons

Diffusely increased T2 hyperintense signal seen throughout the
muscles. The flexor and extensor tendons are intact. The plantar
fascia is intact.

Soft tissues

Along the dorsum and lateral aspect of the fifth metatarsal head
there is subtle area of skin irregularity. A small loculated fluid
collection is noted overlying the fifth metatarsal head which
measures 1.7 x 0.6 x 1.5 cm. Subcutaneous edema seen overlying the
dorsum of foot.
IMPRESSION: Area of superficial ulceration with a probable soft tissue abscess
overlying the fifth metatarsal head measuring 1.7 x 0.6 x 1.5 cm.

Findings suggestive of osteomyelitis involving the fifth metatarsal
head. No other acute osseous abnormalities.

## 2021-05-05 DIAGNOSIS — D689 Coagulation defect, unspecified: Secondary | ICD-10-CM | POA: Diagnosis not present

## 2021-05-05 DIAGNOSIS — N2581 Secondary hyperparathyroidism of renal origin: Secondary | ICD-10-CM | POA: Diagnosis not present

## 2021-05-05 DIAGNOSIS — Z992 Dependence on renal dialysis: Secondary | ICD-10-CM | POA: Diagnosis not present

## 2021-05-05 DIAGNOSIS — D631 Anemia in chronic kidney disease: Secondary | ICD-10-CM | POA: Diagnosis not present

## 2021-05-05 DIAGNOSIS — N186 End stage renal disease: Secondary | ICD-10-CM | POA: Diagnosis not present

## 2021-05-05 DIAGNOSIS — D509 Iron deficiency anemia, unspecified: Secondary | ICD-10-CM | POA: Diagnosis not present

## 2021-05-08 DIAGNOSIS — D689 Coagulation defect, unspecified: Secondary | ICD-10-CM | POA: Diagnosis not present

## 2021-05-08 DIAGNOSIS — N2581 Secondary hyperparathyroidism of renal origin: Secondary | ICD-10-CM | POA: Diagnosis not present

## 2021-05-08 DIAGNOSIS — N186 End stage renal disease: Secondary | ICD-10-CM | POA: Diagnosis not present

## 2021-05-08 DIAGNOSIS — D631 Anemia in chronic kidney disease: Secondary | ICD-10-CM | POA: Diagnosis not present

## 2021-05-08 DIAGNOSIS — Z992 Dependence on renal dialysis: Secondary | ICD-10-CM | POA: Diagnosis not present

## 2021-05-08 DIAGNOSIS — D509 Iron deficiency anemia, unspecified: Secondary | ICD-10-CM | POA: Diagnosis not present

## 2021-05-10 DIAGNOSIS — D631 Anemia in chronic kidney disease: Secondary | ICD-10-CM | POA: Diagnosis not present

## 2021-05-10 DIAGNOSIS — N2581 Secondary hyperparathyroidism of renal origin: Secondary | ICD-10-CM | POA: Diagnosis not present

## 2021-05-10 DIAGNOSIS — D689 Coagulation defect, unspecified: Secondary | ICD-10-CM | POA: Diagnosis not present

## 2021-05-10 DIAGNOSIS — D509 Iron deficiency anemia, unspecified: Secondary | ICD-10-CM | POA: Diagnosis not present

## 2021-05-10 DIAGNOSIS — N186 End stage renal disease: Secondary | ICD-10-CM | POA: Diagnosis not present

## 2021-05-10 DIAGNOSIS — Z992 Dependence on renal dialysis: Secondary | ICD-10-CM | POA: Diagnosis not present

## 2021-05-12 DIAGNOSIS — D509 Iron deficiency anemia, unspecified: Secondary | ICD-10-CM | POA: Diagnosis not present

## 2021-05-12 DIAGNOSIS — N2581 Secondary hyperparathyroidism of renal origin: Secondary | ICD-10-CM | POA: Diagnosis not present

## 2021-05-12 DIAGNOSIS — D689 Coagulation defect, unspecified: Secondary | ICD-10-CM | POA: Diagnosis not present

## 2021-05-12 DIAGNOSIS — N186 End stage renal disease: Secondary | ICD-10-CM | POA: Diagnosis not present

## 2021-05-12 DIAGNOSIS — Z992 Dependence on renal dialysis: Secondary | ICD-10-CM | POA: Diagnosis not present

## 2021-05-12 DIAGNOSIS — D631 Anemia in chronic kidney disease: Secondary | ICD-10-CM | POA: Diagnosis not present

## 2021-05-16 DIAGNOSIS — H2512 Age-related nuclear cataract, left eye: Secondary | ICD-10-CM | POA: Diagnosis not present

## 2021-05-16 DIAGNOSIS — Z961 Presence of intraocular lens: Secondary | ICD-10-CM | POA: Diagnosis not present

## 2021-05-16 DIAGNOSIS — N186 End stage renal disease: Secondary | ICD-10-CM | POA: Diagnosis not present

## 2021-05-16 DIAGNOSIS — Z992 Dependence on renal dialysis: Secondary | ICD-10-CM | POA: Diagnosis not present

## 2021-05-16 DIAGNOSIS — I120 Hypertensive chronic kidney disease with stage 5 chronic kidney disease or end stage renal disease: Secondary | ICD-10-CM | POA: Diagnosis not present

## 2021-05-16 DIAGNOSIS — H401133 Primary open-angle glaucoma, bilateral, severe stage: Secondary | ICD-10-CM | POA: Diagnosis not present

## 2021-05-17 DIAGNOSIS — N186 End stage renal disease: Secondary | ICD-10-CM | POA: Diagnosis not present

## 2021-05-17 DIAGNOSIS — D689 Coagulation defect, unspecified: Secondary | ICD-10-CM | POA: Diagnosis not present

## 2021-05-17 DIAGNOSIS — N2581 Secondary hyperparathyroidism of renal origin: Secondary | ICD-10-CM | POA: Diagnosis not present

## 2021-05-17 DIAGNOSIS — Z992 Dependence on renal dialysis: Secondary | ICD-10-CM | POA: Diagnosis not present

## 2021-05-17 DIAGNOSIS — D509 Iron deficiency anemia, unspecified: Secondary | ICD-10-CM | POA: Diagnosis not present

## 2021-05-18 ENCOUNTER — Other Ambulatory Visit: Payer: Self-pay

## 2021-05-18 ENCOUNTER — Ambulatory Visit: Payer: Medicare Other | Attending: Nephrology

## 2021-05-18 DIAGNOSIS — M25611 Stiffness of right shoulder, not elsewhere classified: Secondary | ICD-10-CM | POA: Insufficient documentation

## 2021-05-18 DIAGNOSIS — G629 Polyneuropathy, unspecified: Secondary | ICD-10-CM | POA: Insufficient documentation

## 2021-05-18 DIAGNOSIS — R278 Other lack of coordination: Secondary | ICD-10-CM | POA: Insufficient documentation

## 2021-05-18 DIAGNOSIS — M6281 Muscle weakness (generalized): Secondary | ICD-10-CM | POA: Insufficient documentation

## 2021-05-18 DIAGNOSIS — M25612 Stiffness of left shoulder, not elsewhere classified: Secondary | ICD-10-CM | POA: Insufficient documentation

## 2021-05-18 DIAGNOSIS — M25641 Stiffness of right hand, not elsewhere classified: Secondary | ICD-10-CM | POA: Insufficient documentation

## 2021-05-18 DIAGNOSIS — M25642 Stiffness of left hand, not elsewhere classified: Secondary | ICD-10-CM | POA: Insufficient documentation

## 2021-05-18 NOTE — Therapy (Signed)
[facility] [address] [place], [place], [address] Phone: [phone]   Fax:  [phone]  Physical Therapy Evaluation  Patient Details  Name: Timothy Ponce MRN: [id number] Date of Birth: 09-02-1956 No data recorded  Encounter Date: 05/18/2021   PT End of Session - 05/18/21 0911    Visit Number 0    Number of Visits 0    PT Start Time 0845    PT Stop Time 0900    PT Time Calculation (min) 15 min           Past Medical History:  Diagnosis Date  . Cataract   . Complication of anesthesia    "Awakens during surgery"  . Dyspnea   . ESRD (end stage renal disease) ([place])    TTHSAT, [place]   . Glaucoma   . Neuropathy    hands - per patient    Past Surgical History:  Procedure Laterality Date  . AMPUTATION Right 06/02/2020   Procedure: RIGHT FOOT 5TH RAY AMPUTATION;  Surgeon: Newt Minion, MD;  Location: [place];  Service: Orthopedics;  Laterality: Right;  . AV FISTULA PLACEMENT    . INSERTION OF DIALYSIS CATHETER Right 12/08/2020   Procedure: INSERTION OF DIALYSIS CATHETER USING 19cm PALINDROME CHRONIC CATHETER;  Surgeon: Waynetta Sandy, MD;  Location: [place];  Service: Vascular;  Laterality: Right;  . REVISON OF ARTERIOVENOUS FISTULA Right 12/06/2020   Procedure: REVISON OF RIGHT ARM FISTULA;  Surgeon: Rosetta Posner, MD;  Location: [place];  Service: Vascular;  Laterality: Right;    There were no vitals filed for this visit.    Patient was screened by Physical Therapy this date.  Upon assessment, patient would benefit from an OT refrerral, MD contacted for referral to OT                Objective measurements completed on examination: See above findings.                             Patient will benefit from skilled therapeutic intervention in order to improve the following deficits and impairments:     Visit Diagnosis: Neuropathy  Muscle weakness  (generalized)     Problem List Patient Active Problem List   Diagnosis Date Noted  . Complication of vascular dialysis catheter 12/10/2020  . Encounter for removal of sutures 06/24/2020  . ESRD on hemodialysis ([place]) 05/31/2020  . Anemia of chronic renal failure 05/31/2020  . Peripheral arterial disease ([place]) 05/31/2020  . Osteomyelitis of fifth toe of right foot ([place]) 05/30/2020  . Diarrhea, unspecified 05/17/2020  . Allergy, unspecified, initial encounter 10/04/2019  . Other disorders of phosphorus metabolism 12/05/2014  . Aftercare including intermittent dialysis ([place]) 09/14/2014  . Fever, unspecified 09/14/2014  . Headache, unspecified 09/14/2014  . Coagulation defect, unspecified ([place]) 09/14/2014  . Pruritus, unspecified 09/14/2014  . Pain, unspecified 08/24/2014  . Hyperkalemia 12/03/2013  . Encounter for immunization 09/25/2013  . Hypercalcemia 08/03/2013  . Other abnormal glucose 09/25/2012  . 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 Secondary hyperparathyroidism of renal origin ([place]) 06/16/2002  . Iron deficiency anemia, unspecified 09/18/2001    Lanice Shirts PT 05/18/2021, 9:12 AM  [place] [address] [place], [place], [address] Phone: [phone]   Fax:  [phone]  Name: Timothy Ponce MRN: [id number] Date of Birth: 1955/12/19

## 2021-05-19 DIAGNOSIS — D689 Coagulation defect, unspecified: Secondary | ICD-10-CM | POA: Diagnosis not present

## 2021-05-19 DIAGNOSIS — D509 Iron deficiency anemia, unspecified: Secondary | ICD-10-CM | POA: Diagnosis not present

## 2021-05-19 DIAGNOSIS — Z992 Dependence on renal dialysis: Secondary | ICD-10-CM | POA: Diagnosis not present

## 2021-05-19 DIAGNOSIS — N186 End stage renal disease: Secondary | ICD-10-CM | POA: Diagnosis not present

## 2021-05-19 DIAGNOSIS — N2581 Secondary hyperparathyroidism of renal origin: Secondary | ICD-10-CM | POA: Diagnosis not present

## 2021-05-22 DIAGNOSIS — N186 End stage renal disease: Secondary | ICD-10-CM | POA: Diagnosis not present

## 2021-05-22 DIAGNOSIS — D509 Iron deficiency anemia, unspecified: Secondary | ICD-10-CM | POA: Diagnosis not present

## 2021-05-22 DIAGNOSIS — Z992 Dependence on renal dialysis: Secondary | ICD-10-CM | POA: Diagnosis not present

## 2021-05-22 DIAGNOSIS — D689 Coagulation defect, unspecified: Secondary | ICD-10-CM | POA: Diagnosis not present

## 2021-05-22 DIAGNOSIS — N2581 Secondary hyperparathyroidism of renal origin: Secondary | ICD-10-CM | POA: Diagnosis not present

## 2021-05-24 DIAGNOSIS — Z992 Dependence on renal dialysis: Secondary | ICD-10-CM | POA: Diagnosis not present

## 2021-05-24 DIAGNOSIS — D689 Coagulation defect, unspecified: Secondary | ICD-10-CM | POA: Diagnosis not present

## 2021-05-24 DIAGNOSIS — D509 Iron deficiency anemia, unspecified: Secondary | ICD-10-CM | POA: Diagnosis not present

## 2021-05-24 DIAGNOSIS — N2581 Secondary hyperparathyroidism of renal origin: Secondary | ICD-10-CM | POA: Diagnosis not present

## 2021-05-24 DIAGNOSIS — N186 End stage renal disease: Secondary | ICD-10-CM | POA: Diagnosis not present

## 2021-05-26 DIAGNOSIS — N186 End stage renal disease: Secondary | ICD-10-CM | POA: Diagnosis not present

## 2021-05-26 DIAGNOSIS — D509 Iron deficiency anemia, unspecified: Secondary | ICD-10-CM | POA: Diagnosis not present

## 2021-05-26 DIAGNOSIS — N2581 Secondary hyperparathyroidism of renal origin: Secondary | ICD-10-CM | POA: Diagnosis not present

## 2021-05-26 DIAGNOSIS — Z992 Dependence on renal dialysis: Secondary | ICD-10-CM | POA: Diagnosis not present

## 2021-05-26 DIAGNOSIS — D689 Coagulation defect, unspecified: Secondary | ICD-10-CM | POA: Diagnosis not present

## 2021-05-29 DIAGNOSIS — Z992 Dependence on renal dialysis: Secondary | ICD-10-CM | POA: Diagnosis not present

## 2021-05-29 DIAGNOSIS — N186 End stage renal disease: Secondary | ICD-10-CM | POA: Diagnosis not present

## 2021-05-29 DIAGNOSIS — D689 Coagulation defect, unspecified: Secondary | ICD-10-CM | POA: Diagnosis not present

## 2021-05-29 DIAGNOSIS — D509 Iron deficiency anemia, unspecified: Secondary | ICD-10-CM | POA: Diagnosis not present

## 2021-05-29 DIAGNOSIS — N2581 Secondary hyperparathyroidism of renal origin: Secondary | ICD-10-CM | POA: Diagnosis not present

## 2021-05-30 ENCOUNTER — Other Ambulatory Visit: Payer: Self-pay

## 2021-05-30 ENCOUNTER — Ambulatory Visit (INDEPENDENT_AMBULATORY_CARE_PROVIDER_SITE_OTHER): Payer: Medicare Other | Admitting: Podiatry

## 2021-05-30 ENCOUNTER — Encounter: Payer: Self-pay | Admitting: Podiatry

## 2021-05-30 DIAGNOSIS — I739 Peripheral vascular disease, unspecified: Secondary | ICD-10-CM | POA: Diagnosis not present

## 2021-05-30 DIAGNOSIS — M79674 Pain in right toe(s): Secondary | ICD-10-CM | POA: Diagnosis not present

## 2021-05-30 DIAGNOSIS — Z992 Dependence on renal dialysis: Secondary | ICD-10-CM | POA: Diagnosis not present

## 2021-05-30 DIAGNOSIS — N186 End stage renal disease: Secondary | ICD-10-CM | POA: Diagnosis not present

## 2021-05-30 DIAGNOSIS — B351 Tinea unguium: Secondary | ICD-10-CM | POA: Diagnosis not present

## 2021-05-30 DIAGNOSIS — D689 Coagulation defect, unspecified: Secondary | ICD-10-CM | POA: Diagnosis not present

## 2021-05-30 DIAGNOSIS — M79675 Pain in left toe(s): Secondary | ICD-10-CM | POA: Diagnosis not present

## 2021-05-30 DIAGNOSIS — I999 Unspecified disorder of circulatory system: Secondary | ICD-10-CM

## 2021-05-30 NOTE — Progress Notes (Signed)
This patient returns to my office for at risk foot care.  This patient requires this care by a professional since this patient will be at risk due to having ESRD, PAD,coagulation defect and amputation fifth toe right foot.  This patient is unable to cut nails himself since the patient cannot reach his nails.These nails are painful walking and wearing shoes.  This patient presents for at risk foot care today.  General Appearance  Alert, conversant and in no acute stress.  Vascular  Dorsalis pedis and posterior tibial  pulses are weakly palpable  bilaterally.  Capillary return is within normal limits  bilaterally. Cold feet  bilaterally. Absent digital hair  B/L.  Neurologic  Senn-Weinstein monofilament wire test within normal limits  bilaterally. Muscle power within normal limits bilaterally.  Nails Thick disfigured discolored nails with subungual debris  from hallux to fifth toes left and 1-4 right foot. No evidence of bacterial infection or drainage bilaterally.  Orthopedic  No limitations of motion  feet .  No crepitus or effusions noted.  No bony pathology or digital deformities noted.  Skin  normotropic skin with no porokeratosis noted bilaterally.  No signs of infections or ulcers noted.     Onychomycosis  Pain in right toes  Pain in left toes  Consent was obtained for treatment procedures.   Mechanical debridement of nails 1-5  left foot and 1-4 right foot. performed with a nail nipper.  Filed with dremel without incident.    Return office visit   3 months                   Told patient to return for periodic foot care and evaluation due to potential at risk complications.   Ritha Sampedro DPM   

## 2021-05-31 DIAGNOSIS — N186 End stage renal disease: Secondary | ICD-10-CM | POA: Diagnosis not present

## 2021-05-31 DIAGNOSIS — N2581 Secondary hyperparathyroidism of renal origin: Secondary | ICD-10-CM | POA: Diagnosis not present

## 2021-05-31 DIAGNOSIS — D509 Iron deficiency anemia, unspecified: Secondary | ICD-10-CM | POA: Diagnosis not present

## 2021-05-31 DIAGNOSIS — Z992 Dependence on renal dialysis: Secondary | ICD-10-CM | POA: Diagnosis not present

## 2021-05-31 DIAGNOSIS — D689 Coagulation defect, unspecified: Secondary | ICD-10-CM | POA: Diagnosis not present

## 2021-06-02 DIAGNOSIS — D689 Coagulation defect, unspecified: Secondary | ICD-10-CM | POA: Diagnosis not present

## 2021-06-02 DIAGNOSIS — N2581 Secondary hyperparathyroidism of renal origin: Secondary | ICD-10-CM | POA: Diagnosis not present

## 2021-06-02 DIAGNOSIS — N186 End stage renal disease: Secondary | ICD-10-CM | POA: Diagnosis not present

## 2021-06-02 DIAGNOSIS — D509 Iron deficiency anemia, unspecified: Secondary | ICD-10-CM | POA: Diagnosis not present

## 2021-06-02 DIAGNOSIS — Z992 Dependence on renal dialysis: Secondary | ICD-10-CM | POA: Diagnosis not present

## 2021-06-05 DIAGNOSIS — D509 Iron deficiency anemia, unspecified: Secondary | ICD-10-CM | POA: Diagnosis not present

## 2021-06-05 DIAGNOSIS — N2581 Secondary hyperparathyroidism of renal origin: Secondary | ICD-10-CM | POA: Diagnosis not present

## 2021-06-05 DIAGNOSIS — N186 End stage renal disease: Secondary | ICD-10-CM | POA: Diagnosis not present

## 2021-06-05 DIAGNOSIS — D689 Coagulation defect, unspecified: Secondary | ICD-10-CM | POA: Diagnosis not present

## 2021-06-05 DIAGNOSIS — Z992 Dependence on renal dialysis: Secondary | ICD-10-CM | POA: Diagnosis not present

## 2021-06-06 ENCOUNTER — Other Ambulatory Visit: Payer: Self-pay

## 2021-06-06 ENCOUNTER — Ambulatory Visit: Payer: Medicare Other | Admitting: Occupational Therapy

## 2021-06-06 DIAGNOSIS — M25642 Stiffness of left hand, not elsewhere classified: Secondary | ICD-10-CM | POA: Diagnosis not present

## 2021-06-06 DIAGNOSIS — M25612 Stiffness of left shoulder, not elsewhere classified: Secondary | ICD-10-CM | POA: Diagnosis not present

## 2021-06-06 DIAGNOSIS — R278 Other lack of coordination: Secondary | ICD-10-CM

## 2021-06-06 DIAGNOSIS — M25611 Stiffness of right shoulder, not elsewhere classified: Secondary | ICD-10-CM | POA: Diagnosis not present

## 2021-06-06 DIAGNOSIS — M25641 Stiffness of right hand, not elsewhere classified: Secondary | ICD-10-CM | POA: Diagnosis not present

## 2021-06-06 DIAGNOSIS — M6281 Muscle weakness (generalized): Secondary | ICD-10-CM | POA: Diagnosis not present

## 2021-06-06 DIAGNOSIS — G629 Polyneuropathy, unspecified: Secondary | ICD-10-CM | POA: Diagnosis not present

## 2021-06-06 NOTE — Therapy (Signed)
[facility] [address] [place], [place], [address] Phone: [phone]   Fax:  [phone]  Occupational Therapy Treatment  Patient Details  Name: Timothy Ponce MRN: [id number] Date of Birth: 02/21/1956 Referring Provider (OT): Dr. Joelyn Oms   Encounter Date: 06/06/2021   OT End of Session - 06/06/21 1625     Visit Number 1    Number of Visits 24    Date for OT Re-Evaluation 08/29/21    Authorization Type Medicare, BCBS, Medicaid    Authorization - Visit Number 1    Authorization - Number of Visits 10    Progress Note Due on Visit 10    OT Start Time 1320    OT Stop Time 1400    OT Time Calculation (min) 40 min             Past Medical History:  Diagnosis Date   Cataract    Complication of anesthesia    "Awakens during surgery"   Dyspnea    ESRD (end stage renal disease) ([place])    TTHSAT, [address]    Glaucoma    Neuropathy    hands - per patient    Past Surgical History:  Procedure Laterality Date   AMPUTATION Right 06/02/2020   Procedure: RIGHT FOOT 5TH RAY AMPUTATION;  Surgeon: Newt Minion, MD;  Location: [facility];  Service: Orthopedics;  Laterality: Right;   AV FISTULA PLACEMENT     INSERTION OF DIALYSIS CATHETER Right 12/08/2020   Procedure: INSERTION OF DIALYSIS CATHETER USING 19cm PALINDROME CHRONIC CATHETER;  Surgeon: Waynetta Sandy, MD;  Location: [facility];  Service: Vascular;  Laterality: Right;   REVISON OF ARTERIOVENOUS FISTULA Right 12/06/2020   Procedure: REVISON OF RIGHT ARM FISTULA;  Surgeon: Rosetta Posner, MD;  Location: [facility];  Service: Vascular;  Laterality: Right;    There were no vitals filed for this visit.   Subjective Assessment - 06/06/21 1325     Subjective  Denies pain, just stiffness    Pertinent History Pt is a 65 y.o male with neuropathy of UE.  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 Pt presents to occupational therapy with the following deficits: decreased strength, decreased ROM, decreased coordination, visual deficits which impedes performance of ADLs/ IADLS. Pt can benefit from skilled OT to address these deficits.    Limitations Fistual RUE, no BP RUE    Patient Stated Goals get hands working    Currently in Pain? No/denies                [facility] OT Assessment - 06/06/21 1704       Assessment   Medical Diagnosis neuropathy bilateral UE's, ESRD on dialysis    Referring Provider (OT) Dr. Joelyn Oms    Onset Date/Surgical Date 05/21/21    Hand Dominance Right      Precautions   Precautions Other (comment)    Precaution Comments fistula in RUE, No BP RUE      Balance Screen   Has the patient fallen in the past 6 months No    Has the patient had a decrease in activity level because of a fear of falling?  No    Is the patient reluctant to leave their home because of a fear of falling?  No      Home  Environment   Family/patient expects to be  discharged to: Private residence    Lives With Family   mother     Prior Function   Level of Independence Independent    Vocation Full time employment    Vocation Requirements professor [employer]      ADL   Eating/Feeding Needs assist with cutting food    Grooming Modified independent    Upper Body Bathing Modified independent    Lower Body Bathing Modified independent    Upper Body Dressing Needs assist for fasteners   able to [place]   Lower Body Dressing Needs assist for fasteners    Toilet Transfer Modified independent    Tub/Shower Transfer Modified independent   tub shower   ADL comments difficulty typing      IADL   Shopping Needs to be accompanied on any shopping trip    Light Housekeeping Performs light daily tasks such as dishwashing, bed making   vacuum   Meal Prep Able to complete simple cold meal and snack prep    Medication  Management Is responsible for taking medication in correct dosages at correct time      Mobility   Mobility Status --   modified I to supervision due to visual deficits     Written Expression   Handwriting 90% legible      Vision - History   Baseline Vision Wears glasses all the time    Visual History Glaucoma    Patient Visual Report Other (comment)    Additional Comments significant visual deficits      Vision Assessment   Vision Assessment Vision not tested      Cognition   Overall Cognitive Status Within Functional Limits for tasks assessed      Sensation   Light Touch Impaired by gross assessment    Hot/Cold Impaired by gross assessment      Coordination   Fine Motor Movements are Fluid and Coordinated No    Coordination impaired coordination      ROM / Strength   AROM / PROM / Strength AROM;Strength      AROM   Overall AROM  Deficits    Overall AROM Comments RUE shoulder flexion 110, elbow ext -10, composite finger flexion 50%, elbow extension, decreased bilateral supination,composite finger flexion 60%, 90% bilateral finger extension, RUE finger thumb opposition for all digits, LUE opposes to middle digit.      Strength   Overall Strength Deficits    Overall Strength Comments RUE 4+/5LUE 4/5      Hand Function   Right Hand Gross Grasp Impaired   50% composite finger flexion   Right Hand Grip (lbs) 10.5    Left Hand Gross Grasp Impaired   60% composite finger flexion   Left Hand Grip (lbs) 10.8                                OT Short Term Goals - 06/06/21 1700       OT SHORT TERM GOAL #1   Title I with HEP    Time 4    Period Weeks    Status New      OT SHORT TERM GOAL #2   Title Pt will increase bilateral grip strength by 5 lbs for increased functional use.    Time 4    Period Weeks    Status New      OT SHORT TERM GOAL #3   Title Pt will verbalize understanding  of AE/adapted strategies to maximize safety and I with  ADLS/IADLs( buttons, zippers)    Time 4    Period Weeks    Status New      OT SHORT TERM GOAL #4   Title Pt will increase bilateral shoulder flexion by for RUE to 115, and LUE to 120 for increased ease with ADLs.    Time 4    Period Weeks    Status New               OT Long Term Goals - 06/06/21 1714       OT LONG TERM GOAL #1   Title I with  HEP for grip stength    Time 12    Period Weeks    Status New    Target Date 08/29/21      OT LONG TERM GOAL #2   Title Pt will increase bilateral grip strength to at least 20 lbs for improved ease with ADLs.    Time 12    Period Weeks    Status New      OT LONG TERM GOAL #3   Title Pt will demonstrate at least 80% composite finger flexion bilaterally for improved fuction during ADLs.    Time 12    Period Weeks    Status New      OT LONG TERM GOAL #4   Title Pt will report increased ease with writing and typing for work.    Time 12    Period Weeks    Status New      OT LONG TERM GOAL #5   Title Pt will demonstrate ability to oppose all digits with thumb for left hand for increased ease with daily activities.    Time 12    Period Weeks    Status New                   Plan - 06/06/21 1625     Clinical Impression Statement Pt is a 65 y.o male with neuropathy of UE.  [id number] of foot,  glaucoma and cataracts,  with significant visual impairment,  PVD and ESRD on dialysis TTS for 30 years due to congenital renal failure. Pt presents to occupational therapy with the following deficits: decreased strength, decreased ROM, decreased coordination, visual deficits which impedes performance of ADLs/ IADLS. Pt can benefit from skilled OT to address these deficits.    OT Occupational Profile and History Problem Focused Assessment - Including review of records relating to presenting problem    Occupational performance deficits (Please refer to evaluation for details): ADL's;IADL's;Work;Leisure;Social Participation     Body Structure / Function / Physical Skills ADL;Balance;Strength;UE functional use;Flexibility;FMC;Vision;GMC;ROM;Decreased knowledge of use of DME;Dexterity;IADL;Sensation    Rehab Potential Good    Clinical Decision Making Several treatment options, min-mod task modification necessary    Comorbidities Affecting Occupational Performance: May have comorbidities impacting occupational performance   complex medical hx   Modification or Assistance to Complete Evaluation  Min-Moderate modification of tasks or assist with assess necessary to complete eval   visual and sensory deficits   OT Frequency 2x / week   anticipate d/c after 8 weeks, POC written for 12 to acccount for missed visits etc   OT Duration 12 weeks    OT Treatment/Interventions Self-care/ADL training;Therapeutic exercise;Ultrasound;Neuromuscular education;Manual Therapy;Splinting;Therapeutic activities;Cryotherapy;Paraffin;DME and/or AE instruction;Fluidtherapy;Contrast Bath;Moist Heat;Passive range of motion;Patient/family education    Plan initate HEP for bilateral UE ROM hands, elbows , shoulders, light putty, fistula RUE  Consulted and Agree with Plan of Care Patient;Family member/caregiver             Patient will benefit from skilled therapeutic intervention in order to improve the following deficits and impairments:   Body Structure / Function / Physical Skills: ADL, Balance, Strength, UE functional use, Flexibility, FMC, Vision, GMC, ROM, Decreased knowledge of use of DME, Dexterity, IADL, Sensation       Visit Diagnosis: Muscle weakness (generalized) - Plan: Ot plan of care cert/re-cert  Other lack of coordination - Plan: Ot plan of care cert/re-cert  Stiffness of right hand, not elsewhere classified - Plan: Ot plan of care cert/re-cert  Stiffness of left hand, not elsewhere classified - Plan: Ot plan of care cert/re-cert  Stiffness of left shoulder, not elsewhere classified - Plan: Ot plan of care  cert/re-cert  Stiffness of right shoulder, not elsewhere classified - Plan: Ot plan of care cert/re-cert    Problem List Patient Active Problem List   Diagnosis Date Noted   Complication of vascular dialysis catheter 12/10/2020   Encounter for removal of sutures 06/24/2020   ESRD on hemodialysis ([facility]) 05/31/2020   Anemia of chronic renal failure 05/31/2020   Peripheral arterial disease ([facility]) 05/31/2020   Osteomyelitis of fifth toe of right foot ([facility]) 05/30/2020   Diarrhea, unspecified 05/17/2020   Allergy, unspecified, initial encounter 10/04/2019   Other disorders of phosphorus metabolism 12/05/2014   Aftercare including intermittent dialysis ([facility]) 09/14/2014   Fever, unspecified 09/14/2014   Headache, unspecified 09/14/2014   Coagulation defect, unspecified ([place]) 09/14/2014   Pruritus, unspecified 09/14/2014   Pain, unspecified 08/24/2014   Hyperkalemia 12/03/2013   Encounter for immunization 09/25/2013   Hypercalcemia 08/03/2013   Other abnormal glucose 09/25/2012   Macular corneal dystrophy 06/16/2002   Secondary hyperparathyroidism of renal origin ([facility]) 06/16/2002   Iron deficiency anemia, unspecified 09/18/2001    Yoshi Vicencio 06/06/2021, 5:21 PM Theone Murdoch, OTR/L [phone] Phone: [phone] 5:21 PM 06/06/21  [facility] [address] [place], [place], [address] Phone: [phone]   Fax:  [phone]  Name: Timothy Ponce MRN: [id number] Date of Birth: 1956/09/07

## 2021-06-07 DIAGNOSIS — D509 Iron deficiency anemia, unspecified: Secondary | ICD-10-CM | POA: Diagnosis not present

## 2021-06-07 DIAGNOSIS — Z992 Dependence on renal dialysis: Secondary | ICD-10-CM | POA: Diagnosis not present

## 2021-06-07 DIAGNOSIS — N186 End stage renal disease: Secondary | ICD-10-CM | POA: Diagnosis not present

## 2021-06-07 DIAGNOSIS — D689 Coagulation defect, unspecified: Secondary | ICD-10-CM | POA: Diagnosis not present

## 2021-06-07 DIAGNOSIS — N2581 Secondary hyperparathyroidism of renal origin: Secondary | ICD-10-CM | POA: Diagnosis not present

## 2021-06-09 DIAGNOSIS — N186 End stage renal disease: Secondary | ICD-10-CM | POA: Diagnosis not present

## 2021-06-09 DIAGNOSIS — N2581 Secondary hyperparathyroidism of renal origin: Secondary | ICD-10-CM | POA: Diagnosis not present

## 2021-06-09 DIAGNOSIS — D509 Iron deficiency anemia, unspecified: Secondary | ICD-10-CM | POA: Diagnosis not present

## 2021-06-09 DIAGNOSIS — Z992 Dependence on renal dialysis: Secondary | ICD-10-CM | POA: Diagnosis not present

## 2021-06-09 DIAGNOSIS — D689 Coagulation defect, unspecified: Secondary | ICD-10-CM | POA: Diagnosis not present

## 2021-06-12 DIAGNOSIS — D689 Coagulation defect, unspecified: Secondary | ICD-10-CM | POA: Diagnosis not present

## 2021-06-12 DIAGNOSIS — Z992 Dependence on renal dialysis: Secondary | ICD-10-CM | POA: Diagnosis not present

## 2021-06-12 DIAGNOSIS — D509 Iron deficiency anemia, unspecified: Secondary | ICD-10-CM | POA: Diagnosis not present

## 2021-06-12 DIAGNOSIS — N2581 Secondary hyperparathyroidism of renal origin: Secondary | ICD-10-CM | POA: Diagnosis not present

## 2021-06-12 DIAGNOSIS — N186 End stage renal disease: Secondary | ICD-10-CM | POA: Diagnosis not present

## 2021-06-13 ENCOUNTER — Other Ambulatory Visit: Payer: Self-pay

## 2021-06-13 ENCOUNTER — Ambulatory Visit: Payer: Medicare Other | Admitting: Occupational Therapy

## 2021-06-13 DIAGNOSIS — M25641 Stiffness of right hand, not elsewhere classified: Secondary | ICD-10-CM | POA: Diagnosis not present

## 2021-06-13 DIAGNOSIS — G629 Polyneuropathy, unspecified: Secondary | ICD-10-CM | POA: Diagnosis not present

## 2021-06-13 DIAGNOSIS — M25642 Stiffness of left hand, not elsewhere classified: Secondary | ICD-10-CM | POA: Diagnosis not present

## 2021-06-13 DIAGNOSIS — R278 Other lack of coordination: Secondary | ICD-10-CM | POA: Diagnosis not present

## 2021-06-13 DIAGNOSIS — M6281 Muscle weakness (generalized): Secondary | ICD-10-CM | POA: Diagnosis not present

## 2021-06-13 DIAGNOSIS — M25612 Stiffness of left shoulder, not elsewhere classified: Secondary | ICD-10-CM | POA: Diagnosis not present

## 2021-06-13 NOTE — Therapy (Signed)
[facility] [address] [place], [place], [address] Phone: [phone]   Fax:  [phone]  Occupational Therapy Treatment  Patient Details  Name: Timothy Ponce MRN: [id number] Date of Birth: Apr 18, 1956 Referring Provider (OT): Dr. Joelyn Oms   Encounter Date: 06/13/2021   OT End of Session - 06/13/21 1329     Visit Number 2    Number of Visits 24    Date for OT Re-Evaluation 08/29/21    Authorization Type Medicare, BCBS, Medicaid    Authorization - Visit Number 2    Authorization - Number of Visits 10    Progress Note Due on Visit 10    OT Start Time 1318    OT Stop Time 1358    OT Time Calculation (min) 40 min             Past Medical History:  Diagnosis Date   Cataract    Complication of anesthesia    "Awakens during surgery"   Dyspnea    ESRD (end stage renal disease) ([facility])    TTHSAT, [address]    Glaucoma    Neuropathy    hands - per patient    Past Surgical History:  Procedure Laterality Date   AMPUTATION Right 06/02/2020   Procedure: RIGHT FOOT 5TH RAY AMPUTATION;  Surgeon: Newt Minion, MD;  Location: [facility];  Service: Orthopedics;  Laterality: Right;   AV FISTULA PLACEMENT     INSERTION OF DIALYSIS CATHETER Right 12/08/2020   Procedure: INSERTION OF DIALYSIS CATHETER USING 19cm PALINDROME CHRONIC CATHETER;  Surgeon: Waynetta Sandy, MD;  Location: [facility];  Service: Vascular;  Laterality: Right;   REVISON OF ARTERIOVENOUS FISTULA Right 12/06/2020   Procedure: REVISON OF RIGHT ARM FISTULA;  Surgeon: Rosetta Posner, MD;  Location: [facility];  Service: Vascular;  Laterality: Right;    There were no vitals filed for this visit.   Subjective Assessment - 06/13/21 1327     Subjective  Just stiffness    Pertinent History Pt is a 65 y.o male with neuropathy of UE.  MWN:[id number] of foot,  glaucoma and cataracts,  with significant visual impairment,  PVD and ESRD on dialysis TTS for  30 years due to congenital renal failure. Pt presents to occupational therapy with the following deficits: decreased strength, decreased ROM, decreased coordination, visual deficits which impedes performance of ADLs/ IADLS. Pt can benefit from skilled OT to address these deficits.    Limitations Fistula  RUE exercise use caution,  no pressure over fistula, no BP RUE    Patient Stated Goals get hands working    Currently in Pain? No/denies                   Treatment: Paraffin to bilateral hands x 10 mins for stiffness, no adverse reactions. Pt was instructed in initial HEP for A/ROM with gentle passive stretch. Increased time required due to sever visual deficits.(Issue handout next visit, pt's mom may be able to help him if he can't see) Pt performed A/ROM isolated finger movement as if playing piano for bilateral hands, increased flexibility end of session.  Pt was walked to and from waiting room with handheld assist due to severe visual deficits.               OT Education - 06/13/21 1630     Education Details inital A/ROM HEP( as well as gentle passive stretch for composite fiexion), pt cautioned against tight grip with right index  due to triggering.    Person(s) Educated Patient    Methods Explanation;Demonstration;Verbal cues;Tactile cues   issue handout next visit   Comprehension Verbalized understanding;Verbal cues required;Returned demonstration;Tactile cues required              OT Short Term Goals - 06/06/21 1700       OT SHORT TERM GOAL #1   Title I with HEP    Time 4    Period Weeks    Status New      OT SHORT TERM GOAL #2   Title Pt will increase bilateral grip strength by 5 lbs for increased functional use.    Time 4    Period Weeks    Status New      OT SHORT TERM GOAL #3   Title Pt will verbalzie understanding of AE/adapted strategies to maximize safety and I with ADLS/IADLs( buttons, zippers)    Time 4    Period Weeks    Status New       OT SHORT TERM GOAL #4   Title Pt will increase bilateral shoulder flexion by for RUE to 115, and LUE to 120 for increased ease with ADLs.    Time 4    Period Weeks    Status New               OT Long Term Goals - 06/06/21 1714       OT LONG TERM GOAL #1   Title I with  HEP for grip stength    Time 12    Period Weeks    Status New    Target Date 08/29/21      OT LONG TERM GOAL #2   Title Pt will increase bilateral grip strength to at least 20 lbs for improved ease with ADLs.    Time 12    Period Weeks    Status New      OT LONG TERM GOAL #3   Title Pt will demonstrate at least 80% composite finger flexion bilaterally for improved fuction during ADLs.    Time 12    Period Weeks    Status New      OT LONG TERM GOAL #4   Title Pt will report increased ease with writing and typing for work.    Time 12    Period Weeks    Status New      OT LONG TERM GOAL #5   Title Pt will demonstrate ability to oppose all digits with thumb for left hand for increased ease with daily activities.    Time 12    Period Weeks    Status New                   Plan - 06/13/21 1331     Clinical Impression Statement Pt is progressing towards goals. Paraffin was used instead of fluidotherapy due to pt's fistula at right wrist. Pt demonstrates improved ROM at end of session. Pt has triggering at index of right hand  use caution with tight grip, no putty for right hand now.    OT Occupational Profile and History Problem Focused Assessment - Including review of records relating to presenting problem    Occupational performance deficits (Please refer to evaluation for details): ADL's;IADL's;Work;Leisure;Social Participation    Body Structure / Function / Physical Skills ADL;Balance;Strength;UE functional use;Flexibility;FMC;Vision;GMC;ROM;Decreased knowledge of use of DME;Dexterity;IADL;Sensation    Rehab Potential Good    Clinical Decision Making Several treatment options, min-mod  task modification  necessary    Comorbidities Affecting Occupational Performance: May have comorbidities impacting occupational performance   complex medical hx   Modification or Assistance to Complete Evaluation  Min-Moderate modification of tasks or assist with assess necessary to complete eval   visual and sensory deficits   OT Frequency 2x / week   anticipate d/c after 8 weeks, POC written for 12 to acccount for missed visits etc   OT Duration 12 weeks    OT Treatment/Interventions Self-care/ADL training;Therapeutic exercise;Ultrasound;Neuromuscular education;Manual Therapy;Splinting;Therapeutic activities;Cryotherapy;Paraffin;DME and/or AE instruction;Fluidtherapy;Contrast Bath;Moist Heat;Passive range of motion;Patient/family education    Plan can use paraffin , ROM, issue coordination HEP as able,  be careful regarding fistula RUE    Consulted and Agree with Plan of Care Patient;Family member/caregiver             Patient will benefit from skilled therapeutic intervention in order to improve the following deficits and impairments:   Body Structure / Function / Physical Skills: ADL, Balance, Strength, UE functional use, Flexibility, FMC, Vision, GMC, ROM, Decreased knowledge of use of DME, Dexterity, IADL, Sensation       Visit Diagnosis: Muscle weakness (generalized)  Other lack of coordination  Stiffness of right hand, not elsewhere classified    Problem List Patient Active Problem List   Diagnosis Date Noted   Complication of vascular dialysis catheter 12/10/2020   Encounter for removal of sutures 06/24/2020   ESRD on hemodialysis ([facility]) 05/31/2020   Anemia of chronic renal failure 05/31/2020   Peripheral arterial disease ([place]) 05/31/2020   Osteomyelitis of fifth toe of right foot ([place] [facility]) 05/30/2020   Diarrhea, unspecified 05/17/2020   Allergy, unspecified, initial encounter 10/04/2019   Other disorders of phosphorus metabolism 12/05/2014   Aftercare including  intermittent dialysis ([place]) 09/14/2014   Fever, unspecified 09/14/2014   Headache, unspecified 09/14/2014   Coagulation defect, unspecified ([place]) 09/14/2014   Pruritus, unspecified 09/14/2014   Pain, unspecified 08/24/2014   Hyperkalemia 12/03/2013   Encounter for immunization 09/25/2013   Hypercalcemia 08/03/2013   Other abnormal glucose 09/25/2012   Macular corneal dystrophy 06/16/2002   Secondary hyperparathyroidism of renal origin ([facility]) 06/16/2002   Iron deficiency anemia, unspecified 09/18/2001    Annabelle Rexroad 06/13/2021, 4:33 PM  [facility] [address] [place], [place], [address] Phone: [phone]   Fax:  [phone]  Name: WES LEZOTTE MRN: [id number] Date of Birth: 18-Sep-1956

## 2021-06-13 NOTE — Patient Instructions (Signed)
Flexor Tendon Gliding (Active Hook Fist)   MP Flexion (Active)   With back of hand on table, bend large knuckles as far as they will go, keeping small joints straight. Repeat _10-15___ times. Do __3-4_ sessions per day. Activity: Reach into a narrow container.*      Finger Flexion / Extension   With palm up, bend fingers of left hand toward palm, making a  fist. Straighten fingers, opening fist. Repeat sequence _10-15___ times per session. Do _2_ sessions per day. Gentle with right index finger to avoid triggering   Copyright  V[employer]. All rights reserved.   Opposition (Active)   Touch tip of thumb to nail tip of each finger in turn, making an "O" shape. Repeat __10__ times. Do _2__ sessions per day.   MP Flexion (Active)   Bend thumb to touch base of little finger, keeping tip joint straight. Repeat __10-15__ times. Do ____ sessions per day.

## 2021-06-14 DIAGNOSIS — D689 Coagulation defect, unspecified: Secondary | ICD-10-CM | POA: Diagnosis not present

## 2021-06-14 DIAGNOSIS — N2581 Secondary hyperparathyroidism of renal origin: Secondary | ICD-10-CM | POA: Diagnosis not present

## 2021-06-14 DIAGNOSIS — Z992 Dependence on renal dialysis: Secondary | ICD-10-CM | POA: Diagnosis not present

## 2021-06-14 DIAGNOSIS — D509 Iron deficiency anemia, unspecified: Secondary | ICD-10-CM | POA: Diagnosis not present

## 2021-06-14 DIAGNOSIS — N186 End stage renal disease: Secondary | ICD-10-CM | POA: Diagnosis not present

## 2021-06-15 ENCOUNTER — Encounter: Payer: Self-pay | Admitting: Occupational Therapy

## 2021-06-15 ENCOUNTER — Other Ambulatory Visit: Payer: Self-pay

## 2021-06-15 ENCOUNTER — Ambulatory Visit: Payer: Medicare Other | Attending: Nephrology | Admitting: Occupational Therapy

## 2021-06-15 DIAGNOSIS — R278 Other lack of coordination: Secondary | ICD-10-CM | POA: Insufficient documentation

## 2021-06-15 DIAGNOSIS — M25612 Stiffness of left shoulder, not elsewhere classified: Secondary | ICD-10-CM | POA: Insufficient documentation

## 2021-06-15 DIAGNOSIS — D509 Iron deficiency anemia, unspecified: Secondary | ICD-10-CM | POA: Diagnosis not present

## 2021-06-15 DIAGNOSIS — M25642 Stiffness of left hand, not elsewhere classified: Secondary | ICD-10-CM | POA: Diagnosis not present

## 2021-06-15 DIAGNOSIS — M25611 Stiffness of right shoulder, not elsewhere classified: Secondary | ICD-10-CM | POA: Diagnosis not present

## 2021-06-15 DIAGNOSIS — M25641 Stiffness of right hand, not elsewhere classified: Secondary | ICD-10-CM | POA: Diagnosis not present

## 2021-06-15 DIAGNOSIS — I120 Hypertensive chronic kidney disease with stage 5 chronic kidney disease or end stage renal disease: Secondary | ICD-10-CM | POA: Diagnosis not present

## 2021-06-15 DIAGNOSIS — Z992 Dependence on renal dialysis: Secondary | ICD-10-CM | POA: Diagnosis not present

## 2021-06-15 DIAGNOSIS — N186 End stage renal disease: Secondary | ICD-10-CM | POA: Diagnosis not present

## 2021-06-15 DIAGNOSIS — M6281 Muscle weakness (generalized): Secondary | ICD-10-CM | POA: Insufficient documentation

## 2021-06-15 DIAGNOSIS — D689 Coagulation defect, unspecified: Secondary | ICD-10-CM | POA: Diagnosis not present

## 2021-06-15 DIAGNOSIS — N2581 Secondary hyperparathyroidism of renal origin: Secondary | ICD-10-CM | POA: Diagnosis not present

## 2021-06-15 NOTE — Therapy (Signed)
[facility] [address] [place], [place], [address] Phone: [phone]   Fax:  [phone]  Occupational Therapy Treatment  Patient Details  Name: Timothy Ponce MRN: [id number] Date of Birth: 1956-04-21 Referring Provider (OT): Dr. Joelyn Oms   Encounter Date: 06/15/2021   OT End of Session - 06/15/21 1020     Visit Number 3    Number of Visits 24    Date for OT Re-Evaluation 08/29/21    Authorization Type Medicare, BCBS, Medicaid    Authorization - Visit Number 3    Authorization - Number of Visits 10    Progress Note Due on Visit 10    OT Start Time 1018    OT Stop Time 1100    OT Time Calculation (min) 42 min    Activity Tolerance Patient tolerated treatment well    Behavior During Therapy WFL for tasks assessed/performed             Past Medical History:  Diagnosis Date   Cataract    Complication of anesthesia    "Awakens during surgery"   Dyspnea    ESRD (end stage renal disease) ([facility])    TTHSAT, [address]    Glaucoma    Neuropathy    hands - per patient    Past Surgical History:  Procedure Laterality Date   AMPUTATION Right 06/02/2020   Procedure: RIGHT FOOT 5TH RAY AMPUTATION;  Surgeon: Newt Minion, MD;  Location: [facility];  Service: Orthopedics;  Laterality: Right;   AV FISTULA PLACEMENT     INSERTION OF DIALYSIS CATHETER Right 12/08/2020   Procedure: INSERTION OF DIALYSIS CATHETER USING 19cm PALINDROME CHRONIC CATHETER;  Surgeon: Waynetta Sandy, MD;  Location: [facility];  Service: Vascular;  Laterality: Right;   REVISON OF ARTERIOVENOUS FISTULA Right 12/06/2020   Procedure: REVISON OF RIGHT ARM FISTULA;  Surgeon: Rosetta Posner, MD;  Location: [facility];  Service: Vascular;  Laterality: Right;    There were no vitals filed for this visit.   Subjective Assessment - 06/15/21 1020     Subjective  "not so good" (speaking of hands)    Pertinent History Pt is a 65 y.o male with  neuropathy of UE.  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 Pt presents to occupational therapy with the following deficits: decreased strength, decreased ROM, decreased coordination, visual deficits which impedes performance of ADLs/ IADLS. Pt can benefit from skilled OT to address these deficits.    Limitations Fistula  RUE exercise use caution,  no pressure over fistula, no BP RUE    Patient Stated Goals get hands working    Currently in Pain? No/denies              Paraffin BUE x 10 minutes   Yellow Theraputty                    OT Education - 06/15/21 1123     Education Details yellow theraputty - see pt instructions. avoid gripping with RUE index finger d/t trigger.    Person(s) Educated Patient    Methods Explanation;Demonstration;Verbal cues;Tactile cues   issue handout next visit   Comprehension Verbalized understanding;Verbal cues required;Returned demonstration;Tactile cues required              OT Short Term Goals - 06/15/21 1125       OT SHORT  TERM GOAL #1   Title I with HEP    Time 4    Period Weeks    Status On-going      OT SHORT TERM GOAL #2   Title Pt will increase bilateral grip strength by 5 lbs for increased functional use.    Time 4    Period Weeks    Status New      OT SHORT TERM GOAL #3   Title Pt will verbalzie understanding of AE/adapted strategies to maximize safety and I with ADLS/IADLs( buttons, zippers)    Time 4    Period Weeks    Status New      OT SHORT TERM GOAL #4   Title Pt will increase bilateral shoulder flexion by for RUE to 115, and LUE to 120 for increased ease with ADLs.    Time 4    Period Weeks    Status New               OT Long Term Goals - 06/06/21 1714       OT LONG TERM GOAL #1   Title I with  HEP for grip stength    Time 12    Period Weeks    Status New     Target Date 08/29/21      OT LONG TERM GOAL #2   Title Pt will increase bilateral grip strength to at least 20 lbs for improved ease with ADLs.    Time 12    Period Weeks    Status New      OT LONG TERM GOAL #3   Title Pt will demonstrate at least 80% composite finger flexion bilaterally for improved fuction during ADLs.    Time 12    Period Weeks    Status New      OT LONG TERM GOAL #4   Title Pt will report increased ease with writing and typing for work.    Time 12    Period Weeks    Status New      OT LONG TERM GOAL #5   Title Pt will demonstrate ability to oppose all digits with thumb for left hand for increased ease with daily activities.    Time 12    Period Weeks    Status New                   Plan - 06/15/21 1124     Clinical Impression Statement Pt with good response to paraffin BUE and progressing towards goals.    OT Occupational Profile and History Problem Focused Assessment - Including review of records relating to presenting problem    Occupational performance deficits (Please refer to evaluation for details): ADL's;IADL's;Work;Leisure;Social Participation    Body Structure / Function / Physical Skills ADL;Balance;Strength;UE functional use;Flexibility;FMC;Vision;GMC;ROM;Decreased knowledge of use of DME;Dexterity;IADL;Sensation    Rehab Potential Good    Clinical Decision Making Several treatment options, min-mod task modification necessary    Comorbidities Affecting Occupational Performance: May have comorbidities impacting occupational performance   complex medical hx   Modification or Assistance to Complete Evaluation  Min-Moderate modification of tasks or assist with assess necessary to complete eval   visual and sensory deficits   OT Frequency 2x / week   anticipate d/c after 8 weeks, POC written for 12 to acccount for missed visits etc   OT Duration 12 weeks    OT Treatment/Interventions Self-care/ADL training;Therapeutic  exercise;Ultrasound;Neuromuscular education;Manual Therapy;Splinting;Therapeutic activities;Cryotherapy;Paraffin;DME and/or AE instruction;Fluidtherapy;Contrast Bath;Moist Heat;Passive range of motion;Patient/family  education    Plan can use paraffin , ROM, issue coordination HEP as able,  be careful regarding fistula RUE    Consulted and Agree with Plan of Care Patient;Family member/caregiver             Patient will benefit from skilled therapeutic intervention in order to improve the following deficits and impairments:   Body Structure / Function / Physical Skills: ADL, Balance, Strength, UE functional use, Flexibility, FMC, Vision, GMC, ROM, Decreased knowledge of use of DME, Dexterity, IADL, Sensation       Visit Diagnosis: Muscle weakness (generalized)  Other lack of coordination  Stiffness of right hand, not elsewhere classified  Stiffness of left hand, not elsewhere classified  Stiffness of left shoulder, not elsewhere classified  Stiffness of right shoulder, not elsewhere classified    Problem List Patient Active Problem List   Diagnosis Date Noted   Complication of vascular dialysis catheter 12/10/2020   Encounter for removal of sutures 06/24/2020   ESRD on hemodialysis ([facility]) 05/31/2020   Anemia of chronic renal failure 05/31/2020   Peripheral arterial disease ([facility]) 05/31/2020   Osteomyelitis of fifth toe of right foot ([facility]) 05/30/2020   Diarrhea, unspecified 05/17/2020   Allergy, unspecified, initial encounter 10/04/2019   Other disorders of phosphorus metabolism 12/05/2014   Aftercare including intermittent dialysis ([facility]) 09/14/2014   Fever, unspecified 09/14/2014   Headache, unspecified 09/14/2014   Coagulation defect, unspecified ([facility]) 09/14/2014   Pruritus, unspecified 09/14/2014   Pain, unspecified 08/24/2014   Hyperkalemia 12/03/2013   Encounter for immunization 09/25/2013   Hypercalcemia 08/03/2013   Other abnormal glucose 09/25/2012   Macular  corneal dystrophy 06/16/2002   Secondary hyperparathyroidism of renal origin ([facility]) 06/16/2002   Iron deficiency anemia, unspecified 09/18/2001    Zachery Conch MOT, OTR/L  06/15/2021, 11:25 AM  [facility] [address] [place], [place], [address] Phone: [phone]   Fax:  [phone]  Name: Timothy Ponce MRN: [id number] Date of Birth: 29-Sep-1956

## 2021-06-15 NOTE — Patient Instructions (Signed)
1. Grip Strengthening (Resistive Putty)   Squeeze putty using thumb and all fingers. Repeat _20___ times. Do __2__ sessions per day.   2. Roll putty into tube on table and pinch between each finger and thumb x 10 reps each. (can do ring and small finger together)     Copyright  VHI. All rights reserved.   

## 2021-06-16 DIAGNOSIS — D689 Coagulation defect, unspecified: Secondary | ICD-10-CM | POA: Diagnosis not present

## 2021-06-16 DIAGNOSIS — N186 End stage renal disease: Secondary | ICD-10-CM | POA: Diagnosis not present

## 2021-06-16 DIAGNOSIS — D509 Iron deficiency anemia, unspecified: Secondary | ICD-10-CM | POA: Diagnosis not present

## 2021-06-16 DIAGNOSIS — N2581 Secondary hyperparathyroidism of renal origin: Secondary | ICD-10-CM | POA: Diagnosis not present

## 2021-06-16 DIAGNOSIS — Z992 Dependence on renal dialysis: Secondary | ICD-10-CM | POA: Diagnosis not present

## 2021-06-19 DIAGNOSIS — Z992 Dependence on renal dialysis: Secondary | ICD-10-CM | POA: Diagnosis not present

## 2021-06-19 DIAGNOSIS — N186 End stage renal disease: Secondary | ICD-10-CM | POA: Diagnosis not present

## 2021-06-19 DIAGNOSIS — D689 Coagulation defect, unspecified: Secondary | ICD-10-CM | POA: Diagnosis not present

## 2021-06-19 DIAGNOSIS — N2581 Secondary hyperparathyroidism of renal origin: Secondary | ICD-10-CM | POA: Diagnosis not present

## 2021-06-19 DIAGNOSIS — D509 Iron deficiency anemia, unspecified: Secondary | ICD-10-CM | POA: Diagnosis not present

## 2021-06-20 DIAGNOSIS — H401113 Primary open-angle glaucoma, right eye, severe stage: Secondary | ICD-10-CM | POA: Diagnosis not present

## 2021-06-20 DIAGNOSIS — Z20822 Contact with and (suspected) exposure to covid-19: Secondary | ICD-10-CM | POA: Diagnosis not present

## 2021-06-23 DIAGNOSIS — N2581 Secondary hyperparathyroidism of renal origin: Secondary | ICD-10-CM | POA: Diagnosis not present

## 2021-06-23 DIAGNOSIS — D689 Coagulation defect, unspecified: Secondary | ICD-10-CM | POA: Diagnosis not present

## 2021-06-23 DIAGNOSIS — D509 Iron deficiency anemia, unspecified: Secondary | ICD-10-CM | POA: Diagnosis not present

## 2021-06-23 DIAGNOSIS — Z992 Dependence on renal dialysis: Secondary | ICD-10-CM | POA: Diagnosis not present

## 2021-06-23 DIAGNOSIS — N186 End stage renal disease: Secondary | ICD-10-CM | POA: Diagnosis not present

## 2021-06-26 DIAGNOSIS — D509 Iron deficiency anemia, unspecified: Secondary | ICD-10-CM | POA: Diagnosis not present

## 2021-06-26 DIAGNOSIS — Z992 Dependence on renal dialysis: Secondary | ICD-10-CM | POA: Diagnosis not present

## 2021-06-26 DIAGNOSIS — D689 Coagulation defect, unspecified: Secondary | ICD-10-CM | POA: Diagnosis not present

## 2021-06-26 DIAGNOSIS — N186 End stage renal disease: Secondary | ICD-10-CM | POA: Diagnosis not present

## 2021-06-26 DIAGNOSIS — N2581 Secondary hyperparathyroidism of renal origin: Secondary | ICD-10-CM | POA: Diagnosis not present

## 2021-06-27 ENCOUNTER — Other Ambulatory Visit: Payer: Self-pay

## 2021-06-27 ENCOUNTER — Ambulatory Visit: Payer: Medicare Other | Admitting: Occupational Therapy

## 2021-06-27 DIAGNOSIS — M25612 Stiffness of left shoulder, not elsewhere classified: Secondary | ICD-10-CM

## 2021-06-27 DIAGNOSIS — M25611 Stiffness of right shoulder, not elsewhere classified: Secondary | ICD-10-CM

## 2021-06-27 DIAGNOSIS — M25642 Stiffness of left hand, not elsewhere classified: Secondary | ICD-10-CM

## 2021-06-27 DIAGNOSIS — M6281 Muscle weakness (generalized): Secondary | ICD-10-CM | POA: Diagnosis not present

## 2021-06-27 DIAGNOSIS — R278 Other lack of coordination: Secondary | ICD-10-CM | POA: Diagnosis not present

## 2021-06-27 DIAGNOSIS — M25641 Stiffness of right hand, not elsewhere classified: Secondary | ICD-10-CM

## 2021-06-27 NOTE — Therapy (Signed)
[facility] [address] [place], [place], [address] Phone: [phone]   Fax:  [phone]  Occupational Therapy Treatment  Patient Details  Name: Timothy Ponce MRN: [id number] Date of Birth: Nov 30, 1956 Referring Provider (OT): Dr. Joelyn Oms   Encounter Date: 06/27/2021   OT End of Session - 06/27/21 1638     Visit Number 4    Number of Visits 24    Date for OT Re-Evaluation 08/29/21    Authorization Type Medicare, BCBS, Medicaid    Authorization - Visit Number 4    Authorization - Number of Visits 10    Progress Note Due on Visit 10    OT Start Time 1405    OT Stop Time 1445    OT Time Calculation (min) 40 min    Activity Tolerance Patient tolerated treatment well    Behavior During Therapy WFL for tasks assessed/performed             Past Medical History:  Diagnosis Date   Cataract    Complication of anesthesia    "Awakens during surgery"   Dyspnea    ESRD (end stage renal disease) ([facility])    TTHSAT, [address]    Glaucoma    Neuropathy    hands - per patient    Past Surgical History:  Procedure Laterality Date   AMPUTATION Right 06/02/2020   Procedure: RIGHT FOOT 5TH RAY AMPUTATION;  Surgeon: Newt Minion, MD;  Location: [facility];  Service: Orthopedics;  Laterality: Right;   AV FISTULA PLACEMENT     INSERTION OF DIALYSIS CATHETER Right 12/08/2020   Procedure: INSERTION OF DIALYSIS CATHETER USING 19cm PALINDROME CHRONIC CATHETER;  Surgeon: Waynetta Sandy, MD;  Location: [facility];  Service: Vascular;  Laterality: Right;   REVISON OF ARTERIOVENOUS FISTULA Right 12/06/2020   Procedure: REVISON OF RIGHT ARM FISTULA;  Surgeon: Rosetta Posner, MD;  Location: [facility];  Service: Vascular;  Laterality: Right;    There were no vitals filed for this visit.   Pt denies pain.   Parraffin x 10 mins to RUE while pt performed, A/ROM/AA/ROM composite finger flexion, MP flexion, finger thumb  opposition as able. Picking up clothespins yellow and red for sustained pinch, with LUE. No adverse reactions to paraffin, composite finger flexion and finger thumb opposition for RUE, pt instructed to avoid tight grasp due to index finger triggering. A/ROM shoulder flexion, abduction, chest press , biceps cursl with cane 10 reps each, min verbal and tactile cues.                        OT Short Term Goals - 06/15/21 1125       OT SHORT TERM GOAL #1   Title I with HEP    Time 4    Period Weeks    Status On-going      OT SHORT TERM GOAL #2   Title Pt will increase bilateral grip strength by 5 lbs for increased functional use.    Time 4    Period Weeks    Status New      OT SHORT TERM GOAL #3   Title Pt will verbalzie understanding of AE/adapted strategies to maximize safety and I with ADLS/IADLs( buttons, zippers)    Time 4    Period Weeks    Status New      OT SHORT TERM GOAL #4   Title Pt will increase bilateral shoulder flexion by for RUE to  115, and LUE to 120 for increased ease with ADLs.    Time 4    Period Weeks    Status New               OT Long Term Goals - 06/06/21 1714       OT LONG TERM GOAL #1   Title I with  HEP for grip stength    Time 12    Period Weeks    Status New    Target Date 08/29/21      OT LONG TERM GOAL #2   Title Pt will increase bilateral grip strength to at least 20 lbs for improved ease with ADLs.    Time 12    Period Weeks    Status New      OT LONG TERM GOAL #3   Title Pt will demonstrate at least 80% composite finger flexion bilaterally for improved fuction during ADLs.    Time 12    Period Weeks    Status New      OT LONG TERM GOAL #4   Title Pt will report increased ease with writing and typing for work.    Time 12    Period Weeks    Status New      OT LONG TERM GOAL #5   Title Pt will demonstrate ability to oppose all digits with thumb for left hand for increased ease with daily activities.     Time 12    Period Weeks    Status New                   Plan - 06/27/21 1639     Clinical Impression Statement Pt is progressing towards goals however he is  limited by stiffness.    OT Occupational Profile and History Problem Focused Assessment - Including review of records relating to presenting problem    Occupational performance deficits (Please refer to evaluation for details): ADL's;IADL's;Work;Leisure;Social Participation    Body Structure / Function / Physical Skills ADL;Balance;Strength;UE functional use;Flexibility;FMC;Vision;GMC;ROM;Decreased knowledge of use of DME;Dexterity;IADL;Sensation    Rehab Potential Good    Clinical Decision Making Several treatment options, min-mod task modification necessary    Comorbidities Affecting Occupational Performance: May have comorbidities impacting occupational performance   complex medical hx   Modification or Assistance to Complete Evaluation  Min-Moderate modification of tasks or assist with assess necessary to complete eval   visual and sensory deficits   OT Frequency 2x / week   anticipate d/c after 8 weeks, POC written for 12 to acccount for missed visits etc   OT Duration 12 weeks    OT Treatment/Interventions Self-care/ADL training;Therapeutic exercise;Ultrasound;Neuromuscular education;Manual Therapy;Splinting;Therapeutic activities;Cryotherapy;Paraffin;DME and/or AE instruction;Fluidtherapy;Contrast Bath;Moist Heat;Passive range of motion;Patient/family education    Plan can use paraffin , ROM, issue coordination HEP as able,  be careful regarding fistula RUE- no pressure over fistula    Consulted and Agree with Plan of Care Patient;Family member/caregiver             Patient will benefit from skilled therapeutic intervention in order to improve the following deficits and impairments:   Body Structure / Function / Physical Skills: ADL, Balance, Strength, UE functional use, Flexibility, FMC, Vision, GMC, ROM, Decreased  knowledge of use of DME, Dexterity, IADL, Sensation       Visit Diagnosis: Other lack of coordination  Stiffness of right hand, not elsewhere classified  Stiffness of left hand, not elsewhere classified  Stiffness of left shoulder, not elsewhere classified  Stiffness of right shoulder, not elsewhere classified    Problem List Patient Active Problem List   Diagnosis Date Noted   Complication of vascular dialysis catheter 12/10/2020   Encounter for removal of sutures 06/24/2020   ESRD on hemodialysis ([facility]) 05/31/2020   Anemia of chronic renal failure 05/31/2020   Peripheral arterial disease ([facility]) 05/31/2020   Osteomyelitis of fifth toe of right foot ([facility]) 05/30/2020   Diarrhea, unspecified 05/17/2020   Allergy, unspecified, initial encounter 10/04/2019   Other disorders of phosphorus metabolism 12/05/2014   Aftercare including intermittent dialysis ([place] [facility]) 09/14/2014   Fever, unspecified 09/14/2014   Headache, unspecified 09/14/2014   Coagulation defect, unspecified ([facility]) 09/14/2014   Pruritus, unspecified 09/14/2014   Pain, unspecified 08/24/2014   Hyperkalemia 12/03/2013   Encounter for immunization 09/25/2013   Hypercalcemia 08/03/2013   Other abnormal glucose 09/25/2012   Macular corneal dystrophy 06/16/2002   Secondary hyperparathyroidism of renal origin ([facility]) 06/16/2002   Iron deficiency anemia, unspecified 09/18/2001    Khara Renaud 06/27/2021, 4:40 PM  [facility] [address] [place], [place], [address] Phone: [phone]   Fax:  [phone]  Name: Timothy Ponce MRN: [id number] Date of Birth: [birth year]

## 2021-06-28 DIAGNOSIS — D689 Coagulation defect, unspecified: Secondary | ICD-10-CM | POA: Diagnosis not present

## 2021-06-28 DIAGNOSIS — N186 End stage renal disease: Secondary | ICD-10-CM | POA: Diagnosis not present

## 2021-06-28 DIAGNOSIS — Z992 Dependence on renal dialysis: Secondary | ICD-10-CM | POA: Diagnosis not present

## 2021-06-28 DIAGNOSIS — D509 Iron deficiency anemia, unspecified: Secondary | ICD-10-CM | POA: Diagnosis not present

## 2021-06-28 DIAGNOSIS — N2581 Secondary hyperparathyroidism of renal origin: Secondary | ICD-10-CM | POA: Diagnosis not present

## 2021-06-29 ENCOUNTER — Ambulatory Visit: Payer: Medicare Other | Admitting: Occupational Therapy

## 2021-06-30 DIAGNOSIS — N2581 Secondary hyperparathyroidism of renal origin: Secondary | ICD-10-CM | POA: Diagnosis not present

## 2021-06-30 DIAGNOSIS — D689 Coagulation defect, unspecified: Secondary | ICD-10-CM | POA: Diagnosis not present

## 2021-06-30 DIAGNOSIS — Z992 Dependence on renal dialysis: Secondary | ICD-10-CM | POA: Diagnosis not present

## 2021-06-30 DIAGNOSIS — D509 Iron deficiency anemia, unspecified: Secondary | ICD-10-CM | POA: Diagnosis not present

## 2021-06-30 DIAGNOSIS — N186 End stage renal disease: Secondary | ICD-10-CM | POA: Diagnosis not present

## 2021-07-03 DIAGNOSIS — D509 Iron deficiency anemia, unspecified: Secondary | ICD-10-CM | POA: Diagnosis not present

## 2021-07-03 DIAGNOSIS — Z992 Dependence on renal dialysis: Secondary | ICD-10-CM | POA: Diagnosis not present

## 2021-07-03 DIAGNOSIS — N186 End stage renal disease: Secondary | ICD-10-CM | POA: Diagnosis not present

## 2021-07-03 DIAGNOSIS — D689 Coagulation defect, unspecified: Secondary | ICD-10-CM | POA: Diagnosis not present

## 2021-07-03 DIAGNOSIS — N2581 Secondary hyperparathyroidism of renal origin: Secondary | ICD-10-CM | POA: Diagnosis not present

## 2021-07-04 ENCOUNTER — Other Ambulatory Visit: Payer: Self-pay

## 2021-07-04 ENCOUNTER — Ambulatory Visit: Payer: Medicare Other | Admitting: Occupational Therapy

## 2021-07-04 DIAGNOSIS — R278 Other lack of coordination: Secondary | ICD-10-CM

## 2021-07-04 DIAGNOSIS — M25641 Stiffness of right hand, not elsewhere classified: Secondary | ICD-10-CM

## 2021-07-04 DIAGNOSIS — M6281 Muscle weakness (generalized): Secondary | ICD-10-CM | POA: Diagnosis not present

## 2021-07-04 DIAGNOSIS — M25612 Stiffness of left shoulder, not elsewhere classified: Secondary | ICD-10-CM

## 2021-07-04 DIAGNOSIS — M25642 Stiffness of left hand, not elsewhere classified: Secondary | ICD-10-CM | POA: Diagnosis not present

## 2021-07-04 DIAGNOSIS — M25611 Stiffness of right shoulder, not elsewhere classified: Secondary | ICD-10-CM | POA: Diagnosis not present

## 2021-07-04 NOTE — Therapy (Signed)
[facility] [address] [place], [place], [address] Phone: [phone]   Fax:  [phone]  Occupational Therapy Treatment  Patient Details  Name: Timothy Ponce MRN: [id number] Date of Birth: [birth year] Referring Provider (OT): Dr. Joelyn Oms   Encounter Date: 07/04/2021   OT End of Session - 07/04/21 1500     Visit Number 5    Number of Visits 24    Date for OT Re-Evaluation 08/29/21    Authorization Type Medicare, BCBS, Medicaid    Authorization - Visit Number 5    Authorization - Number of Visits 10    Progress Note Due on Visit 10    OT Start Time 1315    OT Stop Time 1400    OT Time Calculation (min) 45 min    Activity Tolerance Patient tolerated treatment well    Behavior During Therapy WFL for tasks assessed/performed             Past Medical History:  Diagnosis Date   Cataract    Complication of anesthesia    "Awakens during surgery"   Dyspnea    ESRD (end stage renal disease) ([facility])    TTHSAT, [address]    Glaucoma    Neuropathy    hands - per patient    Past Surgical History:  Procedure Laterality Date   AMPUTATION Right 06/02/2020   Procedure: RIGHT FOOT 5TH RAY AMPUTATION;  Surgeon: Newt Minion, MD;  Location: [facility];  Service: Orthopedics;  Laterality: Right;   AV FISTULA PLACEMENT     INSERTION OF DIALYSIS CATHETER Right 12/08/2020   Procedure: INSERTION OF DIALYSIS CATHETER USING 19cm PALINDROME CHRONIC CATHETER;  Surgeon: Waynetta Sandy, MD;  Location: [facility];  Service: Vascular;  Laterality: Right;   REVISON OF ARTERIOVENOUS FISTULA Right 12/06/2020   Procedure: REVISON OF RIGHT ARM FISTULA;  Surgeon: Rosetta Posner, MD;  Location: [facility];  Service: Vascular;  Laterality: Right;    There were no vitals filed for this visit.   Subjective Assessment - 07/04/21 1326     Subjective  The hands feel more swollen today    Pertinent History Pt is a 65 y.o male with  neuropathy of UE.  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 Pt presents to occupational therapy with the following deficits: decreased strength, decreased ROM, decreased coordination, visual deficits which impedes performance of ADLs/ IADLS. Pt can benefit from skilled OT to address these deficits.    Limitations Fistula  RUE exercise use caution,  no pressure over fistula, no BP RUE    Patient Stated Goals get hands working    Currently in Pain? No/denies             Paraffin x 10 min Rt hand while performing P/ROM and place and hold to Lt hand. Then performed same stretches to Rt hand.   Discussed why Rt index sometimes triggers and to avoid repetitive gripping when this happens, but ok for P/ROM.   Practiced picking up checkers and placing in cone Rt and Lt hands. Practiced flipping large cards over Rt/Lt hands. Pt instructed to do at home if able and various items he could use in place of checkers (large nuts/bolts, cotton balls, plastic bottle caps)  OT Short Term Goals - 06/15/21 1125       OT SHORT TERM GOAL #1   Title I with HEP    Time 4    Period Weeks    Status On-going      OT SHORT TERM GOAL #2   Title Pt will increase bilateral grip strength by 5 lbs for increased functional use.    Time 4    Period Weeks    Status New      OT SHORT TERM GOAL #3   Title Pt will verbalzie understanding of AE/adapted strategies to maximize safety and I with ADLS/IADLs( buttons, zippers)    Time 4    Period Weeks    Status New      OT SHORT TERM GOAL #4   Title Pt will increase bilateral shoulder flexion by for RUE to 115, and LUE to 120 for increased ease with ADLs.    Time 4    Period Weeks    Status New               OT Long Term Goals - 06/06/21 1714       OT LONG TERM GOAL #1   Title I with  HEP for  grip stength    Time 12    Period Weeks    Status New    Target Date 08/29/21      OT LONG TERM GOAL #2   Title Pt will increase bilateral grip strength to at least 20 lbs for improved ease with ADLs.    Time 12    Period Weeks    Status New      OT LONG TERM GOAL #3   Title Pt will demonstrate at least 80% composite finger flexion bilaterally for improved fuction during ADLs.    Time 12    Period Weeks    Status New      OT LONG TERM GOAL #4   Title Pt will report increased ease with writing and typing for work.    Time 12    Period Weeks    Status New      OT LONG TERM GOAL #5   Title Pt will demonstrate ability to oppose all digits with thumb for left hand for increased ease with daily activities.    Time 12    Period Weeks    Status New                   Plan - 07/04/21 1501     Clinical Impression Statement Pt is progressing towards goals however he is  limited by stiffness.    OT Occupational Profile and History Problem Focused Assessment - Including review of records relating to presenting problem    Occupational performance deficits (Please refer to evaluation for details): ADL's;IADL's;Work;Leisure;Social Participation    Body Structure / Function / Physical Skills ADL;Balance;Strength;UE functional use;Flexibility;FMC;Vision;GMC;ROM;Decreased knowledge of use of DME;Dexterity;IADL;Sensation    Rehab Potential Good    Clinical Decision Making Several treatment options, min-mod task modification necessary    Comorbidities Affecting Occupational Performance: May have comorbidities impacting occupational performance   complex medical hx   Modification or Assistance to Complete Evaluation  Min-Moderate modification of tasks or assist with assess necessary to complete eval   visual and sensory deficits   OT Frequency 2x / week   anticipate d/c after 8 weeks, POC written for 12 to acccount for missed visits etc   OT Duration 12 weeks    OT  Treatment/Interventions Self-care/ADL training;Therapeutic exercise;Ultrasound;Neuromuscular education;Manual Therapy;Splinting;Therapeutic activities;Cryotherapy;Paraffin;DME and/or AE instruction;Fluidtherapy;Contrast Bath;Moist Heat;Passive range of motion;Patient/family education    Plan can use paraffin , ROM, issue coordination HEP as able,  be careful regarding fistula RUE- no pressure over fistula    Consulted and Agree with Plan of Care Patient;Family member/caregiver             Patient will benefit from skilled therapeutic intervention in order to improve the following deficits and impairments:   Body Structure / Function / Physical Skills: ADL, Balance, Strength, UE functional use, Flexibility, FMC, Vision, GMC, ROM, Decreased knowledge of use of DME, Dexterity, IADL, Sensation       Visit Diagnosis: Stiffness of right hand, not elsewhere classified  Stiffness of left hand, not elsewhere classified  Stiffness of left shoulder, not elsewhere classified  Stiffness of right shoulder, not elsewhere classified  Other lack of coordination    Problem List Patient Active Problem List   Diagnosis Date Noted   Complication of vascular dialysis catheter 12/10/2020   Encounter for removal of sutures 06/24/2020   ESRD on hemodialysis ([facility]) 05/31/2020   Anemia of chronic renal failure 05/31/2020   Peripheral arterial disease ([facility]) 05/31/2020   Osteomyelitis of fifth toe of right foot ([facility]) 05/30/2020   Diarrhea, unspecified 05/17/2020   Allergy, unspecified, initial encounter 10/04/2019   Other disorders of phosphorus metabolism 12/05/2014   Aftercare including intermittent dialysis ([facility]) 09/14/2014   Fever, unspecified 09/14/2014   Headache, unspecified 09/14/2014   Coagulation defect, unspecified ([place] [facility]) 09/14/2014   Pruritus, unspecified 09/14/2014   Pain, unspecified 08/24/2014   Hyperkalemia 12/03/2013   Encounter for immunization 09/25/2013   Hypercalcemia  08/03/2013   Other abnormal glucose 09/25/2012   Macular corneal dystrophy 06/16/2002   Secondary hyperparathyroidism of renal origin ([facility]) 06/16/2002   Iron deficiency anemia, unspecified 09/18/2001    Carey Bullocks, OTR/L 07/04/2021, 3:01 PM  [facility] [address] [place], [place], [address] Phone: [phone]   Fax:  [phone]  Name: SINAN TUCH MRN: [id number] Date of Birth: 11/13/1956

## 2021-07-06 ENCOUNTER — Encounter: Payer: Self-pay | Admitting: Occupational Therapy

## 2021-07-06 ENCOUNTER — Ambulatory Visit: Payer: Medicare Other | Admitting: Occupational Therapy

## 2021-07-06 ENCOUNTER — Other Ambulatory Visit: Payer: Self-pay

## 2021-07-06 DIAGNOSIS — M25611 Stiffness of right shoulder, not elsewhere classified: Secondary | ICD-10-CM

## 2021-07-06 DIAGNOSIS — R278 Other lack of coordination: Secondary | ICD-10-CM

## 2021-07-06 DIAGNOSIS — M25641 Stiffness of right hand, not elsewhere classified: Secondary | ICD-10-CM

## 2021-07-06 DIAGNOSIS — M25612 Stiffness of left shoulder, not elsewhere classified: Secondary | ICD-10-CM | POA: Diagnosis not present

## 2021-07-06 DIAGNOSIS — M25642 Stiffness of left hand, not elsewhere classified: Secondary | ICD-10-CM | POA: Diagnosis not present

## 2021-07-06 DIAGNOSIS — M6281 Muscle weakness (generalized): Secondary | ICD-10-CM

## 2021-07-06 NOTE — Therapy (Signed)
[facility] [address] [place], [place], [address] Phone: [phone]   Fax:  [phone]  Occupational Therapy Treatment  Patient Details  Name: Timothy Ponce MRN: [id number] Date of Birth: 04-23-1956 Referring Provider (OT): Dr. Joelyn Oms   Encounter Date: 07/06/2021   OT End of Session - 07/06/21 1428     Visit Number 6    Number of Visits 24    Date for OT Re-Evaluation 08/29/21    Authorization Type Medicare, BCBS, Medicaid    Authorization - Visit Number 6    Authorization - Number of Visits 10    Progress Note Due on Visit 10    OT Start Time 1357    OT Stop Time 1440    OT Time Calculation (min) 43 min    Activity Tolerance Patient tolerated treatment well    Behavior During Therapy WFL for tasks assessed/performed             Past Medical History:  Diagnosis Date   Cataract    Complication of anesthesia    "Awakens during surgery"   Dyspnea    ESRD (end stage renal disease) ([facility])    TTHSAT, [address]    Glaucoma    Neuropathy    hands - per patient    Past Surgical History:  Procedure Laterality Date   AMPUTATION Right 06/02/2020   Procedure: RIGHT FOOT 5TH RAY AMPUTATION;  Surgeon: Newt Minion, MD;  Location: [facility];  Service: Orthopedics;  Laterality: Right;   AV FISTULA PLACEMENT     INSERTION OF DIALYSIS CATHETER Right 12/08/2020   Procedure: INSERTION OF DIALYSIS CATHETER USING 19cm PALINDROME CHRONIC CATHETER;  Surgeon: Waynetta Sandy, MD;  Location: [facility];  Service: Vascular;  Laterality: Right;   REVISON OF ARTERIOVENOUS FISTULA Right 12/06/2020   Procedure: REVISON OF RIGHT ARM FISTULA;  Surgeon: Rosetta Posner, MD;  Location: [facility];  Service: Vascular;  Laterality: Right;    There were no vitals filed for this visit.   Subjective Assessment - 07/06/21 1359     Subjective  "It's not getting worse but I don't feel much difference"    Pertinent History Pt is  a 65 y.o male with neuropathy of UE.  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 Pt presents to occupational therapy with the following deficits: decreased strength, decreased ROM, decreased coordination, visual deficits which impedes performance of ADLs/ IADLS. Pt can benefit from skilled OT to address these deficits.    Limitations Fistula  RUE exercise use caution,  no pressure over fistula, no BP RUE    Patient Stated Goals get hands working    Currently in Pain? No/denies                          OT Treatments/Exercises (OP) - 07/06/21 1415       ADLs   ADL Comments pt reports trouble buttoning and zipping still. pt reports having a button hook. pt has snaps on several shirts that are challenging still d/t strength.      Exercises   Exercises Hand      Hand Exercises   Other Hand Exercises PROM (self) of BUE composite flexion - reviewed what went over last session. Reviewed AROM exericses. Pt independent with HEPs.      Neurological Re-education Exercises  Other Exercises 1 RUE grasp and release for increase in functional composite flexion/extension with grasping "typical" sized items. Pt encouraged to hole with more of a tripod grasp or power grasp. Incresed time. Did not place black (small) pegs back into board but was able to manipulate the remainder of the pegs/dowels      Modalities   Modalities --   did not perform paraffin today.                     OT Short Term Goals - 07/06/21 1435       OT SHORT TERM GOAL #1   Title I with HEP    Time 4    Period Weeks    Status Achieved      OT SHORT TERM GOAL #2   Title Pt will increase bilateral grip strength by 5 lbs for increased functional use.    Time 4    Period Weeks    Status On-going      OT SHORT TERM GOAL #3   Title Pt will verbalzie understanding of AE/adapted  strategies to maximize safety and I with ADLS/IADLs( buttons, zippers)    Time 4    Period Weeks    Status New      OT SHORT TERM GOAL #4   Title Pt will increase bilateral shoulder flexion by for RUE to 115, and LUE to 120 for increased ease with ADLs.    Time 4    Period Weeks    Status New               OT Long Term Goals - 06/06/21 1714       OT LONG TERM GOAL #1   Title I with  HEP for grip stength    Time 12    Period Weeks    Status New    Target Date 08/29/21      OT LONG TERM GOAL #2   Title Pt will increase bilateral grip strength to at least 20 lbs for improved ease with ADLs.    Time 12    Period Weeks    Status New      OT LONG TERM GOAL #3   Title Pt will demonstrate at least 80% composite finger flexion bilaterally for improved fuction during ADLs.    Time 12    Period Weeks    Status New      OT LONG TERM GOAL #4   Title Pt will report increased ease with writing and typing for work.    Time 12    Period Weeks    Status New      OT LONG TERM GOAL #5   Title Pt will demonstrate ability to oppose all digits with thumb for left hand for increased ease with daily activities.    Time 12    Period Weeks    Status New                   Plan - 07/06/21 1517     Clinical Impression Statement Pt is compliant with HEPs and demonstrates independence. Continue to work towards unmet goals.    OT Occupational Profile and History Problem Focused Assessment - Including review of records relating to presenting problem    Occupational performance deficits (Please refer to evaluation for details): ADL's;IADL's;Work;Leisure;Social Participation    Body Structure / Function / Physical Skills ADL;Balance;Strength;UE functional use;Flexibility;FMC;Vision;GMC;ROM;Decreased knowledge of use of DME;Dexterity;IADL;Sensation    Rehab Potential Good  Clinical Decision Making Several treatment options, min-mod task modification necessary    Comorbidities  Affecting Occupational Performance: May have comorbidities impacting occupational performance   complex medical hx   Modification or Assistance to Complete Evaluation  Min-Moderate modification of tasks or assist with assess necessary to complete eval   visual and sensory deficits   OT Frequency 2x / week   anticipate d/c after 8 weeks, POC written for 12 to acccount for missed visits etc   OT Duration 12 weeks    OT Treatment/Interventions Self-care/ADL training;Therapeutic exercise;Ultrasound;Neuromuscular education;Manual Therapy;Splinting;Therapeutic activities;Cryotherapy;Paraffin;DME and/or AE instruction;Fluidtherapy;Contrast Bath;Moist Heat;Passive range of motion;Patient/family education    Plan can use paraffin , ROM, issue coordination HEP as able,  be careful regarding fistula RUE- no pressure over fistula, might bring in shirt with him to practice snaps    Consulted and Agree with Plan of Care Patient;Family member/caregiver             Patient will benefit from skilled therapeutic intervention in order to improve the following deficits and impairments:   Body Structure / Function / Physical Skills: ADL, Balance, Strength, UE functional use, Flexibility, FMC, Vision, GMC, ROM, Decreased knowledge of use of DME, Dexterity, IADL, Sensation       Visit Diagnosis: Stiffness of right hand, not elsewhere classified  Stiffness of left shoulder, not elsewhere classified  Stiffness of right shoulder, not elsewhere classified  Stiffness of left hand, not elsewhere classified  Other lack of coordination  Muscle weakness (generalized)    Problem List Patient Active Problem List   Diagnosis Date Noted   Complication of vascular dialysis catheter 12/10/2020   Encounter for removal of sutures 06/24/2020   ESRD on hemodialysis ([facility]) 05/31/2020   Anemia of chronic renal failure 05/31/2020   Peripheral arterial disease ([facility]) 05/31/2020   Osteomyelitis of fifth toe of right foot  ([facility]) 05/30/2020   Diarrhea, unspecified 05/17/2020   Allergy, unspecified, initial encounter 10/04/2019   Other disorders of phosphorus metabolism 12/05/2014   Aftercare including intermittent dialysis ([facility]) 09/14/2014   Fever, unspecified 09/14/2014   Headache, unspecified 09/14/2014   Coagulation defect, unspecified ([facility]) 09/14/2014   Pruritus, unspecified 09/14/2014   Pain, unspecified 08/24/2014   Hyperkalemia 12/03/2013   Encounter for immunization 09/25/2013   Hypercalcemia 08/03/2013   Other abnormal glucose 09/25/2012   Macular corneal dystrophy 06/16/2002   Secondary hyperparathyroidism of renal origin ([facility]) 06/16/2002   Iron deficiency anemia, unspecified 09/18/2001    Zachery Conch MOT, OTR/L  07/06/2021, 3:24 PM  [facility] [address] [place], [place], [address] Phone: [phone]   Fax:  [phone]  Name: Timothy Ponce MRN: [id number] Date of Birth: 1956-11-30

## 2021-07-07 DIAGNOSIS — Z992 Dependence on renal dialysis: Secondary | ICD-10-CM | POA: Diagnosis not present

## 2021-07-07 DIAGNOSIS — D509 Iron deficiency anemia, unspecified: Secondary | ICD-10-CM | POA: Diagnosis not present

## 2021-07-07 DIAGNOSIS — D689 Coagulation defect, unspecified: Secondary | ICD-10-CM | POA: Diagnosis not present

## 2021-07-07 DIAGNOSIS — N2581 Secondary hyperparathyroidism of renal origin: Secondary | ICD-10-CM | POA: Diagnosis not present

## 2021-07-07 DIAGNOSIS — N186 End stage renal disease: Secondary | ICD-10-CM | POA: Diagnosis not present

## 2021-07-10 DIAGNOSIS — D509 Iron deficiency anemia, unspecified: Secondary | ICD-10-CM | POA: Diagnosis not present

## 2021-07-10 DIAGNOSIS — N186 End stage renal disease: Secondary | ICD-10-CM | POA: Diagnosis not present

## 2021-07-10 DIAGNOSIS — Z992 Dependence on renal dialysis: Secondary | ICD-10-CM | POA: Diagnosis not present

## 2021-07-10 DIAGNOSIS — D689 Coagulation defect, unspecified: Secondary | ICD-10-CM | POA: Diagnosis not present

## 2021-07-10 DIAGNOSIS — N2581 Secondary hyperparathyroidism of renal origin: Secondary | ICD-10-CM | POA: Diagnosis not present

## 2021-07-11 ENCOUNTER — Ambulatory Visit: Payer: Medicare Other | Admitting: Occupational Therapy

## 2021-07-11 ENCOUNTER — Other Ambulatory Visit: Payer: Self-pay

## 2021-07-11 DIAGNOSIS — M25611 Stiffness of right shoulder, not elsewhere classified: Secondary | ICD-10-CM

## 2021-07-11 DIAGNOSIS — R278 Other lack of coordination: Secondary | ICD-10-CM

## 2021-07-11 DIAGNOSIS — M25641 Stiffness of right hand, not elsewhere classified: Secondary | ICD-10-CM

## 2021-07-11 DIAGNOSIS — M25612 Stiffness of left shoulder, not elsewhere classified: Secondary | ICD-10-CM | POA: Diagnosis not present

## 2021-07-11 DIAGNOSIS — M6281 Muscle weakness (generalized): Secondary | ICD-10-CM

## 2021-07-11 DIAGNOSIS — M25642 Stiffness of left hand, not elsewhere classified: Secondary | ICD-10-CM

## 2021-07-11 NOTE — Patient Instructions (Signed)
Pick up coins with right and left hand to place in a container  Flip playing cards with right and left hands  Pick up bottle caps with left and right hands

## 2021-07-11 NOTE — Therapy (Signed)
[facility] [address] [place], [place], [address] Phone: [phone]   Fax:  [phone]  Occupational Therapy Treatment  Patient Details  Name: Timothy Ponce MRN: [id number] Date of Birth: Sep 28, 1956 Referring Provider (OT): Dr. Joelyn Oms   Encounter Date: 07/11/2021   OT End of Session - 07/11/21 1643     Visit Number 7    Number of Visits 24    Date for OT Re-Evaluation 08/29/21    Authorization Type Medicare, BCBS, Medicaid    Authorization - Visit Number 7    Authorization - Number of Visits 10    Progress Note Due on Visit 10    OT Start Time 1320    OT Stop Time 1400    OT Time Calculation (min) 40 min             Past Medical History:  Diagnosis Date   Cataract    Complication of anesthesia    "Awakens during surgery"   Dyspnea    ESRD (end stage renal disease) ([facility])    TTHSAT, [address]    Glaucoma    Neuropathy    hands - per patient    Past Surgical History:  Procedure Laterality Date   AMPUTATION Right 06/02/2020   Procedure: RIGHT FOOT 5TH RAY AMPUTATION;  Surgeon: Newt Minion, MD;  Location: [facility];  Service: Orthopedics;  Laterality: Right;   AV FISTULA PLACEMENT     INSERTION OF DIALYSIS CATHETER Right 12/08/2020   Procedure: INSERTION OF DIALYSIS CATHETER USING 19cm PALINDROME CHRONIC CATHETER;  Surgeon: Waynetta Sandy, MD;  Location: [facility];  Service: Vascular;  Laterality: Right;   REVISON OF ARTERIOVENOUS FISTULA Right 12/06/2020   Procedure: REVISON OF RIGHT ARM FISTULA;  Surgeon: Rosetta Posner, MD;  Location: [facility];  Service: Vascular;  Laterality: Right;    There were no vitals filed for this visit.   Pain:Pt denies pain today      Treatment: gentle passive stretch to digits in composite finger flexion/ extension, followed by review of putty exercises. Pt is unable to fasten snaps due to visual and sensory deficits, pt's mother assists. Pt  practiced writing with foam grip, he demonstrates improved performance.                OT Education - 07/11/21 1645     Education Details reviewed yellow theraputty HEP, min v.c, picking up coins to place in container and flipping playing cards for increased fine motor coordination,- handout issued,  information provided regarding zipper pulls for increased independence    Person(s) Educated Patient    Methods Explanation;Demonstration;Verbal cues;Tactile cues    Comprehension Verbalized understanding;Verbal cues required;Returned demonstration;Tactile cues required              OT Short Term Goals - 07/11/21 1328       OT SHORT TERM GOAL #1   Title I with HEP    Time 4    Period Weeks    Status Achieved      OT SHORT TERM GOAL #2   Title Pt will increase bilateral grip strength by 5 lbs for increased functional use.    Time 4    Period Weeks    Status On-going      OT SHORT TERM GOAL #3   Title Pt will verbalzie understanding of AE/adapted strategies to maximize safety and I with ADLS/IADLs( buttons, zippers)    Time 4    Period Weeks  Status On-going      OT SHORT TERM GOAL #4   Title Pt will increase bilateral shoulder flexion by for RUE to 115, and LUE to 120 for increased ease with ADLs.    Time 4    Period Weeks    Status On-going               OT Long Term Goals - 06/06/21 1714       OT LONG TERM GOAL #1   Title I with  HEP for grip stength    Time 12    Period Weeks    Status New    Target Date 08/29/21      OT LONG TERM GOAL #2   Title Pt will increase bilateral grip strength to at least 20 lbs for improved ease with ADLs.    Time 12    Period Weeks    Status New      OT LONG TERM GOAL #3   Title Pt will demonstrate at least 80% composite finger flexion bilaterally for improved fuction during ADLs.    Time 12    Period Weeks    Status New      OT LONG TERM GOAL #4   Title Pt will report increased ease with writing and  typing for work.    Time 12    Period Weeks    Status New      OT LONG TERM GOAL #5   Title Pt will demonstrate ability to oppose all digits with thumb for left hand for increased ease with daily activities.    Time 12    Period Weeks    Status New                   Plan - 07/11/21 1643     Clinical Impression Statement pt is progressing towards goals. Visual and sensory impairments impact pt's overall independence    OT Occupational Profile and History Problem Focused Assessment - Including review of records relating to presenting problem    Occupational performance deficits (Please refer to evaluation for details): ADL's;IADL's;Work;Leisure;Social Participation    Body Structure / Function / Physical Skills ADL;Balance;Strength;UE functional use;Flexibility;FMC;Vision;GMC;ROM;Decreased knowledge of use of DME;Dexterity;IADL;Sensation    Rehab Potential Good    Clinical Decision Making Several treatment options, min-mod task modification necessary    Comorbidities Affecting Occupational Performance: May have comorbidities impacting occupational performance   complex medical hx   Modification or Assistance to Complete Evaluation  Min-Moderate modification of tasks or assist with assess necessary to complete eval   visual and sensory deficits   OT Frequency 2x / week   anticipate d/c after 8 weeks, POC written for 12 to acccount for missed visits etc   OT Duration 12 weeks    OT Treatment/Interventions Self-care/ADL training;Therapeutic exercise;Ultrasound;Neuromuscular education;Manual Therapy;Splinting;Therapeutic activities;Cryotherapy;Paraffin;DME and/or AE instruction;Fluidtherapy;Contrast Bath;Moist Heat;Passive range of motion;Patient/family education    Plan Issue Cane exercises HEP for shoulder ROM- pt has significant visual impairment,   be careful regarding fistula RUE- no pressure over fistula,    Consulted and Agree with Plan of Care Patient             Patient  will benefit from skilled therapeutic intervention in order to improve the following deficits and impairments:   Body Structure / Function / Physical Skills: ADL, Balance, Strength, UE functional use, Flexibility, FMC, Vision, GMC, ROM, Decreased knowledge of use of DME, Dexterity, IADL, Sensation       Visit Diagnosis: Stiffness  of right hand, not elsewhere classified  Stiffness of left shoulder, not elsewhere classified  Stiffness of right shoulder, not elsewhere classified  Stiffness of left hand, not elsewhere classified  Other lack of coordination  Muscle weakness (generalized)    Problem List Patient Active Problem List   Diagnosis Date Noted   Complication of vascular dialysis catheter 12/10/2020   Encounter for removal of sutures 06/24/2020   ESRD on hemodialysis ([facility]) 05/31/2020   Anemia of chronic renal failure 05/31/2020   Peripheral arterial disease ([facility]) 05/31/2020   Osteomyelitis of fifth toe of right foot ([facility]) 05/30/2020   Diarrhea, unspecified 05/17/2020   Allergy, unspecified, initial encounter 10/04/2019   Other disorders of phosphorus metabolism 12/05/2014   Aftercare including intermittent dialysis ([facility]) 09/14/2014   Fever, unspecified 09/14/2014   Headache, unspecified 09/14/2014   Coagulation defect, unspecified ([facility]) 09/14/2014   Pruritus, unspecified 09/14/2014   Pain, unspecified 08/24/2014   Hyperkalemia 12/03/2013   Encounter for immunization 09/25/2013   Hypercalcemia 08/03/2013   Other abnormal glucose 09/25/2012   Macular corneal dystrophy 06/16/2002   Secondary hyperparathyroidism of renal origin ([facility]) 06/16/2002   Iron deficiency anemia, unspecified 09/18/2001    Timothy Ponce 07/11/2021, 4:47 PM  [facility] [address] [place], [place], [address] Phone: [phone]   Fax:  [phone]  Name: Timothy Ponce MRN: [id number] Date of Birth: 1956-12-05

## 2021-07-12 DIAGNOSIS — D689 Coagulation defect, unspecified: Secondary | ICD-10-CM | POA: Diagnosis not present

## 2021-07-12 DIAGNOSIS — N186 End stage renal disease: Secondary | ICD-10-CM | POA: Diagnosis not present

## 2021-07-12 DIAGNOSIS — N2581 Secondary hyperparathyroidism of renal origin: Secondary | ICD-10-CM | POA: Diagnosis not present

## 2021-07-12 DIAGNOSIS — D509 Iron deficiency anemia, unspecified: Secondary | ICD-10-CM | POA: Diagnosis not present

## 2021-07-12 DIAGNOSIS — Z992 Dependence on renal dialysis: Secondary | ICD-10-CM | POA: Diagnosis not present

## 2021-07-13 ENCOUNTER — Other Ambulatory Visit: Payer: Self-pay

## 2021-07-13 ENCOUNTER — Ambulatory Visit: Payer: Medicare Other | Admitting: Occupational Therapy

## 2021-07-13 DIAGNOSIS — M25642 Stiffness of left hand, not elsewhere classified: Secondary | ICD-10-CM

## 2021-07-13 DIAGNOSIS — M25612 Stiffness of left shoulder, not elsewhere classified: Secondary | ICD-10-CM

## 2021-07-13 DIAGNOSIS — R278 Other lack of coordination: Secondary | ICD-10-CM

## 2021-07-13 DIAGNOSIS — M6281 Muscle weakness (generalized): Secondary | ICD-10-CM | POA: Diagnosis not present

## 2021-07-13 DIAGNOSIS — M25641 Stiffness of right hand, not elsewhere classified: Secondary | ICD-10-CM

## 2021-07-13 DIAGNOSIS — M25611 Stiffness of right shoulder, not elsewhere classified: Secondary | ICD-10-CM

## 2021-07-13 NOTE — Therapy (Signed)
[facility] [address] [place], [place], [address] Phone: [phone]   Fax:  [phone]  Occupational Therapy Treatment  Patient Details  Name: Timothy Ponce MRN: [id number] Date of Birth: 07/25/1956 Referring Provider (OT): Dr. Joelyn Oms   Encounter Date: 07/13/2021   OT End of Session - 07/13/21 1300     Visit Number 8    Number of Visits 24    Date for OT Re-Evaluation 08/29/21    Authorization - Visit Number 8    Authorization - Number of Visits 10    Progress Note Due on Visit 10    OT Start Time 5170    OT Stop Time 1315    OT Time Calculation (min) 40 min             Past Medical History:  Diagnosis Date   Cataract    Complication of anesthesia    "Awakens during surgery"   Dyspnea    ESRD (end stage renal disease) ([facility])    TTHSAT, [address]    Glaucoma    Neuropathy    hands - per patient    Past Surgical History:  Procedure Laterality Date   AMPUTATION Right 06/02/2020   Procedure: RIGHT FOOT 5TH RAY AMPUTATION;  Surgeon: Newt Minion, MD;  Location: [facility];  Service: Orthopedics;  Laterality: Right;   AV FISTULA PLACEMENT     INSERTION OF DIALYSIS CATHETER Right 12/08/2020   Procedure: INSERTION OF DIALYSIS CATHETER USING 19cm PALINDROME CHRONIC CATHETER;  Surgeon: Waynetta Sandy, MD;  Location: [facility];  Service: Vascular;  Laterality: Right;   REVISON OF ARTERIOVENOUS FISTULA Right 12/06/2020   Procedure: REVISON OF RIGHT ARM FISTULA;  Surgeon: Rosetta Posner, MD;  Location: [facility];  Service: Vascular;  Laterality: Right;    There were no vitals filed for this visit.       [facility] OT Assessment - 07/13/21 0001       Hand Function   Right Hand Grip (lbs) 14.3    Left Hand Grip (lbs) 16.7                Arm bike 6 mins level 1 for conditioning and shoulder ROM               OT Education - 07/13/21 1345     Education Details issued cane  exercises for sshoulder ROM, see pt instructions, 10 reps each, issued left hand right handed typing words for pt to practice with at home, A/ROM and P/ROM composite finger flexion bilaterally,    Person(s) Educated Patient    Methods Explanation;Demonstration;Verbal cues;Tactile cues    Comprehension Verbalized understanding;Verbal cues required;Returned demonstration;Tactile cues required              OT Short Term Goals - 07/13/21 1302       OT SHORT TERM GOAL #1   Title I with HEP    Time 4    Period Weeks    Status Achieved      OT SHORT TERM GOAL #2   Title Pt will increase bilateral grip strength by 5 lbs for increased functional use.    Time 4    Period Weeks    Status On-going   met for left hand only     OT SHORT TERM GOAL #3   Title Pt will verbalzie understanding of AE/adapted strategies to maximize safety and I with ADLS/IADLs( buttons, zippers)    Time 4  Period Weeks    Status On-going      OT SHORT TERM GOAL #4   Title Pt will increase bilateral shoulder flexion by for RUE to 115, and LUE to 120 for increased ease with ADLs.    Time 4    Period Weeks    Status On-going               OT Long Term Goals - 06/06/21 1714       OT LONG TERM GOAL #1   Title I with  HEP for grip stength    Time 12    Period Weeks    Status New    Target Date 08/29/21      OT LONG TERM GOAL #2   Title Pt will increase bilateral grip strength to at least 20 lbs for improved ease with ADLs.    Time 12    Period Weeks    Status New      OT LONG TERM GOAL #3   Title Pt will demonstrate at least 80% composite finger flexion bilaterally for improved fuction during ADLs.    Time 12    Period Weeks    Status New      OT LONG TERM GOAL #4   Title Pt will report increased ease with writing and typing for work.    Time 12    Period Weeks    Status New      OT LONG TERM GOAL #5   Title Pt will demonstrate ability to oppose all digits with thumb for left hand for  increased ease with daily activities.    Time 12    Period Weeks    Status New                   Plan - 07/13/21 1341     Clinical Impression Statement Pt is progressing towards goals. He remains limited by visual deficits and sensation. He demonstrates understanding of cane exercises for shoulder ROM.    OT Occupational Profile and History Problem Focused Assessment - Including review of records relating to presenting problem    Occupational performance deficits (Please refer to evaluation for details): ADL's;IADL's;Work;Leisure;Social Participation    Body Structure / Function / Physical Skills ADL;Balance;Strength;UE functional use;Flexibility;FMC;Vision;GMC;ROM;Decreased knowledge of use of DME;Dexterity;IADL;Sensation    Rehab Potential Good    Clinical Decision Making Several treatment options, min-mod task modification necessary    Comorbidities Affecting Occupational Performance: May have comorbidities impacting occupational performance   complex medical hx   Modification or Assistance to Complete Evaluation  Min-Moderate modification of tasks or assist with assess necessary to complete eval   visual and sensory deficits   OT Frequency 2x / week   anticipate d/c after 8 weeks, POC written for 12 to acccount for missed visits etc   OT Duration 12 weeks    OT Treatment/Interventions Self-care/ADL training;Therapeutic exercise;Ultrasound;Neuromuscular education;Manual Therapy;Splinting;Therapeutic activities;Cryotherapy;Paraffin;DME and/or AE instruction;Fluidtherapy;Contrast Bath;Moist Heat;Passive range of motion;Patient/family education    Plan pt has significant visual impairment,   be careful regarding fistula RUE at wrist - no pressure over fistula,Review cane exercises, functional use of hands pick up coins cards, checkers , putty if pt brings in, keep in mid index finger RUE triggers at times.    Consulted and Agree with Plan of Care Patient             Patient will  benefit from skilled therapeutic intervention in order to improve the following deficits and impairments:  Body Structure / Function / Physical Skills: ADL, Balance, Strength, UE functional use, Flexibility, FMC, Vision, GMC, ROM, Decreased knowledge of use of DME, Dexterity, IADL, Sensation       Visit Diagnosis: Stiffness of right hand, not elsewhere classified  Stiffness of left shoulder, not elsewhere classified  Stiffness of right shoulder, not elsewhere classified  Stiffness of left hand, not elsewhere classified  Other lack of coordination  Muscle weakness (generalized)    Problem List Patient Active Problem List   Diagnosis Date Noted   Complication of vascular dialysis catheter 12/10/2020   Encounter for removal of sutures 06/24/2020   ESRD on hemodialysis ([facility]) 05/31/2020   Anemia of chronic renal failure 05/31/2020   Peripheral arterial disease ([facility]) 05/31/2020   Osteomyelitis of fifth toe of right foot ([facility]) 05/30/2020   Diarrhea, unspecified 05/17/2020   Allergy, unspecified, initial encounter 10/04/2019   Other disorders of phosphorus metabolism 12/05/2014   Aftercare including intermittent dialysis ([facility]) 09/14/2014   Fever, unspecified 09/14/2014   Headache, unspecified 09/14/2014   Coagulation defect, unspecified ([facility]) 09/14/2014   Pruritus, unspecified 09/14/2014   Pain, unspecified 08/24/2014   Hyperkalemia 12/03/2013   Encounter for immunization 09/25/2013   Hypercalcemia 08/03/2013   Other abnormal glucose 09/25/2012   Macular corneal dystrophy 06/16/2002   Secondary hyperparathyroidism of renal origin ([facility]) 06/16/2002   Iron deficiency anemia, unspecified 09/18/2001    Xzaiver Vayda 07/13/2021, 1:52 PM  [facility] [address] [place], [place], [address] Phone: [phone]   Fax:  [phone]  Name: CANDON CARAS MRN: [id number] Date of Birth: 1956-10-26

## 2021-07-13 NOTE — Patient Instructions (Signed)
    ROM: Abduction - Wand- sitting    Holding wand with left hand palm up, push wand directly out to side, leading with other hand palm down, until stretch is felt. Hold 5 seconds. Repeat 10 times per set. Do 1-2 sessions per day. (Lying down)    [employer] - Sitting   With arms straight, hold cane forward at waist. Raise cane above head. Hold 3 seconds. Repeat 10 times. Do 2 times per day.    Repeat for low chest press in seated 10 reps 1-2 x day  Also perform biceps curls 10 reps 1-2 x per day

## 2021-07-14 DIAGNOSIS — N2581 Secondary hyperparathyroidism of renal origin: Secondary | ICD-10-CM | POA: Diagnosis not present

## 2021-07-14 DIAGNOSIS — D689 Coagulation defect, unspecified: Secondary | ICD-10-CM | POA: Diagnosis not present

## 2021-07-14 DIAGNOSIS — N186 End stage renal disease: Secondary | ICD-10-CM | POA: Diagnosis not present

## 2021-07-14 DIAGNOSIS — Z992 Dependence on renal dialysis: Secondary | ICD-10-CM | POA: Diagnosis not present

## 2021-07-14 DIAGNOSIS — D509 Iron deficiency anemia, unspecified: Secondary | ICD-10-CM | POA: Diagnosis not present

## 2021-07-16 DIAGNOSIS — I120 Hypertensive chronic kidney disease with stage 5 chronic kidney disease or end stage renal disease: Secondary | ICD-10-CM | POA: Diagnosis not present

## 2021-07-16 DIAGNOSIS — N186 End stage renal disease: Secondary | ICD-10-CM | POA: Diagnosis not present

## 2021-07-16 DIAGNOSIS — Z992 Dependence on renal dialysis: Secondary | ICD-10-CM | POA: Diagnosis not present

## 2021-07-16 DIAGNOSIS — H401133 Primary open-angle glaucoma, bilateral, severe stage: Secondary | ICD-10-CM | POA: Diagnosis not present

## 2021-07-17 DIAGNOSIS — N2581 Secondary hyperparathyroidism of renal origin: Secondary | ICD-10-CM | POA: Diagnosis not present

## 2021-07-17 DIAGNOSIS — D509 Iron deficiency anemia, unspecified: Secondary | ICD-10-CM | POA: Diagnosis not present

## 2021-07-17 DIAGNOSIS — N186 End stage renal disease: Secondary | ICD-10-CM | POA: Diagnosis not present

## 2021-07-17 DIAGNOSIS — Z992 Dependence on renal dialysis: Secondary | ICD-10-CM | POA: Diagnosis not present

## 2021-07-18 ENCOUNTER — Encounter: Payer: Self-pay | Admitting: Occupational Therapy

## 2021-07-18 ENCOUNTER — Other Ambulatory Visit: Payer: Self-pay

## 2021-07-18 ENCOUNTER — Ambulatory Visit: Payer: Medicare Other | Attending: Nephrology | Admitting: Occupational Therapy

## 2021-07-18 DIAGNOSIS — G629 Polyneuropathy, unspecified: Secondary | ICD-10-CM | POA: Diagnosis not present

## 2021-07-18 DIAGNOSIS — R278 Other lack of coordination: Secondary | ICD-10-CM | POA: Diagnosis not present

## 2021-07-18 DIAGNOSIS — M25611 Stiffness of right shoulder, not elsewhere classified: Secondary | ICD-10-CM | POA: Diagnosis not present

## 2021-07-18 DIAGNOSIS — M25641 Stiffness of right hand, not elsewhere classified: Secondary | ICD-10-CM | POA: Diagnosis not present

## 2021-07-18 DIAGNOSIS — M6281 Muscle weakness (generalized): Secondary | ICD-10-CM | POA: Diagnosis not present

## 2021-07-18 DIAGNOSIS — M25642 Stiffness of left hand, not elsewhere classified: Secondary | ICD-10-CM

## 2021-07-18 DIAGNOSIS — M25612 Stiffness of left shoulder, not elsewhere classified: Secondary | ICD-10-CM

## 2021-07-18 NOTE — Therapy (Signed)
[facility] [address] [place], [place], [address] Phone: [phone]   Fax:  [phone]  Occupational Therapy Treatment  Patient Details  Name: Timothy Ponce MRN: [id number] Date of Birth: 1956-07-23 Referring Provider (OT): Dr. Joelyn Oms   Encounter Date: 07/18/2021   OT End of Session - 07/18/21 1702     Visit Number 9    Number of Visits 24    Date for OT Re-Evaluation 08/29/21    Authorization Type Medicare, BCBS, Medicaid    Authorization - Visit Number 9    Authorization - Number of Visits 10    Progress Note Due on Visit 10    OT Start Time 1700    OT Stop Time 1745    OT Time Calculation (min) 45 min    Activity Tolerance Patient tolerated treatment well    Behavior During Therapy WFL for tasks assessed/performed             Past Medical History:  Diagnosis Date   Cataract    Complication of anesthesia    "Awakens during surgery"   Dyspnea    ESRD (end stage renal disease) ([place])    TTHSAT, [address]    Glaucoma    Neuropathy    hands - per patient    Past Surgical History:  Procedure Laterality Date   AMPUTATION Right 06/02/2020   Procedure: RIGHT FOOT 5TH RAY AMPUTATION;  Surgeon: Newt Minion, MD;  Location: [facility];  Service: Orthopedics;  Laterality: Right;   AV FISTULA PLACEMENT     INSERTION OF DIALYSIS CATHETER Right 12/08/2020   Procedure: INSERTION OF DIALYSIS CATHETER USING 19cm PALINDROME CHRONIC CATHETER;  Surgeon: Waynetta Sandy, MD;  Location: [facility];  Service: Vascular;  Laterality: Right;   REVISON OF ARTERIOVENOUS FISTULA Right 12/06/2020   Procedure: REVISON OF RIGHT ARM FISTULA;  Surgeon: Rosetta Posner, MD;  Location: [facility];  Service: Vascular;  Laterality: Right;    There were no vitals filed for this visit.   Subjective Assessment - 07/18/21 1701     Subjective  "they feel about the same"    Pertinent History Pt is a 65 y.o male with neuropathy of  UE.  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 Pt presents to occupational therapy with the following deficits: decreased strength, decreased ROM, decreased coordination, visual deficits which impedes performance of ADLs/ IADLS. Pt can benefit from skilled OT to address these deficits.    Limitations Fistula  RUE exercise use caution,  no pressure over fistula, no BP RUE    Patient Stated Goals get hands working    Currently in Pain? No/denies                          OT Treatments/Exercises (OP) - 07/18/21 1704       ADLs   UB Dressing pt reports unable to fasten or zip shirts and jackets. OT previously gave patient information on adapted equipment for increasing independence and ease for UB dressing      Exercises   Exercises Hand      Hand Exercises   Other Hand Exercises finger walks to bunch up pillow case with BUE x 3 each.    Other Hand Exercises self PROM with thumb opposition on LUE      Fine Motor Coordination (Hand/Wrist)  Fine Motor Coordination Large Pegboard    Large Pegboard BUE for coordination with grasping and holding large easy grip pegs and placing in pegboard. Pt limited by visual deficits but was able to place pegs in holes with min difficulty. Pt with mod difficulty with grasping objects d/t sensation deficits and limited ROM. Pt had increased ease with task as it went on.                      OT Short Term Goals - 07/13/21 1302       OT SHORT TERM GOAL #1   Title I with HEP    Time 4    Period Weeks    Status Achieved      OT SHORT TERM GOAL #2   Title Pt will increase bilateral grip strength by 5 lbs for increased functional use.    Time 4    Period Weeks    Status On-going   met for left hand only     OT SHORT TERM GOAL #3   Title Pt will verbalzie understanding of AE/adapted strategies to maximize  safety and I with ADLS/IADLs( buttons, zippers)    Time 4    Period Weeks    Status Partially Met   has zipper pull rings, unable to fasten buttons/ snaps     OT SHORT TERM GOAL #4   Title Pt will increase bilateral shoulder flexion by for RUE to 115, and LUE to 120 for increased ease with ADLs.    Time 4    Period Weeks    Status On-going               OT Long Term Goals - 07/13/21 1354       OT LONG TERM GOAL #1   Title I with  HEP for grip stength    Time 12    Period Weeks    Status On-going      OT LONG TERM GOAL #2   Title Pt will increase bilateral grip strength to at least 20 lbs for improved ease with ADLs.    Time 12    Period Weeks    Status On-going      OT LONG TERM GOAL #3   Title Pt will demonstrate at least 80% composite finger flexion bilaterally for improved fuction during ADLs.    Time 12    Period Weeks    Status On-going      OT LONG TERM GOAL #4   Title Pt will report increased ease with writing and typing for work.    Time 12    Period Weeks    Status On-going      OT LONG TERM GOAL #5   Title Pt will demonstrate ability to oppose all digits with thumb for left hand for increased ease with daily activities.    Time 12    Period Weeks    Status On-going                   Plan - 07/18/21 1732     Clinical Impression Statement Pt is progressing towards goals. He remains limited by visual deficits and sensation. .    OT Occupational Profile and History Problem Focused Assessment - Including review of records relating to presenting problem    Occupational performance deficits (Please refer to evaluation for details): ADL's;IADL's;Work;Leisure;Social Participation    Body Structure / Function / Physical Skills ADL;Balance;Strength;UE functional use;Flexibility;FMC;Vision;GMC;ROM;Decreased knowledge of use of DME;Dexterity;IADL;Sensation  Rehab Potential Good    Clinical Decision Making Several treatment options, min-mod task  modification necessary    Comorbidities Affecting Occupational Performance: May have comorbidities impacting occupational performance   complex medical hx   Modification or Assistance to Complete Evaluation  Min-Moderate modification of tasks or assist with assess necessary to complete eval   visual and sensory deficits   OT Frequency 2x / week   anticipate d/c after 8 weeks, POC written for 12 to acccount for missed visits etc   OT Duration 12 weeks    OT Treatment/Interventions Self-care/ADL training;Therapeutic exercise;Ultrasound;Neuromuscular education;Manual Therapy;Splinting;Therapeutic activities;Cryotherapy;Paraffin;DME and/or AE instruction;Fluidtherapy;Contrast Bath;Moist Heat;Passive range of motion;Patient/family education    Plan pt has significant visual impairment,   be careful regarding fistula RUE at wrist - no pressure over fistula,   Review cane exercises, functional use of hands, handwriting with grip,  pick up coins cards, checkers , putty if pt brings in, keep in mind index finger RUE triggers at times.    Consulted and Agree with Plan of Care Patient             Patient will benefit from skilled therapeutic intervention in order to improve the following deficits and impairments:   Body Structure / Function / Physical Skills: ADL, Balance, Strength, UE functional use, Flexibility, FMC, Vision, GMC, ROM, Decreased knowledge of use of DME, Dexterity, IADL, Sensation       Visit Diagnosis: Stiffness of right hand, not elsewhere classified  Stiffness of left shoulder, not elsewhere classified  Stiffness of right shoulder, not elsewhere classified  Stiffness of left hand, not elsewhere classified  Muscle weakness (generalized)  Other lack of coordination    Problem List Patient Active Problem List   Diagnosis Date Noted   Complication of vascular dialysis catheter 12/10/2020   Encounter for removal of sutures 06/24/2020   ESRD on hemodialysis ([place] [facility])  05/31/2020   Anemia of chronic renal failure 05/31/2020   Peripheral arterial disease ([facility]) 05/31/2020   Osteomyelitis of fifth toe of right foot ([facility]) 05/30/2020   Diarrhea, unspecified 05/17/2020   Allergy, unspecified, initial encounter 10/04/2019   Other disorders of phosphorus metabolism 12/05/2014   Aftercare including intermittent dialysis ([facility] [place]) 09/14/2014   Fever, unspecified 09/14/2014   Headache, unspecified 09/14/2014   Coagulation defect, unspecified ([facility]) 09/14/2014   Pruritus, unspecified 09/14/2014   Pain, unspecified 08/24/2014   Hyperkalemia 12/03/2013   Encounter for immunization 09/25/2013   Hypercalcemia 08/03/2013   Other abnormal glucose 09/25/2012   Macular corneal dystrophy 06/16/2002   Secondary hyperparathyroidism of renal origin ([facility]) 06/16/2002   Iron deficiency anemia, unspecified 09/18/2001    Zachery Conch MOT, OTR/L  07/18/2021, 5:47 PM  [facility] [address] [place], [place], [address] Phone: [phone]   Fax:  [phone]  Name: Timothy Ponce MRN: [id number] Date of Birth: 20-Feb-1956

## 2021-07-19 DIAGNOSIS — N2581 Secondary hyperparathyroidism of renal origin: Secondary | ICD-10-CM | POA: Diagnosis not present

## 2021-07-19 DIAGNOSIS — N186 End stage renal disease: Secondary | ICD-10-CM | POA: Diagnosis not present

## 2021-07-19 DIAGNOSIS — D509 Iron deficiency anemia, unspecified: Secondary | ICD-10-CM | POA: Diagnosis not present

## 2021-07-19 DIAGNOSIS — Z992 Dependence on renal dialysis: Secondary | ICD-10-CM | POA: Diagnosis not present

## 2021-07-20 ENCOUNTER — Ambulatory Visit: Payer: Medicare Other | Admitting: Occupational Therapy

## 2021-07-20 ENCOUNTER — Other Ambulatory Visit: Payer: Self-pay

## 2021-07-20 DIAGNOSIS — M25642 Stiffness of left hand, not elsewhere classified: Secondary | ICD-10-CM

## 2021-07-20 DIAGNOSIS — M25611 Stiffness of right shoulder, not elsewhere classified: Secondary | ICD-10-CM

## 2021-07-20 DIAGNOSIS — M25641 Stiffness of right hand, not elsewhere classified: Secondary | ICD-10-CM | POA: Diagnosis not present

## 2021-07-20 DIAGNOSIS — M6281 Muscle weakness (generalized): Secondary | ICD-10-CM | POA: Diagnosis not present

## 2021-07-20 DIAGNOSIS — R278 Other lack of coordination: Secondary | ICD-10-CM

## 2021-07-20 DIAGNOSIS — M25612 Stiffness of left shoulder, not elsewhere classified: Secondary | ICD-10-CM | POA: Diagnosis not present

## 2021-07-20 NOTE — Therapy (Signed)
[facility] [address] [place], [place], [address] Phone: [phone]   Fax:  [phone]  Occupational Therapy Treatment  Patient Details  Name: Timothy Ponce MRN: [id number] Date of Birth: Dec 01, 1956 Referring Provider (OT): Dr. Joelyn Oms   Encounter Date: 07/20/2021   OT End of Session - 07/20/21 1308     Visit Number 10    Number of Visits 24    Date for OT Re-Evaluation 08/29/21    Authorization Type Medicare, BCBS, Medicaid    Authorization - Visit Number 10    Authorization - Number of Visits 10    Progress Note Due on Visit 10    OT Start Time 9924    OT Stop Time 1315    OT Time Calculation (min) 40 min    Activity Tolerance Patient tolerated treatment well    Behavior During Therapy WFL for tasks assessed/performed             Past Medical History:  Diagnosis Date   Cataract    Complication of anesthesia    "Awakens during surgery"   Dyspnea    ESRD (end stage renal disease) ([facility])    TTHSAT, [address]    Glaucoma    Neuropathy    hands - per patient    Past Surgical History:  Procedure Laterality Date   AMPUTATION Right 06/02/2020   Procedure: RIGHT FOOT 5TH RAY AMPUTATION;  Surgeon: Newt Minion, MD;  Location: [facility];  Service: Orthopedics;  Laterality: Right;   AV FISTULA PLACEMENT     INSERTION OF DIALYSIS CATHETER Right 12/08/2020   Procedure: INSERTION OF DIALYSIS CATHETER USING 19cm PALINDROME CHRONIC CATHETER;  Surgeon: Waynetta Sandy, MD;  Location: [facility];  Service: Vascular;  Laterality: Right;   REVISON OF ARTERIOVENOUS FISTULA Right 12/06/2020   Procedure: REVISON OF RIGHT ARM FISTULA;  Surgeon: Rosetta Posner, MD;  Location: [facility];  Service: Vascular;  Laterality: Right;    There were no vitals filed for this visit.   Subjective Assessment - 07/20/21 1347     Subjective  Pt reports he returns to work the week of August 15    Pertinent History Pt is a 65  y.o male with neuropathy of UE.  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 Pt presents to occupational therapy with the following deficits: decreased strength, decreased ROM, decreased coordination, visual deficits which impedes performance of ADLs/ IADLS. Pt can benefit from skilled OT to address these deficits.    Limitations Fistula  RUE exercise use caution,  no pressure over fistula, no BP RUE    Patient Stated Goals get hands working    Currently in Pain? No/denies                   Treatment:Hotpack applied on left hand x 10 mins while therapist performed passive stretch in finger and thumb flexion for LUE. A/ROM finger thumb opposition to digits 2,3. Hot pack applied to left hand while therapist performed P/ROM to right hand followed by A/ROM finger thumb opposition to all digits. No adverse reactions to heat.  Functional grasp/ release of wooden dowel pegs with right and left UE's to remove pegs from pegboard then replace with emphais on tip and 3 pt pinch as able. Pt was provided with information regarding an adapted button front shirt with magnets. Pt verbalized understanding.  OT Short Term Goals - 07/20/21 1242       OT SHORT TERM GOAL #1   Title I with HEP    Time 4    Period Weeks    Status Achieved      OT SHORT TERM GOAL #2   Title Pt will increase bilateral grip strength by 5 lbs for increased functional use.    Time 4    Period Weeks    Status On-going   met for left hand only     OT SHORT TERM GOAL #3   Title Pt will verbalzie understanding of AE/adapted strategies to maximize safety and I with ADLS/IADLs( buttons, zippers)    Time 4    Period Weeks    Status On-going   met for buttons/ zippers,.has info regarding  zipper pull rings, pt was provided with information regarding magnetic buttons for shirt,  pt  may benefit from adaptations for work activities     OT [place] #4   Title Pt will increase bilateral shoulder flexion by for RUE to 115, and LUE to 120 for increased ease with ADLs.    Time 4    Period Weeks    Status On-going               OT Long Term Goals - 07/20/21 1242       OT LONG TERM GOAL #1   Title I with  HEP for grip stength    Time 12    Period Weeks    Status On-going      OT LONG TERM GOAL #2   Title Pt will increase bilateral grip strength to at least 20 lbs for improved ease with ADLs.    Time 12    Period Weeks    Status On-going      OT LONG TERM GOAL #3   Title Pt will demonstrate at least 80% composite finger flexion bilaterally for improved fuction during ADLs.    Time 12    Period Weeks    Status On-going      OT LONG TERM GOAL #4   Title Pt will report increased ease with writing and typing for work.    Time 12    Period Weeks    Status On-going      OT LONG TERM GOAL #5   Title Pt will demonstrate ability to oppose all digits with thumb for left hand for increased ease with daily activities.    Time 12    Period Weeks    Status On-going                   Plan - 07/20/21 1333     Clinical Impression Statement For the reporting period of 06/06/21- 07/20/21 pt demonstrates progress towards goals. However he has not fully achieved short term goals due to decreased dexterity, and sesation as well as significant visual impairments. Pt will benefit from continued skilled occupational therapy to address: strength, dexterity, UE functional use, coordination  in order to maximize pt's safety and I ADLs/ IADLs. Pt demonstrates improved functional use of UE's today. Pt responded well to hotpack for improved flexibility and ROM. Therapist monitored closely due to pt's sensory deficits.    OT Occupational Profile and History Problem Focused Assessment - Including review of records relating to presenting problem    Occupational  performance deficits (Please refer to evaluation for details): ADL's;IADL's;Work;Leisure;Social Participation    Body Structure / Function / Physical Skills ADL;Balance;Strength;UE  functional use;Flexibility;FMC;Vision;GMC;ROM;Decreased knowledge of use of DME;Dexterity;IADL;Sensation    Rehab Potential Good    Clinical Decision Making Several treatment options, min-mod task modification necessary    Comorbidities Affecting Occupational Performance: May have comorbidities impacting occupational performance   complex medical hx   Modification or Assistance to Complete Evaluation  Min-Moderate modification of tasks or assist with assess necessary to complete eval   visual and sensory deficits   OT Frequency 2x / week   anticipate d/c after 8 weeks, POC written for 12 to acccount for missed visits etc   OT Duration 12 weeks    OT Treatment/Interventions Self-care/ADL training;Therapeutic exercise;Ultrasound;Neuromuscular education;Manual Therapy;Splinting;Therapeutic activities;Cryotherapy;Paraffin;DME and/or AE instruction;Fluidtherapy;Contrast Bath;Moist Heat;Passive range of motion;Patient/family education    Plan pt has significant visual impairment,   be careful regarding fistula RUE at wrist - no pressure over fistula,   Review cane exercises, functional use of hands, activities in prep for return to work    [employer] and Agree with Plan of Care Patient             Patient will benefit from skilled therapeutic intervention in order to improve the following deficits and impairments:   Body Structure / Function / Physical Skills: ADL, Balance, Strength, UE functional use, Flexibility, FMC, Vision, GMC, ROM, Decreased knowledge of use of DME, Dexterity, IADL, Sensation       Visit Diagnosis: Stiffness of right hand, not elsewhere classified  Stiffness of left shoulder, not elsewhere classified  Stiffness of right shoulder, not elsewhere classified  Stiffness of left hand, not elsewhere  classified  Muscle weakness (generalized)  Other lack of coordination    Problem List Patient Active Problem List   Diagnosis Date Noted   Complication of vascular dialysis catheter 12/10/2020   Encounter for removal of sutures 06/24/2020   ESRD on hemodialysis ([facility]) 05/31/2020   Anemia of chronic renal failure 05/31/2020   Peripheral arterial disease ([facility]) 05/31/2020   Osteomyelitis of fifth toe of right foot ([facility]) 05/30/2020   Diarrhea, unspecified 05/17/2020   Allergy, unspecified, initial encounter 10/04/2019   Other disorders of phosphorus metabolism 12/05/2014   Aftercare including intermittent dialysis ([facility]) 09/14/2014   Fever, unspecified 09/14/2014   Headache, unspecified 09/14/2014   Coagulation defect, unspecified ([facility]) 09/14/2014   Pruritus, unspecified 09/14/2014   Pain, unspecified 08/24/2014   Hyperkalemia 12/03/2013   Encounter for immunization 09/25/2013   Hypercalcemia 08/03/2013   Other abnormal glucose 09/25/2012   Macular corneal dystrophy 06/16/2002   Secondary hyperparathyroidism of renal origin ([facility]) 06/16/2002   Iron deficiency anemia, unspecified 09/18/2001    Shey Bartmess 07/20/2021, 1:48 PM  [facility] [address] [place], [place], [address] Phone: [phone]   Fax:  [phone]  Name: SYDNEY AZURE MRN: [id number] Date of Birth: Aug 05, 1956

## 2021-07-21 DIAGNOSIS — N186 End stage renal disease: Secondary | ICD-10-CM | POA: Diagnosis not present

## 2021-07-21 DIAGNOSIS — N2581 Secondary hyperparathyroidism of renal origin: Secondary | ICD-10-CM | POA: Diagnosis not present

## 2021-07-21 DIAGNOSIS — D509 Iron deficiency anemia, unspecified: Secondary | ICD-10-CM | POA: Diagnosis not present

## 2021-07-21 DIAGNOSIS — Z992 Dependence on renal dialysis: Secondary | ICD-10-CM | POA: Diagnosis not present

## 2021-07-23 DIAGNOSIS — M79642 Pain in left hand: Secondary | ICD-10-CM | POA: Diagnosis not present

## 2021-07-23 DIAGNOSIS — M79641 Pain in right hand: Secondary | ICD-10-CM | POA: Diagnosis not present

## 2021-07-23 DIAGNOSIS — R2232 Localized swelling, mass and lump, left upper limb: Secondary | ICD-10-CM | POA: Diagnosis not present

## 2021-07-23 DIAGNOSIS — R200 Anesthesia of skin: Secondary | ICD-10-CM | POA: Diagnosis not present

## 2021-07-24 ENCOUNTER — Other Ambulatory Visit: Payer: Self-pay | Admitting: Orthopedic Surgery

## 2021-07-24 DIAGNOSIS — R2232 Localized swelling, mass and lump, left upper limb: Secondary | ICD-10-CM

## 2021-07-24 DIAGNOSIS — N2581 Secondary hyperparathyroidism of renal origin: Secondary | ICD-10-CM | POA: Diagnosis not present

## 2021-07-24 DIAGNOSIS — N186 End stage renal disease: Secondary | ICD-10-CM | POA: Diagnosis not present

## 2021-07-24 DIAGNOSIS — D509 Iron deficiency anemia, unspecified: Secondary | ICD-10-CM | POA: Diagnosis not present

## 2021-07-24 DIAGNOSIS — Z992 Dependence on renal dialysis: Secondary | ICD-10-CM | POA: Diagnosis not present

## 2021-07-25 ENCOUNTER — Encounter: Payer: Medicare Other | Admitting: Occupational Therapy

## 2021-07-25 ENCOUNTER — Other Ambulatory Visit: Payer: Self-pay

## 2021-07-25 ENCOUNTER — Ambulatory Visit: Payer: Medicare Other | Admitting: Occupational Therapy

## 2021-07-25 ENCOUNTER — Encounter: Payer: Self-pay | Admitting: Occupational Therapy

## 2021-07-25 DIAGNOSIS — M25611 Stiffness of right shoulder, not elsewhere classified: Secondary | ICD-10-CM | POA: Diagnosis not present

## 2021-07-25 DIAGNOSIS — M25642 Stiffness of left hand, not elsewhere classified: Secondary | ICD-10-CM

## 2021-07-25 DIAGNOSIS — M6281 Muscle weakness (generalized): Secondary | ICD-10-CM | POA: Diagnosis not present

## 2021-07-25 DIAGNOSIS — M25641 Stiffness of right hand, not elsewhere classified: Secondary | ICD-10-CM

## 2021-07-25 DIAGNOSIS — R278 Other lack of coordination: Secondary | ICD-10-CM

## 2021-07-25 DIAGNOSIS — M25612 Stiffness of left shoulder, not elsewhere classified: Secondary | ICD-10-CM | POA: Diagnosis not present

## 2021-07-25 NOTE — Therapy (Signed)
[facility] [address] [place], [place], [address] Phone: [phone]   Fax:  [phone]  Occupational Therapy Treatment  Patient Details  Name: Timothy Ponce MRN: [id number] Date of Birth: 12-29-1955 Referring Provider (OT): Dr. Joelyn Oms   Encounter Date: 07/25/2021   OT End of Session - 07/25/21 1534     Visit Number 11    Number of Visits 24    Date for OT Re-Evaluation 08/29/21    Authorization Type Medicare, BCBS, Medicaid    Authorization - Visit Number 11    Authorization - Number of Visits 10    Progress Note Due on Visit 71    OT Start Time 1530    OT Stop Time 1615    OT Time Calculation (min) 45 min    Activity Tolerance Patient tolerated treatment well    Behavior During Therapy WFL for tasks assessed/performed             Past Medical History:  Diagnosis Date   Cataract    Complication of anesthesia    "Awakens during surgery"   Dyspnea    ESRD (end stage renal disease) ([facility])    TTHSAT, [address]    Glaucoma    Neuropathy    hands - per patient    Past Surgical History:  Procedure Laterality Date   AMPUTATION Right 06/02/2020   Procedure: RIGHT FOOT 5TH RAY AMPUTATION;  Surgeon: Newt Minion, MD;  Location: [facility];  Service: Orthopedics;  Laterality: Right;   AV FISTULA PLACEMENT     INSERTION OF DIALYSIS CATHETER Right 12/08/2020   Procedure: INSERTION OF DIALYSIS CATHETER USING 19cm PALINDROME CHRONIC CATHETER;  Surgeon: Waynetta Sandy, MD;  Location: [facility];  Service: Vascular;  Laterality: Right;   REVISON OF ARTERIOVENOUS FISTULA Right 12/06/2020   Procedure: REVISON OF RIGHT ARM FISTULA;  Surgeon: Rosetta Posner, MD;  Location: [facility];  Service: Vascular;  Laterality: Right;    There were no vitals filed for this visit.   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 I have a retreat tomorrow  but that's my dialysis day and I am going to have to skip dialysis."    Pertinent History Pt is a 65 y.o male with neuropathy of UE.  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 Pt presents to occupational therapy with the following deficits: decreased strength, decreased ROM, decreased coordination, visual deficits which impedes performance of ADLs/ IADLS. Pt can benefit from skilled OT to address these deficits.    Limitations Fistula  RUE exercise use caution,  no pressure over fistula, no BP RUE    Patient Stated Goals get hands working    Currently in Pain? No/denies                          OT Treatments/Exercises (OP) - 07/25/21 1531       ADLs   Work "my biggest challenge is going to be my vision - it is worse than it was at the end of the semester." Pt reports concern for brightness level of the building he is going to be in.    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      Fine Motor Coordination (Hand/Wrist)   Fine Motor Coordination Large Pegboard    Large Pegboard semicircle peg board - pt placed white and red and grey pegs in holes with RUE for increase in grasp and release and coordination. removed pegs with LUE.                      OT Short Term Goals - 07/20/21 1242       OT SHORT TERM GOAL #1   Title I with HEP    Time 4    Period Weeks    Status Achieved      OT SHORT TERM GOAL #2   Title Pt will increase bilateral grip strength by 5 lbs for increased functional use.    Time 4    Period Weeks    Status On-going   met for left hand only     OT SHORT TERM GOAL  #3   Title Pt will verbalzie understanding of AE/adapted strategies to maximize safety and I with ADLS/IADLs( buttons, zippers)    Time 4    Period Weeks    Status On-going   met for buttons/ zippers,.has info regarding  zipper pull rings, pt was provided with information regarding magnetic buttons for shirt,  pt may benefit from adaptations for work activities     OT [place] #4   Title Pt will increase bilateral shoulder flexion by for RUE to 115, and LUE to 120 for increased ease with ADLs.    Time 4    Period Weeks    Status On-going               OT Long Term Goals - 07/20/21 1242       OT LONG TERM GOAL #1   Title I with  HEP for grip stength    Time 12    Period Weeks    Status On-going      OT LONG TERM GOAL #2   Title Pt will increase bilateral grip strength to at least 20 lbs for improved ease with ADLs.    Time 12    Period Weeks    Status On-going      OT LONG TERM GOAL #3   Title Pt will demonstrate at least 80% composite finger flexion bilaterally for improved fuction during ADLs.    Time 12    Period Weeks    Status On-going      OT LONG TERM GOAL #4   Title Pt will report increased ease with writing and typing for work.    Time 12    Period Weeks    Status On-going      OT LONG TERM GOAL #5   Title Pt will demonstrate ability to oppose all digits with thumb for left hand for increased ease with daily activities.    Time 12    Period Weeks    Status On-going                   Plan - 07/25/21 1623     Clinical Impression Statement Pt progressing towards unmet goals. Pt working towards increasing ability to participate with work activities and increasing independence in ADLs and IADLs.    OT Occupational Profile and History  Problem Focused Assessment - Including review of records relating to presenting problem    Occupational performance deficits (Please refer to evaluation for details): ADL's;IADL's;Work;Leisure;Social  Participation    Body Structure / Function / Physical Skills ADL;Balance;Strength;UE functional use;Flexibility;FMC;Vision;GMC;ROM;Decreased knowledge of use of DME;Dexterity;IADL;Sensation    Rehab Potential Good    Clinical Decision Making Several treatment options, min-mod task modification necessary    Comorbidities Affecting Occupational Performance: May have comorbidities impacting occupational performance   complex medical hx   Modification or Assistance to Complete Evaluation  Min-Moderate modification of tasks or assist with assess necessary to complete eval   visual and sensory deficits   OT Frequency 2x / week   anticipate d/c after 8 weeks, POC written for 12 to acccount for missed visits etc   OT Duration 12 weeks    OT Treatment/Interventions Self-care/ADL training;Therapeutic exercise;Ultrasound;Neuromuscular education;Manual Therapy;Splinting;Therapeutic activities;Cryotherapy;Paraffin;DME and/or AE instruction;Fluidtherapy;Contrast Bath;Moist Heat;Passive range of motion;Patient/family education    Plan pt has significant visual impairment,   be careful regarding fistula RUE at wrist - no pressure over fistula,   Review cane exercises, functional use of hands, activities in prep for return to work, splint for key to increase independence    Consulted and Agree with Plan of Care Patient             Patient will benefit from skilled therapeutic intervention in order to improve the following deficits and impairments:   Body Structure / Function / Physical Skills: ADL, Balance, Strength, UE functional use, Flexibility, FMC, Vision, GMC, ROM, Decreased knowledge of use of DME, Dexterity, IADL, Sensation       Visit Diagnosis: Stiffness of right hand, not elsewhere classified  Stiffness of left shoulder, not elsewhere classified  Stiffness of right shoulder, not elsewhere classified  Stiffness of left hand, not elsewhere classified  Muscle weakness (generalized)  Other  lack of coordination    Problem List Patient Active Problem List   Diagnosis Date Noted   Complication of vascular dialysis catheter 12/10/2020   Encounter for removal of sutures 06/24/2020   ESRD on hemodialysis ([facility]) 05/31/2020   Anemia of chronic renal failure 05/31/2020   Peripheral arterial disease ([facility]) 05/31/2020   Osteomyelitis of fifth toe of right foot ([facility]) 05/30/2020   Diarrhea, unspecified 05/17/2020   Allergy, unspecified, initial encounter 10/04/2019   Other disorders of phosphorus metabolism 12/05/2014   Aftercare including intermittent dialysis ([facility]) 09/14/2014   Fever, unspecified 09/14/2014   Headache, unspecified 09/14/2014   Coagulation defect, unspecified ([facility]) 09/14/2014   Pruritus, unspecified 09/14/2014   Pain, unspecified 08/24/2014   Hyperkalemia 12/03/2013   Encounter for immunization 09/25/2013   Hypercalcemia 08/03/2013   Other abnormal glucose 09/25/2012   Macular corneal dystrophy 06/16/2002   Secondary hyperparathyroidism of renal origin ([facility]) 06/16/2002   Iron deficiency anemia, unspecified 09/18/2001    Zachery Conch MOT, OTR/L  07/25/2021, 4:25 PM  [facility] [address] [place], [place], [address] Phone: [phone]   Fax:  [phone]  Name: Timothy Ponce MRN: [id number] Date of Birth: February 17, 1956

## 2021-07-27 ENCOUNTER — Encounter: Payer: Medicare Other | Admitting: Occupational Therapy

## 2021-07-28 DIAGNOSIS — D509 Iron deficiency anemia, unspecified: Secondary | ICD-10-CM | POA: Diagnosis not present

## 2021-07-28 DIAGNOSIS — Z992 Dependence on renal dialysis: Secondary | ICD-10-CM | POA: Diagnosis not present

## 2021-07-28 DIAGNOSIS — N186 End stage renal disease: Secondary | ICD-10-CM | POA: Diagnosis not present

## 2021-07-28 DIAGNOSIS — N2581 Secondary hyperparathyroidism of renal origin: Secondary | ICD-10-CM | POA: Diagnosis not present

## 2021-07-31 DIAGNOSIS — N186 End stage renal disease: Secondary | ICD-10-CM | POA: Diagnosis not present

## 2021-07-31 DIAGNOSIS — D509 Iron deficiency anemia, unspecified: Secondary | ICD-10-CM | POA: Diagnosis not present

## 2021-07-31 DIAGNOSIS — Z992 Dependence on renal dialysis: Secondary | ICD-10-CM | POA: Diagnosis not present

## 2021-07-31 DIAGNOSIS — N2581 Secondary hyperparathyroidism of renal origin: Secondary | ICD-10-CM | POA: Diagnosis not present

## 2021-08-01 ENCOUNTER — Other Ambulatory Visit: Payer: Self-pay

## 2021-08-01 ENCOUNTER — Encounter: Payer: Medicare Other | Admitting: Occupational Therapy

## 2021-08-01 ENCOUNTER — Ambulatory Visit: Payer: Medicare Other | Admitting: Occupational Therapy

## 2021-08-01 ENCOUNTER — Encounter: Payer: Self-pay | Admitting: Occupational Therapy

## 2021-08-01 DIAGNOSIS — M25611 Stiffness of right shoulder, not elsewhere classified: Secondary | ICD-10-CM | POA: Diagnosis not present

## 2021-08-01 DIAGNOSIS — M25612 Stiffness of left shoulder, not elsewhere classified: Secondary | ICD-10-CM

## 2021-08-01 DIAGNOSIS — M6281 Muscle weakness (generalized): Secondary | ICD-10-CM

## 2021-08-01 DIAGNOSIS — M25642 Stiffness of left hand, not elsewhere classified: Secondary | ICD-10-CM

## 2021-08-01 DIAGNOSIS — M25641 Stiffness of right hand, not elsewhere classified: Secondary | ICD-10-CM

## 2021-08-01 DIAGNOSIS — R278 Other lack of coordination: Secondary | ICD-10-CM

## 2021-08-01 NOTE — Therapy (Signed)
[facility] [address] [place], [place], [address] Phone: [phone]   Fax:  [phone]  Occupational Therapy Treatment  Patient Details  Name: Timothy Ponce MRN: [id number] Date of Birth: [birth year] Referring Provider (OT): Dr. Joelyn Oms   Encounter Date: 08/01/2021   OT End of Session - 08/01/21 1530     Visit Number 12    Number of Visits 24    Date for OT Re-Evaluation 08/29/21    Authorization Type Medicare, BCBS, Medicaid    Authorization - Visit Number 12    Authorization - Number of Visits 10    Progress Note Due on Visit 20    OT Start Time 1529    OT Stop Time 1615    OT Time Calculation (min) 46 min    Activity Tolerance Patient tolerated treatment well    Behavior During Therapy WFL for tasks assessed/performed             Past Medical History:  Diagnosis Date   Cataract    Complication of anesthesia    "Awakens during surgery"   Dyspnea    ESRD (end stage renal disease) ([facility])    TTHSAT, [address]    Glaucoma    Neuropathy    hands - per patient    Past Surgical History:  Procedure Laterality Date   AMPUTATION Right 06/02/2020   Procedure: RIGHT FOOT 5TH RAY AMPUTATION;  Surgeon: Newt Minion, MD;  Location: [facility];  Service: Orthopedics;  Laterality: Right;   AV FISTULA PLACEMENT     INSERTION OF DIALYSIS CATHETER Right 12/08/2020   Procedure: INSERTION OF DIALYSIS CATHETER USING 19cm PALINDROME CHRONIC CATHETER;  Surgeon: Waynetta Sandy, MD;  Location: [facility];  Service: Vascular;  Laterality: Right;   REVISON OF ARTERIOVENOUS FISTULA Right 12/06/2020   Procedure: REVISON OF RIGHT ARM FISTULA;  Surgeon: Rosetta Posner, MD;  Location: [facility];  Service: Vascular;  Laterality: Right;    There were no vitals filed for this visit.   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 Pt presents to occupational therapy with the following deficits: decreased strength, decreased ROM, decreased coordination, visual deficits which impedes performance of ADLs/ IADLS. Pt can benefit from skilled OT to address these deficits.    Limitations Fistula  RUE exercise use caution,  no pressure over fistula, no BP RUE    Patient Stated Goals get hands working    Currently in Pain? 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 Pt expressed difficulty with using mouse for copying and pasting questions for generating quizzes, etc. Discussed Ctrl functions for increasing ease with using keyboard vs mouse for these functions.    ADL Comments used splint material to apply to office key to assist patient with locking  and unlocking office door to build up key to T shape.      Hand Exercises   Other Hand Exercises PROM (self) for composite flexion      Fine Motor Coordination (Hand/Wrist)   Fine Motor Coordination Tracing;Handwriting    Large Pegboard color cylindrical dowels of various sizes and placing into board and removing with good coordination with BUE. Pt demonstrated increased ease with task this day.    Handwriting writing name/signing name with deviation inferior of line    Tracing tracing large circles on paper with weighted pen - pt deviated inferiorly from tracing lines. deficits d/t vision not necessarily coordination with RUE                      OT Short Term Goals - 07/20/21 1242       OT SHORT TERM GOAL #1    Title I with HEP    Time 4    Period Weeks    Status Achieved      OT SHORT TERM GOAL #2   Title Pt will increase bilateral grip strength by 5 lbs for increased functional use.    Time 4    Period Weeks    Status On-going   met for left hand only     OT SHORT TERM GOAL #3   Title Pt will verbalzie understanding of AE/adapted strategies to maximize safety and I with ADLS/IADLs( buttons, zippers)    Time 4    Period Weeks    Status On-going   met for buttons/ zippers,.has info regarding  zipper pull rings, pt was provided with information regarding magnetic buttons for shirt,  pt may benefit from adaptations for work activities     OT [place] #4   Title Pt will increase bilateral shoulder flexion by for RUE to 115, and LUE to 120 for increased ease with ADLs.    Time 4    Period Weeks    Status On-going               OT Long Term Goals - 07/20/21 1242       OT LONG TERM GOAL #1   Title I with  HEP for grip stength    Time 12    Period Weeks    Status On-going      OT LONG TERM GOAL #2   Title Pt will increase bilateral grip strength to at least 20 lbs for improved ease with ADLs.    Time 12    Period Weeks    Status On-going      OT LONG TERM GOAL #3   Title Pt will demonstrate at least 80% composite finger flexion bilaterally for improved fuction during ADLs.    Time 12    Period Weeks    Status On-going      OT LONG TERM GOAL #4   Title Pt will report increased ease with writing and typing for work.    Time 12    Period Weeks    Status On-going      OT LONG TERM GOAL #5   Title Pt will demonstrate ability to oppose all digits with thumb for left hand for increased ease with daily activities.    Time 12    Period Weeks    Status On-going                   Plan - 08/01/21 1628     Clinical Impression  Statement Pt progressing towards goals. Pt issued adapted splint T for key and ctrl keys for word processing to assist with work related  tasks    OT Occupational Profile and History Problem Focused Assessment - Including review of records relating to presenting problem    Occupational performance deficits (Please refer to evaluation for details): ADL's;IADL's;Work;Leisure;Social Participation    Body Structure / Function / Physical Skills ADL;Balance;Strength;UE functional use;Flexibility;FMC;Vision;GMC;ROM;Decreased knowledge of use of DME;Dexterity;IADL;Sensation    Rehab Potential Good    Clinical Decision Making Several treatment options, min-mod task modification necessary    Comorbidities Affecting Occupational Performance: May have comorbidities impacting occupational performance   complex medical hx   Modification or Assistance to Complete Evaluation  Min-Moderate modification of tasks or assist with assess necessary to complete eval   visual and sensory deficits   OT Frequency 2x / week   anticipate d/c after 8 weeks, POC written for 12 to acccount for missed visits etc   OT Duration 12 weeks    OT Treatment/Interventions Self-care/ADL training;Therapeutic exercise;Ultrasound;Neuromuscular education;Manual Therapy;Splinting;Therapeutic activities;Cryotherapy;Paraffin;DME and/or AE instruction;Fluidtherapy;Contrast Bath;Moist Heat;Passive range of motion;Patient/family education    Plan pt has significant visual impairment,   be careful regarding fistula RUE at wrist - no pressure over fistula,   Review cane exercises, functional use of hands, activities in prep for return to work, splint for key to increase independence    Consulted and Agree with Plan of Care Patient             Patient will benefit from skilled therapeutic intervention in order to improve the following deficits and impairments:   Body Structure / Function / Physical Skills: ADL, Balance, Strength, UE functional use, Flexibility, FMC, Vision, GMC, ROM, Decreased knowledge of use of DME, Dexterity, IADL, Sensation       Visit Diagnosis: Stiffness  of right hand, not elsewhere classified  Stiffness of left shoulder, not elsewhere classified  Stiffness of right shoulder, not elsewhere classified  Stiffness of left hand, not elsewhere classified  Muscle weakness (generalized)  Other lack of coordination    Problem List Patient Active Problem List   Diagnosis Date Noted   Complication of vascular dialysis catheter 12/10/2020   Encounter for removal of sutures 06/24/2020   ESRD on hemodialysis ([facility]) 05/31/2020   Anemia of chronic renal failure 05/31/2020   Peripheral arterial disease ([facility]) 05/31/2020   Osteomyelitis of fifth toe of right foot ([facility]) 05/30/2020   Diarrhea, unspecified 05/17/2020   Allergy, unspecified, initial encounter 10/04/2019   Other disorders of phosphorus metabolism 12/05/2014   Aftercare including intermittent dialysis ([facility]) 09/14/2014   Fever, unspecified 09/14/2014   Headache, unspecified 09/14/2014   Coagulation defect, unspecified ([place]) 09/14/2014   Pruritus, unspecified 09/14/2014   Pain, unspecified 08/24/2014   Hyperkalemia 12/03/2013   Encounter for immunization 09/25/2013   Hypercalcemia 08/03/2013   Other abnormal glucose 09/25/2012   Macular corneal dystrophy 06/16/2002   Secondary hyperparathyroidism of renal origin ([facility]) 06/16/2002   Iron deficiency anemia, unspecified 09/18/2001    Zachery Conch MOT, OTR/L  08/01/2021, 4:31 PM  [facility] [address] [place], [place], [address] Phone: [phone]   Fax:  [phone]  Name: Timothy Ponce MRN: [id number] Date of Birth: 19-Jun-1956

## 2021-08-02 DIAGNOSIS — D509 Iron deficiency anemia, unspecified: Secondary | ICD-10-CM | POA: Diagnosis not present

## 2021-08-02 DIAGNOSIS — N186 End stage renal disease: Secondary | ICD-10-CM | POA: Diagnosis not present

## 2021-08-02 DIAGNOSIS — Z992 Dependence on renal dialysis: Secondary | ICD-10-CM | POA: Diagnosis not present

## 2021-08-02 DIAGNOSIS — N2581 Secondary hyperparathyroidism of renal origin: Secondary | ICD-10-CM | POA: Diagnosis not present

## 2021-08-03 ENCOUNTER — Other Ambulatory Visit: Payer: Self-pay

## 2021-08-03 ENCOUNTER — Encounter: Payer: Self-pay | Admitting: Occupational Therapy

## 2021-08-03 ENCOUNTER — Ambulatory Visit: Payer: Medicare Other | Admitting: Occupational Therapy

## 2021-08-03 DIAGNOSIS — M25612 Stiffness of left shoulder, not elsewhere classified: Secondary | ICD-10-CM

## 2021-08-03 DIAGNOSIS — M25642 Stiffness of left hand, not elsewhere classified: Secondary | ICD-10-CM | POA: Diagnosis not present

## 2021-08-03 DIAGNOSIS — G629 Polyneuropathy, unspecified: Secondary | ICD-10-CM

## 2021-08-03 DIAGNOSIS — M25641 Stiffness of right hand, not elsewhere classified: Secondary | ICD-10-CM

## 2021-08-03 DIAGNOSIS — M25611 Stiffness of right shoulder, not elsewhere classified: Secondary | ICD-10-CM | POA: Diagnosis not present

## 2021-08-03 DIAGNOSIS — M6281 Muscle weakness (generalized): Secondary | ICD-10-CM | POA: Diagnosis not present

## 2021-08-03 DIAGNOSIS — R278 Other lack of coordination: Secondary | ICD-10-CM

## 2021-08-03 NOTE — Therapy (Signed)
[facility] [address] [place], [place], [address] Phone: [phone]   Fax:  [phone]  Occupational Therapy Treatment  Patient Details  Name: Timothy Ponce MRN: [id number] Date of Birth: 07/17/1956 Referring Provider (OT): Dr. Joelyn Oms   Encounter Date: 08/03/2021   OT End of Session - 08/03/21 1538     Visit Number 13    Number of Visits 24    Date for OT Re-Evaluation 08/29/21    Authorization Type Medicare, BCBS, Medicaid    Authorization - Visit Number 4    Authorization - Number of Visits 10    Progress Note Due on Visit 20    OT Start Time 1536    OT Stop Time 1615    OT Time Calculation (min) 39 min    Activity Tolerance Patient tolerated treatment well    Behavior During Therapy WFL for tasks assessed/performed             Past Medical History:  Diagnosis Date   Cataract    Complication of anesthesia    "Awakens during surgery"   Dyspnea    ESRD (end stage renal disease) ([facility])    TTHSAT, [address]    Glaucoma    Neuropathy    hands - per patient    Past Surgical History:  Procedure Laterality Date   AMPUTATION Right 06/02/2020   Procedure: RIGHT FOOT 5TH RAY AMPUTATION;  Surgeon: Newt Minion, MD;  Location: [facility];  Service: Orthopedics;  Laterality: Right;   AV FISTULA PLACEMENT     INSERTION OF DIALYSIS CATHETER Right 12/08/2020   Procedure: INSERTION OF DIALYSIS CATHETER USING 19cm PALINDROME CHRONIC CATHETER;  Surgeon: Waynetta Sandy, MD;  Location: [facility];  Service: Vascular;  Laterality: Right;   REVISON OF ARTERIOVENOUS FISTULA Right 12/06/2020   Procedure: REVISON OF RIGHT ARM FISTULA;  Surgeon: Rosetta Posner, MD;  Location: [facility];  Service: Vascular;  Laterality: Right;    There were no vitals filed for this visit.   Subjective Assessment - 08/03/21 1538     Subjective  "i'm a little dissheveled with issues of my ride"    Pertinent History Pt is a 65  y.o male with neuropathy of UE.  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 Pt presents to occupational therapy with the following deficits: decreased strength, decreased ROM, decreased coordination, visual deficits which impedes performance of ADLs/ IADLS. Pt can benefit from skilled OT to address these deficits.    Limitations Fistula  RUE exercise use caution,  no pressure over fistula, no BP RUE    Patient Stated Goals get hands working    Currently in Pain? No/denies                          OT Treatments/Exercises (OP) - 08/03/21 1541       Fine Motor Coordination (Hand/Wrist)   Fine Motor Coordination Flipping cards;Manipulation of small objects;Picking up coins    Manipulation of small objects picking up 2 cm blocks with BUE (alternating) and with good coordination. Pt demonstrates ability to pick up smaller objects but is limited by vision with overall ability to use hands    Flipping cards started with jumbo cards and flipping with BUE - pt with greater ease with LUE > RUE. Pt completed flipping cards with normal size  playing cards. Pt demonsrating increased ease with functional coordination.    Picking up coins picking up [place] with RUE and placing into medium cone with min drops                      OT Short Term Goals - 07/20/21 1242       OT SHORT TERM GOAL #1   Title I with HEP    Time 4    Period Weeks    Status Achieved      OT SHORT TERM GOAL #2   Title Pt will increase bilateral grip strength by 5 lbs for increased functional use.    Time 4    Period Weeks    Status On-going   met for left hand only     OT SHORT TERM GOAL #3   Title Pt will verbalzie understanding of AE/adapted strategies to maximize safety and I with ADLS/IADLs( buttons, zippers)    Time 4    Period Weeks    Status On-going   met for  buttons/ zippers,.has info regarding  zipper pull rings, pt was provided with information regarding magnetic buttons for shirt,  pt may benefit from adaptations for work activities     OT [place] #4   Title Pt will increase bilateral shoulder flexion by for RUE to 115, and LUE to 120 for increased ease with ADLs.    Time 4    Period Weeks    Status On-going               OT Long Term Goals - 07/20/21 1242       OT LONG TERM GOAL #1   Title I with  HEP for grip stength    Time 12    Period Weeks    Status On-going      OT LONG TERM GOAL #2   Title Pt will increase bilateral grip strength to at least 20 lbs for improved ease with ADLs.    Time 12    Period Weeks    Status On-going      OT LONG TERM GOAL #3   Title Pt will demonstrate at least 80% composite finger flexion bilaterally for improved fuction during ADLs.    Time 12    Period Weeks    Status On-going      OT LONG TERM GOAL #4   Title Pt will report increased ease with writing and typing for work.    Time 12    Period Weeks    Status On-going      OT LONG TERM GOAL #5   Title Pt will demonstrate ability to oppose all digits with thumb for left hand for increased ease with daily activities.    Time 12    Period Weeks    Status On-going                   Plan - 08/03/21 1539     Clinical Impression Statement Pt is progressing towards goals. Pt demonstrates increased cooridnation and ability to hold and manipulate smaller objects today    OT Occupational Profile and History Problem Focused Assessment - Including review of records relating to presenting problem    Occupational performance deficits (Please refer to evaluation for details): ADL's;IADL's;Work;Leisure;Social Participation    Body Structure / Function / Physical Skills ADL;Balance;Strength;UE functional use;Flexibility;FMC;Vision;GMC;ROM;Decreased knowledge of use of DME;Dexterity;IADL;Sensation    Rehab Potential Good     Clinical  Decision Making Several treatment options, min-mod task modification necessary    Comorbidities Affecting Occupational Performance: May have comorbidities impacting occupational performance   complex medical hx   Modification or Assistance to Complete Evaluation  Min-Moderate modification of tasks or assist with assess necessary to complete eval   visual and sensory deficits   OT Frequency 2x / week   anticipate d/c after 8 weeks, POC written for 12 to acccount for missed visits etc   OT Duration 12 weeks    OT Treatment/Interventions Self-care/ADL training;Therapeutic exercise;Ultrasound;Neuromuscular education;Manual Therapy;Splinting;Therapeutic activities;Cryotherapy;Paraffin;DME and/or AE instruction;Fluidtherapy;Contrast Bath;Moist Heat;Passive range of motion;Patient/family education    Plan pt has significant visual impairment,   be careful regarding fistula RUE at wrist - no pressure over fistula,   Review cane exercises, functional use of hands    Consulted and Agree with Plan of Care Patient             Patient will benefit from skilled therapeutic intervention in order to improve the following deficits and impairments:   Body Structure / Function / Physical Skills: ADL, Balance, Strength, UE functional use, Flexibility, FMC, Vision, GMC, ROM, Decreased knowledge of use of DME, Dexterity, IADL, Sensation       Visit Diagnosis: Stiffness of right hand, not elsewhere classified  Stiffness of right shoulder, not elsewhere classified  Stiffness of left shoulder, not elsewhere classified  Stiffness of left hand, not elsewhere classified  Muscle weakness (generalized)  Other lack of coordination  Neuropathy    Problem List Patient Active Problem List   Diagnosis Date Noted   Complication of vascular dialysis catheter 12/10/2020   Encounter for removal of sutures 06/24/2020   ESRD on hemodialysis ([facility]) 05/31/2020   Anemia of chronic renal failure 05/31/2020    Peripheral arterial disease ([facility]) 05/31/2020   Osteomyelitis of fifth toe of right foot ([facility]) 05/30/2020   Diarrhea, unspecified 05/17/2020   Allergy, unspecified, initial encounter 10/04/2019   Other disorders of phosphorus metabolism 12/05/2014   Aftercare including intermittent dialysis ([place]) 09/14/2014   Fever, unspecified 09/14/2014   Headache, unspecified 09/14/2014   Coagulation defect, unspecified ([facility]) 09/14/2014   Pruritus, unspecified 09/14/2014   Pain, unspecified 08/24/2014   Hyperkalemia 12/03/2013   Encounter for immunization 09/25/2013   Hypercalcemia 08/03/2013   Other abnormal glucose 09/25/2012   Macular corneal dystrophy 06/16/2002   Secondary hyperparathyroidism of renal origin ([facility]) 06/16/2002   Iron deficiency anemia, unspecified 09/18/2001    Zachery Conch MOT, OTR/L  08/03/2021, 4:06 PM  [facility] [address] [place], [place], [address] Phone: [phone]   Fax:  [phone]  Name: Timothy Ponce MRN: [id number] Date of Birth: 10-Nov-1956

## 2021-08-04 DIAGNOSIS — D509 Iron deficiency anemia, unspecified: Secondary | ICD-10-CM | POA: Diagnosis not present

## 2021-08-04 DIAGNOSIS — Z992 Dependence on renal dialysis: Secondary | ICD-10-CM | POA: Diagnosis not present

## 2021-08-04 DIAGNOSIS — N186 End stage renal disease: Secondary | ICD-10-CM | POA: Diagnosis not present

## 2021-08-04 DIAGNOSIS — N2581 Secondary hyperparathyroidism of renal origin: Secondary | ICD-10-CM | POA: Diagnosis not present

## 2021-08-06 ENCOUNTER — Ambulatory Visit: Payer: Medicare Other | Admitting: Occupational Therapy

## 2021-08-06 ENCOUNTER — Other Ambulatory Visit: Payer: Self-pay

## 2021-08-06 ENCOUNTER — Encounter: Payer: Self-pay | Admitting: Occupational Therapy

## 2021-08-06 DIAGNOSIS — R278 Other lack of coordination: Secondary | ICD-10-CM | POA: Diagnosis not present

## 2021-08-06 DIAGNOSIS — M25642 Stiffness of left hand, not elsewhere classified: Secondary | ICD-10-CM

## 2021-08-06 DIAGNOSIS — M25612 Stiffness of left shoulder, not elsewhere classified: Secondary | ICD-10-CM | POA: Diagnosis not present

## 2021-08-06 DIAGNOSIS — M25641 Stiffness of right hand, not elsewhere classified: Secondary | ICD-10-CM | POA: Diagnosis not present

## 2021-08-06 DIAGNOSIS — M25611 Stiffness of right shoulder, not elsewhere classified: Secondary | ICD-10-CM | POA: Diagnosis not present

## 2021-08-06 DIAGNOSIS — M6281 Muscle weakness (generalized): Secondary | ICD-10-CM

## 2021-08-06 NOTE — Therapy (Signed)
[facility] [address] [place], [place], [address] Phone: [phone]   Fax:  [phone]  Occupational Therapy Treatment  Patient Details  Name: Timothy Ponce MRN: [id number] Date of Birth: August 01, 1956 Referring Provider (OT): Dr. Joelyn Oms   Encounter Date: 08/06/2021   OT End of Session - 08/06/21 1525     Visit Number 14    Number of Visits 24    Date for OT Re-Evaluation 08/29/21    Authorization Type Medicare, BCBS, Medicaid    Authorization - Visit Number 14    Progress Note Due on Visit 20    OT Start Time 1526    OT Stop Time 1608    OT Time Calculation (min) 42 min    Activity Tolerance Patient tolerated treatment well    Behavior During Therapy WFL for tasks assessed/performed             Past Medical History:  Diagnosis Date   Cataract    Complication of anesthesia    "Awakens during surgery"   Dyspnea    ESRD (end stage renal disease) ([facility])    TTHSAT, [address]    Glaucoma    Neuropathy    hands - per patient    Past Surgical History:  Procedure Laterality Date   AMPUTATION Right 06/02/2020   Procedure: RIGHT FOOT 5TH RAY AMPUTATION;  Surgeon: Newt Minion, MD;  Location: [facility];  Service: Orthopedics;  Laterality: Right;   AV FISTULA PLACEMENT     INSERTION OF DIALYSIS CATHETER Right 12/08/2020   Procedure: INSERTION OF DIALYSIS CATHETER USING 19cm PALINDROME CHRONIC CATHETER;  Surgeon: Waynetta Sandy, MD;  Location: [facility];  Service: Vascular;  Laterality: Right;   REVISON OF ARTERIOVENOUS FISTULA Right 12/06/2020   Procedure: REVISON OF RIGHT ARM FISTULA;  Surgeon: Rosetta Posner, MD;  Location: [facility];  Service: Vascular;  Laterality: Right;    There were no vitals filed for this visit.   Subjective Assessment - 08/06/21 1527     Subjective  Pt reports "fine" and denies pain.    Pertinent History Pt is a 65 y.o male with neuropathy of UE.  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 Pt presents to occupational therapy with the following deficits: decreased strength, decreased ROM, decreased coordination, visual deficits which impedes performance of ADLs/ IADLS. Pt can benefit from skilled OT to address these deficits.    Limitations Fistula  RUE exercise use caution,  no pressure over fistula, no BP RUE    Patient Stated Goals get hands working    Currently in Pain? No/denies                          OT Treatments/Exercises (OP) - 08/06/21 1535       Functional Reaching Activities   Mid Level BUE reaching with resistance clothespins with 1-8# resistance - pt with difficulty with depth perception but did well with coordination of clothespins and sustained grasp with BUE. Pt would sometimes have to use opposite hand to feel for the antenna for compensation and use of tactile input      Fine Motor Coordination (Hand/Wrist)   Fine Motor Coordination Manipulation of small objects    Manipulation of small objects picking up small dice and key for functional coordination of items with no difficulty. Pt verbalized  increased ease with key with plastic base - encouraged to get key aid with infomation that he received last session                      OT Short Term Goals - 07/20/21 1242       OT SHORT TERM GOAL #1   Title I with HEP    Time 4    Period Weeks    Status Achieved      OT SHORT TERM GOAL #2   Title Pt will increase bilateral grip strength by 5 lbs for increased functional use.    Time 4    Period Weeks    Status On-going   met for left hand only     OT SHORT TERM GOAL #3   Title Pt will verbalzie understanding of AE/adapted strategies to maximize safety and I with ADLS/IADLs( buttons, zippers)    Time 4    Period Weeks    Status On-going   met for buttons/ zippers,.has info regarding   zipper pull rings, pt was provided with information regarding magnetic buttons for shirt,  pt may benefit from adaptations for work activities     OT [place] #4   Title Pt will increase bilateral shoulder flexion by for RUE to 115, and LUE to 120 for increased ease with ADLs.    Time 4    Period Weeks    Status On-going               OT Long Term Goals - 07/20/21 1242       OT LONG TERM GOAL #1   Title I with  HEP for grip stength    Time 12    Period Weeks    Status On-going      OT LONG TERM GOAL #2   Title Pt will increase bilateral grip strength to at least 20 lbs for improved ease with ADLs.    Time 12    Period Weeks    Status On-going      OT LONG TERM GOAL #3   Title Pt will demonstrate at least 80% composite finger flexion bilaterally for improved fuction during ADLs.    Time 12    Period Weeks    Status On-going      OT LONG TERM GOAL #4   Title Pt will report increased ease with writing and typing for work.    Time 12    Period Weeks    Status On-going      OT LONG TERM GOAL #5   Title Pt will demonstrate ability to oppose all digits with thumb for left hand for increased ease with daily activities.    Time 12    Period Weeks    Status On-going                   Plan - 08/06/21 1611     Clinical Impression Statement Pt reports adapted key did not work well for him - encouraged to look into getting key aid with information received.    OT Occupational Profile and History Problem Focused Assessment - Including review of records relating to presenting problem    Occupational performance deficits (Please refer to evaluation for details): ADL's;IADL's;Work;Leisure;Social Participation    Body Structure / Function / Physical Skills ADL;Balance;Strength;UE functional use;Flexibility;FMC;Vision;GMC;ROM;Decreased knowledge of use of DME;Dexterity;IADL;Sensation    Rehab Potential Good    Clinical Decision Making Several treatment options,  min-mod task  modification necessary    Comorbidities Affecting Occupational Performance: May have comorbidities impacting occupational performance   complex medical hx   Modification or Assistance to Complete Evaluation  Min-Moderate modification of tasks or assist with assess necessary to complete eval   visual and sensory deficits   OT Frequency 2x / week   anticipate d/c after 8 weeks, POC written for 12 to acccount for missed visits etc   OT Duration 12 weeks    OT Treatment/Interventions Self-care/ADL training;Therapeutic exercise;Ultrasound;Neuromuscular education;Manual Therapy;Splinting;Therapeutic activities;Cryotherapy;Paraffin;DME and/or AE instruction;Fluidtherapy;Contrast Bath;Moist Heat;Passive range of motion;Patient/family education    Plan pt has significant visual impairment,   be careful regarding fistula RUE at wrist - no pressure over fistula,   Review cane exercises, functional use of hands    Consulted and Agree with Plan of Care Patient             Patient will benefit from skilled therapeutic intervention in order to improve the following deficits and impairments:   Body Structure / Function / Physical Skills: ADL, Balance, Strength, UE functional use, Flexibility, FMC, Vision, GMC, ROM, Decreased knowledge of use of DME, Dexterity, IADL, Sensation       Visit Diagnosis: Stiffness of right hand, not elsewhere classified  Stiffness of right shoulder, not elsewhere classified  Stiffness of left shoulder, not elsewhere classified  Stiffness of left hand, not elsewhere classified  Muscle weakness (generalized)  Other lack of coordination    Problem List Patient Active Problem List   Diagnosis Date Noted   Complication of vascular dialysis catheter 12/10/2020   Encounter for removal of sutures 06/24/2020   ESRD on hemodialysis ([facility]) 05/31/2020   Anemia of chronic renal failure 05/31/2020   Peripheral arterial disease ([facility]) 05/31/2020   Osteomyelitis of  fifth toe of right foot ([facility]) 05/30/2020   Diarrhea, unspecified 05/17/2020   Allergy, unspecified, initial encounter 10/04/2019   Other disorders of phosphorus metabolism 12/05/2014   Aftercare including intermittent dialysis ([facility]) 09/14/2014   Fever, unspecified 09/14/2014   Headache, unspecified 09/14/2014   Coagulation defect, unspecified ([place]) 09/14/2014   Pruritus, unspecified 09/14/2014   Pain, unspecified 08/24/2014   Hyperkalemia 12/03/2013   Encounter for immunization 09/25/2013   Hypercalcemia 08/03/2013   Other abnormal glucose 09/25/2012   Macular corneal dystrophy 06/16/2002   Secondary hyperparathyroidism of renal origin ([facility]) 06/16/2002   Iron deficiency anemia, unspecified 09/18/2001    Zachery Conch MOT, OTR/L  08/06/2021, 4:12 PM  [facility] [address] [place], [place], [address] Phone: [phone]   Fax:  [phone]  Name: Timothy Ponce MRN: [id number] Date of Birth: 1956/10/01

## 2021-08-07 DIAGNOSIS — N186 End stage renal disease: Secondary | ICD-10-CM | POA: Diagnosis not present

## 2021-08-07 DIAGNOSIS — D509 Iron deficiency anemia, unspecified: Secondary | ICD-10-CM | POA: Diagnosis not present

## 2021-08-07 DIAGNOSIS — Z992 Dependence on renal dialysis: Secondary | ICD-10-CM | POA: Diagnosis not present

## 2021-08-07 DIAGNOSIS — N2581 Secondary hyperparathyroidism of renal origin: Secondary | ICD-10-CM | POA: Diagnosis not present

## 2021-08-08 ENCOUNTER — Other Ambulatory Visit: Payer: Self-pay

## 2021-08-08 ENCOUNTER — Ambulatory Visit: Payer: Medicare Other | Admitting: Occupational Therapy

## 2021-08-08 ENCOUNTER — Encounter: Payer: Self-pay | Admitting: Occupational Therapy

## 2021-08-08 DIAGNOSIS — M25641 Stiffness of right hand, not elsewhere classified: Secondary | ICD-10-CM | POA: Diagnosis not present

## 2021-08-08 DIAGNOSIS — M25611 Stiffness of right shoulder, not elsewhere classified: Secondary | ICD-10-CM | POA: Diagnosis not present

## 2021-08-08 DIAGNOSIS — M6281 Muscle weakness (generalized): Secondary | ICD-10-CM | POA: Diagnosis not present

## 2021-08-08 DIAGNOSIS — M25612 Stiffness of left shoulder, not elsewhere classified: Secondary | ICD-10-CM

## 2021-08-08 DIAGNOSIS — M25642 Stiffness of left hand, not elsewhere classified: Secondary | ICD-10-CM

## 2021-08-08 DIAGNOSIS — R278 Other lack of coordination: Secondary | ICD-10-CM | POA: Diagnosis not present

## 2021-08-08 NOTE — Therapy (Signed)
[facility] [address] [place], [place], [address] Phone: [phone]   Fax:  [phone]  Occupational Therapy Treatment  Patient Details  Name: Timothy Ponce MRN: [id number] Date of Birth: 1956/03/30 Referring Provider (OT): Dr. Joelyn Oms   Encounter Date: 08/08/2021   OT End of Session - 08/08/21 1532     Visit Number 15    Number of Visits 24    Date for OT Re-Evaluation 08/29/21    Authorization Type Medicare, BCBS, Medicaid    Authorization - Visit Number 15    Progress Note Due on Visit 57    OT Start Time 1530    OT Stop Time 1615    OT Time Calculation (min) 45 min    Activity Tolerance Patient tolerated treatment well    Behavior During Therapy WFL for tasks assessed/performed             Past Medical History:  Diagnosis Date   Cataract    Complication of anesthesia    "Awakens during surgery"   Dyspnea    ESRD (end stage renal disease) ([facility])    TTHSAT, [address]    Glaucoma    Neuropathy    hands - per patient    Past Surgical History:  Procedure Laterality Date   AMPUTATION Right 06/02/2020   Procedure: RIGHT FOOT 5TH RAY AMPUTATION;  Surgeon: Newt Minion, MD;  Location: [facility];  Service: Orthopedics;  Laterality: Right;   AV FISTULA PLACEMENT     INSERTION OF DIALYSIS CATHETER Right 12/08/2020   Procedure: INSERTION OF DIALYSIS CATHETER USING 19cm PALINDROME CHRONIC CATHETER;  Surgeon: Waynetta Sandy, MD;  Location: [facility];  Service: Vascular;  Laterality: Right;   REVISON OF ARTERIOVENOUS FISTULA Right 12/06/2020   Procedure: REVISON OF RIGHT ARM FISTULA;  Surgeon: Rosetta Posner, MD;  Location: [facility];  Service: Vascular;  Laterality: Right;    There were no vitals filed for this visit.   Subjective Assessment - 08/08/21 1532     Subjective  "it's busy - a lot to do" Pt reports RUE is more stiff today.    Pertinent History Pt is a 65 y.o male with neuropathy of  UE.  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 Pt presents to occupational therapy with the following deficits: decreased strength, decreased ROM, decreased coordination, visual deficits which impedes performance of ADLs/ IADLS. Pt can benefit from skilled OT to address these deficits.    Limitations Fistula  RUE exercise use caution,  no pressure over fistula, no BP RUE    Patient Stated Goals get hands working    Currently in Pain? No/denies                          OT Treatments/Exercises (OP) - 08/08/21 0001       Hand Exercises   Other Hand Exercises grasp and release of 1 inch blocks with RUE with good coordination ad no drops. Pt could not use hand gripper d/t weakness and visual deficits      Modalities   Modalities Moist Heat   worked on LUE coordination during moist heat on RUE     Moist Heat Therapy   Number Minutes Moist Heat 10 Minutes   no adverse reactions   Moist Heat Location Hand   RUE  Fine Motor Coordination (Hand/Wrist)   Fine Motor Coordination Large Pegboard    Large Pegboard semicircle peg board - pt placed white and red and grey pegs in holes with LUE for increase in grasp and release and coordination. Pt with max difficulty with grey pegs today. Removed with RUE with little difficulty                      OT Short Term Goals - 07/20/21 1242       OT SHORT TERM GOAL #1   Title I with HEP    Time 4    Period Weeks    Status Achieved      OT SHORT TERM GOAL #2   Title Pt will increase bilateral grip strength by 5 lbs for increased functional use.    Time 4    Period Weeks    Status On-going   met for left hand only     OT SHORT TERM GOAL #3   Title Pt will verbalzie understanding of AE/adapted strategies to maximize safety and I with ADLS/IADLs( buttons, zippers)    Time 4    Period Weeks    Status  On-going   met for buttons/ zippers,.has info regarding  zipper pull rings, pt was provided with information regarding magnetic buttons for shirt,  pt may benefit from adaptations for work activities     OT [place] #4   Title Pt will increase bilateral shoulder flexion by for RUE to 115, and LUE to 120 for increased ease with ADLs.    Time 4    Period Weeks    Status On-going               OT Long Term Goals - 07/20/21 1242       OT LONG TERM GOAL #1   Title I with  HEP for grip stength    Time 12    Period Weeks    Status On-going      OT LONG TERM GOAL #2   Title Pt will increase bilateral grip strength to at least 20 lbs for improved ease with ADLs.    Time 12    Period Weeks    Status On-going      OT LONG TERM GOAL #3   Title Pt will demonstrate at least 80% composite finger flexion bilaterally for improved fuction during ADLs.    Time 12    Period Weeks    Status On-going      OT LONG TERM GOAL #4   Title Pt will report increased ease with writing and typing for work.    Time 12    Period Weeks    Status On-going      OT LONG TERM GOAL #5   Title Pt will demonstrate ability to oppose all digits with thumb for left hand for increased ease with daily activities.    Time 12    Period Weeks    Status On-going                   Plan - 08/08/21 1545     Clinical Impression Statement Pt with good response to moist heat for decreasing stiffness in RUE    OT Occupational Profile and History Problem Focused Assessment - Including review of records relating to presenting problem    Occupational performance deficits (Please refer to evaluation for details): ADL's;IADL's;Work;Leisure;Social Participation    Body Structure / Function / Physical Skills ADL;Balance;Strength;UE  functional use;Flexibility;FMC;Vision;GMC;ROM;Decreased knowledge of use of DME;Dexterity;IADL;Sensation    Rehab Potential Good    Clinical Decision Making Several treatment  options, min-mod task modification necessary    Comorbidities Affecting Occupational Performance: May have comorbidities impacting occupational performance   complex medical hx   Modification or Assistance to Complete Evaluation  Min-Moderate modification of tasks or assist with assess necessary to complete eval   visual and sensory deficits   OT Frequency 2x / week   anticipate d/c after 8 weeks, POC written for 12 to acccount for missed visits etc   OT Duration 12 weeks    OT Treatment/Interventions Self-care/ADL training;Therapeutic exercise;Ultrasound;Neuromuscular education;Manual Therapy;Splinting;Therapeutic activities;Cryotherapy;Paraffin;DME and/or AE instruction;Fluidtherapy;Contrast Bath;Moist Heat;Passive range of motion;Patient/family education    Plan pt has significant visual impairment,   be careful regarding fistula RUE at wrist - no pressure over fistula,   Review cane exercises, functional use of hands    Consulted and Agree with Plan of Care Patient             Patient will benefit from skilled therapeutic intervention in order to improve the following deficits and impairments:   Body Structure / Function / Physical Skills: ADL, Balance, Strength, UE functional use, Flexibility, FMC, Vision, GMC, ROM, Decreased knowledge of use of DME, Dexterity, IADL, Sensation       Visit Diagnosis: Stiffness of right hand, not elsewhere classified  Stiffness of right shoulder, not elsewhere classified  Stiffness of left shoulder, not elsewhere classified  Stiffness of left hand, not elsewhere classified  Muscle weakness (generalized)  Other lack of coordination    Problem List Patient Active Problem List   Diagnosis Date Noted   Complication of vascular dialysis catheter 12/10/2020   Encounter for removal of sutures 06/24/2020   ESRD on hemodialysis ([facility]) 05/31/2020   Anemia of chronic renal failure 05/31/2020   Peripheral arterial disease ([facility]) 05/31/2020    Osteomyelitis of fifth toe of right foot ([facility]) 05/30/2020   Diarrhea, unspecified 05/17/2020   Allergy, unspecified, initial encounter 10/04/2019   Other disorders of phosphorus metabolism 12/05/2014   Aftercare including intermittent dialysis ([facility]) 09/14/2014   Fever, unspecified 09/14/2014   Headache, unspecified 09/14/2014   Coagulation defect, unspecified ([facility]) 09/14/2014   Pruritus, unspecified 09/14/2014   Pain, unspecified 08/24/2014   Hyperkalemia 12/03/2013   Encounter for immunization 09/25/2013   Hypercalcemia 08/03/2013   Other abnormal glucose 09/25/2012   Macular corneal dystrophy 06/16/2002   Secondary hyperparathyroidism of renal origin ([facility]) 06/16/2002   Iron deficiency anemia, unspecified 09/18/2001    Zachery Conch MOT, OTR/L  08/08/2021, 4:12 PM  [facility] [address] [place], [place], [address] Phone: [phone]   Fax:  [phone]  Name: Timothy Ponce MRN: [id number] Date of Birth: 23-Dec-1955

## 2021-08-11 DIAGNOSIS — N186 End stage renal disease: Secondary | ICD-10-CM | POA: Diagnosis not present

## 2021-08-11 DIAGNOSIS — Z992 Dependence on renal dialysis: Secondary | ICD-10-CM | POA: Diagnosis not present

## 2021-08-11 DIAGNOSIS — N2581 Secondary hyperparathyroidism of renal origin: Secondary | ICD-10-CM | POA: Diagnosis not present

## 2021-08-11 DIAGNOSIS — D509 Iron deficiency anemia, unspecified: Secondary | ICD-10-CM | POA: Diagnosis not present

## 2021-08-13 ENCOUNTER — Ambulatory Visit: Payer: Medicare Other | Admitting: Occupational Therapy

## 2021-08-13 ENCOUNTER — Other Ambulatory Visit: Payer: Self-pay

## 2021-08-13 ENCOUNTER — Encounter: Payer: Self-pay | Admitting: Occupational Therapy

## 2021-08-13 DIAGNOSIS — M25641 Stiffness of right hand, not elsewhere classified: Secondary | ICD-10-CM | POA: Diagnosis not present

## 2021-08-13 DIAGNOSIS — M25642 Stiffness of left hand, not elsewhere classified: Secondary | ICD-10-CM | POA: Diagnosis not present

## 2021-08-13 DIAGNOSIS — M6281 Muscle weakness (generalized): Secondary | ICD-10-CM

## 2021-08-13 DIAGNOSIS — M25612 Stiffness of left shoulder, not elsewhere classified: Secondary | ICD-10-CM

## 2021-08-13 DIAGNOSIS — R278 Other lack of coordination: Secondary | ICD-10-CM | POA: Diagnosis not present

## 2021-08-13 DIAGNOSIS — M25611 Stiffness of right shoulder, not elsewhere classified: Secondary | ICD-10-CM | POA: Diagnosis not present

## 2021-08-13 NOTE — Therapy (Signed)
[facility] [address] [place], [place], [address] Phone: [phone]   Fax:  [phone]  Occupational Therapy Treatment  Patient Details  Name: Timothy Ponce MRN: [id number] Date of Birth: 1956-05-09 Referring Provider (OT): Dr. Joelyn Oms   Encounter Date: 08/13/2021   OT End of Session - 08/13/21 1539     Visit Number 16    Number of Visits 24    Date for OT Re-Evaluation 08/29/21    Authorization Type Medicare, BCBS, Medicaid    Authorization - Visit Number 16    Progress Note Due on Visit 14    OT Start Time 1533    OT Stop Time 1615    OT Time Calculation (min) 42 min    Activity Tolerance Patient tolerated treatment well    Behavior During Therapy WFL for tasks assessed/performed             Past Medical History:  Diagnosis Date   Cataract    Complication of anesthesia    "Awakens during surgery"   Dyspnea    ESRD (end stage renal disease) ([facility])    TTHSAT, [address]    Glaucoma    Neuropathy    hands - per patient    Past Surgical History:  Procedure Laterality Date   AMPUTATION Right 06/02/2020   Procedure: RIGHT FOOT 5TH RAY AMPUTATION;  Surgeon: Newt Minion, MD;  Location: [facility];  Service: Orthopedics;  Laterality: Right;   AV FISTULA PLACEMENT     INSERTION OF DIALYSIS CATHETER Right 12/08/2020   Procedure: INSERTION OF DIALYSIS CATHETER USING 19cm PALINDROME CHRONIC CATHETER;  Surgeon: Waynetta Sandy, MD;  Location: [facility];  Service: Vascular;  Laterality: Right;   REVISON OF ARTERIOVENOUS FISTULA Right 12/06/2020   Procedure: REVISON OF RIGHT ARM FISTULA;  Surgeon: Rosetta Posner, MD;  Location: [facility];  Service: Vascular;  Laterality: Right;    There were no vitals filed for this visit.   Subjective Assessment - 08/13/21 1539     Subjective  Pt denies any pain. "Feeling soreness in my forearm from yesterday"    Pertinent History Pt is a 65 y.o male with  neuropathy of UE.  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 Pt presents to occupational therapy with the following deficits: decreased strength, decreased ROM, decreased coordination, visual deficits which impedes performance of ADLs/ IADLS. Pt can benefit from skilled OT to address these deficits.    Limitations Fistula  RUE exercise use caution,  no pressure over fistula, no BP RUE    Patient Stated Goals get hands working    Currently in Pain? No/denies                          OT Treatments/Exercises (OP) - 08/13/21 0001       Hand Exercises   Other Hand Exercises hand gripper level 1 with black spring with BUE (individually) with picking up 1 inch locks x 7      Functional Reaching Activities   Low Level grasp and release and coordination of using LUE with resistance clothespins 1-8#  - pt demonstrates much improvement with coordination and use of LUE. Returned to [employer] with RUE with increased difficulty      Moist Heat Therapy   Number Minutes Moist Heat 10 Minutes   no adverse reactions -  functional use of LUE during heat   Moist Heat Location Hand   RUE                     OT Short Term Goals - 07/20/21 1242       OT SHORT TERM GOAL #1   Title I with HEP    Time 4    Period Weeks    Status Achieved      OT SHORT TERM GOAL #2   Title Pt will increase bilateral grip strength by 5 lbs for increased functional use.    Time 4    Period Weeks    Status On-going   met for left hand only     OT SHORT TERM GOAL #3   Title Pt will verbalzie understanding of AE/adapted strategies to maximize safety and I with ADLS/IADLs( buttons, zippers)    Time 4    Period Weeks    Status On-going   met for buttons/ zippers,.has info regarding  zipper pull rings, pt was provided with information regarding magnetic buttons for shirt,  pt may  benefit from adaptations for work activities     OT [place] #4   Title Pt will increase bilateral shoulder flexion by for RUE to 115, and LUE to 120 for increased ease with ADLs.    Time 4    Period Weeks    Status On-going               OT Long Term Goals - 07/20/21 1242       OT LONG TERM GOAL #1   Title I with  HEP for grip stength    Time 12    Period Weeks    Status On-going      OT LONG TERM GOAL #2   Title Pt will increase bilateral grip strength to at least 20 lbs for improved ease with ADLs.    Time 12    Period Weeks    Status On-going      OT LONG TERM GOAL #3   Title Pt will demonstrate at least 80% composite finger flexion bilaterally for improved fuction during ADLs.    Time 12    Period Weeks    Status On-going      OT LONG TERM GOAL #4   Title Pt will report increased ease with writing and typing for work.    Time 12    Period Weeks    Status On-going      OT LONG TERM GOAL #5   Title Pt will demonstrate ability to oppose all digits with thumb for left hand for increased ease with daily activities.    Time 12    Period Weeks    Status On-going                   Plan - 08/13/21 1547     Clinical Impression Statement Pt with good response to moist heat for decreasing stiffness in RUE    OT Occupational Profile and History Problem Focused Assessment - Including review of records relating to presenting problem    Occupational performance deficits (Please refer to evaluation for details): ADL's;IADL's;Work;Leisure;Social Participation    Body Structure / Function / Physical Skills ADL;Balance;Strength;UE functional use;Flexibility;FMC;Vision;GMC;ROM;Decreased knowledge of use of DME;Dexterity;IADL;Sensation    Rehab Potential Good    Clinical Decision Making Several treatment options, min-mod task modification necessary    Comorbidities Affecting Occupational Performance: May have comorbidities impacting occupational performance  complex medical hx   Modification or Assistance to Complete Evaluation  Min-Moderate modification of tasks or assist with assess necessary to complete eval   visual and sensory deficits   OT Frequency 2x / week   anticipate d/c after 8 weeks, POC written for 12 to acccount for missed visits etc   OT Duration 12 weeks    OT Treatment/Interventions Self-care/ADL training;Therapeutic exercise;Ultrasound;Neuromuscular education;Manual Therapy;Splinting;Therapeutic activities;Cryotherapy;Paraffin;DME and/or AE instruction;Fluidtherapy;Contrast Bath;Moist Heat;Passive range of motion;Patient/family education    Plan anticipate d/c at end of scheduled visits (next session)    Consulted and Agree with Plan of Care Patient             Patient will benefit from skilled therapeutic intervention in order to improve the following deficits and impairments:   Body Structure / Function / Physical Skills: ADL, Balance, Strength, UE functional use, Flexibility, FMC, Vision, GMC, ROM, Decreased knowledge of use of DME, Dexterity, IADL, Sensation       Visit Diagnosis: Stiffness of right hand, not elsewhere classified  Stiffness of left shoulder, not elsewhere classified  Stiffness of right shoulder, not elsewhere classified  Stiffness of left hand, not elsewhere classified  Muscle weakness (generalized)  Other lack of coordination    Problem List Patient Active Problem List   Diagnosis Date Noted   Complication of vascular dialysis catheter 12/10/2020   Encounter for removal of sutures 06/24/2020   ESRD on hemodialysis ([facility]) 05/31/2020   Anemia of chronic renal failure 05/31/2020   Peripheral arterial disease ([facility]) 05/31/2020   Osteomyelitis of fifth toe of right foot ([facility]) 05/30/2020   Diarrhea, unspecified 05/17/2020   Allergy, unspecified, initial encounter 10/04/2019   Other disorders of phosphorus metabolism 12/05/2014   Aftercare including intermittent dialysis ([facility]) 09/14/2014    Fever, unspecified 09/14/2014   Headache, unspecified 09/14/2014   Coagulation defect, unspecified ([facility]) 09/14/2014   Pruritus, unspecified 09/14/2014   Pain, unspecified 08/24/2014   Hyperkalemia 12/03/2013   Encounter for immunization 09/25/2013   Hypercalcemia 08/03/2013   Other abnormal glucose 09/25/2012   Macular corneal dystrophy 06/16/2002   Secondary hyperparathyroidism of renal origin ([facility]) 06/16/2002   Iron deficiency anemia, unspecified 09/18/2001    Zachery Conch MOT, OTR/L  08/13/2021, 4:24 PM  [facility] [address] [place], [place], [address] Phone: [phone]   Fax:  [phone]  Name: TRAYSON STITELY MRN: [id number] Date of Birth: Feb 09, 1956

## 2021-08-14 DIAGNOSIS — D509 Iron deficiency anemia, unspecified: Secondary | ICD-10-CM | POA: Diagnosis not present

## 2021-08-14 DIAGNOSIS — N2581 Secondary hyperparathyroidism of renal origin: Secondary | ICD-10-CM | POA: Diagnosis not present

## 2021-08-14 DIAGNOSIS — N186 End stage renal disease: Secondary | ICD-10-CM | POA: Diagnosis not present

## 2021-08-14 DIAGNOSIS — Z992 Dependence on renal dialysis: Secondary | ICD-10-CM | POA: Diagnosis not present

## 2021-08-15 ENCOUNTER — Encounter: Payer: Self-pay | Admitting: Occupational Therapy

## 2021-08-15 ENCOUNTER — Ambulatory Visit: Payer: Medicare Other | Admitting: Occupational Therapy

## 2021-08-15 ENCOUNTER — Other Ambulatory Visit: Payer: Self-pay

## 2021-08-15 DIAGNOSIS — M25611 Stiffness of right shoulder, not elsewhere classified: Secondary | ICD-10-CM

## 2021-08-15 DIAGNOSIS — M25642 Stiffness of left hand, not elsewhere classified: Secondary | ICD-10-CM

## 2021-08-15 DIAGNOSIS — R278 Other lack of coordination: Secondary | ICD-10-CM

## 2021-08-15 DIAGNOSIS — M6281 Muscle weakness (generalized): Secondary | ICD-10-CM

## 2021-08-15 DIAGNOSIS — M25641 Stiffness of right hand, not elsewhere classified: Secondary | ICD-10-CM

## 2021-08-15 DIAGNOSIS — M25612 Stiffness of left shoulder, not elsewhere classified: Secondary | ICD-10-CM

## 2021-08-15 NOTE — Therapy (Signed)
[facility] [address] [place], [place], [address] Phone: [phone]   Fax:  [phone]  Occupational Therapy Treatment  Patient Details  Name: Timothy Ponce MRN: [id number] Date of Birth: [birth year] Referring Provider (OT): Dr. Joelyn Oms   Encounter Date: 08/15/2021   OT End of Session - 08/15/21 1534     Visit Number 17    Number of Visits 24    Date for OT Re-Evaluation 08/29/21    Authorization Type Medicare, BCBS, Medicaid    Authorization - Visit Number 17    Progress Note Due on Visit 70    OT Start Time 1534    OT Stop Time 1615    OT Time Calculation (min) 41 min    Activity Tolerance Patient tolerated treatment well    Behavior During Therapy WFL for tasks assessed/performed             Past Medical History:  Diagnosis Date   Cataract    Complication of anesthesia    "Awakens during surgery"   Dyspnea    ESRD (end stage renal disease) ([place])    TTHSAT, [address]    Glaucoma    Neuropathy    hands - per patient    Past Surgical History:  Procedure Laterality Date   AMPUTATION Right 06/02/2020   Procedure: 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   Subjective Assessment - 08/15/21 1534     Subjective  "Today and Monday have been some of the most busy days"    Pertinent History Pt is a 65 y.o male with neuropathy of UE.   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 Pt presents to occupational therapy with the following deficits: decreased strength, decreased ROM, decreased coordination, visual deficits which impedes performance of ADLs/ IADLS. Pt can benefit from skilled OT to address these deficits.    Limitations Fistula  RUE exercise use caution,  no pressure over fistula, no BP RUE    Patient Stated Goals get hands working    Currently in Pain? No/denies                [facility] OT Assessment - 08/15/21 0001       Hand Function   Right Hand Grip (lbs) 14.7 lbs    Left Hand Grip (lbs) 18.7 lbs               OCCUPATIONAL THERAPY DISCHARGE SUMMARY  Visits from Start of Care: 17  Current functional level related to goals / functional outcomes: Pt demonstrated improved grip strength and overall coordination with BUE    Remaining deficits: Continues to have neuropathy and deficits with range of motion in BUE   Education / Equipment: HEPs and information re: adapted equipment   Patient agrees to discharge. Patient goals  were partially met. Patient is being discharged due to being pleased with the current functional level..          OT Treatments/Exercises (OP) - 08/15/21 0001       Hand Exercises   Other Hand Exercises hand gripper level 1 with black spring with BUE (individually) with picking up 1 inch blocks x 13 blocks with RUE and approx 30 with LUE                      OT Short Term Goals - 08/15/21 1538       OT SHORT TERM GOAL #1   Title I with HEP    Time 4    Period Weeks    Status Achieved      OT SHORT TERM GOAL #2   Title Pt will increase bilateral grip strength by 5 lbs for increased functional use.    Time 4    Period Weeks    Status Partially Met   met for left hand only. R 14.7, L 18.7     OT SHORT TERM GOAL #3   Title Pt  will verbalzie understanding of AE/adapted strategies to maximize safety and I with ADLS/IADLs( buttons, zippers)    Time 4    Period Weeks    Status Achieved   met for buttons/ zippers,.has info regarding  zipper pull rings, pt was provided with information regarding magnetic buttons for shirt,  pt provided information for key adaptive tool     OT SHORT TERM GOAL #4   Title Pt will increase bilateral shoulder flexion by for RUE to 115, and LUE to 120 for increased ease with ADLs.    Time 4    Period Weeks    Status Achieved   R 115, L 130              OT Long Term Goals - 08/15/21 1541       OT LONG TERM GOAL #1   Title I with  HEP for grip stength    Time 12    Period Weeks    Status Achieved      OT LONG TERM GOAL #2   Title Pt will increase bilateral grip strength to at least 20 lbs for improved ease with ADLs.    Time 12    Period Weeks    Status Not Met   L 18.7, R 14.3     OT LONG TERM GOAL #3   Title Pt will demonstrate at least 80% composite finger flexion bilaterally for improved fuction during ADLs.    Time 12    Period Weeks    Status Not Met   about 70% on BUE     OT LONG TERM GOAL #4   Title Pt will report increased ease with writing and typing for work.    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    Time 12    Period Weeks    Status Not Met   pt only able to oppose to digits 2 and 3 on LUE  Plan - 08/15/21 1601     Clinical Impression Statement Pt has met majority of goals and made good progress. Ready for discharge from OT today.    OT Occupational Profile and History Problem Focused Assessment - Including review of records relating to presenting problem     Occupational performance deficits (Please refer to evaluation for details): ADL's;IADL's;Work;Leisure;Social Participation    Body Structure / Function / Physical Skills ADL;Balance;Strength;UE functional use;Flexibility;FMC;Vision;GMC;ROM;Decreased knowledge of use of DME;Dexterity;IADL;Sensation    Rehab Potential Good    Clinical Decision Making Several treatment options, min-mod task modification necessary    Comorbidities Affecting Occupational Performance: May have comorbidities impacting occupational performance   complex medical hx   Modification or Assistance to Complete Evaluation  Min-Moderate modification of tasks or assist with assess necessary to complete eval   visual and sensory deficits   OT Frequency 2x / week   anticipate d/c after 8 weeks, POC written for 12 to acccount for missed visits etc   OT Duration 12 weeks    OT Treatment/Interventions Self-care/ADL training;Therapeutic exercise;Ultrasound;Neuromuscular education;Manual Therapy;Splinting;Therapeutic activities;Cryotherapy;Paraffin;DME and/or AE instruction;Fluidtherapy;Contrast Bath;Moist Heat;Passive range of motion;Patient/family education    Plan OT discharge    Consulted and Agree with Plan of Care Patient             Patient will benefit from skilled therapeutic intervention in order to improve the following deficits and impairments:   Body Structure / Function / Physical Skills: ADL, Balance, Strength, UE functional use, Flexibility, FMC, Vision, GMC, ROM, Decreased knowledge of use of DME, Dexterity, IADL, Sensation       Visit Diagnosis: Stiffness of right hand, not elsewhere classified  Stiffness of left shoulder, not elsewhere classified  Stiffness of right shoulder, not elsewhere classified  Muscle weakness (generalized)  Other lack of coordination  Stiffness of left hand, not elsewhere classified    Problem List Patient Active Problem List   Diagnosis Date Noted   Complication of  vascular dialysis catheter 12/10/2020   Encounter for removal of sutures 06/24/2020   ESRD on hemodialysis ([place]) 05/31/2020   Anemia of chronic renal failure 05/31/2020   Peripheral arterial disease ([place]) 05/31/2020   Osteomyelitis of fifth toe of right foot ([place]) 05/30/2020   Diarrhea, unspecified 05/17/2020   Allergy, unspecified, initial encounter 10/04/2019   Other disorders of phosphorus metabolism 12/05/2014   Aftercare including intermittent dialysis ([place]) 09/14/2014   Fever, unspecified 09/14/2014   Headache, unspecified 09/14/2014   Coagulation defect, unspecified ([place]) 09/14/2014   Pruritus, unspecified 09/14/2014   Pain, unspecified 08/24/2014   Hyperkalemia 12/03/2013   Encounter for immunization 09/25/2013   Hypercalcemia 08/03/2013   Other abnormal glucose 09/25/2012   Macular corneal dystrophy 06/16/2002   Secondary hyperparathyroidism of renal origin ([place]) 06/16/2002   Iron deficiency anemia, unspecified 09/18/2001    Zachery Conch MOT, OTR/L  08/15/2021, 4:02 PM  [facility] [address] [place], [place], [address] Phone: [phone]   Fax:  [phone]  Name: TREG DIEMER MRN: [id number] Date of Birth: 1956/04/09

## 2021-08-16 DIAGNOSIS — D509 Iron deficiency anemia, unspecified: Secondary | ICD-10-CM | POA: Diagnosis not present

## 2021-08-16 DIAGNOSIS — N186 End stage renal disease: Secondary | ICD-10-CM | POA: Diagnosis not present

## 2021-08-16 DIAGNOSIS — N2581 Secondary hyperparathyroidism of renal origin: Secondary | ICD-10-CM | POA: Diagnosis not present

## 2021-08-16 DIAGNOSIS — I120 Hypertensive chronic kidney disease with stage 5 chronic kidney disease or end stage renal disease: Secondary | ICD-10-CM | POA: Diagnosis not present

## 2021-08-16 DIAGNOSIS — Z992 Dependence on renal dialysis: Secondary | ICD-10-CM | POA: Diagnosis not present

## 2021-08-17 ENCOUNTER — Other Ambulatory Visit: Payer: Self-pay

## 2021-08-17 ENCOUNTER — Ambulatory Visit (INDEPENDENT_AMBULATORY_CARE_PROVIDER_SITE_OTHER): Payer: Medicare Other | Admitting: Physician Assistant

## 2021-08-17 VITALS — BP 117/66 | HR 60 | Temp 98.0°F | Resp 20 | Ht 68.0 in | Wt 134.0 lb

## 2021-08-17 DIAGNOSIS — Z992 Dependence on renal dialysis: Secondary | ICD-10-CM | POA: Diagnosis not present

## 2021-08-17 DIAGNOSIS — N186 End stage renal disease: Secondary | ICD-10-CM

## 2021-08-17 NOTE — Progress Notes (Signed)
Established Dialysis Access   History of Present Illness   Timothy Ponce is a 65 y.o. (10/22/1956) male who presents for follow up of wound on right radiocephalic AV fistula.  This fistula has been present for over 16 years. He has had to have a revision in December of 2021 for aneurysmal degeneration. At the time of his last visit in February of 2022 this had healed nicely and was cleared for cannulation.   He says that about 1 month ago he had developed a blister on the side of his fistula in the mid forearm. He thinks this was because of tape. He says that at this time it is essentially resolved. He is visually impaired so he is not able to really tell. He reports no pain. He says the fistula has been working well. He denies any bleeding. He does not have any steal symptoms. He has an area that is aneurysmal but he explains this has been present and slowly getting larger for many years. The thin and lightened skin he says has been present. No ulcer or scabbing. He has the techs at dialysis alternate stick sites at every session.  Current Outpatient Medications  Medication Sig Dispense Refill   acetaminophen (TYLENOL) 650 MG CR tablet Take 650 mg by mouth daily as needed (foot pain).     bimatoprost (LUMIGAN) 0.01 % SOLN Place 1 drop into both eyes at bedtime.     brimonidine (ALPHAGAN P) 0.1 % SOLN Place 1 drop into both eyes 2 (two) times daily.     cinacalcet (SENSIPAR) 30 MG tablet Take 30 mg by mouth daily with supper.     dorzolamide-timolol (COSOPT) 22.3-6.8 MG/ML ophthalmic solution Place 1 drop into both eyes 2 (two) times daily.      heparin 1000 unit/mL SOLN injection Heparin Sodium (Porcine) 1,000 Units/mL Systemic     heparin 1000 unit/mL SOLN injection Heparin Sodium (Porcine) 1,000 Units/mL Catheter Lock Arterial     ibuprofen (ADVIL) 200 MG tablet Take 200-400 mg by mouth every 6 (six) hours as needed for moderate pain.     Methoxy PEG-Epoetin Beta (MIRCERA IJ) Mircera      multivitamin (RENA-VIT) TABS tablet Take 1 tablet by mouth daily.     Netarsudil Dimesylate (RHOPRESSA) 0.02 % SOLN Place 1 drop into both eyes at bedtime.     oxyCODONE-acetaminophen (PERCOCET/ROXICET) 5-325 MG tablet Take 1 tablet by mouth every 6 (six) hours as needed. 10 tablet 0   sevelamer carbonate (RENVELA) 800 MG tablet Take 1,600-4,000 mg by mouth See admin instructions. Take 5 tablets (4000 mg) by mouth 2 -3 times daily with meals, take 2 tablets (1600 mg) with snacks     No current facility-administered medications for this visit.    On ROS today:negative unless stated in HPI   Physical Examination   Vitals:   08/17/21 1515  BP: 117/66  Pulse: 60  Resp: 20  Temp: 98 F (36.7 C)  SpO2: 100%  Weight: 134 lb (60.8 kg)  Height: 5\' 8"  (1.727 m)   Body mass index is 20.37 kg/m.   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 No ulcer. Skin is mobile overlying area. 2+ radial pulse, hand grip is 5/5, sensation in digits is intact, palpable thrill, bruit can  be auscultated. No blister is present. There is a residual dark area medial to the aneurysm but is resolving       Medical Decision  Making   Timothy Ponce is a 65 y.o. male who presents for evaluation of blister on right radiocephalic AV fistula. The blister is essentially healed. He has several aneurysmal areas present but they have been stable. He has some hypopigmentation in mid aneurysm but this too has been present for some time. It does not appear to be of concern at this time. Recommend continuing to avoid sticking the same site if possible. He has not had any bleeding issues and fistula is working well. He can follow up as needed     Karoline Caldwell, PA-C [facility]: [phone]  On call MD: Scot Dock

## 2021-08-18 DIAGNOSIS — N2581 Secondary hyperparathyroidism of renal origin: Secondary | ICD-10-CM | POA: Diagnosis not present

## 2021-08-18 DIAGNOSIS — N186 End stage renal disease: Secondary | ICD-10-CM | POA: Diagnosis not present

## 2021-08-18 DIAGNOSIS — Z992 Dependence on renal dialysis: Secondary | ICD-10-CM | POA: Diagnosis not present

## 2021-08-18 DIAGNOSIS — D509 Iron deficiency anemia, unspecified: Secondary | ICD-10-CM | POA: Diagnosis not present

## 2021-08-23 DIAGNOSIS — N2581 Secondary hyperparathyroidism of renal origin: Secondary | ICD-10-CM | POA: Diagnosis not present

## 2021-08-23 DIAGNOSIS — Z992 Dependence on renal dialysis: Secondary | ICD-10-CM | POA: Diagnosis not present

## 2021-08-23 DIAGNOSIS — D509 Iron deficiency anemia, unspecified: Secondary | ICD-10-CM | POA: Diagnosis not present

## 2021-08-23 DIAGNOSIS — N186 End stage renal disease: Secondary | ICD-10-CM | POA: Diagnosis not present

## 2021-08-25 DIAGNOSIS — N2581 Secondary hyperparathyroidism of renal origin: Secondary | ICD-10-CM | POA: Diagnosis not present

## 2021-08-25 DIAGNOSIS — D509 Iron deficiency anemia, unspecified: Secondary | ICD-10-CM | POA: Diagnosis not present

## 2021-08-25 DIAGNOSIS — Z992 Dependence on renal dialysis: Secondary | ICD-10-CM | POA: Diagnosis not present

## 2021-08-25 DIAGNOSIS — N186 End stage renal disease: Secondary | ICD-10-CM | POA: Diagnosis not present

## 2021-08-27 ENCOUNTER — Ambulatory Visit
Admission: RE | Admit: 2021-08-27 | Discharge: 2021-08-27 | Disposition: A | Discharge status: Home / Self Care | Payer: Medicare Other | Source: Ambulatory Visit | Attending: Orthopedic Surgery | Admitting: Orthopedic Surgery

## 2021-08-27 DIAGNOSIS — R2232 Localized swelling, mass and lump, left upper limb: Secondary | ICD-10-CM | POA: Diagnosis not present

## 2021-08-28 ENCOUNTER — Ambulatory Visit: Payer: Medicare Other | Admitting: Podiatry

## 2021-08-28 DIAGNOSIS — Z961 Presence of intraocular lens: Secondary | ICD-10-CM | POA: Diagnosis not present

## 2021-08-28 DIAGNOSIS — Z992 Dependence on renal dialysis: Secondary | ICD-10-CM | POA: Diagnosis not present

## 2021-08-28 DIAGNOSIS — H401133 Primary open-angle glaucoma, bilateral, severe stage: Secondary | ICD-10-CM | POA: Diagnosis not present

## 2021-08-28 DIAGNOSIS — N186 End stage renal disease: Secondary | ICD-10-CM | POA: Diagnosis not present

## 2021-08-28 DIAGNOSIS — H2512 Age-related nuclear cataract, left eye: Secondary | ICD-10-CM | POA: Diagnosis not present

## 2021-08-28 DIAGNOSIS — D509 Iron deficiency anemia, unspecified: Secondary | ICD-10-CM | POA: Diagnosis not present

## 2021-08-28 DIAGNOSIS — N2581 Secondary hyperparathyroidism of renal origin: Secondary | ICD-10-CM | POA: Diagnosis not present

## 2021-08-30 DIAGNOSIS — D509 Iron deficiency anemia, unspecified: Secondary | ICD-10-CM | POA: Diagnosis not present

## 2021-08-30 DIAGNOSIS — Z992 Dependence on renal dialysis: Secondary | ICD-10-CM | POA: Diagnosis not present

## 2021-08-30 DIAGNOSIS — N2581 Secondary hyperparathyroidism of renal origin: Secondary | ICD-10-CM | POA: Diagnosis not present

## 2021-08-30 DIAGNOSIS — N186 End stage renal disease: Secondary | ICD-10-CM | POA: Diagnosis not present

## 2021-09-01 DIAGNOSIS — D509 Iron deficiency anemia, unspecified: Secondary | ICD-10-CM | POA: Diagnosis not present

## 2021-09-01 DIAGNOSIS — N2581 Secondary hyperparathyroidism of renal origin: Secondary | ICD-10-CM | POA: Diagnosis not present

## 2021-09-01 DIAGNOSIS — Z992 Dependence on renal dialysis: Secondary | ICD-10-CM | POA: Diagnosis not present

## 2021-09-01 DIAGNOSIS — N186 End stage renal disease: Secondary | ICD-10-CM | POA: Diagnosis not present

## 2021-09-04 DIAGNOSIS — D509 Iron deficiency anemia, unspecified: Secondary | ICD-10-CM | POA: Diagnosis not present

## 2021-09-04 DIAGNOSIS — Z992 Dependence on renal dialysis: Secondary | ICD-10-CM | POA: Diagnosis not present

## 2021-09-04 DIAGNOSIS — N2581 Secondary hyperparathyroidism of renal origin: Secondary | ICD-10-CM | POA: Diagnosis not present

## 2021-09-04 DIAGNOSIS — N186 End stage renal disease: Secondary | ICD-10-CM | POA: Diagnosis not present

## 2021-09-08 DIAGNOSIS — N186 End stage renal disease: Secondary | ICD-10-CM | POA: Diagnosis not present

## 2021-09-08 DIAGNOSIS — Z992 Dependence on renal dialysis: Secondary | ICD-10-CM | POA: Diagnosis not present

## 2021-09-08 DIAGNOSIS — N2581 Secondary hyperparathyroidism of renal origin: Secondary | ICD-10-CM | POA: Diagnosis not present

## 2021-09-08 DIAGNOSIS — D509 Iron deficiency anemia, unspecified: Secondary | ICD-10-CM | POA: Diagnosis not present

## 2021-09-10 ENCOUNTER — Ambulatory Visit (INDEPENDENT_AMBULATORY_CARE_PROVIDER_SITE_OTHER): Payer: Medicare Other | Admitting: Podiatry

## 2021-09-10 ENCOUNTER — Encounter: Payer: Self-pay | Admitting: Podiatry

## 2021-09-10 ENCOUNTER — Other Ambulatory Visit: Payer: Self-pay

## 2021-09-10 DIAGNOSIS — I999 Unspecified disorder of circulatory system: Secondary | ICD-10-CM

## 2021-09-10 DIAGNOSIS — N186 End stage renal disease: Secondary | ICD-10-CM

## 2021-09-10 DIAGNOSIS — Z992 Dependence on renal dialysis: Secondary | ICD-10-CM | POA: Diagnosis not present

## 2021-09-10 DIAGNOSIS — I739 Peripheral vascular disease, unspecified: Secondary | ICD-10-CM | POA: Diagnosis not present

## 2021-09-10 DIAGNOSIS — B351 Tinea unguium: Secondary | ICD-10-CM

## 2021-09-10 DIAGNOSIS — M79674 Pain in right toe(s): Secondary | ICD-10-CM

## 2021-09-10 DIAGNOSIS — D689 Coagulation defect, unspecified: Secondary | ICD-10-CM

## 2021-09-10 DIAGNOSIS — M79675 Pain in left toe(s): Secondary | ICD-10-CM

## 2021-09-10 NOTE — Progress Notes (Signed)
This patient returns to my office for at risk foot care.  This patient requires this care by a professional since this patient will be at risk due to having ESRD, PAD,coagulation defect and amputation fifth toe right foot.  This patient is unable to cut nails himself since the patient cannot reach his nails.These nails are painful walking and wearing shoes.  This patient presents for at risk foot care today.  General Appearance  Alert, conversant and in no acute stress.  Vascular  Dorsalis pedis and posterior tibial  pulses are weakly palpable  bilaterally.  Capillary return is within normal limits  bilaterally. Cold feet  bilaterally. Absent digital hair  B/L.  Neurologic  Senn-Weinstein monofilament wire test within normal limits  bilaterally. Muscle power within normal limits bilaterally.  Nails Thick disfigured discolored nails with subungual debris  from hallux to fifth toes left and 1-4 right foot. No evidence of bacterial infection or drainage bilaterally.  Orthopedic  No limitations of motion  feet .  No crepitus or effusions noted.  No bony pathology or digital deformities noted.  Skin  normotropic skin with no porokeratosis noted bilaterally.  No signs of infections or ulcers noted.     Onychomycosis  Pain in right toes  Pain in left toes  Consent was obtained for treatment procedures.   Mechanical debridement of nails 1-5  left foot and 1-4 right foot. performed with a nail nipper.  Filed with dremel without incident.    Return office visit   3 months                   Told patient to return for periodic foot care and evaluation due to potential at risk complications.   Cynthia Cogle DPM   

## 2021-09-11 DIAGNOSIS — N186 End stage renal disease: Secondary | ICD-10-CM | POA: Diagnosis not present

## 2021-09-11 DIAGNOSIS — Z992 Dependence on renal dialysis: Secondary | ICD-10-CM | POA: Diagnosis not present

## 2021-09-11 DIAGNOSIS — N2581 Secondary hyperparathyroidism of renal origin: Secondary | ICD-10-CM | POA: Diagnosis not present

## 2021-09-11 DIAGNOSIS — D509 Iron deficiency anemia, unspecified: Secondary | ICD-10-CM | POA: Diagnosis not present

## 2021-09-13 DIAGNOSIS — G5603 Carpal tunnel syndrome, bilateral upper limbs: Secondary | ICD-10-CM | POA: Diagnosis not present

## 2021-09-13 DIAGNOSIS — Z992 Dependence on renal dialysis: Secondary | ICD-10-CM | POA: Diagnosis not present

## 2021-09-13 DIAGNOSIS — N186 End stage renal disease: Secondary | ICD-10-CM | POA: Diagnosis not present

## 2021-09-13 DIAGNOSIS — D509 Iron deficiency anemia, unspecified: Secondary | ICD-10-CM | POA: Diagnosis not present

## 2021-09-13 DIAGNOSIS — N2581 Secondary hyperparathyroidism of renal origin: Secondary | ICD-10-CM | POA: Diagnosis not present

## 2021-09-15 DIAGNOSIS — I120 Hypertensive chronic kidney disease with stage 5 chronic kidney disease or end stage renal disease: Secondary | ICD-10-CM | POA: Diagnosis not present

## 2021-09-15 DIAGNOSIS — Z992 Dependence on renal dialysis: Secondary | ICD-10-CM | POA: Diagnosis not present

## 2021-09-15 DIAGNOSIS — N186 End stage renal disease: Secondary | ICD-10-CM | POA: Diagnosis not present

## 2021-09-18 DIAGNOSIS — Z992 Dependence on renal dialysis: Secondary | ICD-10-CM | POA: Diagnosis not present

## 2021-09-18 DIAGNOSIS — N2581 Secondary hyperparathyroidism of renal origin: Secondary | ICD-10-CM | POA: Diagnosis not present

## 2021-09-18 DIAGNOSIS — D509 Iron deficiency anemia, unspecified: Secondary | ICD-10-CM | POA: Diagnosis not present

## 2021-09-18 DIAGNOSIS — N186 End stage renal disease: Secondary | ICD-10-CM | POA: Diagnosis not present

## 2021-09-18 DIAGNOSIS — D689 Coagulation defect, unspecified: Secondary | ICD-10-CM | POA: Diagnosis not present

## 2021-09-20 DIAGNOSIS — Z992 Dependence on renal dialysis: Secondary | ICD-10-CM | POA: Diagnosis not present

## 2021-09-20 DIAGNOSIS — D509 Iron deficiency anemia, unspecified: Secondary | ICD-10-CM | POA: Diagnosis not present

## 2021-09-20 DIAGNOSIS — D689 Coagulation defect, unspecified: Secondary | ICD-10-CM | POA: Diagnosis not present

## 2021-09-20 DIAGNOSIS — N186 End stage renal disease: Secondary | ICD-10-CM | POA: Diagnosis not present

## 2021-09-20 DIAGNOSIS — N2581 Secondary hyperparathyroidism of renal origin: Secondary | ICD-10-CM | POA: Diagnosis not present

## 2021-09-22 DIAGNOSIS — D509 Iron deficiency anemia, unspecified: Secondary | ICD-10-CM | POA: Diagnosis not present

## 2021-09-22 DIAGNOSIS — N2581 Secondary hyperparathyroidism of renal origin: Secondary | ICD-10-CM | POA: Diagnosis not present

## 2021-09-22 DIAGNOSIS — D689 Coagulation defect, unspecified: Secondary | ICD-10-CM | POA: Diagnosis not present

## 2021-09-22 DIAGNOSIS — Z992 Dependence on renal dialysis: Secondary | ICD-10-CM | POA: Diagnosis not present

## 2021-09-22 DIAGNOSIS — N186 End stage renal disease: Secondary | ICD-10-CM | POA: Diagnosis not present

## 2021-09-24 DIAGNOSIS — G5621 Lesion of ulnar nerve, right upper limb: Secondary | ICD-10-CM | POA: Diagnosis not present

## 2021-09-24 DIAGNOSIS — G5601 Carpal tunnel syndrome, right upper limb: Secondary | ICD-10-CM | POA: Diagnosis not present

## 2021-09-25 DIAGNOSIS — N186 End stage renal disease: Secondary | ICD-10-CM | POA: Diagnosis not present

## 2021-09-25 DIAGNOSIS — D689 Coagulation defect, unspecified: Secondary | ICD-10-CM | POA: Diagnosis not present

## 2021-09-25 DIAGNOSIS — D509 Iron deficiency anemia, unspecified: Secondary | ICD-10-CM | POA: Diagnosis not present

## 2021-09-25 DIAGNOSIS — Z992 Dependence on renal dialysis: Secondary | ICD-10-CM | POA: Diagnosis not present

## 2021-09-25 DIAGNOSIS — N2581 Secondary hyperparathyroidism of renal origin: Secondary | ICD-10-CM | POA: Diagnosis not present

## 2021-09-27 DIAGNOSIS — D509 Iron deficiency anemia, unspecified: Secondary | ICD-10-CM | POA: Diagnosis not present

## 2021-09-27 DIAGNOSIS — Z992 Dependence on renal dialysis: Secondary | ICD-10-CM | POA: Diagnosis not present

## 2021-09-27 DIAGNOSIS — D689 Coagulation defect, unspecified: Secondary | ICD-10-CM | POA: Diagnosis not present

## 2021-09-27 DIAGNOSIS — N2581 Secondary hyperparathyroidism of renal origin: Secondary | ICD-10-CM | POA: Diagnosis not present

## 2021-09-27 DIAGNOSIS — N186 End stage renal disease: Secondary | ICD-10-CM | POA: Diagnosis not present

## 2021-09-29 DIAGNOSIS — N186 End stage renal disease: Secondary | ICD-10-CM | POA: Diagnosis not present

## 2021-09-29 DIAGNOSIS — D689 Coagulation defect, unspecified: Secondary | ICD-10-CM | POA: Diagnosis not present

## 2021-09-29 DIAGNOSIS — Z992 Dependence on renal dialysis: Secondary | ICD-10-CM | POA: Diagnosis not present

## 2021-09-29 DIAGNOSIS — N2581 Secondary hyperparathyroidism of renal origin: Secondary | ICD-10-CM | POA: Diagnosis not present

## 2021-09-29 DIAGNOSIS — D509 Iron deficiency anemia, unspecified: Secondary | ICD-10-CM | POA: Diagnosis not present

## 2021-10-04 DIAGNOSIS — D689 Coagulation defect, unspecified: Secondary | ICD-10-CM | POA: Diagnosis not present

## 2021-10-04 DIAGNOSIS — Z992 Dependence on renal dialysis: Secondary | ICD-10-CM | POA: Diagnosis not present

## 2021-10-04 DIAGNOSIS — N186 End stage renal disease: Secondary | ICD-10-CM | POA: Diagnosis not present

## 2021-10-04 DIAGNOSIS — N2581 Secondary hyperparathyroidism of renal origin: Secondary | ICD-10-CM | POA: Diagnosis not present

## 2021-10-04 DIAGNOSIS — D509 Iron deficiency anemia, unspecified: Secondary | ICD-10-CM | POA: Diagnosis not present

## 2021-10-06 DIAGNOSIS — N2581 Secondary hyperparathyroidism of renal origin: Secondary | ICD-10-CM | POA: Diagnosis not present

## 2021-10-06 DIAGNOSIS — Z992 Dependence on renal dialysis: Secondary | ICD-10-CM | POA: Diagnosis not present

## 2021-10-06 DIAGNOSIS — D509 Iron deficiency anemia, unspecified: Secondary | ICD-10-CM | POA: Diagnosis not present

## 2021-10-06 DIAGNOSIS — N186 End stage renal disease: Secondary | ICD-10-CM | POA: Diagnosis not present

## 2021-10-06 DIAGNOSIS — D689 Coagulation defect, unspecified: Secondary | ICD-10-CM | POA: Diagnosis not present

## 2021-10-09 DIAGNOSIS — D689 Coagulation defect, unspecified: Secondary | ICD-10-CM | POA: Diagnosis not present

## 2021-10-09 DIAGNOSIS — D509 Iron deficiency anemia, unspecified: Secondary | ICD-10-CM | POA: Diagnosis not present

## 2021-10-09 DIAGNOSIS — N2581 Secondary hyperparathyroidism of renal origin: Secondary | ICD-10-CM | POA: Diagnosis not present

## 2021-10-09 DIAGNOSIS — Z992 Dependence on renal dialysis: Secondary | ICD-10-CM | POA: Diagnosis not present

## 2021-10-09 DIAGNOSIS — N186 End stage renal disease: Secondary | ICD-10-CM | POA: Diagnosis not present

## 2021-10-11 DIAGNOSIS — Z992 Dependence on renal dialysis: Secondary | ICD-10-CM | POA: Diagnosis not present

## 2021-10-11 DIAGNOSIS — N186 End stage renal disease: Secondary | ICD-10-CM | POA: Diagnosis not present

## 2021-10-11 DIAGNOSIS — N2581 Secondary hyperparathyroidism of renal origin: Secondary | ICD-10-CM | POA: Diagnosis not present

## 2021-10-11 DIAGNOSIS — D689 Coagulation defect, unspecified: Secondary | ICD-10-CM | POA: Diagnosis not present

## 2021-10-11 DIAGNOSIS — D509 Iron deficiency anemia, unspecified: Secondary | ICD-10-CM | POA: Diagnosis not present

## 2021-10-13 DIAGNOSIS — Z992 Dependence on renal dialysis: Secondary | ICD-10-CM | POA: Diagnosis not present

## 2021-10-13 DIAGNOSIS — N186 End stage renal disease: Secondary | ICD-10-CM | POA: Diagnosis not present

## 2021-10-13 DIAGNOSIS — N2581 Secondary hyperparathyroidism of renal origin: Secondary | ICD-10-CM | POA: Diagnosis not present

## 2021-10-13 DIAGNOSIS — D509 Iron deficiency anemia, unspecified: Secondary | ICD-10-CM | POA: Diagnosis not present

## 2021-10-13 DIAGNOSIS — D689 Coagulation defect, unspecified: Secondary | ICD-10-CM | POA: Diagnosis not present

## 2021-10-16 DIAGNOSIS — I120 Hypertensive chronic kidney disease with stage 5 chronic kidney disease or end stage renal disease: Secondary | ICD-10-CM | POA: Diagnosis not present

## 2021-10-16 DIAGNOSIS — Z992 Dependence on renal dialysis: Secondary | ICD-10-CM | POA: Diagnosis not present

## 2021-10-16 DIAGNOSIS — N2581 Secondary hyperparathyroidism of renal origin: Secondary | ICD-10-CM | POA: Diagnosis not present

## 2021-10-16 DIAGNOSIS — N186 End stage renal disease: Secondary | ICD-10-CM | POA: Diagnosis not present

## 2021-10-16 DIAGNOSIS — Z23 Encounter for immunization: Secondary | ICD-10-CM | POA: Diagnosis not present

## 2021-10-16 DIAGNOSIS — D509 Iron deficiency anemia, unspecified: Secondary | ICD-10-CM | POA: Diagnosis not present

## 2021-10-16 DIAGNOSIS — D689 Coagulation defect, unspecified: Secondary | ICD-10-CM | POA: Diagnosis not present

## 2021-10-18 DIAGNOSIS — N2581 Secondary hyperparathyroidism of renal origin: Secondary | ICD-10-CM | POA: Diagnosis not present

## 2021-10-18 DIAGNOSIS — Z23 Encounter for immunization: Secondary | ICD-10-CM | POA: Diagnosis not present

## 2021-10-18 DIAGNOSIS — D509 Iron deficiency anemia, unspecified: Secondary | ICD-10-CM | POA: Diagnosis not present

## 2021-10-18 DIAGNOSIS — N186 End stage renal disease: Secondary | ICD-10-CM | POA: Diagnosis not present

## 2021-10-18 DIAGNOSIS — D689 Coagulation defect, unspecified: Secondary | ICD-10-CM | POA: Diagnosis not present

## 2021-10-18 DIAGNOSIS — Z992 Dependence on renal dialysis: Secondary | ICD-10-CM | POA: Diagnosis not present

## 2021-10-20 DIAGNOSIS — D689 Coagulation defect, unspecified: Secondary | ICD-10-CM | POA: Diagnosis not present

## 2021-10-20 DIAGNOSIS — Z992 Dependence on renal dialysis: Secondary | ICD-10-CM | POA: Diagnosis not present

## 2021-10-20 DIAGNOSIS — D509 Iron deficiency anemia, unspecified: Secondary | ICD-10-CM | POA: Diagnosis not present

## 2021-10-20 DIAGNOSIS — N186 End stage renal disease: Secondary | ICD-10-CM | POA: Diagnosis not present

## 2021-10-20 DIAGNOSIS — N2581 Secondary hyperparathyroidism of renal origin: Secondary | ICD-10-CM | POA: Diagnosis not present

## 2021-10-20 DIAGNOSIS — Z23 Encounter for immunization: Secondary | ICD-10-CM | POA: Diagnosis not present

## 2021-10-23 DIAGNOSIS — Z992 Dependence on renal dialysis: Secondary | ICD-10-CM | POA: Diagnosis not present

## 2021-10-23 DIAGNOSIS — Z23 Encounter for immunization: Secondary | ICD-10-CM | POA: Diagnosis not present

## 2021-10-23 DIAGNOSIS — N186 End stage renal disease: Secondary | ICD-10-CM | POA: Diagnosis not present

## 2021-10-23 DIAGNOSIS — D509 Iron deficiency anemia, unspecified: Secondary | ICD-10-CM | POA: Diagnosis not present

## 2021-10-23 DIAGNOSIS — D689 Coagulation defect, unspecified: Secondary | ICD-10-CM | POA: Diagnosis not present

## 2021-10-23 DIAGNOSIS — N2581 Secondary hyperparathyroidism of renal origin: Secondary | ICD-10-CM | POA: Diagnosis not present

## 2021-10-25 DIAGNOSIS — D689 Coagulation defect, unspecified: Secondary | ICD-10-CM | POA: Diagnosis not present

## 2021-10-25 DIAGNOSIS — Z23 Encounter for immunization: Secondary | ICD-10-CM | POA: Diagnosis not present

## 2021-10-25 DIAGNOSIS — Z992 Dependence on renal dialysis: Secondary | ICD-10-CM | POA: Diagnosis not present

## 2021-10-25 DIAGNOSIS — N2581 Secondary hyperparathyroidism of renal origin: Secondary | ICD-10-CM | POA: Diagnosis not present

## 2021-10-25 DIAGNOSIS — N186 End stage renal disease: Secondary | ICD-10-CM | POA: Diagnosis not present

## 2021-10-25 DIAGNOSIS — D509 Iron deficiency anemia, unspecified: Secondary | ICD-10-CM | POA: Diagnosis not present

## 2021-10-27 DIAGNOSIS — Z992 Dependence on renal dialysis: Secondary | ICD-10-CM | POA: Diagnosis not present

## 2021-10-27 DIAGNOSIS — N186 End stage renal disease: Secondary | ICD-10-CM | POA: Diagnosis not present

## 2021-10-27 DIAGNOSIS — N2581 Secondary hyperparathyroidism of renal origin: Secondary | ICD-10-CM | POA: Diagnosis not present

## 2021-10-27 DIAGNOSIS — D509 Iron deficiency anemia, unspecified: Secondary | ICD-10-CM | POA: Diagnosis not present

## 2021-10-27 DIAGNOSIS — Z23 Encounter for immunization: Secondary | ICD-10-CM | POA: Diagnosis not present

## 2021-10-27 DIAGNOSIS — D689 Coagulation defect, unspecified: Secondary | ICD-10-CM | POA: Diagnosis not present

## 2021-10-30 DIAGNOSIS — Z992 Dependence on renal dialysis: Secondary | ICD-10-CM | POA: Diagnosis not present

## 2021-10-30 DIAGNOSIS — N186 End stage renal disease: Secondary | ICD-10-CM | POA: Diagnosis not present

## 2021-10-30 DIAGNOSIS — D689 Coagulation defect, unspecified: Secondary | ICD-10-CM | POA: Diagnosis not present

## 2021-10-30 DIAGNOSIS — D509 Iron deficiency anemia, unspecified: Secondary | ICD-10-CM | POA: Diagnosis not present

## 2021-10-30 DIAGNOSIS — Z23 Encounter for immunization: Secondary | ICD-10-CM | POA: Diagnosis not present

## 2021-10-30 DIAGNOSIS — N2581 Secondary hyperparathyroidism of renal origin: Secondary | ICD-10-CM | POA: Diagnosis not present

## 2021-11-03 DIAGNOSIS — Z23 Encounter for immunization: Secondary | ICD-10-CM | POA: Diagnosis not present

## 2021-11-03 DIAGNOSIS — N186 End stage renal disease: Secondary | ICD-10-CM | POA: Diagnosis not present

## 2021-11-03 DIAGNOSIS — D509 Iron deficiency anemia, unspecified: Secondary | ICD-10-CM | POA: Diagnosis not present

## 2021-11-03 DIAGNOSIS — Z992 Dependence on renal dialysis: Secondary | ICD-10-CM | POA: Diagnosis not present

## 2021-11-03 DIAGNOSIS — N2581 Secondary hyperparathyroidism of renal origin: Secondary | ICD-10-CM | POA: Diagnosis not present

## 2021-11-03 DIAGNOSIS — D689 Coagulation defect, unspecified: Secondary | ICD-10-CM | POA: Diagnosis not present

## 2021-11-05 DIAGNOSIS — M79641 Pain in right hand: Secondary | ICD-10-CM | POA: Diagnosis not present

## 2021-11-05 DIAGNOSIS — M79642 Pain in left hand: Secondary | ICD-10-CM | POA: Diagnosis not present

## 2021-11-05 DIAGNOSIS — G5601 Carpal tunnel syndrome, right upper limb: Secondary | ICD-10-CM | POA: Diagnosis not present

## 2021-11-06 DIAGNOSIS — Z992 Dependence on renal dialysis: Secondary | ICD-10-CM | POA: Diagnosis not present

## 2021-11-06 DIAGNOSIS — Z23 Encounter for immunization: Secondary | ICD-10-CM | POA: Diagnosis not present

## 2021-11-06 DIAGNOSIS — D689 Coagulation defect, unspecified: Secondary | ICD-10-CM | POA: Diagnosis not present

## 2021-11-06 DIAGNOSIS — N186 End stage renal disease: Secondary | ICD-10-CM | POA: Diagnosis not present

## 2021-11-06 DIAGNOSIS — N2581 Secondary hyperparathyroidism of renal origin: Secondary | ICD-10-CM | POA: Diagnosis not present

## 2021-11-06 DIAGNOSIS — D509 Iron deficiency anemia, unspecified: Secondary | ICD-10-CM | POA: Diagnosis not present

## 2021-11-09 DIAGNOSIS — D689 Coagulation defect, unspecified: Secondary | ICD-10-CM | POA: Diagnosis not present

## 2021-11-09 DIAGNOSIS — Z992 Dependence on renal dialysis: Secondary | ICD-10-CM | POA: Diagnosis not present

## 2021-11-09 DIAGNOSIS — Z23 Encounter for immunization: Secondary | ICD-10-CM | POA: Diagnosis not present

## 2021-11-09 DIAGNOSIS — N2581 Secondary hyperparathyroidism of renal origin: Secondary | ICD-10-CM | POA: Diagnosis not present

## 2021-11-09 DIAGNOSIS — D509 Iron deficiency anemia, unspecified: Secondary | ICD-10-CM | POA: Diagnosis not present

## 2021-11-09 DIAGNOSIS — N186 End stage renal disease: Secondary | ICD-10-CM | POA: Diagnosis not present

## 2021-11-13 DIAGNOSIS — N186 End stage renal disease: Secondary | ICD-10-CM | POA: Diagnosis not present

## 2021-11-13 DIAGNOSIS — D509 Iron deficiency anemia, unspecified: Secondary | ICD-10-CM | POA: Diagnosis not present

## 2021-11-13 DIAGNOSIS — D689 Coagulation defect, unspecified: Secondary | ICD-10-CM | POA: Diagnosis not present

## 2021-11-13 DIAGNOSIS — Z23 Encounter for immunization: Secondary | ICD-10-CM | POA: Diagnosis not present

## 2021-11-13 DIAGNOSIS — N2581 Secondary hyperparathyroidism of renal origin: Secondary | ICD-10-CM | POA: Diagnosis not present

## 2021-11-13 DIAGNOSIS — Z992 Dependence on renal dialysis: Secondary | ICD-10-CM | POA: Diagnosis not present

## 2021-11-15 DIAGNOSIS — D509 Iron deficiency anemia, unspecified: Secondary | ICD-10-CM | POA: Diagnosis not present

## 2021-11-15 DIAGNOSIS — D689 Coagulation defect, unspecified: Secondary | ICD-10-CM | POA: Diagnosis not present

## 2021-11-15 DIAGNOSIS — N186 End stage renal disease: Secondary | ICD-10-CM | POA: Diagnosis not present

## 2021-11-15 DIAGNOSIS — D631 Anemia in chronic kidney disease: Secondary | ICD-10-CM | POA: Diagnosis not present

## 2021-11-15 DIAGNOSIS — Z992 Dependence on renal dialysis: Secondary | ICD-10-CM | POA: Diagnosis not present

## 2021-11-15 DIAGNOSIS — I120 Hypertensive chronic kidney disease with stage 5 chronic kidney disease or end stage renal disease: Secondary | ICD-10-CM | POA: Diagnosis not present

## 2021-11-15 DIAGNOSIS — N2581 Secondary hyperparathyroidism of renal origin: Secondary | ICD-10-CM | POA: Diagnosis not present

## 2021-11-17 DIAGNOSIS — D689 Coagulation defect, unspecified: Secondary | ICD-10-CM | POA: Diagnosis not present

## 2021-11-17 DIAGNOSIS — N2581 Secondary hyperparathyroidism of renal origin: Secondary | ICD-10-CM | POA: Diagnosis not present

## 2021-11-17 DIAGNOSIS — D509 Iron deficiency anemia, unspecified: Secondary | ICD-10-CM | POA: Diagnosis not present

## 2021-11-17 DIAGNOSIS — Z992 Dependence on renal dialysis: Secondary | ICD-10-CM | POA: Diagnosis not present

## 2021-11-17 DIAGNOSIS — N186 End stage renal disease: Secondary | ICD-10-CM | POA: Diagnosis not present

## 2021-11-17 DIAGNOSIS — D631 Anemia in chronic kidney disease: Secondary | ICD-10-CM | POA: Diagnosis not present

## 2021-11-20 DIAGNOSIS — Z992 Dependence on renal dialysis: Secondary | ICD-10-CM | POA: Diagnosis not present

## 2021-11-20 DIAGNOSIS — D509 Iron deficiency anemia, unspecified: Secondary | ICD-10-CM | POA: Diagnosis not present

## 2021-11-20 DIAGNOSIS — N2581 Secondary hyperparathyroidism of renal origin: Secondary | ICD-10-CM | POA: Diagnosis not present

## 2021-11-20 DIAGNOSIS — N186 End stage renal disease: Secondary | ICD-10-CM | POA: Diagnosis not present

## 2021-11-20 DIAGNOSIS — D689 Coagulation defect, unspecified: Secondary | ICD-10-CM | POA: Diagnosis not present

## 2021-11-20 DIAGNOSIS — D631 Anemia in chronic kidney disease: Secondary | ICD-10-CM | POA: Diagnosis not present

## 2021-11-24 DIAGNOSIS — D689 Coagulation defect, unspecified: Secondary | ICD-10-CM | POA: Diagnosis not present

## 2021-11-24 DIAGNOSIS — D509 Iron deficiency anemia, unspecified: Secondary | ICD-10-CM | POA: Diagnosis not present

## 2021-11-24 DIAGNOSIS — Z992 Dependence on renal dialysis: Secondary | ICD-10-CM | POA: Diagnosis not present

## 2021-11-24 DIAGNOSIS — D631 Anemia in chronic kidney disease: Secondary | ICD-10-CM | POA: Diagnosis not present

## 2021-11-24 DIAGNOSIS — N2581 Secondary hyperparathyroidism of renal origin: Secondary | ICD-10-CM | POA: Diagnosis not present

## 2021-11-24 DIAGNOSIS — N186 End stage renal disease: Secondary | ICD-10-CM | POA: Diagnosis not present

## 2021-11-27 DIAGNOSIS — Z992 Dependence on renal dialysis: Secondary | ICD-10-CM | POA: Diagnosis not present

## 2021-11-27 DIAGNOSIS — D509 Iron deficiency anemia, unspecified: Secondary | ICD-10-CM | POA: Diagnosis not present

## 2021-11-27 DIAGNOSIS — N186 End stage renal disease: Secondary | ICD-10-CM | POA: Diagnosis not present

## 2021-11-27 DIAGNOSIS — H2512 Age-related nuclear cataract, left eye: Secondary | ICD-10-CM | POA: Diagnosis not present

## 2021-11-27 DIAGNOSIS — Z961 Presence of intraocular lens: Secondary | ICD-10-CM | POA: Diagnosis not present

## 2021-11-27 DIAGNOSIS — H401133 Primary open-angle glaucoma, bilateral, severe stage: Secondary | ICD-10-CM | POA: Diagnosis not present

## 2021-11-27 DIAGNOSIS — N2581 Secondary hyperparathyroidism of renal origin: Secondary | ICD-10-CM | POA: Diagnosis not present

## 2021-11-27 DIAGNOSIS — D631 Anemia in chronic kidney disease: Secondary | ICD-10-CM | POA: Diagnosis not present

## 2021-11-27 DIAGNOSIS — D689 Coagulation defect, unspecified: Secondary | ICD-10-CM | POA: Diagnosis not present

## 2021-12-01 DIAGNOSIS — D689 Coagulation defect, unspecified: Secondary | ICD-10-CM | POA: Diagnosis not present

## 2021-12-01 DIAGNOSIS — N186 End stage renal disease: Secondary | ICD-10-CM | POA: Diagnosis not present

## 2021-12-01 DIAGNOSIS — Z992 Dependence on renal dialysis: Secondary | ICD-10-CM | POA: Diagnosis not present

## 2021-12-01 DIAGNOSIS — D631 Anemia in chronic kidney disease: Secondary | ICD-10-CM | POA: Diagnosis not present

## 2021-12-01 DIAGNOSIS — N2581 Secondary hyperparathyroidism of renal origin: Secondary | ICD-10-CM | POA: Diagnosis not present

## 2021-12-01 DIAGNOSIS — D509 Iron deficiency anemia, unspecified: Secondary | ICD-10-CM | POA: Diagnosis not present

## 2021-12-04 DIAGNOSIS — D509 Iron deficiency anemia, unspecified: Secondary | ICD-10-CM | POA: Diagnosis not present

## 2021-12-04 DIAGNOSIS — Z992 Dependence on renal dialysis: Secondary | ICD-10-CM | POA: Diagnosis not present

## 2021-12-04 DIAGNOSIS — D689 Coagulation defect, unspecified: Secondary | ICD-10-CM | POA: Diagnosis not present

## 2021-12-04 DIAGNOSIS — N186 End stage renal disease: Secondary | ICD-10-CM | POA: Diagnosis not present

## 2021-12-04 DIAGNOSIS — N2581 Secondary hyperparathyroidism of renal origin: Secondary | ICD-10-CM | POA: Diagnosis not present

## 2021-12-04 DIAGNOSIS — D631 Anemia in chronic kidney disease: Secondary | ICD-10-CM | POA: Diagnosis not present

## 2021-12-06 DIAGNOSIS — D509 Iron deficiency anemia, unspecified: Secondary | ICD-10-CM | POA: Diagnosis not present

## 2021-12-06 DIAGNOSIS — D631 Anemia in chronic kidney disease: Secondary | ICD-10-CM | POA: Diagnosis not present

## 2021-12-06 DIAGNOSIS — N186 End stage renal disease: Secondary | ICD-10-CM | POA: Diagnosis not present

## 2021-12-06 DIAGNOSIS — D689 Coagulation defect, unspecified: Secondary | ICD-10-CM | POA: Diagnosis not present

## 2021-12-06 DIAGNOSIS — Z992 Dependence on renal dialysis: Secondary | ICD-10-CM | POA: Diagnosis not present

## 2021-12-06 DIAGNOSIS — N2581 Secondary hyperparathyroidism of renal origin: Secondary | ICD-10-CM | POA: Diagnosis not present

## 2021-12-11 ENCOUNTER — Encounter: Payer: Self-pay | Admitting: Podiatry

## 2021-12-11 ENCOUNTER — Ambulatory Visit (INDEPENDENT_AMBULATORY_CARE_PROVIDER_SITE_OTHER): Payer: Medicare Other | Admitting: Podiatry

## 2021-12-11 ENCOUNTER — Other Ambulatory Visit: Payer: Self-pay

## 2021-12-11 DIAGNOSIS — D631 Anemia in chronic kidney disease: Secondary | ICD-10-CM | POA: Diagnosis not present

## 2021-12-11 DIAGNOSIS — N186 End stage renal disease: Secondary | ICD-10-CM

## 2021-12-11 DIAGNOSIS — Z992 Dependence on renal dialysis: Secondary | ICD-10-CM

## 2021-12-11 DIAGNOSIS — D509 Iron deficiency anemia, unspecified: Secondary | ICD-10-CM | POA: Diagnosis not present

## 2021-12-11 DIAGNOSIS — N2581 Secondary hyperparathyroidism of renal origin: Secondary | ICD-10-CM | POA: Diagnosis not present

## 2021-12-11 DIAGNOSIS — M79675 Pain in left toe(s): Secondary | ICD-10-CM

## 2021-12-11 DIAGNOSIS — D689 Coagulation defect, unspecified: Secondary | ICD-10-CM

## 2021-12-11 DIAGNOSIS — B351 Tinea unguium: Secondary | ICD-10-CM

## 2021-12-11 DIAGNOSIS — M79674 Pain in right toe(s): Secondary | ICD-10-CM

## 2021-12-11 DIAGNOSIS — I999 Unspecified disorder of circulatory system: Secondary | ICD-10-CM

## 2021-12-11 DIAGNOSIS — I739 Peripheral vascular disease, unspecified: Secondary | ICD-10-CM | POA: Diagnosis not present

## 2021-12-11 NOTE — Progress Notes (Signed)
This patient returns to my office for at risk foot care.  This patient requires this care by a professional since this patient will be at risk due to having ESRD, PAD,coagulation defect and amputation fifth toe right foot.  This patient is unable to cut nails himself since the patient cannot reach his nails.These nails are painful walking and wearing shoes.  This patient presents for at risk foot care today.  General Appearance  Alert, conversant and in no acute stress.  Vascular  Dorsalis pedis and posterior tibial  pulses are weakly palpable  bilaterally.  Capillary return is within normal limits  bilaterally. Cold feet  bilaterally. Absent digital hair  B/L.  Neurologic  Senn-Weinstein monofilament wire test within normal limits  bilaterally. Muscle power within normal limits bilaterally.  Nails Thick disfigured discolored nails with subungual debris  from hallux to fifth toes left and 1-4 right foot. No evidence of bacterial infection or drainage bilaterally.  Orthopedic  No limitations of motion  feet .  No crepitus or effusions noted.  No bony pathology or digital deformities noted.  Skin  normotropic skin with no porokeratosis noted bilaterally.  No signs of infections or ulcers noted.     Onychomycosis  Pain in right toes  Pain in left toes  Consent was obtained for treatment procedures.   Mechanical debridement of nails 1-5  left foot and 1-4 right foot. performed with a nail nipper.  Filed with dremel without incident.    Return office visit   3 months                   Told patient to return for periodic foot care and evaluation due to potential at risk complications.   Dashiell Franchino DPM   

## 2021-12-13 DIAGNOSIS — D509 Iron deficiency anemia, unspecified: Secondary | ICD-10-CM | POA: Diagnosis not present

## 2021-12-13 DIAGNOSIS — D631 Anemia in chronic kidney disease: Secondary | ICD-10-CM | POA: Diagnosis not present

## 2021-12-13 DIAGNOSIS — D689 Coagulation defect, unspecified: Secondary | ICD-10-CM | POA: Diagnosis not present

## 2021-12-13 DIAGNOSIS — N2581 Secondary hyperparathyroidism of renal origin: Secondary | ICD-10-CM | POA: Diagnosis not present

## 2021-12-13 DIAGNOSIS — Z992 Dependence on renal dialysis: Secondary | ICD-10-CM | POA: Diagnosis not present

## 2021-12-13 DIAGNOSIS — N186 End stage renal disease: Secondary | ICD-10-CM | POA: Diagnosis not present

## 2021-12-15 DIAGNOSIS — Z992 Dependence on renal dialysis: Secondary | ICD-10-CM | POA: Diagnosis not present

## 2021-12-15 DIAGNOSIS — D689 Coagulation defect, unspecified: Secondary | ICD-10-CM | POA: Diagnosis not present

## 2021-12-15 DIAGNOSIS — D631 Anemia in chronic kidney disease: Secondary | ICD-10-CM | POA: Diagnosis not present

## 2021-12-15 DIAGNOSIS — D509 Iron deficiency anemia, unspecified: Secondary | ICD-10-CM | POA: Diagnosis not present

## 2021-12-15 DIAGNOSIS — N186 End stage renal disease: Secondary | ICD-10-CM | POA: Diagnosis not present

## 2021-12-15 DIAGNOSIS — N2581 Secondary hyperparathyroidism of renal origin: Secondary | ICD-10-CM | POA: Diagnosis not present

## 2021-12-16 DIAGNOSIS — N186 End stage renal disease: Secondary | ICD-10-CM | POA: Diagnosis not present

## 2021-12-16 DIAGNOSIS — I120 Hypertensive chronic kidney disease with stage 5 chronic kidney disease or end stage renal disease: Secondary | ICD-10-CM | POA: Diagnosis not present

## 2021-12-16 DIAGNOSIS — Z992 Dependence on renal dialysis: Secondary | ICD-10-CM | POA: Diagnosis not present

## 2021-12-18 DIAGNOSIS — N2581 Secondary hyperparathyroidism of renal origin: Secondary | ICD-10-CM | POA: Diagnosis not present

## 2021-12-18 DIAGNOSIS — D689 Coagulation defect, unspecified: Secondary | ICD-10-CM | POA: Diagnosis not present

## 2021-12-18 DIAGNOSIS — Z992 Dependence on renal dialysis: Secondary | ICD-10-CM | POA: Diagnosis not present

## 2021-12-18 DIAGNOSIS — N186 End stage renal disease: Secondary | ICD-10-CM | POA: Diagnosis not present

## 2021-12-18 DIAGNOSIS — D509 Iron deficiency anemia, unspecified: Secondary | ICD-10-CM | POA: Diagnosis not present

## 2021-12-20 DIAGNOSIS — D509 Iron deficiency anemia, unspecified: Secondary | ICD-10-CM | POA: Diagnosis not present

## 2021-12-20 DIAGNOSIS — N186 End stage renal disease: Secondary | ICD-10-CM | POA: Diagnosis not present

## 2021-12-20 DIAGNOSIS — Z992 Dependence on renal dialysis: Secondary | ICD-10-CM | POA: Diagnosis not present

## 2021-12-20 DIAGNOSIS — D689 Coagulation defect, unspecified: Secondary | ICD-10-CM | POA: Diagnosis not present

## 2021-12-20 DIAGNOSIS — N2581 Secondary hyperparathyroidism of renal origin: Secondary | ICD-10-CM | POA: Diagnosis not present

## 2021-12-25 DIAGNOSIS — D689 Coagulation defect, unspecified: Secondary | ICD-10-CM | POA: Diagnosis not present

## 2021-12-25 DIAGNOSIS — N2581 Secondary hyperparathyroidism of renal origin: Secondary | ICD-10-CM | POA: Diagnosis not present

## 2021-12-25 DIAGNOSIS — D509 Iron deficiency anemia, unspecified: Secondary | ICD-10-CM | POA: Diagnosis not present

## 2021-12-25 DIAGNOSIS — N186 End stage renal disease: Secondary | ICD-10-CM | POA: Diagnosis not present

## 2021-12-25 DIAGNOSIS — Z992 Dependence on renal dialysis: Secondary | ICD-10-CM | POA: Diagnosis not present

## 2021-12-27 DIAGNOSIS — D509 Iron deficiency anemia, unspecified: Secondary | ICD-10-CM | POA: Diagnosis not present

## 2021-12-27 DIAGNOSIS — Z992 Dependence on renal dialysis: Secondary | ICD-10-CM | POA: Diagnosis not present

## 2021-12-27 DIAGNOSIS — N186 End stage renal disease: Secondary | ICD-10-CM | POA: Diagnosis not present

## 2021-12-27 DIAGNOSIS — D689 Coagulation defect, unspecified: Secondary | ICD-10-CM | POA: Diagnosis not present

## 2021-12-27 DIAGNOSIS — N2581 Secondary hyperparathyroidism of renal origin: Secondary | ICD-10-CM | POA: Diagnosis not present

## 2021-12-29 DIAGNOSIS — D689 Coagulation defect, unspecified: Secondary | ICD-10-CM | POA: Diagnosis not present

## 2021-12-29 DIAGNOSIS — D509 Iron deficiency anemia, unspecified: Secondary | ICD-10-CM | POA: Diagnosis not present

## 2021-12-29 DIAGNOSIS — N186 End stage renal disease: Secondary | ICD-10-CM | POA: Diagnosis not present

## 2021-12-29 DIAGNOSIS — Z992 Dependence on renal dialysis: Secondary | ICD-10-CM | POA: Diagnosis not present

## 2021-12-29 DIAGNOSIS — N2581 Secondary hyperparathyroidism of renal origin: Secondary | ICD-10-CM | POA: Diagnosis not present

## 2022-01-01 DIAGNOSIS — D509 Iron deficiency anemia, unspecified: Secondary | ICD-10-CM | POA: Diagnosis not present

## 2022-01-01 DIAGNOSIS — Z992 Dependence on renal dialysis: Secondary | ICD-10-CM | POA: Diagnosis not present

## 2022-01-01 DIAGNOSIS — N186 End stage renal disease: Secondary | ICD-10-CM | POA: Diagnosis not present

## 2022-01-01 DIAGNOSIS — N2581 Secondary hyperparathyroidism of renal origin: Secondary | ICD-10-CM | POA: Diagnosis not present

## 2022-01-01 DIAGNOSIS — D689 Coagulation defect, unspecified: Secondary | ICD-10-CM | POA: Diagnosis not present

## 2022-01-03 DIAGNOSIS — N186 End stage renal disease: Secondary | ICD-10-CM | POA: Diagnosis not present

## 2022-01-03 DIAGNOSIS — D689 Coagulation defect, unspecified: Secondary | ICD-10-CM | POA: Diagnosis not present

## 2022-01-03 DIAGNOSIS — D509 Iron deficiency anemia, unspecified: Secondary | ICD-10-CM | POA: Diagnosis not present

## 2022-01-03 DIAGNOSIS — Z992 Dependence on renal dialysis: Secondary | ICD-10-CM | POA: Diagnosis not present

## 2022-01-03 DIAGNOSIS — N2581 Secondary hyperparathyroidism of renal origin: Secondary | ICD-10-CM | POA: Diagnosis not present

## 2022-01-05 DIAGNOSIS — Z992 Dependence on renal dialysis: Secondary | ICD-10-CM | POA: Diagnosis not present

## 2022-01-05 DIAGNOSIS — N186 End stage renal disease: Secondary | ICD-10-CM | POA: Diagnosis not present

## 2022-01-05 DIAGNOSIS — D689 Coagulation defect, unspecified: Secondary | ICD-10-CM | POA: Diagnosis not present

## 2022-01-05 DIAGNOSIS — D509 Iron deficiency anemia, unspecified: Secondary | ICD-10-CM | POA: Diagnosis not present

## 2022-01-05 DIAGNOSIS — N2581 Secondary hyperparathyroidism of renal origin: Secondary | ICD-10-CM | POA: Diagnosis not present

## 2022-01-08 DIAGNOSIS — D509 Iron deficiency anemia, unspecified: Secondary | ICD-10-CM | POA: Diagnosis not present

## 2022-01-08 DIAGNOSIS — Z992 Dependence on renal dialysis: Secondary | ICD-10-CM | POA: Diagnosis not present

## 2022-01-08 DIAGNOSIS — D689 Coagulation defect, unspecified: Secondary | ICD-10-CM | POA: Diagnosis not present

## 2022-01-08 DIAGNOSIS — N2581 Secondary hyperparathyroidism of renal origin: Secondary | ICD-10-CM | POA: Diagnosis not present

## 2022-01-08 DIAGNOSIS — N186 End stage renal disease: Secondary | ICD-10-CM | POA: Diagnosis not present

## 2022-01-10 DIAGNOSIS — D689 Coagulation defect, unspecified: Secondary | ICD-10-CM | POA: Diagnosis not present

## 2022-01-10 DIAGNOSIS — N2581 Secondary hyperparathyroidism of renal origin: Secondary | ICD-10-CM | POA: Diagnosis not present

## 2022-01-10 DIAGNOSIS — N186 End stage renal disease: Secondary | ICD-10-CM | POA: Diagnosis not present

## 2022-01-10 DIAGNOSIS — Z992 Dependence on renal dialysis: Secondary | ICD-10-CM | POA: Diagnosis not present

## 2022-01-10 DIAGNOSIS — D509 Iron deficiency anemia, unspecified: Secondary | ICD-10-CM | POA: Diagnosis not present

## 2022-01-12 DIAGNOSIS — D689 Coagulation defect, unspecified: Secondary | ICD-10-CM | POA: Diagnosis not present

## 2022-01-12 DIAGNOSIS — N2581 Secondary hyperparathyroidism of renal origin: Secondary | ICD-10-CM | POA: Diagnosis not present

## 2022-01-12 DIAGNOSIS — Z992 Dependence on renal dialysis: Secondary | ICD-10-CM | POA: Diagnosis not present

## 2022-01-12 DIAGNOSIS — D509 Iron deficiency anemia, unspecified: Secondary | ICD-10-CM | POA: Diagnosis not present

## 2022-01-12 DIAGNOSIS — N186 End stage renal disease: Secondary | ICD-10-CM | POA: Diagnosis not present

## 2022-01-16 DIAGNOSIS — N186 End stage renal disease: Secondary | ICD-10-CM | POA: Diagnosis not present

## 2022-01-16 DIAGNOSIS — Z992 Dependence on renal dialysis: Secondary | ICD-10-CM | POA: Diagnosis not present

## 2022-01-16 DIAGNOSIS — I120 Hypertensive chronic kidney disease with stage 5 chronic kidney disease or end stage renal disease: Secondary | ICD-10-CM | POA: Diagnosis not present

## 2022-01-17 DIAGNOSIS — Z992 Dependence on renal dialysis: Secondary | ICD-10-CM | POA: Diagnosis not present

## 2022-01-17 DIAGNOSIS — N186 End stage renal disease: Secondary | ICD-10-CM | POA: Diagnosis not present

## 2022-01-17 DIAGNOSIS — N2581 Secondary hyperparathyroidism of renal origin: Secondary | ICD-10-CM | POA: Diagnosis not present

## 2022-01-19 DIAGNOSIS — Z992 Dependence on renal dialysis: Secondary | ICD-10-CM | POA: Diagnosis not present

## 2022-01-19 DIAGNOSIS — N2581 Secondary hyperparathyroidism of renal origin: Secondary | ICD-10-CM | POA: Diagnosis not present

## 2022-01-19 DIAGNOSIS — N186 End stage renal disease: Secondary | ICD-10-CM | POA: Diagnosis not present

## 2022-01-22 DIAGNOSIS — N2581 Secondary hyperparathyroidism of renal origin: Secondary | ICD-10-CM | POA: Diagnosis not present

## 2022-01-22 DIAGNOSIS — Z992 Dependence on renal dialysis: Secondary | ICD-10-CM | POA: Diagnosis not present

## 2022-01-22 DIAGNOSIS — N186 End stage renal disease: Secondary | ICD-10-CM | POA: Diagnosis not present

## 2022-01-23 DIAGNOSIS — G5601 Carpal tunnel syndrome, right upper limb: Secondary | ICD-10-CM | POA: Diagnosis not present

## 2022-01-23 DIAGNOSIS — M65321 Trigger finger, right index finger: Secondary | ICD-10-CM | POA: Diagnosis not present

## 2022-01-23 DIAGNOSIS — M65332 Trigger finger, left middle finger: Secondary | ICD-10-CM | POA: Diagnosis not present

## 2022-01-24 DIAGNOSIS — Z992 Dependence on renal dialysis: Secondary | ICD-10-CM | POA: Diagnosis not present

## 2022-01-24 DIAGNOSIS — N186 End stage renal disease: Secondary | ICD-10-CM | POA: Diagnosis not present

## 2022-01-24 DIAGNOSIS — N2581 Secondary hyperparathyroidism of renal origin: Secondary | ICD-10-CM | POA: Diagnosis not present

## 2022-01-26 DIAGNOSIS — N2581 Secondary hyperparathyroidism of renal origin: Secondary | ICD-10-CM | POA: Diagnosis not present

## 2022-01-26 DIAGNOSIS — N186 End stage renal disease: Secondary | ICD-10-CM | POA: Diagnosis not present

## 2022-01-26 DIAGNOSIS — Z992 Dependence on renal dialysis: Secondary | ICD-10-CM | POA: Diagnosis not present

## 2022-01-29 DIAGNOSIS — N2581 Secondary hyperparathyroidism of renal origin: Secondary | ICD-10-CM | POA: Diagnosis not present

## 2022-01-29 DIAGNOSIS — Z992 Dependence on renal dialysis: Secondary | ICD-10-CM | POA: Diagnosis not present

## 2022-01-29 DIAGNOSIS — N186 End stage renal disease: Secondary | ICD-10-CM | POA: Diagnosis not present

## 2022-01-31 NOTE — Telephone Encounter (Signed)
This encounter was created in error - please disregard.

## 2022-02-02 DIAGNOSIS — N2581 Secondary hyperparathyroidism of renal origin: Secondary | ICD-10-CM | POA: Diagnosis not present

## 2022-02-02 DIAGNOSIS — Z992 Dependence on renal dialysis: Secondary | ICD-10-CM | POA: Diagnosis not present

## 2022-02-02 DIAGNOSIS — N186 End stage renal disease: Secondary | ICD-10-CM | POA: Diagnosis not present

## 2022-02-05 DIAGNOSIS — N2581 Secondary hyperparathyroidism of renal origin: Secondary | ICD-10-CM | POA: Diagnosis not present

## 2022-02-05 DIAGNOSIS — Z992 Dependence on renal dialysis: Secondary | ICD-10-CM | POA: Diagnosis not present

## 2022-02-05 DIAGNOSIS — N186 End stage renal disease: Secondary | ICD-10-CM | POA: Diagnosis not present

## 2022-02-07 DIAGNOSIS — N186 End stage renal disease: Secondary | ICD-10-CM | POA: Diagnosis not present

## 2022-02-07 DIAGNOSIS — Z992 Dependence on renal dialysis: Secondary | ICD-10-CM | POA: Diagnosis not present

## 2022-02-07 DIAGNOSIS — N2581 Secondary hyperparathyroidism of renal origin: Secondary | ICD-10-CM | POA: Diagnosis not present

## 2022-02-09 DIAGNOSIS — N186 End stage renal disease: Secondary | ICD-10-CM | POA: Diagnosis not present

## 2022-02-09 DIAGNOSIS — N2581 Secondary hyperparathyroidism of renal origin: Secondary | ICD-10-CM | POA: Diagnosis not present

## 2022-02-09 DIAGNOSIS — Z992 Dependence on renal dialysis: Secondary | ICD-10-CM | POA: Diagnosis not present

## 2022-02-12 DIAGNOSIS — Z992 Dependence on renal dialysis: Secondary | ICD-10-CM | POA: Diagnosis not present

## 2022-02-12 DIAGNOSIS — N186 End stage renal disease: Secondary | ICD-10-CM | POA: Diagnosis not present

## 2022-02-12 DIAGNOSIS — N2581 Secondary hyperparathyroidism of renal origin: Secondary | ICD-10-CM | POA: Diagnosis not present

## 2022-02-13 DIAGNOSIS — I120 Hypertensive chronic kidney disease with stage 5 chronic kidney disease or end stage renal disease: Secondary | ICD-10-CM | POA: Diagnosis not present

## 2022-02-13 DIAGNOSIS — N186 End stage renal disease: Secondary | ICD-10-CM | POA: Diagnosis not present

## 2022-02-13 DIAGNOSIS — Z992 Dependence on renal dialysis: Secondary | ICD-10-CM | POA: Diagnosis not present

## 2022-02-14 DIAGNOSIS — Z992 Dependence on renal dialysis: Secondary | ICD-10-CM | POA: Diagnosis not present

## 2022-02-14 DIAGNOSIS — N186 End stage renal disease: Secondary | ICD-10-CM | POA: Diagnosis not present

## 2022-02-14 DIAGNOSIS — D631 Anemia in chronic kidney disease: Secondary | ICD-10-CM | POA: Diagnosis not present

## 2022-02-14 DIAGNOSIS — N2581 Secondary hyperparathyroidism of renal origin: Secondary | ICD-10-CM | POA: Diagnosis not present

## 2022-02-16 DIAGNOSIS — N186 End stage renal disease: Secondary | ICD-10-CM | POA: Diagnosis not present

## 2022-02-16 DIAGNOSIS — N2581 Secondary hyperparathyroidism of renal origin: Secondary | ICD-10-CM | POA: Diagnosis not present

## 2022-02-16 DIAGNOSIS — Z992 Dependence on renal dialysis: Secondary | ICD-10-CM | POA: Diagnosis not present

## 2022-02-16 DIAGNOSIS — D631 Anemia in chronic kidney disease: Secondary | ICD-10-CM | POA: Diagnosis not present

## 2022-02-19 DIAGNOSIS — N186 End stage renal disease: Secondary | ICD-10-CM | POA: Diagnosis not present

## 2022-02-19 DIAGNOSIS — N2581 Secondary hyperparathyroidism of renal origin: Secondary | ICD-10-CM | POA: Diagnosis not present

## 2022-02-19 DIAGNOSIS — Z992 Dependence on renal dialysis: Secondary | ICD-10-CM | POA: Diagnosis not present

## 2022-02-19 DIAGNOSIS — D631 Anemia in chronic kidney disease: Secondary | ICD-10-CM | POA: Diagnosis not present

## 2022-02-20 ENCOUNTER — Encounter: Payer: Self-pay | Admitting: Podiatry

## 2022-02-20 ENCOUNTER — Ambulatory Visit (INDEPENDENT_AMBULATORY_CARE_PROVIDER_SITE_OTHER): Payer: Medicare Other | Admitting: Podiatry

## 2022-02-20 ENCOUNTER — Other Ambulatory Visit: Payer: Self-pay

## 2022-02-20 DIAGNOSIS — B351 Tinea unguium: Secondary | ICD-10-CM | POA: Diagnosis not present

## 2022-02-20 DIAGNOSIS — M79675 Pain in left toe(s): Secondary | ICD-10-CM | POA: Diagnosis not present

## 2022-02-20 DIAGNOSIS — I739 Peripheral vascular disease, unspecified: Secondary | ICD-10-CM

## 2022-02-20 DIAGNOSIS — D689 Coagulation defect, unspecified: Secondary | ICD-10-CM

## 2022-02-20 DIAGNOSIS — Z992 Dependence on renal dialysis: Secondary | ICD-10-CM

## 2022-02-20 DIAGNOSIS — I999 Unspecified disorder of circulatory system: Secondary | ICD-10-CM

## 2022-02-20 DIAGNOSIS — M79674 Pain in right toe(s): Secondary | ICD-10-CM

## 2022-02-20 DIAGNOSIS — N186 End stage renal disease: Secondary | ICD-10-CM

## 2022-02-20 NOTE — Progress Notes (Signed)
This patient returns to my office for at risk foot care.  This patient requires this care by a professional since this patient will be at risk due to having ESRD, PAD,coagulation defect and amputation fifth toe right foot.  This patient is unable to cut nails himself since the patient cannot reach his nails.These nails are painful walking and wearing shoes.  This patient presents for at risk foot care today.  General Appearance  Alert, conversant and in no acute stress.  Vascular  Dorsalis pedis and posterior tibial  pulses are weakly palpable  bilaterally.  Capillary return is within normal limits  bilaterally. Cold feet  bilaterally. Absent digital hair  B/L.  Neurologic  Senn-Weinstein monofilament wire test within normal limits  bilaterally. Muscle power within normal limits bilaterally.  Nails Thick disfigured discolored nails with subungual debris  from hallux to fifth toes left and 1-4 right foot. No evidence of bacterial infection or drainage bilaterally.  Orthopedic  No limitations of motion  feet .  No crepitus or effusions noted.  No bony pathology or digital deformities noted.  Skin  normotropic skin with no porokeratosis noted bilaterally.  No signs of infections or ulcers noted.     Onychomycosis  Pain in right toes  Pain in left toes  Consent was obtained for treatment procedures.   Mechanical debridement of nails 1-5  left foot and 1-4 right foot. performed with a nail nipper.  Filed with dremel without incident.    Return office visit   3 months                   Told patient to return for periodic foot care and evaluation due to potential at risk complications.   Jerika Wales DPM   

## 2022-02-21 DIAGNOSIS — N186 End stage renal disease: Secondary | ICD-10-CM | POA: Diagnosis not present

## 2022-02-21 DIAGNOSIS — Z992 Dependence on renal dialysis: Secondary | ICD-10-CM | POA: Diagnosis not present

## 2022-02-21 DIAGNOSIS — D631 Anemia in chronic kidney disease: Secondary | ICD-10-CM | POA: Diagnosis not present

## 2022-02-21 DIAGNOSIS — N2581 Secondary hyperparathyroidism of renal origin: Secondary | ICD-10-CM | POA: Diagnosis not present

## 2022-02-23 DIAGNOSIS — Z992 Dependence on renal dialysis: Secondary | ICD-10-CM | POA: Diagnosis not present

## 2022-02-23 DIAGNOSIS — D631 Anemia in chronic kidney disease: Secondary | ICD-10-CM | POA: Diagnosis not present

## 2022-02-23 DIAGNOSIS — N186 End stage renal disease: Secondary | ICD-10-CM | POA: Diagnosis not present

## 2022-02-23 DIAGNOSIS — N2581 Secondary hyperparathyroidism of renal origin: Secondary | ICD-10-CM | POA: Diagnosis not present

## 2022-02-28 DIAGNOSIS — N2581 Secondary hyperparathyroidism of renal origin: Secondary | ICD-10-CM | POA: Diagnosis not present

## 2022-02-28 DIAGNOSIS — N186 End stage renal disease: Secondary | ICD-10-CM | POA: Diagnosis not present

## 2022-02-28 DIAGNOSIS — Z992 Dependence on renal dialysis: Secondary | ICD-10-CM | POA: Diagnosis not present

## 2022-02-28 DIAGNOSIS — D631 Anemia in chronic kidney disease: Secondary | ICD-10-CM | POA: Diagnosis not present

## 2022-03-02 DIAGNOSIS — Z992 Dependence on renal dialysis: Secondary | ICD-10-CM | POA: Diagnosis not present

## 2022-03-02 DIAGNOSIS — N2581 Secondary hyperparathyroidism of renal origin: Secondary | ICD-10-CM | POA: Diagnosis not present

## 2022-03-02 DIAGNOSIS — D631 Anemia in chronic kidney disease: Secondary | ICD-10-CM | POA: Diagnosis not present

## 2022-03-02 DIAGNOSIS — N186 End stage renal disease: Secondary | ICD-10-CM | POA: Diagnosis not present

## 2022-03-05 DIAGNOSIS — N186 End stage renal disease: Secondary | ICD-10-CM | POA: Diagnosis not present

## 2022-03-05 DIAGNOSIS — N2581 Secondary hyperparathyroidism of renal origin: Secondary | ICD-10-CM | POA: Diagnosis not present

## 2022-03-05 DIAGNOSIS — D631 Anemia in chronic kidney disease: Secondary | ICD-10-CM | POA: Diagnosis not present

## 2022-03-05 DIAGNOSIS — Z992 Dependence on renal dialysis: Secondary | ICD-10-CM | POA: Diagnosis not present

## 2022-03-07 DIAGNOSIS — D631 Anemia in chronic kidney disease: Secondary | ICD-10-CM | POA: Diagnosis not present

## 2022-03-07 DIAGNOSIS — N186 End stage renal disease: Secondary | ICD-10-CM | POA: Diagnosis not present

## 2022-03-07 DIAGNOSIS — Z992 Dependence on renal dialysis: Secondary | ICD-10-CM | POA: Diagnosis not present

## 2022-03-07 DIAGNOSIS — N2581 Secondary hyperparathyroidism of renal origin: Secondary | ICD-10-CM | POA: Diagnosis not present

## 2022-03-09 DIAGNOSIS — D631 Anemia in chronic kidney disease: Secondary | ICD-10-CM | POA: Diagnosis not present

## 2022-03-09 DIAGNOSIS — N186 End stage renal disease: Secondary | ICD-10-CM | POA: Diagnosis not present

## 2022-03-09 DIAGNOSIS — N2581 Secondary hyperparathyroidism of renal origin: Secondary | ICD-10-CM | POA: Diagnosis not present

## 2022-03-09 DIAGNOSIS — Z992 Dependence on renal dialysis: Secondary | ICD-10-CM | POA: Diagnosis not present

## 2022-03-12 DIAGNOSIS — H401133 Primary open-angle glaucoma, bilateral, severe stage: Secondary | ICD-10-CM | POA: Diagnosis not present

## 2022-03-12 DIAGNOSIS — H2512 Age-related nuclear cataract, left eye: Secondary | ICD-10-CM | POA: Diagnosis not present

## 2022-03-12 DIAGNOSIS — N186 End stage renal disease: Secondary | ICD-10-CM | POA: Diagnosis not present

## 2022-03-12 DIAGNOSIS — Z992 Dependence on renal dialysis: Secondary | ICD-10-CM | POA: Diagnosis not present

## 2022-03-12 DIAGNOSIS — Z961 Presence of intraocular lens: Secondary | ICD-10-CM | POA: Diagnosis not present

## 2022-03-12 DIAGNOSIS — D631 Anemia in chronic kidney disease: Secondary | ICD-10-CM | POA: Diagnosis not present

## 2022-03-12 DIAGNOSIS — N2581 Secondary hyperparathyroidism of renal origin: Secondary | ICD-10-CM | POA: Diagnosis not present

## 2022-03-14 DIAGNOSIS — Z992 Dependence on renal dialysis: Secondary | ICD-10-CM | POA: Diagnosis not present

## 2022-03-14 DIAGNOSIS — D631 Anemia in chronic kidney disease: Secondary | ICD-10-CM | POA: Diagnosis not present

## 2022-03-14 DIAGNOSIS — N186 End stage renal disease: Secondary | ICD-10-CM | POA: Diagnosis not present

## 2022-03-14 DIAGNOSIS — N2581 Secondary hyperparathyroidism of renal origin: Secondary | ICD-10-CM | POA: Diagnosis not present

## 2022-03-16 DIAGNOSIS — N2581 Secondary hyperparathyroidism of renal origin: Secondary | ICD-10-CM | POA: Diagnosis not present

## 2022-03-16 DIAGNOSIS — D631 Anemia in chronic kidney disease: Secondary | ICD-10-CM | POA: Diagnosis not present

## 2022-03-16 DIAGNOSIS — N186 End stage renal disease: Secondary | ICD-10-CM | POA: Diagnosis not present

## 2022-03-16 DIAGNOSIS — I120 Hypertensive chronic kidney disease with stage 5 chronic kidney disease or end stage renal disease: Secondary | ICD-10-CM | POA: Diagnosis not present

## 2022-03-16 DIAGNOSIS — Z992 Dependence on renal dialysis: Secondary | ICD-10-CM | POA: Diagnosis not present

## 2022-03-19 DIAGNOSIS — N186 End stage renal disease: Secondary | ICD-10-CM | POA: Diagnosis not present

## 2022-03-19 DIAGNOSIS — Z992 Dependence on renal dialysis: Secondary | ICD-10-CM | POA: Diagnosis not present

## 2022-03-19 DIAGNOSIS — D631 Anemia in chronic kidney disease: Secondary | ICD-10-CM | POA: Diagnosis not present

## 2022-03-19 DIAGNOSIS — N2581 Secondary hyperparathyroidism of renal origin: Secondary | ICD-10-CM | POA: Diagnosis not present

## 2022-03-23 DIAGNOSIS — D631 Anemia in chronic kidney disease: Secondary | ICD-10-CM | POA: Diagnosis not present

## 2022-03-23 DIAGNOSIS — N2581 Secondary hyperparathyroidism of renal origin: Secondary | ICD-10-CM | POA: Diagnosis not present

## 2022-03-23 DIAGNOSIS — Z992 Dependence on renal dialysis: Secondary | ICD-10-CM | POA: Diagnosis not present

## 2022-03-23 DIAGNOSIS — N186 End stage renal disease: Secondary | ICD-10-CM | POA: Diagnosis not present

## 2022-03-26 DIAGNOSIS — N186 End stage renal disease: Secondary | ICD-10-CM | POA: Diagnosis not present

## 2022-03-26 DIAGNOSIS — Z992 Dependence on renal dialysis: Secondary | ICD-10-CM | POA: Diagnosis not present

## 2022-03-26 DIAGNOSIS — N2581 Secondary hyperparathyroidism of renal origin: Secondary | ICD-10-CM | POA: Diagnosis not present

## 2022-03-26 DIAGNOSIS — D631 Anemia in chronic kidney disease: Secondary | ICD-10-CM | POA: Diagnosis not present

## 2022-03-28 DIAGNOSIS — Z992 Dependence on renal dialysis: Secondary | ICD-10-CM | POA: Diagnosis not present

## 2022-03-28 DIAGNOSIS — N186 End stage renal disease: Secondary | ICD-10-CM | POA: Diagnosis not present

## 2022-03-28 DIAGNOSIS — N2581 Secondary hyperparathyroidism of renal origin: Secondary | ICD-10-CM | POA: Diagnosis not present

## 2022-03-28 DIAGNOSIS — D631 Anemia in chronic kidney disease: Secondary | ICD-10-CM | POA: Diagnosis not present

## 2022-03-30 DIAGNOSIS — N2581 Secondary hyperparathyroidism of renal origin: Secondary | ICD-10-CM | POA: Diagnosis not present

## 2022-03-30 DIAGNOSIS — N186 End stage renal disease: Secondary | ICD-10-CM | POA: Diagnosis not present

## 2022-03-30 DIAGNOSIS — Z992 Dependence on renal dialysis: Secondary | ICD-10-CM | POA: Diagnosis not present

## 2022-03-30 DIAGNOSIS — D631 Anemia in chronic kidney disease: Secondary | ICD-10-CM | POA: Diagnosis not present

## 2022-04-04 DIAGNOSIS — N2581 Secondary hyperparathyroidism of renal origin: Secondary | ICD-10-CM | POA: Diagnosis not present

## 2022-04-04 DIAGNOSIS — D631 Anemia in chronic kidney disease: Secondary | ICD-10-CM | POA: Diagnosis not present

## 2022-04-04 DIAGNOSIS — N186 End stage renal disease: Secondary | ICD-10-CM | POA: Diagnosis not present

## 2022-04-04 DIAGNOSIS — Z992 Dependence on renal dialysis: Secondary | ICD-10-CM | POA: Diagnosis not present

## 2022-04-05 DIAGNOSIS — H903 Sensorineural hearing loss, bilateral: Secondary | ICD-10-CM | POA: Insufficient documentation

## 2022-04-06 DIAGNOSIS — D631 Anemia in chronic kidney disease: Secondary | ICD-10-CM | POA: Diagnosis not present

## 2022-04-06 DIAGNOSIS — Z992 Dependence on renal dialysis: Secondary | ICD-10-CM | POA: Diagnosis not present

## 2022-04-06 DIAGNOSIS — N186 End stage renal disease: Secondary | ICD-10-CM | POA: Diagnosis not present

## 2022-04-06 DIAGNOSIS — N2581 Secondary hyperparathyroidism of renal origin: Secondary | ICD-10-CM | POA: Diagnosis not present

## 2022-04-09 DIAGNOSIS — N186 End stage renal disease: Secondary | ICD-10-CM | POA: Diagnosis not present

## 2022-04-09 DIAGNOSIS — D631 Anemia in chronic kidney disease: Secondary | ICD-10-CM | POA: Diagnosis not present

## 2022-04-09 DIAGNOSIS — N2581 Secondary hyperparathyroidism of renal origin: Secondary | ICD-10-CM | POA: Diagnosis not present

## 2022-04-09 DIAGNOSIS — Z992 Dependence on renal dialysis: Secondary | ICD-10-CM | POA: Diagnosis not present

## 2022-04-13 DIAGNOSIS — N2581 Secondary hyperparathyroidism of renal origin: Secondary | ICD-10-CM | POA: Diagnosis not present

## 2022-04-13 DIAGNOSIS — D631 Anemia in chronic kidney disease: Secondary | ICD-10-CM | POA: Diagnosis not present

## 2022-04-13 DIAGNOSIS — Z992 Dependence on renal dialysis: Secondary | ICD-10-CM | POA: Diagnosis not present

## 2022-04-13 DIAGNOSIS — N186 End stage renal disease: Secondary | ICD-10-CM | POA: Diagnosis not present

## 2022-04-15 DIAGNOSIS — I120 Hypertensive chronic kidney disease with stage 5 chronic kidney disease or end stage renal disease: Secondary | ICD-10-CM | POA: Diagnosis not present

## 2022-04-15 DIAGNOSIS — N186 End stage renal disease: Secondary | ICD-10-CM | POA: Diagnosis not present

## 2022-04-15 DIAGNOSIS — Z992 Dependence on renal dialysis: Secondary | ICD-10-CM | POA: Diagnosis not present

## 2022-04-16 DIAGNOSIS — N186 End stage renal disease: Secondary | ICD-10-CM | POA: Diagnosis not present

## 2022-04-16 DIAGNOSIS — Z20822 Contact with and (suspected) exposure to covid-19: Secondary | ICD-10-CM | POA: Diagnosis not present

## 2022-04-16 DIAGNOSIS — N2581 Secondary hyperparathyroidism of renal origin: Secondary | ICD-10-CM | POA: Diagnosis not present

## 2022-04-16 DIAGNOSIS — D631 Anemia in chronic kidney disease: Secondary | ICD-10-CM | POA: Diagnosis not present

## 2022-04-16 DIAGNOSIS — E875 Hyperkalemia: Secondary | ICD-10-CM | POA: Diagnosis not present

## 2022-04-16 DIAGNOSIS — Z992 Dependence on renal dialysis: Secondary | ICD-10-CM | POA: Diagnosis not present

## 2022-04-18 DIAGNOSIS — Z992 Dependence on renal dialysis: Secondary | ICD-10-CM | POA: Diagnosis not present

## 2022-04-18 DIAGNOSIS — N2581 Secondary hyperparathyroidism of renal origin: Secondary | ICD-10-CM | POA: Diagnosis not present

## 2022-04-18 DIAGNOSIS — N186 End stage renal disease: Secondary | ICD-10-CM | POA: Diagnosis not present

## 2022-04-18 DIAGNOSIS — E875 Hyperkalemia: Secondary | ICD-10-CM | POA: Diagnosis not present

## 2022-04-18 DIAGNOSIS — D631 Anemia in chronic kidney disease: Secondary | ICD-10-CM | POA: Diagnosis not present

## 2022-04-19 DIAGNOSIS — Z20822 Contact with and (suspected) exposure to covid-19: Secondary | ICD-10-CM | POA: Diagnosis not present

## 2022-04-20 DIAGNOSIS — Z992 Dependence on renal dialysis: Secondary | ICD-10-CM | POA: Diagnosis not present

## 2022-04-20 DIAGNOSIS — N2581 Secondary hyperparathyroidism of renal origin: Secondary | ICD-10-CM | POA: Diagnosis not present

## 2022-04-20 DIAGNOSIS — D631 Anemia in chronic kidney disease: Secondary | ICD-10-CM | POA: Diagnosis not present

## 2022-04-20 DIAGNOSIS — N186 End stage renal disease: Secondary | ICD-10-CM | POA: Diagnosis not present

## 2022-04-20 DIAGNOSIS — E875 Hyperkalemia: Secondary | ICD-10-CM | POA: Diagnosis not present

## 2022-04-25 DIAGNOSIS — D631 Anemia in chronic kidney disease: Secondary | ICD-10-CM | POA: Diagnosis not present

## 2022-04-25 DIAGNOSIS — E875 Hyperkalemia: Secondary | ICD-10-CM | POA: Diagnosis not present

## 2022-04-25 DIAGNOSIS — Z992 Dependence on renal dialysis: Secondary | ICD-10-CM | POA: Diagnosis not present

## 2022-04-25 DIAGNOSIS — N2581 Secondary hyperparathyroidism of renal origin: Secondary | ICD-10-CM | POA: Diagnosis not present

## 2022-04-25 DIAGNOSIS — N186 End stage renal disease: Secondary | ICD-10-CM | POA: Diagnosis not present

## 2022-04-27 DIAGNOSIS — Z992 Dependence on renal dialysis: Secondary | ICD-10-CM | POA: Diagnosis not present

## 2022-04-27 DIAGNOSIS — D631 Anemia in chronic kidney disease: Secondary | ICD-10-CM | POA: Diagnosis not present

## 2022-04-27 DIAGNOSIS — N186 End stage renal disease: Secondary | ICD-10-CM | POA: Diagnosis not present

## 2022-04-27 DIAGNOSIS — N2581 Secondary hyperparathyroidism of renal origin: Secondary | ICD-10-CM | POA: Diagnosis not present

## 2022-04-27 DIAGNOSIS — E875 Hyperkalemia: Secondary | ICD-10-CM | POA: Diagnosis not present

## 2022-04-30 DIAGNOSIS — N186 End stage renal disease: Secondary | ICD-10-CM | POA: Diagnosis not present

## 2022-04-30 DIAGNOSIS — D631 Anemia in chronic kidney disease: Secondary | ICD-10-CM | POA: Diagnosis not present

## 2022-04-30 DIAGNOSIS — E875 Hyperkalemia: Secondary | ICD-10-CM | POA: Diagnosis not present

## 2022-04-30 DIAGNOSIS — N2581 Secondary hyperparathyroidism of renal origin: Secondary | ICD-10-CM | POA: Diagnosis not present

## 2022-04-30 DIAGNOSIS — Z992 Dependence on renal dialysis: Secondary | ICD-10-CM | POA: Diagnosis not present

## 2022-05-02 DIAGNOSIS — Z992 Dependence on renal dialysis: Secondary | ICD-10-CM | POA: Diagnosis not present

## 2022-05-02 DIAGNOSIS — D631 Anemia in chronic kidney disease: Secondary | ICD-10-CM | POA: Diagnosis not present

## 2022-05-02 DIAGNOSIS — N2581 Secondary hyperparathyroidism of renal origin: Secondary | ICD-10-CM | POA: Diagnosis not present

## 2022-05-02 DIAGNOSIS — E875 Hyperkalemia: Secondary | ICD-10-CM | POA: Diagnosis not present

## 2022-05-02 DIAGNOSIS — N186 End stage renal disease: Secondary | ICD-10-CM | POA: Diagnosis not present

## 2022-05-04 DIAGNOSIS — N186 End stage renal disease: Secondary | ICD-10-CM | POA: Diagnosis not present

## 2022-05-04 DIAGNOSIS — E875 Hyperkalemia: Secondary | ICD-10-CM | POA: Diagnosis not present

## 2022-05-04 DIAGNOSIS — D631 Anemia in chronic kidney disease: Secondary | ICD-10-CM | POA: Diagnosis not present

## 2022-05-04 DIAGNOSIS — N2581 Secondary hyperparathyroidism of renal origin: Secondary | ICD-10-CM | POA: Diagnosis not present

## 2022-05-04 DIAGNOSIS — Z992 Dependence on renal dialysis: Secondary | ICD-10-CM | POA: Diagnosis not present

## 2022-05-07 DIAGNOSIS — Z992 Dependence on renal dialysis: Secondary | ICD-10-CM | POA: Diagnosis not present

## 2022-05-07 DIAGNOSIS — D631 Anemia in chronic kidney disease: Secondary | ICD-10-CM | POA: Diagnosis not present

## 2022-05-07 DIAGNOSIS — N2581 Secondary hyperparathyroidism of renal origin: Secondary | ICD-10-CM | POA: Diagnosis not present

## 2022-05-07 DIAGNOSIS — N186 End stage renal disease: Secondary | ICD-10-CM | POA: Diagnosis not present

## 2022-05-07 DIAGNOSIS — E875 Hyperkalemia: Secondary | ICD-10-CM | POA: Diagnosis not present

## 2022-05-09 DIAGNOSIS — N2581 Secondary hyperparathyroidism of renal origin: Secondary | ICD-10-CM | POA: Diagnosis not present

## 2022-05-09 DIAGNOSIS — D631 Anemia in chronic kidney disease: Secondary | ICD-10-CM | POA: Diagnosis not present

## 2022-05-09 DIAGNOSIS — E875 Hyperkalemia: Secondary | ICD-10-CM | POA: Diagnosis not present

## 2022-05-09 DIAGNOSIS — N186 End stage renal disease: Secondary | ICD-10-CM | POA: Diagnosis not present

## 2022-05-09 DIAGNOSIS — Z992 Dependence on renal dialysis: Secondary | ICD-10-CM | POA: Diagnosis not present

## 2022-05-11 DIAGNOSIS — D631 Anemia in chronic kidney disease: Secondary | ICD-10-CM | POA: Diagnosis not present

## 2022-05-11 DIAGNOSIS — N2581 Secondary hyperparathyroidism of renal origin: Secondary | ICD-10-CM | POA: Diagnosis not present

## 2022-05-11 DIAGNOSIS — E875 Hyperkalemia: Secondary | ICD-10-CM | POA: Diagnosis not present

## 2022-05-11 DIAGNOSIS — Z992 Dependence on renal dialysis: Secondary | ICD-10-CM | POA: Diagnosis not present

## 2022-05-11 DIAGNOSIS — N186 End stage renal disease: Secondary | ICD-10-CM | POA: Diagnosis not present

## 2022-05-16 DIAGNOSIS — D631 Anemia in chronic kidney disease: Secondary | ICD-10-CM | POA: Diagnosis not present

## 2022-05-16 DIAGNOSIS — Z992 Dependence on renal dialysis: Secondary | ICD-10-CM | POA: Diagnosis not present

## 2022-05-16 DIAGNOSIS — N186 End stage renal disease: Secondary | ICD-10-CM | POA: Diagnosis not present

## 2022-05-16 DIAGNOSIS — N2581 Secondary hyperparathyroidism of renal origin: Secondary | ICD-10-CM | POA: Diagnosis not present

## 2022-05-16 DIAGNOSIS — I120 Hypertensive chronic kidney disease with stage 5 chronic kidney disease or end stage renal disease: Secondary | ICD-10-CM | POA: Diagnosis not present

## 2022-05-18 DIAGNOSIS — N2581 Secondary hyperparathyroidism of renal origin: Secondary | ICD-10-CM | POA: Diagnosis not present

## 2022-05-18 DIAGNOSIS — N186 End stage renal disease: Secondary | ICD-10-CM | POA: Diagnosis not present

## 2022-05-18 DIAGNOSIS — D631 Anemia in chronic kidney disease: Secondary | ICD-10-CM | POA: Diagnosis not present

## 2022-05-18 DIAGNOSIS — Z992 Dependence on renal dialysis: Secondary | ICD-10-CM | POA: Diagnosis not present

## 2022-05-21 DIAGNOSIS — N2581 Secondary hyperparathyroidism of renal origin: Secondary | ICD-10-CM | POA: Diagnosis not present

## 2022-05-21 DIAGNOSIS — N186 End stage renal disease: Secondary | ICD-10-CM | POA: Diagnosis not present

## 2022-05-21 DIAGNOSIS — D631 Anemia in chronic kidney disease: Secondary | ICD-10-CM | POA: Diagnosis not present

## 2022-05-21 DIAGNOSIS — Z992 Dependence on renal dialysis: Secondary | ICD-10-CM | POA: Diagnosis not present

## 2022-05-23 DIAGNOSIS — N2581 Secondary hyperparathyroidism of renal origin: Secondary | ICD-10-CM | POA: Diagnosis not present

## 2022-05-23 DIAGNOSIS — N186 End stage renal disease: Secondary | ICD-10-CM | POA: Diagnosis not present

## 2022-05-23 DIAGNOSIS — Z992 Dependence on renal dialysis: Secondary | ICD-10-CM | POA: Diagnosis not present

## 2022-05-23 DIAGNOSIS — D631 Anemia in chronic kidney disease: Secondary | ICD-10-CM | POA: Diagnosis not present

## 2022-05-24 ENCOUNTER — Encounter: Payer: Self-pay | Admitting: Podiatry

## 2022-05-24 ENCOUNTER — Ambulatory Visit (INDEPENDENT_AMBULATORY_CARE_PROVIDER_SITE_OTHER): Payer: Medicare Other | Admitting: Podiatry

## 2022-05-24 DIAGNOSIS — I999 Unspecified disorder of circulatory system: Secondary | ICD-10-CM

## 2022-05-24 DIAGNOSIS — B351 Tinea unguium: Secondary | ICD-10-CM | POA: Diagnosis not present

## 2022-05-24 DIAGNOSIS — M79674 Pain in right toe(s): Secondary | ICD-10-CM | POA: Diagnosis not present

## 2022-05-24 DIAGNOSIS — I739 Peripheral vascular disease, unspecified: Secondary | ICD-10-CM

## 2022-05-24 DIAGNOSIS — N186 End stage renal disease: Secondary | ICD-10-CM

## 2022-05-24 DIAGNOSIS — Z992 Dependence on renal dialysis: Secondary | ICD-10-CM

## 2022-05-24 DIAGNOSIS — M79675 Pain in left toe(s): Secondary | ICD-10-CM

## 2022-05-24 DIAGNOSIS — D689 Coagulation defect, unspecified: Secondary | ICD-10-CM

## 2022-05-24 NOTE — Progress Notes (Signed)
This patient returns to my office for at risk foot care.  This patient requires this care by a professional since this patient will be at risk due to having ESRD, PAD,coagulation defect and amputation fifth toe right foot.  This patient is unable to cut nails himself since the patient cannot reach his nails.These nails are painful walking and wearing shoes.  This patient presents for at risk foot care today.  General Appearance  Alert, conversant and in no acute stress.  Vascular  Dorsalis pedis and posterior tibial  pulses are weakly palpable  bilaterally.  Capillary return is within normal limits  bilaterally. Cold feet  bilaterally. Absent digital hair  B/L.  Neurologic  Senn-Weinstein monofilament wire test within normal limits  bilaterally. Muscle power within normal limits bilaterally.  Nails Thick disfigured discolored nails with subungual debris  from hallux to fifth toes left and 1-4 right foot. No evidence of bacterial infection or drainage bilaterally.  Orthopedic  No limitations of motion  feet .  No crepitus or effusions noted.  No bony pathology or digital deformities noted.  Skin  normotropic skin with no porokeratosis noted bilaterally.  No signs of infections or ulcers noted.     Onychomycosis  Pain in right toes  Pain in left toes  Consent was obtained for treatment procedures.   Mechanical debridement of nails 1-5  left foot and 1-4 right foot. performed with a nail nipper.  Filed with dremel without incident.    Return office visit   3 months                   Told patient to return for periodic foot care and evaluation due to potential at risk complications.   Raheim Beutler DPM   

## 2022-05-25 DIAGNOSIS — N2581 Secondary hyperparathyroidism of renal origin: Secondary | ICD-10-CM | POA: Diagnosis not present

## 2022-05-25 DIAGNOSIS — Z992 Dependence on renal dialysis: Secondary | ICD-10-CM | POA: Diagnosis not present

## 2022-05-25 DIAGNOSIS — D631 Anemia in chronic kidney disease: Secondary | ICD-10-CM | POA: Diagnosis not present

## 2022-05-25 DIAGNOSIS — N186 End stage renal disease: Secondary | ICD-10-CM | POA: Diagnosis not present

## 2022-05-28 DIAGNOSIS — Z992 Dependence on renal dialysis: Secondary | ICD-10-CM | POA: Diagnosis not present

## 2022-05-28 DIAGNOSIS — N2581 Secondary hyperparathyroidism of renal origin: Secondary | ICD-10-CM | POA: Diagnosis not present

## 2022-05-28 DIAGNOSIS — N186 End stage renal disease: Secondary | ICD-10-CM | POA: Diagnosis not present

## 2022-05-28 DIAGNOSIS — D631 Anemia in chronic kidney disease: Secondary | ICD-10-CM | POA: Diagnosis not present

## 2022-05-30 DIAGNOSIS — N2581 Secondary hyperparathyroidism of renal origin: Secondary | ICD-10-CM | POA: Diagnosis not present

## 2022-05-30 DIAGNOSIS — Z992 Dependence on renal dialysis: Secondary | ICD-10-CM | POA: Diagnosis not present

## 2022-05-30 DIAGNOSIS — N186 End stage renal disease: Secondary | ICD-10-CM | POA: Diagnosis not present

## 2022-05-30 DIAGNOSIS — D631 Anemia in chronic kidney disease: Secondary | ICD-10-CM | POA: Diagnosis not present

## 2022-06-01 DIAGNOSIS — N186 End stage renal disease: Secondary | ICD-10-CM | POA: Diagnosis not present

## 2022-06-01 DIAGNOSIS — N2581 Secondary hyperparathyroidism of renal origin: Secondary | ICD-10-CM | POA: Diagnosis not present

## 2022-06-01 DIAGNOSIS — D631 Anemia in chronic kidney disease: Secondary | ICD-10-CM | POA: Diagnosis not present

## 2022-06-01 DIAGNOSIS — Z992 Dependence on renal dialysis: Secondary | ICD-10-CM | POA: Diagnosis not present

## 2022-06-04 DIAGNOSIS — N2581 Secondary hyperparathyroidism of renal origin: Secondary | ICD-10-CM | POA: Diagnosis not present

## 2022-06-04 DIAGNOSIS — Z992 Dependence on renal dialysis: Secondary | ICD-10-CM | POA: Diagnosis not present

## 2022-06-04 DIAGNOSIS — N186 End stage renal disease: Secondary | ICD-10-CM | POA: Diagnosis not present

## 2022-06-04 DIAGNOSIS — D631 Anemia in chronic kidney disease: Secondary | ICD-10-CM | POA: Diagnosis not present

## 2022-06-06 DIAGNOSIS — N186 End stage renal disease: Secondary | ICD-10-CM | POA: Diagnosis not present

## 2022-06-06 DIAGNOSIS — N2581 Secondary hyperparathyroidism of renal origin: Secondary | ICD-10-CM | POA: Diagnosis not present

## 2022-06-06 DIAGNOSIS — Z992 Dependence on renal dialysis: Secondary | ICD-10-CM | POA: Diagnosis not present

## 2022-06-06 DIAGNOSIS — D631 Anemia in chronic kidney disease: Secondary | ICD-10-CM | POA: Diagnosis not present

## 2022-06-08 DIAGNOSIS — N186 End stage renal disease: Secondary | ICD-10-CM | POA: Diagnosis not present

## 2022-06-08 DIAGNOSIS — Z992 Dependence on renal dialysis: Secondary | ICD-10-CM | POA: Diagnosis not present

## 2022-06-08 DIAGNOSIS — N2581 Secondary hyperparathyroidism of renal origin: Secondary | ICD-10-CM | POA: Diagnosis not present

## 2022-06-08 DIAGNOSIS — D631 Anemia in chronic kidney disease: Secondary | ICD-10-CM | POA: Diagnosis not present

## 2022-06-10 DIAGNOSIS — H401133 Primary open-angle glaucoma, bilateral, severe stage: Secondary | ICD-10-CM | POA: Diagnosis not present

## 2022-06-10 DIAGNOSIS — Z961 Presence of intraocular lens: Secondary | ICD-10-CM | POA: Diagnosis not present

## 2022-06-10 DIAGNOSIS — H2512 Age-related nuclear cataract, left eye: Secondary | ICD-10-CM | POA: Diagnosis not present

## 2022-06-11 DIAGNOSIS — N2581 Secondary hyperparathyroidism of renal origin: Secondary | ICD-10-CM | POA: Diagnosis not present

## 2022-06-11 DIAGNOSIS — N186 End stage renal disease: Secondary | ICD-10-CM | POA: Diagnosis not present

## 2022-06-11 DIAGNOSIS — D631 Anemia in chronic kidney disease: Secondary | ICD-10-CM | POA: Diagnosis not present

## 2022-06-11 DIAGNOSIS — Z992 Dependence on renal dialysis: Secondary | ICD-10-CM | POA: Diagnosis not present

## 2022-06-15 DIAGNOSIS — N186 End stage renal disease: Secondary | ICD-10-CM | POA: Diagnosis not present

## 2022-06-15 DIAGNOSIS — N2581 Secondary hyperparathyroidism of renal origin: Secondary | ICD-10-CM | POA: Diagnosis not present

## 2022-06-15 DIAGNOSIS — D631 Anemia in chronic kidney disease: Secondary | ICD-10-CM | POA: Diagnosis not present

## 2022-06-15 DIAGNOSIS — I120 Hypertensive chronic kidney disease with stage 5 chronic kidney disease or end stage renal disease: Secondary | ICD-10-CM | POA: Diagnosis not present

## 2022-06-15 DIAGNOSIS — Z992 Dependence on renal dialysis: Secondary | ICD-10-CM | POA: Diagnosis not present

## 2022-06-18 DIAGNOSIS — Z992 Dependence on renal dialysis: Secondary | ICD-10-CM | POA: Diagnosis not present

## 2022-06-18 DIAGNOSIS — N2581 Secondary hyperparathyroidism of renal origin: Secondary | ICD-10-CM | POA: Diagnosis not present

## 2022-06-18 DIAGNOSIS — N186 End stage renal disease: Secondary | ICD-10-CM | POA: Diagnosis not present

## 2022-06-18 DIAGNOSIS — D631 Anemia in chronic kidney disease: Secondary | ICD-10-CM | POA: Diagnosis not present

## 2022-06-20 DIAGNOSIS — N2581 Secondary hyperparathyroidism of renal origin: Secondary | ICD-10-CM | POA: Diagnosis not present

## 2022-06-20 DIAGNOSIS — D631 Anemia in chronic kidney disease: Secondary | ICD-10-CM | POA: Diagnosis not present

## 2022-06-20 DIAGNOSIS — N186 End stage renal disease: Secondary | ICD-10-CM | POA: Diagnosis not present

## 2022-06-20 DIAGNOSIS — Z992 Dependence on renal dialysis: Secondary | ICD-10-CM | POA: Diagnosis not present

## 2022-06-22 DIAGNOSIS — Z992 Dependence on renal dialysis: Secondary | ICD-10-CM | POA: Diagnosis not present

## 2022-06-22 DIAGNOSIS — D631 Anemia in chronic kidney disease: Secondary | ICD-10-CM | POA: Diagnosis not present

## 2022-06-22 DIAGNOSIS — N186 End stage renal disease: Secondary | ICD-10-CM | POA: Diagnosis not present

## 2022-06-22 DIAGNOSIS — N2581 Secondary hyperparathyroidism of renal origin: Secondary | ICD-10-CM | POA: Diagnosis not present

## 2022-06-25 DIAGNOSIS — D631 Anemia in chronic kidney disease: Secondary | ICD-10-CM | POA: Diagnosis not present

## 2022-06-25 DIAGNOSIS — N2581 Secondary hyperparathyroidism of renal origin: Secondary | ICD-10-CM | POA: Diagnosis not present

## 2022-06-25 DIAGNOSIS — N186 End stage renal disease: Secondary | ICD-10-CM | POA: Diagnosis not present

## 2022-06-25 DIAGNOSIS — Z992 Dependence on renal dialysis: Secondary | ICD-10-CM | POA: Diagnosis not present

## 2022-06-27 DIAGNOSIS — Z992 Dependence on renal dialysis: Secondary | ICD-10-CM | POA: Diagnosis not present

## 2022-06-27 DIAGNOSIS — D631 Anemia in chronic kidney disease: Secondary | ICD-10-CM | POA: Diagnosis not present

## 2022-06-27 DIAGNOSIS — N2581 Secondary hyperparathyroidism of renal origin: Secondary | ICD-10-CM | POA: Diagnosis not present

## 2022-06-27 DIAGNOSIS — N186 End stage renal disease: Secondary | ICD-10-CM | POA: Diagnosis not present

## 2022-06-29 DIAGNOSIS — N2581 Secondary hyperparathyroidism of renal origin: Secondary | ICD-10-CM | POA: Diagnosis not present

## 2022-06-29 DIAGNOSIS — D631 Anemia in chronic kidney disease: Secondary | ICD-10-CM | POA: Diagnosis not present

## 2022-06-29 DIAGNOSIS — N186 End stage renal disease: Secondary | ICD-10-CM | POA: Diagnosis not present

## 2022-06-29 DIAGNOSIS — Z992 Dependence on renal dialysis: Secondary | ICD-10-CM | POA: Diagnosis not present

## 2022-07-02 DIAGNOSIS — N2581 Secondary hyperparathyroidism of renal origin: Secondary | ICD-10-CM | POA: Diagnosis not present

## 2022-07-02 DIAGNOSIS — Z992 Dependence on renal dialysis: Secondary | ICD-10-CM | POA: Diagnosis not present

## 2022-07-02 DIAGNOSIS — N186 End stage renal disease: Secondary | ICD-10-CM | POA: Diagnosis not present

## 2022-07-02 DIAGNOSIS — D631 Anemia in chronic kidney disease: Secondary | ICD-10-CM | POA: Diagnosis not present

## 2022-07-04 DIAGNOSIS — H2512 Age-related nuclear cataract, left eye: Secondary | ICD-10-CM | POA: Diagnosis not present

## 2022-07-04 DIAGNOSIS — H401123 Primary open-angle glaucoma, left eye, severe stage: Secondary | ICD-10-CM | POA: Diagnosis not present

## 2022-07-06 DIAGNOSIS — N186 End stage renal disease: Secondary | ICD-10-CM | POA: Diagnosis not present

## 2022-07-06 DIAGNOSIS — N2581 Secondary hyperparathyroidism of renal origin: Secondary | ICD-10-CM | POA: Diagnosis not present

## 2022-07-06 DIAGNOSIS — Z992 Dependence on renal dialysis: Secondary | ICD-10-CM | POA: Diagnosis not present

## 2022-07-06 DIAGNOSIS — D631 Anemia in chronic kidney disease: Secondary | ICD-10-CM | POA: Diagnosis not present

## 2022-07-09 DIAGNOSIS — N2581 Secondary hyperparathyroidism of renal origin: Secondary | ICD-10-CM | POA: Diagnosis not present

## 2022-07-09 DIAGNOSIS — Z992 Dependence on renal dialysis: Secondary | ICD-10-CM | POA: Diagnosis not present

## 2022-07-09 DIAGNOSIS — D631 Anemia in chronic kidney disease: Secondary | ICD-10-CM | POA: Diagnosis not present

## 2022-07-09 DIAGNOSIS — N186 End stage renal disease: Secondary | ICD-10-CM | POA: Diagnosis not present

## 2022-07-11 DIAGNOSIS — N186 End stage renal disease: Secondary | ICD-10-CM | POA: Diagnosis not present

## 2022-07-11 DIAGNOSIS — Z992 Dependence on renal dialysis: Secondary | ICD-10-CM | POA: Diagnosis not present

## 2022-07-11 DIAGNOSIS — N2581 Secondary hyperparathyroidism of renal origin: Secondary | ICD-10-CM | POA: Diagnosis not present

## 2022-07-11 DIAGNOSIS — D631 Anemia in chronic kidney disease: Secondary | ICD-10-CM | POA: Diagnosis not present

## 2022-07-13 DIAGNOSIS — D631 Anemia in chronic kidney disease: Secondary | ICD-10-CM | POA: Diagnosis not present

## 2022-07-13 DIAGNOSIS — N2581 Secondary hyperparathyroidism of renal origin: Secondary | ICD-10-CM | POA: Diagnosis not present

## 2022-07-13 DIAGNOSIS — N186 End stage renal disease: Secondary | ICD-10-CM | POA: Diagnosis not present

## 2022-07-13 DIAGNOSIS — Z992 Dependence on renal dialysis: Secondary | ICD-10-CM | POA: Diagnosis not present

## 2022-07-16 DIAGNOSIS — N186 End stage renal disease: Secondary | ICD-10-CM | POA: Diagnosis not present

## 2022-07-16 DIAGNOSIS — Z992 Dependence on renal dialysis: Secondary | ICD-10-CM | POA: Diagnosis not present

## 2022-07-16 DIAGNOSIS — I120 Hypertensive chronic kidney disease with stage 5 chronic kidney disease or end stage renal disease: Secondary | ICD-10-CM | POA: Diagnosis not present

## 2022-07-18 DIAGNOSIS — D631 Anemia in chronic kidney disease: Secondary | ICD-10-CM | POA: Diagnosis not present

## 2022-07-18 DIAGNOSIS — N2581 Secondary hyperparathyroidism of renal origin: Secondary | ICD-10-CM | POA: Diagnosis not present

## 2022-07-18 DIAGNOSIS — Z992 Dependence on renal dialysis: Secondary | ICD-10-CM | POA: Diagnosis not present

## 2022-07-18 DIAGNOSIS — N186 End stage renal disease: Secondary | ICD-10-CM | POA: Diagnosis not present

## 2022-07-20 DIAGNOSIS — N186 End stage renal disease: Secondary | ICD-10-CM | POA: Diagnosis not present

## 2022-07-20 DIAGNOSIS — Z992 Dependence on renal dialysis: Secondary | ICD-10-CM | POA: Diagnosis not present

## 2022-07-20 DIAGNOSIS — D631 Anemia in chronic kidney disease: Secondary | ICD-10-CM | POA: Diagnosis not present

## 2022-07-20 DIAGNOSIS — N2581 Secondary hyperparathyroidism of renal origin: Secondary | ICD-10-CM | POA: Diagnosis not present

## 2022-07-23 DIAGNOSIS — N186 End stage renal disease: Secondary | ICD-10-CM | POA: Diagnosis not present

## 2022-07-23 DIAGNOSIS — N2581 Secondary hyperparathyroidism of renal origin: Secondary | ICD-10-CM | POA: Diagnosis not present

## 2022-07-23 DIAGNOSIS — Z992 Dependence on renal dialysis: Secondary | ICD-10-CM | POA: Diagnosis not present

## 2022-07-23 DIAGNOSIS — D631 Anemia in chronic kidney disease: Secondary | ICD-10-CM | POA: Diagnosis not present

## 2022-07-24 ENCOUNTER — Ambulatory Visit: Payer: Self-pay | Admitting: Licensed Clinical Social Worker

## 2022-07-24 NOTE — Patient Instructions (Signed)
Visit Information  Instructions:   Patient was given the following information about care management and care coordination services today, agreed to services, and gave verbal consent: 1.care management/care coordination services include personalized support from designated clinical staff supervised by their physician, including individualized plan of care and coordination with other care providers 2. 24/7 contact phone numbers for assistance for urgent and routine care needs. 3. The patient may stop care management/care coordination services at any time by phone call to the office staff.  Patient verbalizes understanding of instructions and care plan provided today and agrees to view in MyChart. Active MyChart status and patient understanding of how to access instructions and care plan via MyChart confirmed with patient.     No further follow up required: .  Timothy Ponce, BSW , MSW Social Worker [facility] Care Management  [phone]      

## 2022-07-24 NOTE — Patient Outreach (Signed)
  Care Coordination   Initial Visit Note   07/24/2022 Name: AVYUKT CIMO MRN: [id number] DOB: 24-Mar-1956  [name] is a 66 y.o. year old male who sees [facility] for primary care. I spoke with  [name] by phone today  What matters to the patients health and wellness today?   Continuation of care of Dialysis  Goals Addressed               This Visit's Progress     Care Coordination (pt-stated)        *Patient will continue to attend dialysis appointments 3 days a week.  *SW completed SDOH and Needs assessment and nothing further is needed. *Patient will contact PCP and request SW assistance if needed in the future        SDOH assessments and interventions completed:  Yes     Care Coordination Interventions Activated:  Yes  Care Coordination Interventions:  Yes, provided   Follow up plan: No further intervention required.   Encounter Outcome:  Pt. Visit Completed   Lenor Derrick, MSW  Social Worker [facility] Care Management  [phone]

## 2022-07-25 DIAGNOSIS — D631 Anemia in chronic kidney disease: Secondary | ICD-10-CM | POA: Diagnosis not present

## 2022-07-25 DIAGNOSIS — N186 End stage renal disease: Secondary | ICD-10-CM | POA: Diagnosis not present

## 2022-07-25 DIAGNOSIS — N2581 Secondary hyperparathyroidism of renal origin: Secondary | ICD-10-CM | POA: Diagnosis not present

## 2022-07-25 DIAGNOSIS — Z992 Dependence on renal dialysis: Secondary | ICD-10-CM | POA: Diagnosis not present

## 2022-07-27 DIAGNOSIS — Z992 Dependence on renal dialysis: Secondary | ICD-10-CM | POA: Diagnosis not present

## 2022-07-27 DIAGNOSIS — D631 Anemia in chronic kidney disease: Secondary | ICD-10-CM | POA: Diagnosis not present

## 2022-07-27 DIAGNOSIS — N2581 Secondary hyperparathyroidism of renal origin: Secondary | ICD-10-CM | POA: Diagnosis not present

## 2022-07-27 DIAGNOSIS — N186 End stage renal disease: Secondary | ICD-10-CM | POA: Diagnosis not present

## 2022-07-30 IMAGING — US US EXTREM UP*L* LTD
3 series · 13 of 25 positions shown · non-contrast
Comparison: None.

CLINICAL DATA: Mass of finger of left hand

EXAM:
ULTRASOUND left UPPER EXTREMITY LIMITED
TECHNIQUE: Ultrasound examination of the upper extremity soft tissues was
performed in the area of clinical concern.

[Series 1: us extrem up*left* ltd · 24 acquisitions, 7 frames shown (1 of 3)]
[im 1/24]
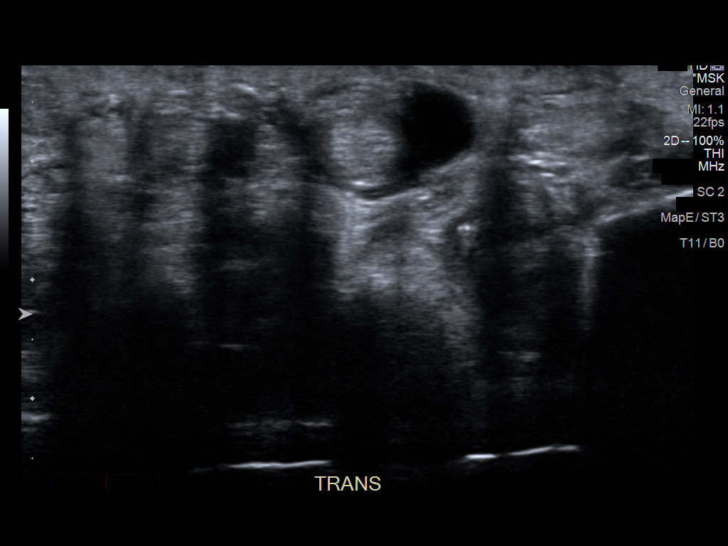
[im 4/24]
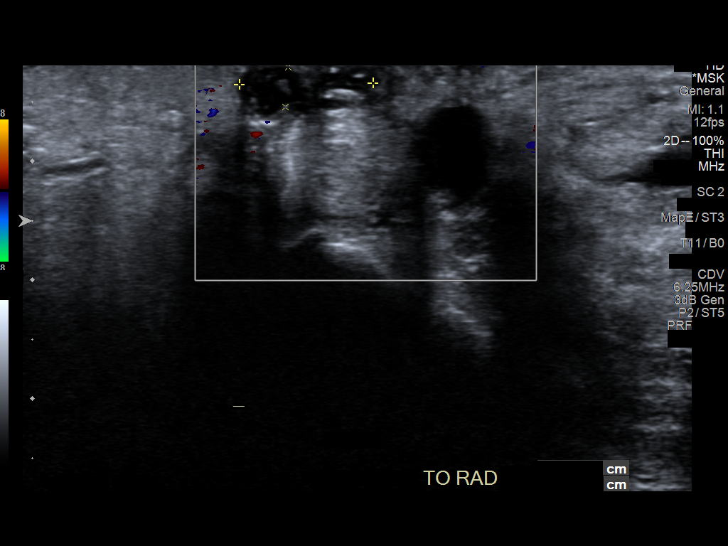
[im 8/24]
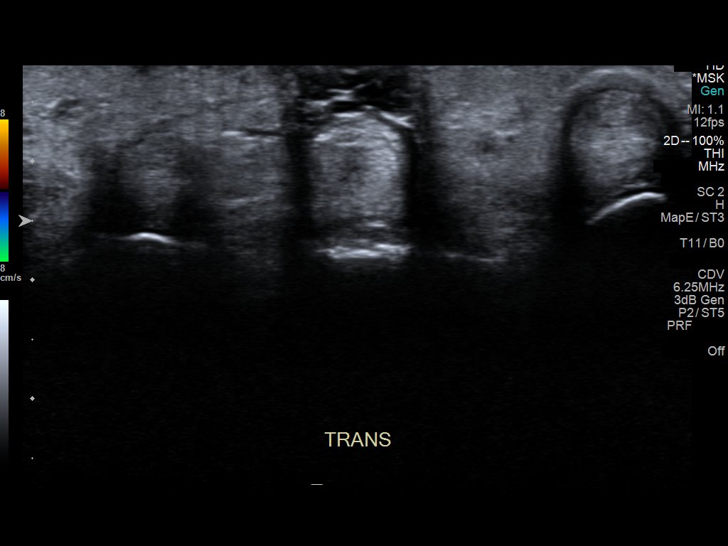
[im 12/24]
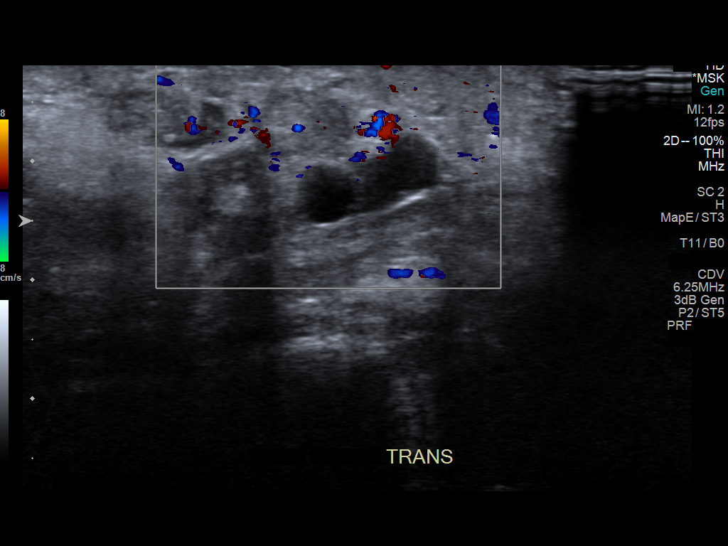
[im 16/24]
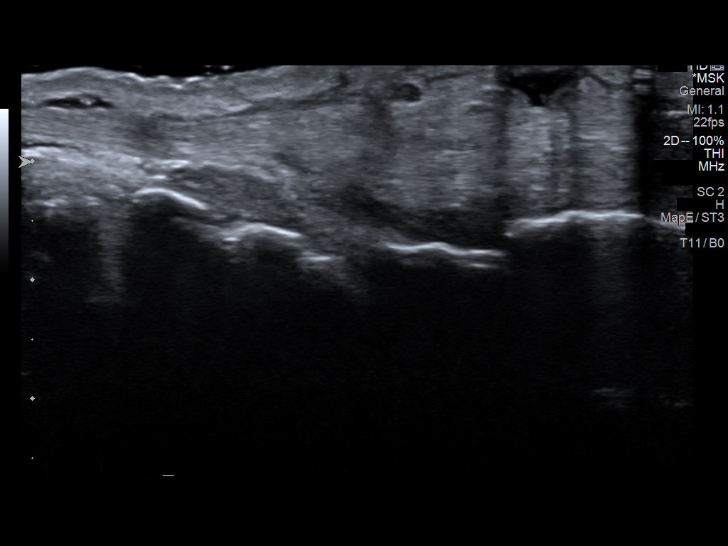
[im 20/24]
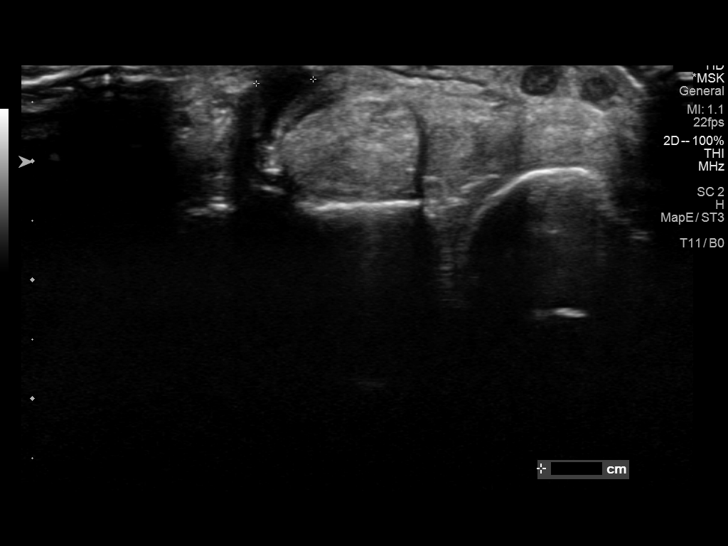
[im 24/24]
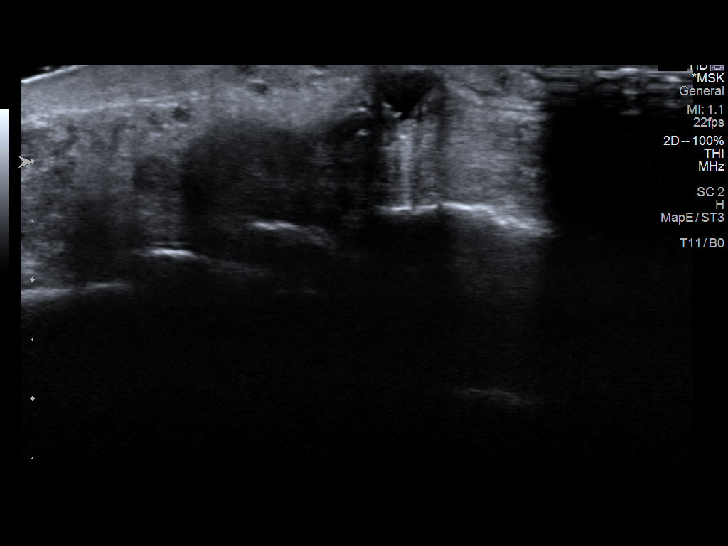

[Series 2: us extrem up*left* ltd · 0.06mm/px · 1 of 3 slices shown (2 of 3)]
[im 3/3]
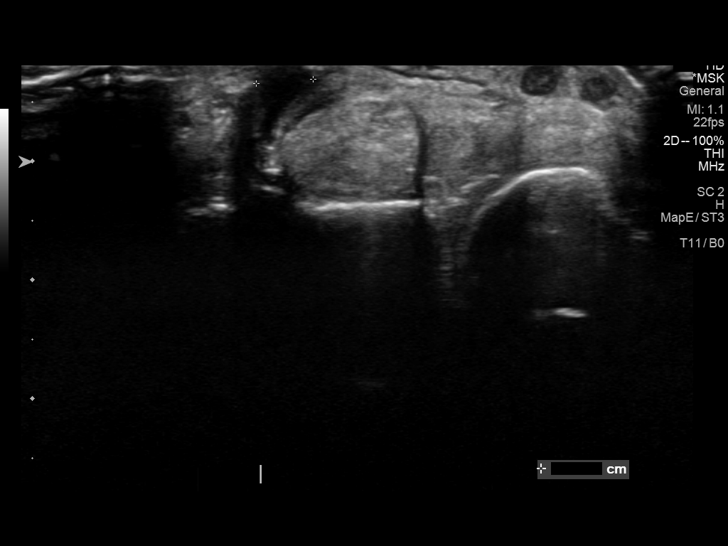

[Series 3: us extrem up*left* ltd · 0.06mm/px · 5 of 18 slices shown (3 of 3)]
[im 2/18]
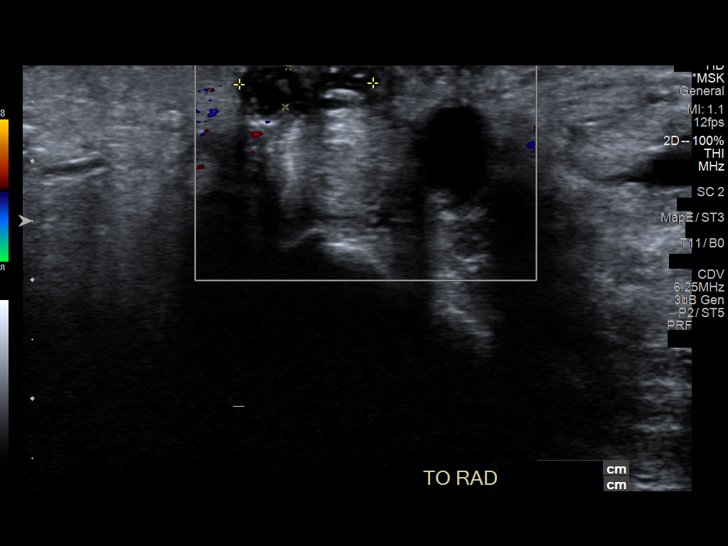
[im 6/18]
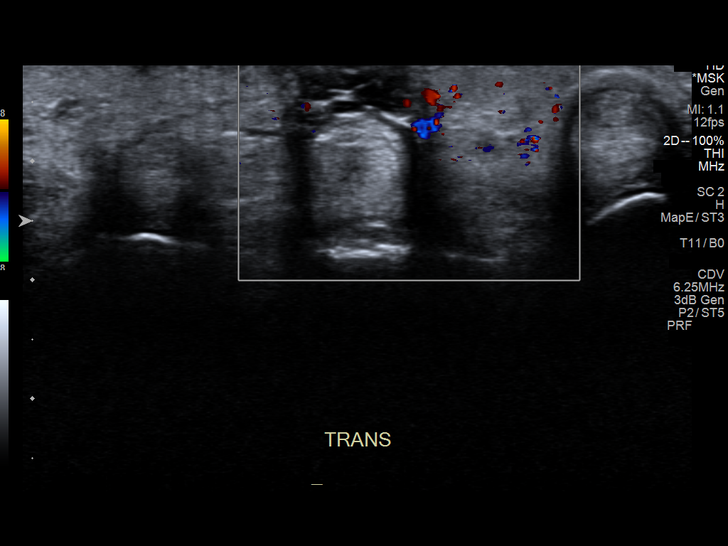
[im 10/18]
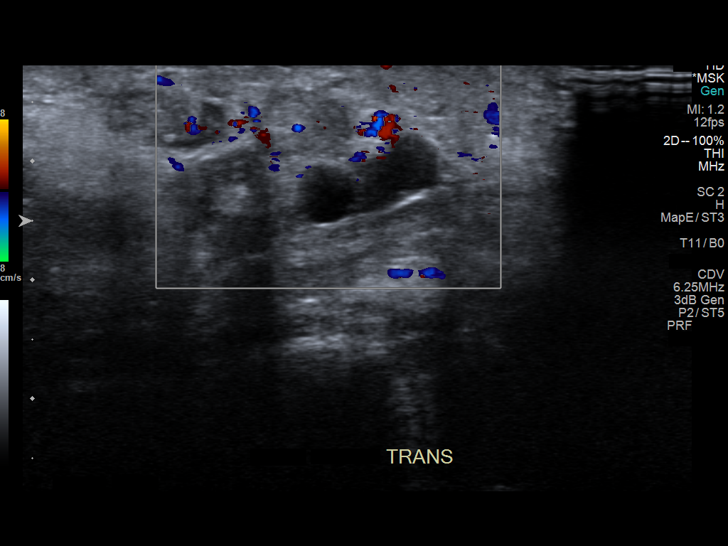
[im 14/18]
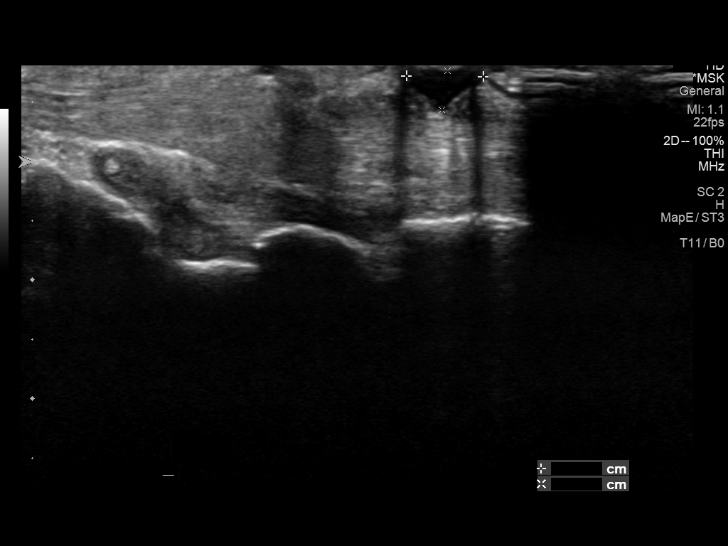
[im 18/18]
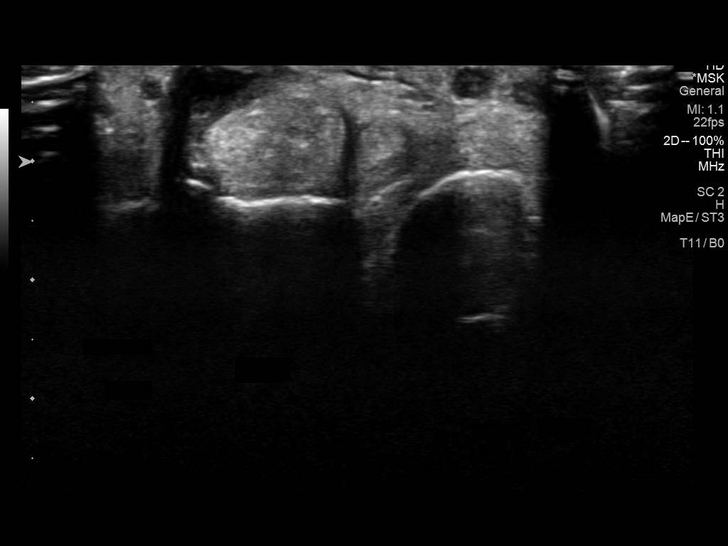

[13 of 25 positions shown; findings below may reference images not displayed]

FINDINGS: Within the palpable area of concern, there is a cystic lesion with
internal debris along a flexor tendon, measuring 1.1 x 0.3 x 0.9 cm.

Additional cystic lesion near the volar thumb measuring 1.1 cm.

On the dorsal side of the wrist, there is another palpable cystic
lesion along extensor tendon measuring 0.7 x 0.3 x 0.5 cm.

There is mild anechoic fluid along the proximal flexor tendons of
the index and ring fingers, suggesting mild focal tenosynovitis.
IMPRESSION: Small cystic lesions, likely ganglion cysts corresponding to the
palpable abnormalities along the volar and dorsal hand/wrist, as
described above.

Findings suggesting mild focal tenosynovitis of the proximal flexor
tendons of the index and ring fingers.

## 2022-08-01 DIAGNOSIS — N2581 Secondary hyperparathyroidism of renal origin: Secondary | ICD-10-CM | POA: Diagnosis not present

## 2022-08-01 DIAGNOSIS — D631 Anemia in chronic kidney disease: Secondary | ICD-10-CM | POA: Diagnosis not present

## 2022-08-01 DIAGNOSIS — Z992 Dependence on renal dialysis: Secondary | ICD-10-CM | POA: Diagnosis not present

## 2022-08-01 DIAGNOSIS — N186 End stage renal disease: Secondary | ICD-10-CM | POA: Diagnosis not present

## 2022-08-03 DIAGNOSIS — Z992 Dependence on renal dialysis: Secondary | ICD-10-CM | POA: Diagnosis not present

## 2022-08-03 DIAGNOSIS — N186 End stage renal disease: Secondary | ICD-10-CM | POA: Diagnosis not present

## 2022-08-03 DIAGNOSIS — D631 Anemia in chronic kidney disease: Secondary | ICD-10-CM | POA: Diagnosis not present

## 2022-08-03 DIAGNOSIS — N2581 Secondary hyperparathyroidism of renal origin: Secondary | ICD-10-CM | POA: Diagnosis not present

## 2022-08-06 DIAGNOSIS — Z992 Dependence on renal dialysis: Secondary | ICD-10-CM | POA: Diagnosis not present

## 2022-08-06 DIAGNOSIS — D631 Anemia in chronic kidney disease: Secondary | ICD-10-CM | POA: Diagnosis not present

## 2022-08-06 DIAGNOSIS — N2581 Secondary hyperparathyroidism of renal origin: Secondary | ICD-10-CM | POA: Diagnosis not present

## 2022-08-06 DIAGNOSIS — N186 End stage renal disease: Secondary | ICD-10-CM | POA: Diagnosis not present

## 2022-08-08 DIAGNOSIS — Z992 Dependence on renal dialysis: Secondary | ICD-10-CM | POA: Diagnosis not present

## 2022-08-08 DIAGNOSIS — D631 Anemia in chronic kidney disease: Secondary | ICD-10-CM | POA: Diagnosis not present

## 2022-08-08 DIAGNOSIS — N186 End stage renal disease: Secondary | ICD-10-CM | POA: Diagnosis not present

## 2022-08-08 DIAGNOSIS — N2581 Secondary hyperparathyroidism of renal origin: Secondary | ICD-10-CM | POA: Diagnosis not present

## 2022-08-10 DIAGNOSIS — D631 Anemia in chronic kidney disease: Secondary | ICD-10-CM | POA: Diagnosis not present

## 2022-08-10 DIAGNOSIS — N2581 Secondary hyperparathyroidism of renal origin: Secondary | ICD-10-CM | POA: Diagnosis not present

## 2022-08-10 DIAGNOSIS — N186 End stage renal disease: Secondary | ICD-10-CM | POA: Diagnosis not present

## 2022-08-10 DIAGNOSIS — Z992 Dependence on renal dialysis: Secondary | ICD-10-CM | POA: Diagnosis not present

## 2022-08-13 DIAGNOSIS — D631 Anemia in chronic kidney disease: Secondary | ICD-10-CM | POA: Diagnosis not present

## 2022-08-13 DIAGNOSIS — Z992 Dependence on renal dialysis: Secondary | ICD-10-CM | POA: Diagnosis not present

## 2022-08-13 DIAGNOSIS — N186 End stage renal disease: Secondary | ICD-10-CM | POA: Diagnosis not present

## 2022-08-13 DIAGNOSIS — N2581 Secondary hyperparathyroidism of renal origin: Secondary | ICD-10-CM | POA: Diagnosis not present

## 2022-08-28 ENCOUNTER — Ambulatory Visit (INDEPENDENT_AMBULATORY_CARE_PROVIDER_SITE_OTHER): Payer: Medicare PPO | Admitting: Podiatry

## 2022-08-28 ENCOUNTER — Encounter: Payer: Self-pay | Admitting: Podiatry

## 2022-08-28 DIAGNOSIS — I999 Unspecified disorder of circulatory system: Secondary | ICD-10-CM

## 2022-08-28 DIAGNOSIS — N186 End stage renal disease: Secondary | ICD-10-CM | POA: Diagnosis not present

## 2022-08-28 DIAGNOSIS — M79675 Pain in left toe(s): Secondary | ICD-10-CM

## 2022-08-28 DIAGNOSIS — Z992 Dependence on renal dialysis: Secondary | ICD-10-CM

## 2022-08-28 DIAGNOSIS — D689 Coagulation defect, unspecified: Secondary | ICD-10-CM | POA: Diagnosis not present

## 2022-08-28 DIAGNOSIS — B351 Tinea unguium: Secondary | ICD-10-CM | POA: Diagnosis not present

## 2022-08-28 DIAGNOSIS — I739 Peripheral vascular disease, unspecified: Secondary | ICD-10-CM

## 2022-08-28 DIAGNOSIS — M79674 Pain in right toe(s): Secondary | ICD-10-CM

## 2022-08-28 NOTE — Progress Notes (Signed)
This patient returns to my office for at risk foot care.  This patient requires this care by a professional since this patient will be at risk due to having ESRD, PAD,coagulation defect and amputation fifth toe right foot.  This patient is unable to cut nails himself since the patient cannot reach his nails.These nails are painful walking and wearing shoes.  This patient presents for at risk foot care today.  General Appearance  Alert, conversant and in no acute stress.  Vascular  Dorsalis pedis and posterior tibial  pulses are weakly palpable  bilaterally.  Capillary return is within normal limits  bilaterally. Cold feet  bilaterally. Absent digital hair  B/L.  Neurologic  Senn-Weinstein monofilament wire test within normal limits  bilaterally. Muscle power within normal limits bilaterally.  Nails Thick disfigured discolored nails with subungual debris  from hallux to fifth toes left and 1-4 right foot. No evidence of bacterial infection or drainage bilaterally.  Orthopedic  No limitations of motion  feet .  No crepitus or effusions noted.  No bony pathology or digital deformities noted.  Skin  normotropic skin with no porokeratosis noted bilaterally.  No signs of infections or ulcers noted.     Onychomycosis  Pain in right toes  Pain in left toes  Consent was obtained for treatment procedures.   Mechanical debridement of nails 1-5  left foot and 1-4 right foot. performed with a nail nipper.  Filed with dremel without incident.    Return office visit   3 months                   Told patient to return for periodic foot care and evaluation due to potential at risk complications.   Gardiner Barefoot DPM

## 2022-10-18 ENCOUNTER — Ambulatory Visit (INDEPENDENT_AMBULATORY_CARE_PROVIDER_SITE_OTHER): Payer: Medicare PPO | Admitting: Physician Assistant

## 2022-10-18 ENCOUNTER — Other Ambulatory Visit: Payer: Self-pay | Admitting: *Deleted

## 2022-10-18 VITALS — BP 114/62 | HR 67 | Temp 97.9°F | Resp 20 | Ht 68.0 in | Wt 134.0 lb

## 2022-10-18 DIAGNOSIS — N186 End stage renal disease: Secondary | ICD-10-CM

## 2022-10-18 DIAGNOSIS — Z992 Dependence on renal dialysis: Secondary | ICD-10-CM

## 2022-10-18 NOTE — Progress Notes (Signed)
Office Note     CC:  follow up Requesting Provider:  [facility] [place]  HPI: Timothy Ponce is a 66 y.o. (1956-05-01) male who presents for evaluation of scabbing overlying right radiocephalic fistula.  He has had plication surgery in December 2021 by Dr. Donnetta Hutching.   He is partially blind and is unsure when the scabbing first appeared.  He denies any episodes of bleeding.  The fistula continues to work well for hemodialysis.  He has large outflow veins throughout the rest of his forearm and into his upper arm however states that he has had several infiltration events when using these veins.  He is a retired Administrator.   Past Medical History:  Diagnosis Date   Cataract    Complication of anesthesia    "Awakens during surgery"   Dyspnea    ESRD (end stage renal disease) ([facility])    TTHSAT, [address]    Glaucoma    Neuropathy    hands - per patient    Past Surgical History:  Procedure Laterality Date   AMPUTATION Right 06/02/2020   Procedure: RIGHT FOOT 5TH RAY AMPUTATION;  Surgeon: Newt Minion, MD;  Location: [facility];  Service: Orthopedics;  Laterality: Right;   AV FISTULA PLACEMENT     INSERTION OF DIALYSIS CATHETER Right 12/08/2020   Procedure: INSERTION OF DIALYSIS CATHETER USING 19cm PALINDROME CHRONIC CATHETER;  Surgeon: Waynetta Sandy, MD;  Location: [facility] OR;  Service: Vascular;  Laterality: Right;   REVISON OF ARTERIOVENOUS FISTULA Right 12/06/2020   Procedure: REVISON OF RIGHT ARM FISTULA;  Surgeon: Rosetta Posner, MD;  Location: [facility] OR;  Service: Vascular;  Laterality: Right;    Social History   Socioeconomic History   Marital status: Single    Spouse name: Not on file   Number of children: Not on file   Years of education: Not on file   Highest education level: Not on file  Occupational History   Not on file  Tobacco Use   Smoking status: Never    Passive exposure: Never   Smokeless tobacco: Never  Vaping Use   Vaping Use: Never used   Substance and Sexual Activity   Alcohol use: Never   Drug use: Never   Sexual activity: Not on file  Other Topics Concern   Not on file  Social History Narrative   Not on file   Social Determinants of Health   Financial Resource Strain: Not on file  Food Insecurity: No Food Insecurity (07/24/2022)   Hunger Vital Sign    Worried About Running Out of Food in the Last Year: Never true    Ran Out of Food in the Last Year: Never true  Transportation Needs: No Transportation Needs (07/24/2022)   PRAPARE - Hydrologist (Medical): No    Lack of Transportation (Non-Medical): No  Physical Activity: Not on file  Stress: Not on file  Social Connections: Not on file  Intimate Partner Violence: Not on file   No family history on file.  Current Outpatient Medications  Medication Sig Dispense Refill   acetaminophen (TYLENOL) 650 MG CR tablet Take 650 mg by mouth daily as needed (foot pain).     bimatoprost (LUMIGAN) 0.01 % SOLN Place 1 drop into both eyes at bedtime.     brimonidine (ALPHAGAN P) 0.1 % SOLN Place 1 drop into both eyes 2 (two) times daily.     brimonidine (ALPHAGAN) 0.2 % ophthalmic solution Place 1 drop into  both eyes 2 times daily.     cinacalcet (SENSIPAR) 30 MG tablet Take 30 mg by mouth daily with supper.     dorzolamide-timolol (COSOPT) 22.3-6.8 MG/ML ophthalmic solution Place 1 drop into both eyes 2 (two) times daily.      ibuprofen (ADVIL) 200 MG tablet Take 200-400 mg by mouth every 6 (six) hours as needed for moderate pain.     iron sucrose (VENOFER) 20 MG/ML injection 2.5 mLs (50 mg total).     multivitamin (RENA-VIT) TABS tablet Take 1 tablet by mouth daily.     Netarsudil Dimesylate (RHOPRESSA) 0.02 % SOLN Place 1 drop into both eyes at bedtime.     oxyCODONE-acetaminophen (PERCOCET/ROXICET) 5-325 MG tablet Take 1 tablet by mouth every 6 (six) hours as needed. 10 tablet 0   sevelamer carbonate (RENVELA) 800 MG tablet Take 1,600-4,000 mg by  mouth See admin instructions. Take 5 tablets (4000 mg) by mouth 2 -3 times daily with meals, take 2 tablets (1600 mg) with snacks     No current facility-administered medications for this visit.    Allergies  Allergen Reactions   Darvon [Propoxyphene] Other (See Comments)    Lungs filling up with fluid (reaction to Darvon/Darvocet)   Iron Dextran Other (See Comments)    Other reaction(s): Breathing Problems   Lactose Other (See Comments)    Gi upset/ sugar in milk   Lactose Intolerance (Gi) Other (See Comments)    Gi upset/ sugar in milk   Milk (Cow) Other (See Comments)    Gi upset     REVIEW OF SYSTEMS:   '[X]'$  denotes positive finding, '[ ]'$  denotes negative finding Cardiac  Comments:  Chest pain or chest pressure:    Shortness of breath upon exertion:    Short of breath when lying flat:    Irregular heart rhythm:        Vascular    Pain in calf, thigh, or hip brought on by ambulation:    Pain in feet at night that wakes you up from your sleep:     Blood clot in your veins:    Leg swelling:         Pulmonary    Oxygen at home:    Productive cough:     Wheezing:         Neurologic    Sudden weakness in arms or legs:     Sudden numbness in arms or legs:     Sudden onset of difficulty speaking or slurred speech:    Temporary loss of vision in one eye:     Problems with dizziness:         Gastrointestinal    Blood in stool:     Vomited blood:         Genitourinary    Burning when urinating:     Blood in urine:        Psychiatric    Major depression:         Hematologic    Bleeding problems:    Problems with blood clotting too easily:        Skin    Rashes or ulcers:        Constitutional    Fever or chills:      PHYSICAL EXAMINATION:  Vitals:   10/18/22 1225  BP: 114/62  Pulse: 67  Resp: 20  Temp: 97.9 F (36.6 C)  TempSrc: Temporal  SpO2: 100%  Weight: 134 lb (60.8 kg)  Height: '5\' 8"'$  (1.727 m)  General:  WDWN in NAD; vital signs  documented above Gait: Not observed HENT: WNL, normocephalic Pulmonary: normal non-labored breathing , without Rales, rhonchi,  wheezing Cardiac: regular HR Abdomen: soft, NT, no masses Skin: without rashes Vascular Exam/Pulses: Palpable thrill through right radiocephalic fistula; 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      ASSESSMENT/PLAN:: 66 y.o. 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  The overlying scab is a concern for spontaneous hemorrhage.  Plan will be to revise the fistula with plication.  He required a plication surgery in 3112.  Afterwards he states he experienced infiltration events from the outflow veins near his AC fossa despite their impressive size.  We will plan to place a TDC at the same time.  He has had right and left IJ TDC's in the past with no imaging evidence of central stenosis based on chart review.  Fistula plication and TDC will be scheduled on a nondialysis day in the near future.  The patient and his mother are agreeable to this plan.  Risks and benefits were reviewed and all questions were answered.   Dagoberto Ligas, PA-C [facility] [phone]  Clinic MD:   Virl Cagey

## 2022-10-24 ENCOUNTER — Encounter (HOSPITAL_COMMUNITY): Payer: Self-pay | Admitting: Vascular Surgery

## 2022-10-24 ENCOUNTER — Other Ambulatory Visit: Payer: Self-pay

## 2022-10-24 NOTE — Progress Notes (Signed)
PCP - Izell [place], MD Cardiologist - denies  PPM/ICD - denies  Chest x-ray - n/a EKG - 10/28/22  CPAP - n/a  Fasting Blood Sugar - n/a  Blood Thinner Instructions: n/a Aspirin Instructions: Patient was instructed: As of today, STOP taking any Aspirin (unless otherwise instructed by your surgeon) Aleve, Naproxen, Ibuprofen, Motrin, Advil, Goody's, BC's, all herbal medications, fish oil, and all vitamins.  ERAS Protcol - n/a  COVID TEST- n/a  Anesthesia review: yes - anesthesia complication (waking up during surgery); dialysis patient  Patient verbally denies any shortness of breath, fever, cough and chest pain during phone call   -------------  SDW INSTRUCTIONS given:  Your procedure is scheduled on Monday, November 13th, 2023.  Report to [facility] Main Entrance "A" at 09:30 A.M., and check in at the Admitting office.  Call this number if you have problems the morning of surgery:  [phone]   Remember:  Do not eat or drink after midnight the night before your surgery    Take these medicines the morning of surgery with A SIP OF WATER: Tylenol - PRN Eye drops   The day of surgery:                     Do not wear jewelry,             Do not wear lotions, powders, colognes, or deodorant.            Men may shave face and neck.            Do not bring valuables to the hospital.            [facility] is not responsible for any belongings or valuables.  Do NOT Smoke (Tobacco/Vaping) 24 hours prior to your procedure If you use a CPAP at night, you may bring all equipment for your overnight stay.   Contacts, glasses, dentures or bridgework may not be worn into surgery.      For patients admitted to the hospital, discharge time will be determined by your treatment team.   Patients discharged the day of surgery will not be allowed to drive home, and someone needs to stay with them for 24 hours.    Special instructions:   [facility]- Preparing For  Surgery  Before surgery, you can play an important role. Because skin is not sterile, your skin needs to be as free of germs as possible. You can reduce the number of germs on your skin by washing with CHG (chlorahexidine gluconate) Soap before surgery.  CHG is an antiseptic cleaner which kills germs and bonds with the skin to continue killing germs even after washing.    Oral Hygiene is also important to reduce your risk of infection.  Remember - BRUSH YOUR TEETH THE MORNING OF SURGERY WITH YOUR REGULAR TOOTHPASTE  Please do not use if you have an allergy to CHG or antibacterial soaps. If your skin becomes reddened/irritated stop using the CHG.  Do not shave (including legs and underarms) for at least 48 hours prior to first CHG shower. It is OK to shave your face.  Please follow these instructions carefully.   Shower the NIGHT BEFORE SURGERY and the MORNING OF SURGERY with DIAL Soap.   Pat yourself dry with a CLEAN TOWEL.  Wear CLEAN PAJAMAS to bed the night before surgery  Place CLEAN SHEETS on your bed the night of your first shower and DO NOT SLEEP WITH PETS.   Day of  Surgery: Please shower morning of surgery  Wear Clean/Comfortable clothing the morning of surgery Do not apply any deodorants/lotions.   Remember to brush your teeth WITH YOUR REGULAR TOOTHPASTE.   Questions were answered. Patient verbalized understanding of instructions.

## 2022-10-28 ENCOUNTER — Other Ambulatory Visit: Payer: Self-pay

## 2022-10-28 ENCOUNTER — Encounter (HOSPITAL_COMMUNITY): Payer: Self-pay | Admitting: Vascular Surgery

## 2022-10-28 ENCOUNTER — Ambulatory Visit (HOSPITAL_COMMUNITY): Payer: Medicare PPO

## 2022-10-28 ENCOUNTER — Ambulatory Visit (HOSPITAL_COMMUNITY): Payer: Medicare PPO | Admitting: Physician Assistant

## 2022-10-28 ENCOUNTER — Ambulatory Visit (HOSPITAL_COMMUNITY)
Admission: RE | Admit: 2022-10-28 | Discharge: 2022-10-28 | Disposition: A | Discharge status: Home / Self Care | Payer: Medicare PPO | Attending: Vascular Surgery | Admitting: Vascular Surgery

## 2022-10-28 ENCOUNTER — Ambulatory Visit (HOSPITAL_BASED_OUTPATIENT_CLINIC_OR_DEPARTMENT_OTHER): Payer: Medicare PPO | Admitting: Physician Assistant

## 2022-10-28 ENCOUNTER — Encounter (HOSPITAL_COMMUNITY)
Admission: RE | Disposition: A | Discharge status: Home / Self Care | Payer: Self-pay | Source: Home / Self Care | Attending: Vascular Surgery

## 2022-10-28 ENCOUNTER — Other Ambulatory Visit (HOSPITAL_COMMUNITY): Payer: Self-pay

## 2022-10-28 DIAGNOSIS — N186 End stage renal disease: Secondary | ICD-10-CM

## 2022-10-28 DIAGNOSIS — I700 Atherosclerosis of aorta: Secondary | ICD-10-CM | POA: Insufficient documentation

## 2022-10-28 DIAGNOSIS — T82898A Other specified complication of vascular prosthetic devices, implants and grafts, initial encounter: Secondary | ICD-10-CM | POA: Insufficient documentation

## 2022-10-28 DIAGNOSIS — Z992 Dependence on renal dialysis: Secondary | ICD-10-CM

## 2022-10-28 DIAGNOSIS — N185 Chronic kidney disease, stage 5: Secondary | ICD-10-CM

## 2022-10-28 DIAGNOSIS — I82C12 Acute embolism and thrombosis of left internal jugular vein: Secondary | ICD-10-CM | POA: Diagnosis not present

## 2022-10-28 DIAGNOSIS — I739 Peripheral vascular disease, unspecified: Secondary | ICD-10-CM

## 2022-10-28 DIAGNOSIS — I728 Aneurysm of other specified arteries: Secondary | ICD-10-CM | POA: Insufficient documentation

## 2022-10-28 DIAGNOSIS — X58XXXA Exposure to other specified factors, initial encounter: Secondary | ICD-10-CM | POA: Insufficient documentation

## 2022-10-28 HISTORY — PX: FISTULA SUPERFICIALIZATION: SHX6341

## 2022-10-28 HISTORY — PX: INSERTION OF DIALYSIS CATHETER: SHX1324

## 2022-10-28 LAB — POCT I-STAT, CHEM 8
BUN: 29 mg/dL — ABNORMAL HIGH (ref 8–23)
Calcium, Ion: 1.1 mmol/L — ABNORMAL LOW (ref 1.15–1.40)
Chloride: 94 mmol/L — ABNORMAL LOW (ref 98–111)
Creatinine, Ser: 11.3 mg/dL — ABNORMAL HIGH (ref 0.61–1.24)
Glucose, Bld: 93 mg/dL (ref 70–99)
HCT: 28 % — ABNORMAL LOW (ref 39.0–52.0)
Hemoglobin: 9.5 g/dL — ABNORMAL LOW (ref 13.0–17.0)
Potassium: 5.3 mmol/L — ABNORMAL HIGH (ref 3.5–5.1)
Sodium: 137 mmol/L (ref 135–145)
TCO2: 35 mmol/L — ABNORMAL HIGH (ref 22–32)

## 2022-10-28 SURGERY — FISTULA SUPERFICIALIZATION
Anesthesia: General | Site: Neck | Laterality: Right

## 2022-10-28 MED ORDER — MIDAZOLAM HCL 2 MG/2ML IJ SOLN
INTRAMUSCULAR | Status: AC
  Filled 2022-10-28: qty 2

## 2022-10-28 MED ORDER — HEPARIN 6000 UNIT IRRIGATION SOLUTION
Status: AC
  Filled 2022-10-28: qty 500

## 2022-10-28 MED ORDER — FENTANYL CITRATE (PF) 100 MCG/2ML IJ SOLN: 25.0000 ug | INTRAMUSCULAR | Status: DC | PRN

## 2022-10-28 MED ORDER — OXYCODONE HCL 5 MG PO TABS
ORAL_TABLET | ORAL | Status: AC
  Filled 2022-10-28: qty 1

## 2022-10-28 MED ORDER — MIDAZOLAM HCL 2 MG/2ML IJ SOLN
INTRAMUSCULAR | Status: DC | PRN
  Administered 2022-10-28: 1 mg via INTRAVENOUS

## 2022-10-28 MED ORDER — HYDROCODONE-ACETAMINOPHEN 5-325 MG PO TABS
1.0000 | ORAL_TABLET | Freq: Four times a day (QID) | ORAL | 0 refills | Status: DC | PRN
  Filled 2022-10-28: qty 20, 5d supply, fill #0

## 2022-10-28 MED ORDER — CHLORHEXIDINE GLUCONATE 4 % EX LIQD: 60.0000 mL | Freq: Once | CUTANEOUS | Status: DC

## 2022-10-28 MED ORDER — HEPARIN SODIUM (PORCINE) 1000 UNIT/ML IJ SOLN
INTRAMUSCULAR | Status: AC
  Filled 2022-10-28: qty 10

## 2022-10-28 MED ORDER — SODIUM CHLORIDE 0.9 % IV SOLN
20.0000 ug | Freq: Once | INTRAVENOUS | Status: AC
  Administered 2022-10-28: 20 ug via INTRAVENOUS
  Filled 2022-10-28: qty 5

## 2022-10-28 MED ORDER — PHENYLEPHRINE 80 MCG/ML (10ML) SYRINGE FOR IV PUSH (FOR BLOOD PRESSURE SUPPORT)
PREFILLED_SYRINGE | INTRAVENOUS | Status: DC | PRN
  Administered 2022-10-28 (×5): 80 ug via INTRAVENOUS

## 2022-10-28 MED ORDER — HEPARIN 6000 UNIT IRRIGATION SOLUTION
Status: DC | PRN
  Administered 2022-10-28: 1

## 2022-10-28 MED ORDER — SODIUM CHLORIDE 0.9 % IV SOLN: INTRAVENOUS | Status: DC

## 2022-10-28 MED ORDER — ONDANSETRON HCL 4 MG/2ML IJ SOLN: 4.0000 mg | Freq: Once | INTRAMUSCULAR | Status: DC | PRN

## 2022-10-28 MED ORDER — OXYCODONE HCL 5 MG PO TABS: 5.0000 mg | ORAL_TABLET | Freq: Once | ORAL | Status: DC | PRN

## 2022-10-28 MED ORDER — OXYCODONE HCL 5 MG/5ML PO SOLN: 5.0000 mg | Freq: Once | ORAL | Status: DC | PRN

## 2022-10-28 MED ORDER — HYDROCODONE-ACETAMINOPHEN 5-325 MG PO TABS: 1.0000 | ORAL_TABLET | Freq: Four times a day (QID) | ORAL | 0 refills | Status: DC | PRN

## 2022-10-28 MED ORDER — CEFAZOLIN SODIUM-DEXTROSE 2-4 GM/100ML-% IV SOLN
2.0000 g | INTRAVENOUS | Status: AC
  Administered 2022-10-28: 2 g via INTRAVENOUS
  Filled 2022-10-28: qty 100

## 2022-10-28 MED ORDER — EPHEDRINE SULFATE-NACL 50-0.9 MG/10ML-% IV SOSY
PREFILLED_SYRINGE | INTRAVENOUS | Status: DC | PRN
  Administered 2022-10-28: 10 mg via INTRAVENOUS

## 2022-10-28 MED ORDER — HEPARIN SODIUM (PORCINE) 1000 UNIT/ML IJ SOLN
INTRAMUSCULAR | Status: DC | PRN
  Administered 2022-10-28: 2000 [IU] via INTRAVENOUS

## 2022-10-28 MED ORDER — LIDOCAINE HCL (PF) 1 % IJ SOLN
INTRAMUSCULAR | Status: AC
  Filled 2022-10-28: qty 30

## 2022-10-28 MED ORDER — CHLORHEXIDINE GLUCONATE 0.12 % MT SOLN
15.0000 mL | Freq: Once | OROMUCOSAL | Status: AC
  Administered 2022-10-28: 15 mL via OROMUCOSAL
  Filled 2022-10-28: qty 15

## 2022-10-28 MED ORDER — IODIXANOL 320 MG/ML IV SOLN
INTRAVENOUS | Status: DC | PRN
  Administered 2022-10-28: 5 mL via INTRAVENOUS

## 2022-10-28 MED ORDER — ONDANSETRON HCL 4 MG/2ML IJ SOLN
INTRAMUSCULAR | Status: DC | PRN
  Administered 2022-10-28: 4 mg via INTRAVENOUS

## 2022-10-28 MED ORDER — ONDANSETRON HCL 4 MG/2ML IJ SOLN
INTRAMUSCULAR | Status: AC
  Filled 2022-10-28: qty 2

## 2022-10-28 MED ORDER — LACTATED RINGERS IV SOLN: INTRAVENOUS | Status: DC

## 2022-10-28 MED ORDER — FENTANYL CITRATE (PF) 250 MCG/5ML IJ SOLN
INTRAMUSCULAR | Status: AC
  Filled 2022-10-28: qty 5

## 2022-10-28 MED ORDER — 0.9 % SODIUM CHLORIDE (POUR BTL) OPTIME
TOPICAL | Status: DC | PRN
  Administered 2022-10-28: 1000 mL

## 2022-10-28 MED ORDER — HEPARIN SODIUM (PORCINE) 1000 UNIT/ML IJ SOLN
INTRAMUSCULAR | Status: DC | PRN
  Administered 2022-10-28: 3200 [IU]

## 2022-10-28 MED ORDER — ACETAMINOPHEN 500 MG PO TABS
1000.0000 mg | ORAL_TABLET | Freq: Once | ORAL | Status: AC
  Administered 2022-10-28: 1000 mg via ORAL
  Filled 2022-10-28: qty 2

## 2022-10-28 MED ORDER — LIDOCAINE 2% (20 MG/ML) 5 ML SYRINGE
INTRAMUSCULAR | Status: DC | PRN
  Administered 2022-10-28: 60 mg via INTRAVENOUS

## 2022-10-28 MED ORDER — FENTANYL CITRATE (PF) 250 MCG/5ML IJ SOLN
INTRAMUSCULAR | Status: DC | PRN
  Administered 2022-10-28 (×3): 25 ug via INTRAVENOUS

## 2022-10-28 MED ORDER — PROPOFOL 10 MG/ML IV BOLUS
INTRAVENOUS | Status: DC | PRN
  Administered 2022-10-28: 100 mg via INTRAVENOUS
  Administered 2022-10-28: 20 mg via INTRAVENOUS

## 2022-10-28 MED ORDER — AMISULPRIDE (ANTIEMETIC) 5 MG/2ML IV SOLN: 10.0000 mg | Freq: Once | INTRAVENOUS | Status: DC | PRN

## 2022-10-28 MED ORDER — ORAL CARE MOUTH RINSE: 15.0000 mL | Freq: Once | OROMUCOSAL | Status: AC

## 2022-10-28 MED ORDER — LIDOCAINE 2% (20 MG/ML) 5 ML SYRINGE
INTRAMUSCULAR | Status: AC
  Filled 2022-10-28: qty 5

## 2022-10-28 MED ORDER — PHENYLEPHRINE HCL-NACL 20-0.9 MG/250ML-% IV SOLN
INTRAVENOUS | Status: DC | PRN
  Administered 2022-10-28: 30 ug/min via INTRAVENOUS

## 2022-10-28 MED ORDER — PROTAMINE SULFATE 10 MG/ML IV SOLN
INTRAVENOUS | Status: DC | PRN
  Administered 2022-10-28: 30 mg via INTRAVENOUS

## 2022-10-28 MED ORDER — FENTANYL CITRATE (PF) 100 MCG/2ML IJ SOLN
INTRAMUSCULAR | Status: AC
  Filled 2022-10-28: qty 2

## 2022-10-28 SURGICAL SUPPLY — 75 items
ARMBAND PINK RESTRICT EXTREMIT (MISCELLANEOUS) ×2 IMPLANT
BAG COUNTER SPONGE SURGICOUNT (BAG) ×2 IMPLANT
BAG DECANTER FOR FLEXI CONT (MISCELLANEOUS) ×2 IMPLANT
BIOPATCH RED 1 DISK 7.0 (GAUZE/BANDAGES/DRESSINGS) ×2 IMPLANT
BNDG ELASTIC 4X5.8 VLCR STR LF (GAUZE/BANDAGES/DRESSINGS) ×2 IMPLANT
BNDG GAUZE DERMACEA FLUFF 4 (GAUZE/BANDAGES/DRESSINGS) IMPLANT
CANISTER SUCT 3000ML PPV (MISCELLANEOUS) ×2 IMPLANT
CATH PALINDROME-P 19CM W/VT (CATHETERS) IMPLANT
CATH PALINDROME-P 23CM W/VT (CATHETERS) IMPLANT
CATH PALINDROME-P 28CM W/VT (CATHETERS) IMPLANT
CATH STRAIGHT 5FR 65CM (CATHETERS) IMPLANT
CLIP LIGATING EXTRA MED SLVR (CLIP) ×2 IMPLANT
CLIP LIGATING EXTRA SM BLUE (MISCELLANEOUS) ×2 IMPLANT
CLIP VESOCCLUDE MED 6/CT (CLIP) ×2 IMPLANT
COVER PROBE W GEL 5X96 (DRAPES) ×2 IMPLANT
COVER SURGICAL LIGHT HANDLE (MISCELLANEOUS) ×2 IMPLANT
DERMABOND ADVANCED .7 DNX12 (GAUZE/BANDAGES/DRESSINGS) ×2 IMPLANT
DRAPE C-ARM 42X72 X-RAY (DRAPES) ×2 IMPLANT
DRAPE CHEST BREAST 15X10 FENES (DRAPES) ×2 IMPLANT
DRAPE ORTHO SPLIT 77X108 STRL (DRAPES) ×2
DRAPE SURG ORHT 6 SPLT 77X108 (DRAPES) IMPLANT
ELECT REM PT RETURN 9FT ADLT (ELECTROSURGICAL) ×2
ELECTRODE REM PT RTRN 9FT ADLT (ELECTROSURGICAL) ×2 IMPLANT
FELT TEFLON 1X6 (MISCELLANEOUS) IMPLANT
GAUZE 4X4 16PLY ~~LOC~~+RFID DBL (SPONGE) ×2 IMPLANT
GLIDEWIRE ADV .035X180CM (WIRE) IMPLANT
GLOVE BIO SURGEON STRL SZ7.5 (GLOVE) ×2 IMPLANT
GLOVE BIOGEL PI IND STRL 8 (GLOVE) ×2 IMPLANT
GLOVE SRG 8 PF TXTR STRL LF DI (GLOVE) ×2 IMPLANT
GLOVE SURG POLYISO LF SZ8 (GLOVE) IMPLANT
GLOVE SURG UNDER POLY LF SZ8 (GLOVE) ×2
GOWN STRL REUS W/ TWL LRG LVL3 (GOWN DISPOSABLE) ×4 IMPLANT
GOWN STRL REUS W/ TWL XL LVL3 (GOWN DISPOSABLE) IMPLANT
GOWN STRL REUS W/TWL 2XL LVL3 (GOWN DISPOSABLE) ×4 IMPLANT
GOWN STRL REUS W/TWL LRG LVL3 (GOWN DISPOSABLE) ×8
GOWN STRL REUS W/TWL XL LVL3 (GOWN DISPOSABLE) ×2
KIT BASIN OR (CUSTOM PROCEDURE TRAY) ×2 IMPLANT
KIT PALINDROME-P 55CM (CATHETERS) IMPLANT
KIT TURNOVER KIT B (KITS) ×2 IMPLANT
NDL 18GX1X1/2 (RX/OR ONLY) (NEEDLE) ×2 IMPLANT
NDL HYPO 25GX1X1/2 BEV (NEEDLE) ×2 IMPLANT
NEEDLE 18GX1X1/2 (RX/OR ONLY) (NEEDLE) ×2 IMPLANT
NEEDLE HYPO 25GX1X1/2 BEV (NEEDLE) ×2 IMPLANT
NS IRRIG 1000ML POUR BTL (IV SOLUTION) ×2 IMPLANT
PACK BASIC III (CUSTOM PROCEDURE TRAY)
PACK CV ACCESS (CUSTOM PROCEDURE TRAY) ×2 IMPLANT
PACK SRG BSC III STRL LF ECLPS (CUSTOM PROCEDURE TRAY) ×2 IMPLANT
PAD ARMBOARD 7.5X6 YLW CONV (MISCELLANEOUS) ×4 IMPLANT
SET MICROPUNCTURE 5F STIFF (MISCELLANEOUS) IMPLANT
SLING ARM FOAM STRAP LRG (SOFTGOODS) IMPLANT
SLING ARM FOAM STRAP MED (SOFTGOODS) IMPLANT
SOAP 2 % CHG 4 OZ (WOUND CARE) ×2 IMPLANT
SPIKE FLUID TRANSFER (MISCELLANEOUS) ×2 IMPLANT
SPONGE T-LAP 18X18 ~~LOC~~+RFID (SPONGE) IMPLANT
STAPLER VISISTAT 35W (STAPLE) IMPLANT
SUT ETHILON 3 0 PS 1 (SUTURE) ×2 IMPLANT
SUT MNCRL AB 4-0 PS2 18 (SUTURE) ×2 IMPLANT
SUT PROLENE 5 0 C 1 24 (SUTURE) IMPLANT
SUT PROLENE 6 0 BV (SUTURE) IMPLANT
SUT PROLENE 7 0 BV 1 (SUTURE) ×2 IMPLANT
SUT VIC AB 2-0 CT1 27 (SUTURE) ×6
SUT VIC AB 2-0 CT1 27XBRD (SUTURE) IMPLANT
SUT VIC AB 2-0 CT1 TAPERPNT 27 (SUTURE) IMPLANT
SUT VIC AB 3-0 SH 27 (SUTURE) ×4
SUT VIC AB 3-0 SH 27X BRD (SUTURE) ×4 IMPLANT
SYR 10ML LL (SYRINGE) ×2 IMPLANT
SYR 20ML LL LF (SYRINGE) ×4 IMPLANT
SYR 5ML LL (SYRINGE) ×2 IMPLANT
SYR CONTROL 10ML LL (SYRINGE) ×2 IMPLANT
TOWEL GREEN STERILE (TOWEL DISPOSABLE) ×2 IMPLANT
TOWEL GREEN STERILE FF (TOWEL DISPOSABLE) ×2 IMPLANT
UNDERPAD 30X36 HEAVY ABSORB (UNDERPADS AND DIAPERS) ×2 IMPLANT
WATER STERILE IRR 1000ML POUR (IV SOLUTION) ×2 IMPLANT
WIRE AMPLATZ SS-J .035X180CM (WIRE) IMPLANT
WIRE TORQFLEX AUST .018X40CM (WIRE) IMPLANT

## 2022-10-28 NOTE — Discharge Instructions (Signed)
Continue ACE wrap to R arm for 48-72 hours

## 2022-10-28 NOTE — Anesthesia Preprocedure Evaluation (Addendum)
Anesthesia Evaluation  Patient identified by MRN, date of birth, ID band Patient awake    Reviewed: Allergy & Precautions, NPO status , Patient's Chart, lab work & pertinent test results  History of Anesthesia Complications (+) AWARENESS UNDER ANESTHESIA and history of anesthetic complications  Airway Mallampati: II  TM Distance: >3 FB Neck ROM: Full    Dental  (+) Missing,    Pulmonary neg pulmonary ROS   Pulmonary exam normal        Cardiovascular + Peripheral Vascular Disease  Normal cardiovascular exam     Neuro/Psych negative neurological ROS  negative psych ROS   GI/Hepatic negative GI ROS, Neg liver ROS,,,  Endo/Other  negative endocrine ROS    Renal/GU Dialysis and ESRFRenal disease (HD T/Th/Sa)  negative genitourinary   Musculoskeletal negative musculoskeletal ROS (+)    Abdominal   Peds  Hematology negative hematology ROS (+)   Anesthesia Other Findings Day of surgery medications reviewed with patient.  Reproductive/Obstetrics negative OB ROS                             Anesthesia Physical Anesthesia Plan  ASA: 3  Anesthesia Plan: General   Post-op Pain Management: Tylenol PO (pre-op)*   Induction: Intravenous  PONV Risk Score and Plan: 2 and Treatment may vary due to age or medical condition, Ondansetron, Midazolam and Dexamethasone  Airway Management Planned: LMA  Additional Equipment: None  Intra-op Plan:   Post-operative Plan: Extubation in OR  Informed Consent: I have reviewed the patients History and Physical, chart, labs and discussed the procedure including the risks, benefits and alternatives for the proposed anesthesia with the patient or authorized representative who has indicated his/her understanding and acceptance.     Dental advisory given  Plan Discussed with: CRNA  Anesthesia Plan Comments:         Anesthesia Quick Evaluation

## 2022-10-28 NOTE — Transfer of Care (Signed)
Immediate Anesthesia Transfer of Care Note  Patient: Timothy Ponce  Procedure(s) Performed: RIGHT ARM ARTERIOVENOUS FISTULA REVISION WITH COMPLEX SKIN AND SOFT TISSUE CLOSURE (Right: Arm Upper) INSERTION OF RIGHT INTERNAL JUGULAR TUNNELED DIALYSIS CATHETER (Right: Neck)  Patient Location: PACU  Anesthesia Type:General  Level of Consciousness: awake, alert , and oriented  Airway & Oxygen Therapy: Patient Spontanous Breathing and Patient connected to nasal cannula oxygen  Post-op Assessment: Report given to RN, Post -op Vital signs reviewed and stable, Patient moving all extremities X 4, and Patient able to stick tongue midline  Post vital signs: Reviewed  Last Vitals:  Vitals Value Taken Time  BP 136/73   Temp 97.6   Pulse 56 10/28/22 1444  Resp 15   SpO2 100 % 10/28/22 1444  Vitals shown include unvalidated device data.  Last Pain:  Vitals:   10/28/22 1010  TempSrc:   PainSc: 4       Patients Stated Pain Goal: 0 (10/18/14 9458)  Complications: No notable events documented.

## 2022-10-28 NOTE — Anesthesia Postprocedure Evaluation (Signed)
Anesthesia Post Note  Patient: Timothy Ponce  Procedure(s) Performed: RIGHT ARM ARTERIOVENOUS FISTULA REVISION WITH COMPLEX SKIN AND SOFT TISSUE CLOSURE (Right: Arm Upper) INSERTION OF RIGHT INTERNAL JUGULAR TUNNELED DIALYSIS CATHETER (Right: Neck)     Patient location during evaluation: PACU Anesthesia Type: General Level of consciousness: awake and alert Pain management: pain level controlled Vital Signs Assessment: post-procedure vital signs reviewed and stable Respiratory status: spontaneous breathing, nonlabored ventilation and respiratory function stable Cardiovascular status: blood pressure returned to baseline Postop Assessment: no apparent nausea or vomiting Anesthetic complications: no   No notable events documented.  Last Vitals:  Vitals:   10/28/22 0914 10/28/22 1445  BP: 130/70 136/73  Pulse: 70 (!) 56  Resp: 17 (!) 9  Temp: 37.1 C 36.4 C  SpO2:  100%    Last Pain:  Vitals:   10/28/22 1530  TempSrc:   PainSc: [place]

## 2022-10-28 NOTE — H&P (Signed)
Office Note   Patient seen and examined in preop holding.  No complaints. No changes to medication history or physical exam since last seen in clinic. After discussing the risks and benefits of fistula revision with TDC placement, Timothy Ponce elected to proceed.   Broadus John MD   CC:  follow up Requesting Provider:  No ref. provider found  HPI: Timothy Ponce is a 66 y.o. (June 08, 1956) male who presents for evaluation of scabbing overlying right radiocephalic fistula.  He has had plication surgery in December 2021 by Dr. Donnetta Hutching.   He is partially blind and is unsure when the scabbing first appeared.  He denies any episodes of bleeding.  The fistula continues to work well for hemodialysis.  He has large outflow veins throughout the rest of his forearm and into his upper arm however states that he has had several infiltration events when using these veins.  He is a retired Administrator.   Past Medical History:  Diagnosis Date   Cataract    Complication of anesthesia    "Awakens during surgery"   Dyspnea    ESRD (end stage renal disease) ([place])    TTHSAT, [address]    Glaucoma    Neuropathy    hands - per patient    Past Surgical History:  Procedure Laterality Date   AMPUTATION Right 06/02/2020   Procedure: RIGHT FOOT 5TH RAY AMPUTATION;  Surgeon: Newt Minion, MD;  Location: [facility];  Service: Orthopedics;  Laterality: Right;   AV FISTULA PLACEMENT     EYE SURGERY     cataract bilateral   INSERTION OF DIALYSIS CATHETER Right 12/08/2020   Procedure: INSERTION OF DIALYSIS CATHETER USING 19cm PALINDROME CHRONIC CATHETER;  Surgeon: Waynetta Sandy, MD;  Location: [facility] OR;  Service: Vascular;  Laterality: Right;   REVISON OF ARTERIOVENOUS FISTULA Right 12/06/2020   Procedure: REVISON OF RIGHT ARM FISTULA;  Surgeon: Rosetta Posner, MD;  [facility];  Service: Vascular;  Laterality: Right;    Social History   Socioeconomic History   Marital status:  Single    Spouse name: Not on file   Number of children: Not on file   Years of education: Not on file   Highest education level: Not on file  Occupational History   Not on file  Tobacco Use   Smoking status: Never    Passive exposure: Never   Smokeless tobacco: Never  Vaping Use   Vaping Use: Never used  Substance and Sexual Activity   Alcohol use: Never   Drug use: Never   Sexual activity: Not on file  Other Topics Concern   Not on file  Social History Narrative   Not on file   Social Determinants of Health   Financial Resource Strain: Not on file  Food Insecurity: No Food Insecurity (07/24/2022)   Hunger Vital Sign    Worried About Running Out of Food in the Last Year: Never true    Ran Out of Food in the Last Year: Never true  Transportation Needs: No Transportation Needs (07/24/2022)   PRAPARE - Hydrologist (Medical): No    Lack of Transportation (Non-Medical): No  Physical Activity: Not on file  Stress: Not on file  Social Connections: Not on file  Intimate Partner Violence: Not on file   History reviewed. No pertinent family history.  Current Facility-Administered Medications  Medication Dose Route Frequency Provider Last Rate Last Admin   0.9 %  sodium chloride infusion   Intravenous Continuous Broadus John, MD 10 mL/hr at 10/28/22 1054 Continued from Pre-op at 10/28/22 1054   ceFAZolin (ANCEF) IVPB 2g/100 mL premix  2 g Intravenous 30 min Pre-Op Broadus John, MD       chlorhexidine (HIBICLENS) 4 % liquid 4 Application  60 mL Topical Once Broadus John, MD       And   [START ON 10/29/2022] chlorhexidine (HIBICLENS) 4 % liquid 4 Application  60 mL Topical Once Broadus John, MD       fentaNYL (SUBLIMAZE) 100 MCG/2ML injection            midazolam (VERSED) 2 MG/2ML injection             Allergies  Allergen Reactions   Iron Dextran Shortness Of Breath    Infed Iron Infusion    Darvon [Propoxyphene] Other (See Comments)     Lungs filling up with fluid (reaction to Darvon/Darvocet)     Lactose Other (See Comments)    Gi upset/ sugar in milk   Lactose Intolerance (Gi) Other (See Comments)    Gi upset/ sugar in milk   Milk (Cow) Other (See Comments)    Gi upset     REVIEW OF SYSTEMS:   '[X]'$  denotes positive finding, '[ ]'$  denotes negative finding Cardiac  Comments:  Chest pain or chest pressure:    Shortness of breath upon exertion:    Short of breath when lying flat:    Irregular heart rhythm:        Vascular    Pain in calf, thigh, or hip brought on by ambulation:    Pain in feet at night that wakes you up from your sleep:     Blood clot in your veins:    Leg swelling:         Pulmonary    Oxygen at home:    Productive cough:     Wheezing:         Neurologic    Sudden weakness in arms or legs:     Sudden numbness in arms or legs:     Sudden onset of difficulty speaking or slurred speech:    Temporary loss of vision in one eye:     Problems with dizziness:         Gastrointestinal    Blood in stool:     Vomited blood:         Genitourinary    Burning when urinating:     Blood in urine:        Psychiatric    Major depression:         Hematologic    Bleeding problems:    Problems with blood clotting too easily:        Skin    Rashes or ulcers:        Constitutional    Fever or chills:      PHYSICAL EXAMINATION:  Vitals:   10/28/22 0914  BP: 130/70  Pulse: 70  Resp: 17  Temp: 98.7 F (37.1 C)  TempSrc: Oral  Weight: 59 kg  Height: '5\' 8"'$  (1.727 m)    General:  WDWN in NAD; vital signs documented above Gait: Not observed HENT: WNL, normocephalic Pulmonary: normal non-labored breathing , without Rales, rhonchi,  wheezing Cardiac: regular HR Abdomen: soft, NT, no masses Skin: without rashes Vascular Exam/Pulses: Palpable thrill through right radiocephalic fistula; 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      ASSESSMENT/PLAN:: 66 y.o. 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  The overlying scab is a concern for spontaneous hemorrhage.  Plan will be to revise the fistula with plication.  He required a plication surgery in 4492.  Afterwards he states he experienced infiltration events from the outflow veins near his AC fossa despite their impressive size.  We will plan to place a TDC at the same time.  He has had right and left IJ TDC's in the past with no imaging evidence of central stenosis based on chart review.  Fistula plication and TDC will be scheduled on a nondialysis day in the near future.  The patient and his mother are agreeable to this plan.  Risks and benefits were reviewed and all questions were answered.   Broadus John, PA-C [facility] [phone]  Clinic MD:   Virl Cagey

## 2022-10-28 NOTE — Anesthesia Procedure Notes (Signed)
Procedure Name: LMA Insertion Date/Time: 10/28/2022 12:49 PM  Performed by: Maude Leriche, CRNAPre-anesthesia Checklist: Patient identified, Emergency Drugs available, Suction available, Patient being monitored and Timeout performed Patient Re-evaluated:Patient Re-evaluated prior to induction Oxygen Delivery Method: Circle system utilized Preoxygenation: Pre-oxygenation with 100% oxygen Induction Type: IV induction LMA: LMA inserted LMA Size: 4.0 Number of attempts: 1 Placement Confirmation: positive ETCO2 and breath sounds checked- equal and bilateral Tube secured with: Tape Dental Injury: Teeth and Oropharynx as per pre-operative assessment

## 2022-10-28 NOTE — Op Note (Signed)
NAME: Timothy Ponce    MRN: [id number] DOB: Apr 11, 1956    DATE OF OPERATION: 10/28/2022  PREOP DIAGNOSIS:    End stage renal disease  POSTOP DIAGNOSIS:    Same  PROCEDURE:    Right arm radiocephalic fistula revision Complex skin and soft tissue closure Right internal jugular vein tunneled dialysis catheter placement-19 cm  SURGEON: Broadus John  ASSIST: Dagoberto Ligas, PA  ANESTHESIA: General  EBL: 250 mL  INDICATIONS:    Timothy Ponce is a 66 y.o. male with a 32-year history of end-stage renal disease currently being dialyzed through a right arm radiocephalic fistula which he has had since 2006.  This is undergone multiple revisions.  He was recently seen in the office with skin thinning and eschar formation of burning portion.  After discussing the risk and benefits of aneurysm plication to prevent spontaneous hemorrhage, Timothy Ponce elected to proceed.  We discussed placement of tunneled dialysis catheter for fistula rest during wound healing.  FINDINGS:   Large, aneurysmal, right arm radiocephalic fistula Occluded left IJ  TECHNIQUE:   Patient was brought to the elevated supine position.  General anesthesia the patient was prepped draped in standard fashion.  Using ultrasound guidance the left internal jugular vein was accessed with micropuncture technique.  The left internal jugular vein was found to be occluded centrally.  Next, I moved to the right internal jugular vein.  This was accessed using an ultrasound-guided micropuncture needle.  Through the micropuncture sheath a floppy J-wire was advanced into the superior vena cava.  A small incision was made around the skin access point.  A counterincision was made in the chest under the clavicle.  A 19 cm tunneled dialysis catheter was then tunneled under the skin, over the clavicle into the incision in the neck.  The access point was serially dilated under direct fluoroscopic guidance.  A peel-away sheath was  introduced into the superior vena cava under fluoroscopic guidance.  The tunneling device was removed and the catheter fed through the peel-away sheath into the superior vena cava.  The peel-away sheath was removed and the catheter gently pulled back.  Adequate position was confirmed with x-ray.  The catheter was tested and found to flush and draw back well.  Catheter was heparin locked.  Caps were applied.  Catheter was sutured to the skin.  The neck incision was closed with 4-0 Monocryl.   Next, my attention turned to the right arm. A large ellipse incision was made around the aneurysmal portion of the radiocephalic fistula which contain the 8 mm eschar.  The fistula was controlled both proximally and distally.  Patient was given 2000 units IV heparin, and the fistula was clamped.  Next, the anterior portion of the fistula was removed, including the aneurysmal portion containing the eschar.  Running 5-0 Prolene was used to reapposed the sides of the fistula creating a lumen approximately 6 mm in diameter.   A significant amount of time was needed for hemostasis.  The patient was given protamine as well as DDAVP.  Next, the ellipsed incision was closed in layers.  The overall size of the incision was 14 cm on the forearm.  This required considerable time to close and necessitated undermining to allow for a tension-free suture.   At case completion, the patient had a pulse of the wrist with an excellent thrill central to the revision.  Macie Burows, MD [facility] DATE OF DICTATION:   10/28/2022

## 2022-10-29 ENCOUNTER — Encounter (HOSPITAL_COMMUNITY): Payer: Self-pay | Admitting: Vascular Surgery

## 2022-10-30 ENCOUNTER — Other Ambulatory Visit (HOSPITAL_COMMUNITY): Payer: Self-pay

## 2022-11-04 ENCOUNTER — Telehealth: Payer: Self-pay

## 2022-11-04 NOTE — Telephone Encounter (Signed)
Melissa with [facility] called stating that the pt was requesting a post surgical wound evaluation asap.  Reviewed pt's chart, returned call for clarification, two identifiers used. 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 Melissa stated that she wanted to leave the incision open to air, but the pt was requesting for an evaluation and incision care orders from [facility]. She stated the site looked appropriate and there were no issues at this time. Appt scheduled for 11/21 per request. Confirmed understanding.

## 2022-11-05 ENCOUNTER — Ambulatory Visit (INDEPENDENT_AMBULATORY_CARE_PROVIDER_SITE_OTHER): Payer: Medicare PPO | Admitting: Physician Assistant

## 2022-11-05 VITALS — BP 109/62 | HR 73 | Temp 97.7°F | Wt 131.1 lb

## 2022-11-05 DIAGNOSIS — Z992 Dependence on renal dialysis: Secondary | ICD-10-CM

## 2022-11-05 DIAGNOSIS — N186 End stage renal disease: Secondary | ICD-10-CM

## 2022-11-05 DIAGNOSIS — R29898 Other symptoms and signs involving the musculoskeletal system: Secondary | ICD-10-CM

## 2022-11-05 NOTE — Progress Notes (Signed)
POST OPERATIVE OFFICE NOTE    CC:  F/u for surgery  HPI:  This is a 66 y.o. male who is s/p plication right forearm for aneurysm on 10/28/22 by Dr. Virl Cagey.  He was worried about the incision and healing.  He had asked the dialysis staff to change the dressing for him and they said they could not.  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  He was directed to return to our office to exam the incision.    Pt returns today for follow up.  Pt states he has not had drainage or incisional pain.  He has a history of progressive B hand numbness and weakness.  He has edema in the right hand > left today.  This is his baseline these symptoms have been present prior to the surgery.   Allergies  Allergen Reactions   Iron Dextran Shortness Of Breath    Infed Iron Infusion    Darvon [Propoxyphene] Other (See Comments)    Lungs filling up with fluid (reaction to Darvon/Darvocet)     Lactose Other (See Comments)    Gi upset/ sugar in milk   Lactose Intolerance (Gi) Other (See Comments)    Gi upset/ sugar in milk   Milk (Cow) Other (See Comments)    Gi upset    Current Outpatient Medications  Medication Sig Dispense Refill   acetaminophen (TYLENOL) 650 MG CR tablet Take 650 mg by mouth every 8 (eight) hours as needed (foot pain).     bimatoprost (LUMIGAN) 0.01 % SOLN Place 1 drop into both eyes at bedtime.     brimonidine (ALPHAGAN) 0.2 % ophthalmic solution Place 1 drop into both eyes in the morning and at bedtime.     cinacalcet (SENSIPAR) 30 MG tablet Take 30 mg by mouth daily with supper.     dorzolamide-timolol (COSOPT) 22.3-6.8 MG/ML ophthalmic solution Place 1 drop into both eyes 2 (two) times daily.      HYDROcodone-acetaminophen (NORCO) 5-325 MG tablet Take 1 tablet by mouth every 6 (six) hours as needed for moderate pain. 20 tablet 0   ibuprofen (ADVIL) 200 MG tablet Take 200-400 mg by mouth every 6 (six) hours as needed for moderate pain.     multivitamin (RENA-VIT) TABS  tablet Take 1 tablet by mouth daily.     Netarsudil Dimesylate (RHOPRESSA) 0.02 % SOLN Place 1 drop into both eyes at bedtime.     sevelamer carbonate (RENVELA) 800 MG tablet Take 1,600-4,000 mg by mouth See admin instructions. Take 5 tablets (4000 mg) by mouth 2 -3 times daily with meals, take 2 tablets (1600 mg) with snacks     No current facility-administered medications for this visit.     ROS:  See HPI  Physical Exam:    Incision:  well healing incision Extremities:  doppler radial/ulnar/palmer signals.  Right hand edema with weak motor and some muscle waisting. Neuro: decrease sensation B hands, biceps MMT intact WNL, shoulder shrug WNL Lungs:  no labored breathing    Assessment/Plan:  This is a 66 y.o. male who is s/p:right forearm placation with staple closure.  No evidence of drainage, hematoma or infection.    He will return next week for staple removal.    -I have referred him to a Neurologist for weakness, and motor loss B UE.  He has good arterial inflow via doppler signals and palpable radial pulse.     Roxy Horseman PA-C [facility] [phone]   Clinic MD:  Carlis Abbott

## 2022-11-12 ENCOUNTER — Encounter: Payer: Self-pay | Admitting: Neurology

## 2022-11-15 ENCOUNTER — Ambulatory Visit (INDEPENDENT_AMBULATORY_CARE_PROVIDER_SITE_OTHER): Payer: Medicare PPO | Admitting: Physician Assistant

## 2022-11-15 VITALS — BP 101/57 | HR 66 | Temp 97.6°F | Resp 16 | Ht 68.0 in | Wt 132.0 lb

## 2022-11-15 DIAGNOSIS — Z992 Dependence on renal dialysis: Secondary | ICD-10-CM

## 2022-11-15 DIAGNOSIS — N186 End stage renal disease: Secondary | ICD-10-CM

## 2022-11-15 NOTE — Progress Notes (Signed)
POST OPERATIVE OFFICE NOTE    CC:  F/u for surgery  HPI:  Zacharie Portner is a 66 y.o. male who is s/p right radiocephalic fistula revision and plication on 16/09/9603 by Dr. Virl Cagey.  At that time a right IJ [facility] was also placed.  This was done to address eschar formation over an aneurysmal area of the fistula.  The patient has had this fistula since 2006 with multiple previous revisions.  Pt returns today for follow up.  History is mostly obtained by the patient's family since the patient is blind.  Family feels that the incision has been healing well without drainage or bleeding.  The patient denies any pain in the right arm or right hand.  He denies any right hand coldness, numbness, or difficulty with grip strength.  He is still having a little bit of itching around his right IJ TDC.  He dialyzes on Tuesdays, Thursdays, and Saturdays at [facility] on [address].  Allergies  Allergen Reactions   Iron Dextran Shortness Of Breath    Infed Iron Infusion    Darvon [Propoxyphene] Other (See Comments)    Lungs filling up with fluid (reaction to Darvon/Darvocet)     Lactose Other (See Comments)    Gi upset/ sugar in milk   Lactose Intolerance (Gi) Other (See Comments)    Gi upset/ sugar in milk   Milk (Cow) Other (See Comments)    Gi upset    Current Outpatient Medications  Medication Sig Dispense Refill   acetaminophen (TYLENOL) 650 MG CR tablet Take 650 mg by mouth every 8 (eight) hours as needed (foot pain).     bimatoprost (LUMIGAN) 0.01 % SOLN Place 1 drop into both eyes at bedtime.     brimonidine (ALPHAGAN) 0.2 % ophthalmic solution Place 1 drop into both eyes in the morning and at bedtime.     cinacalcet (SENSIPAR) 30 MG tablet Take 30 mg by mouth daily with supper.     dorzolamide-timolol (COSOPT) 22.3-6.8 MG/ML ophthalmic solution Place 1 drop into both eyes 2 (two) times daily.      HYDROcodone-acetaminophen (NORCO) 5-325 MG tablet Take 1 tablet by mouth every 6 (six)  hours as needed for moderate pain. 20 tablet 0   ibuprofen (ADVIL) 200 MG tablet Take 200-400 mg by mouth every 6 (six) hours as needed for moderate pain.     multivitamin (RENA-VIT) TABS tablet Take 1 tablet by mouth daily.     Netarsudil Dimesylate (RHOPRESSA) 0.02 % SOLN Place 1 drop into both eyes at bedtime.     sevelamer carbonate (RENVELA) 800 MG tablet Take 1,600-4,000 mg by mouth See admin instructions. Take 5 tablets (4000 mg) by mouth 2 -3 times daily with meals, take 2 tablets (1600 mg) with snacks     No current facility-administered medications for this visit.     ROS:  See HPI  Physical Exam:  Incision: Right forearm incision well-healed with no dehiscence or signs of infection.  All staples removed without issue and incision dressed with dry gauze dressing. Extremities: Right radiocephalic fistula with palpable thrill and good bruit on auscultation Neuro: Intact motor and sensation of the right upper extremity     Assessment/Plan:  This is a 66 y.o. male who is s/p: Revision and plication of right radiocephalic fistula with placement of right IJ TDC on 10/28/2022 by Dr. Virl Cagey   -The patient's right forearm incision has healed well.  All staples were removed today with minimal bleeding.  There was no signs of  incisional dehiscence or infection.  The incision was dressed with a dry gauze dressing and can be removed tomorrow -On exam today, the patient's right radiocephalic fistula has a great thrill on palpation -I believe the patient's fistula will be ready for use again in 1 month.  I would like to give the incision more time to fully heal and reduce risk of infection from cannulation -The patient can continue to use his right IJ [facility] for 1 month and try to use the fistula on December 16, 2022.  If his fistula works well without issues, the [facility] can be removed after 2-3 sessions.  This will be arranged by the patient's nephrologist -The patient can follow-up with [place] as  needed   Vicente Serene, PA-C [facility] [phone]   Clinic MD: Virl Cagey

## 2022-11-19 ENCOUNTER — Encounter (HOSPITAL_COMMUNITY): Payer: Self-pay | Admitting: Emergency Medicine

## 2022-11-19 ENCOUNTER — Emergency Department (HOSPITAL_COMMUNITY)
Admission: EM | Admit: 2022-11-19 | Discharge: 2022-11-20 | Disposition: A | Discharge status: Home / Self Care | Payer: Medicare PPO | Attending: Emergency Medicine | Admitting: Emergency Medicine

## 2022-11-19 ENCOUNTER — Emergency Department (HOSPITAL_COMMUNITY): Payer: Medicare PPO

## 2022-11-19 DIAGNOSIS — M25552 Pain in left hip: Secondary | ICD-10-CM | POA: Insufficient documentation

## 2022-11-19 DIAGNOSIS — Z992 Dependence on renal dialysis: Secondary | ICD-10-CM | POA: Diagnosis not present

## 2022-11-19 LAB — BASIC METABOLIC PANEL
Anion gap: 10 (ref 5–15)
BUN: 13 mg/dL (ref 8–23)
CO2: 35 mmol/L — ABNORMAL HIGH (ref 22–32)
Calcium: 8.5 mg/dL — ABNORMAL LOW (ref 8.9–10.3)
Chloride: 92 mmol/L — ABNORMAL LOW (ref 98–111)
Creatinine, Ser: 6.84 mg/dL — ABNORMAL HIGH (ref 0.61–1.24)
GFR, Estimated: 8 mL/min — ABNORMAL LOW (ref >60)
Glucose, Bld: 123 mg/dL — ABNORMAL HIGH (ref 70–99)
Potassium: 3.8 mmol/L (ref 3.5–5.1)
Sodium: 137 mmol/L (ref 135–145)

## 2022-11-19 LAB — CBC WITH DIFFERENTIAL/PLATELET
Abs Immature Granulocytes: 0.01 10*3/uL (ref 0.00–0.07)
Basophils Absolute: 0 10*3/uL (ref 0.0–0.1)
Basophils Relative: 1 %
Eosinophils Absolute: 0.7 10*3/uL — ABNORMAL HIGH (ref 0.0–0.5)
Eosinophils Relative: 15 %
HCT: 27.6 % — ABNORMAL LOW (ref 39.0–52.0)
Hemoglobin: 8.6 g/dL — ABNORMAL LOW (ref 13.0–17.0)
Immature Granulocytes: 0 %
Lymphocytes Relative: 24 %
Lymphs Abs: 1.1 10*3/uL (ref 0.7–4.0)
MCH: 33.7 pg (ref 26.0–34.0)
MCHC: 31.2 g/dL (ref 30.0–36.0)
MCV: 108.2 fL — ABNORMAL HIGH (ref 80.0–100.0)
Monocytes Absolute: 0.5 10*3/uL (ref 0.1–1.0)
Monocytes Relative: 11 %
Neutro Abs: 2.3 10*3/uL (ref 1.7–7.7)
Neutrophils Relative %: 49 %
Platelets: 159 10*3/uL (ref 150–400)
RBC: 2.55 MIL/uL — ABNORMAL LOW (ref 4.22–5.81)
RDW: 17.2 % — ABNORMAL HIGH (ref 11.5–15.5)
WBC: 4.5 10*3/uL (ref 4.0–10.5)
nRBC: 0 % (ref 0.0–0.2)

## 2022-11-19 NOTE — ED Triage Notes (Addendum)
Pt arrives via EMS from home with reports of left hip that has worsened over the past month. Pt had full HD tx today.  Denies any injury.

## 2022-11-19 NOTE — ED Provider Triage Note (Signed)
Emergency Medicine Provider Triage Evaluation Note  Timothy Ponce , a 66 y.o. male  was evaluated in triage.  Pt complains of L hip pain intermittent since month but not able to walk since last night. Feels "dislocated" but denies fall or trauma. No previous surgery. No weakness, numbness, tingling, fever, bowel or bladder incontinence. Does not make urine. No back or abdominal pain  Review of Systems  Positive: L hip pain Negative: Fever, chest pain, SOB  Physical Exam  BP 124/60 (BP Location: Left Arm)   Pulse 67   Temp 98.3 F (36.8 C)   Resp 16   Ht '5\' 8"'$  (1.727 m)   Wt 60 kg   SpO2 100%   BMI 20.11 kg/m  Gen:   Awake, no distress   Resp:  Normal effort  MSK:   Moves extremities without difficulty  Other:  Pain with ROM L hip Intact DP and PT pulses  Medical Decision Making  Medically screening exam initiated at 4:21 PM.  Appropriate orders placed.  Timothy Ponce was informed that the remainder of the evaluation will be completed by another provider, this initial triage assessment does not replace that evaluation, and the importance of remaining in the ED until their evaluation is complete.  L hip pain. ESRD   Timothy Essex, MD 11/19/22 1626

## 2022-11-20 DIAGNOSIS — M25552 Pain in left hip: Secondary | ICD-10-CM | POA: Insufficient documentation

## 2022-11-20 MED ORDER — PREDNISONE 10 MG PO TABS: 20.0000 mg | ORAL_TABLET | Freq: Every day | ORAL | 0 refills | Status: DC

## 2022-11-20 MED ORDER — PREDNISONE 20 MG PO TABS
60.0000 mg | ORAL_TABLET | Freq: Once | ORAL | Status: AC
  Administered 2022-11-20: 60 mg via ORAL
  Filled 2022-11-20: qty 3

## 2022-11-20 MED ORDER — OXYCODONE-ACETAMINOPHEN 5-325 MG PO TABS: 1.0000 | ORAL_TABLET | Freq: Four times a day (QID) | ORAL | 0 refills | Status: DC | PRN

## 2022-11-20 MED ORDER — OXYCODONE-ACETAMINOPHEN 5-325 MG PO TABS
1.0000 | ORAL_TABLET | Freq: Once | ORAL | Status: AC
  Administered 2022-11-20: 1 via ORAL
  Filled 2022-11-20: qty 1

## 2022-11-20 NOTE — ED Provider Notes (Signed)
[facility] EMERGENCY DEPARTMENT Provider Note   CSN: [id number] Arrival date & time: 11/19/22  1446     History  Chief Complaint  Patient presents with   Hip Pain    Timothy Ponce is a 66 y.o. male.  Patient has a history of dialysis.  He complains of left hip pain.  No history of injury  The history is provided by the patient and medical records. No language interpreter was used.  Hip Pain This is a new problem. The problem occurs constantly. The problem has not changed since onset.Pertinent negatives include no chest pain, no abdominal pain and no headaches. Nothing aggravates the symptoms. Nothing relieves the symptoms. He has tried nothing for the symptoms. The treatment provided no relief.       Home Medications Prior to Admission medications   Medication Sig Start Date End Date Taking? Authorizing Provider  oxyCODONE-acetaminophen (PERCOCET) 5-325 MG tablet Take 1 tablet by mouth every 6 (six) hours as needed. 11/20/22  Yes Milton Ferguson, MD  predniSONE (DELTASONE) 10 MG tablet Take 2 tablets (20 mg total) by mouth daily. 11/20/22  Yes Milton Ferguson, MD  acetaminophen (TYLENOL) 650 MG CR tablet Take 650 mg by mouth every 8 (eight) hours as needed (foot pain).    [provider]  bimatoprost (LUMIGAN) 0.01 % SOLN Place 1 drop into both eyes at bedtime.    [provider]  brimonidine (ALPHAGAN) 0.2 % ophthalmic solution Place 1 drop into both eyes in the morning and at bedtime. 07/01/21   [provider]  cinacalcet (SENSIPAR) 30 MG tablet Take 30 mg by mouth daily with supper. 07/20/20   [provider]  dorzolamide-timolol (COSOPT) 22.3-6.8 MG/ML ophthalmic solution Place 1 drop into both eyes 2 (two) times daily.  02/05/20   [provider]  HYDROcodone-acetaminophen (NORCO) 5-325 MG tablet Take 1 tablet by mouth every 6 (six) hours as needed for moderate pain. 10/28/22   Dagoberto Ligas, PA-C  ibuprofen  (ADVIL) 200 MG tablet Take 200-400 mg by mouth every 6 (six) hours as needed for moderate pain.    [provider]  multivitamin (RENA-VIT) TABS tablet Take 1 tablet by mouth daily. 10/25/14   [provider]  Netarsudil Dimesylate (RHOPRESSA) 0.02 % SOLN Place 1 drop into both eyes at bedtime.    [provider]  sevelamer carbonate (RENVELA) 800 MG tablet Take 1,600-4,000 mg by mouth See admin instructions. Take 5 tablets (4000 mg) by mouth 2 -3 times daily with meals, take 2 tablets (1600 mg) with snacks 01/14/20   [provider]      Allergies    Iron dextran, Darvon [propoxyphene], Lactose, Lactose intolerance (gi), and Milk (cow)    Review of Systems   Review of Systems  Constitutional:  Negative for appetite change and fatigue.  HENT:  Negative for congestion, ear discharge and sinus pressure.   Eyes:  Negative for discharge.  Respiratory:  Negative for cough.   Cardiovascular:  Negative for chest pain.  Gastrointestinal:  Negative for abdominal pain and diarrhea.  Genitourinary:  Negative for frequency and hematuria.  Musculoskeletal:  Negative for back pain.       Mild pain in left hip  Skin:  Negative for rash.  Neurological:  Negative for seizures and headaches.  Psychiatric/Behavioral:  Negative for hallucinations.     Physical Exam Updated Vital Signs BP 135/75 (BP Location: Left Arm)   Pulse 71   Temp 98 F (36.7 C) (Oral)  Resp 17   Ht '5\' 8"'$  (1.727 m)   Wt 60 kg   SpO2 98%   BMI 20.11 kg/m  Physical Exam Vitals and nursing note reviewed.  Constitutional:      Appearance: He is well-developed.  HENT:     Head: Normocephalic.     Nose: Nose normal.  Eyes:     General: No scleral icterus.    Conjunctiva/sclera: Conjunctivae normal.  Neck:     Thyroid: No thyromegaly.  Cardiovascular:     Rate and Rhythm: Normal rate and regular rhythm.     Heart sounds: No murmur heard.    No friction rub. No gallop.  Pulmonary:      Breath sounds: No stridor. No wheezing or rales.  Chest:     Chest wall: No tenderness.  Abdominal:     General: There is no distension.     Tenderness: There is no abdominal tenderness. There is no rebound.  Musculoskeletal:        General: Normal range of motion.     Cervical back: Neck supple.     Comments: Mild tenderness left hip  Lymphadenopathy:     Cervical: No cervical adenopathy.  Skin:    Findings: No erythema or rash.  Neurological:     Mental Status: He is alert and oriented to person, place, and time.     Motor: No abnormal muscle tone.     Coordination: Coordination normal.  Psychiatric:        Behavior: Behavior normal.     ED Results / Procedures / Treatments   Labs (all labs ordered are listed, but only abnormal results are displayed) Labs Reviewed  CBC WITH DIFFERENTIAL/PLATELET - Abnormal; Notable for the following components:      Result Value   RBC 2.55 (*)    Hemoglobin 8.6 (*)    HCT 27.6 (*)    MCV 108.2 (*)    RDW 17.2 (*)    Eosinophils Absolute 0.7 (*)    All other components within normal limits  BASIC METABOLIC PANEL - Abnormal; Notable for the following components:   Chloride 92 (*)    CO2 35 (*)    Glucose, Bld 123 (*)    Creatinine, Ser 6.84 (*)    Calcium 8.5 (*)    GFR, Estimated 8 (*)    All other components within normal limits    EKG None  Radiology DG Hip Unilat W or Wo Pelvis 2-3 Views Left  Result Date: 11/19/2022 CLINICAL DATA:  Hip pain EXAM: DG HIP (WITH OR WITHOUT PELVIS) 2-3V LEFT COMPARISON:  None Available. FINDINGS: There is no evidence of hip fracture or dislocation. There is no evidence of arthropathy or other focal bone abnormality. Peripheral vascular calcifications are present. IMPRESSION: 1. No acute osseous abnormality. Electronically Signed   By: Ronney Asters M.D.   On: 11/19/2022 16:55    Procedures Procedures    Medications Ordered in ED Medications  predniSONE (DELTASONE) tablet 60 mg (has no  administration in time range)  oxyCODONE-acetaminophen (PERCOCET/ROXICET) 5-325 MG per tablet 1 tablet (has no administration in time range)    ED Course/ Medical Decision Making/ A&P                           Medical Decision Making Risk Prescription drug management.  This patient presents to the ED for concern of left hip pain, this involves an extensive number of treatment options, and is a complaint  that carries with it a high risk of complications and morbidity.  The differential diagnosis includes fractured hip, musculoskeletal pain   Co morbidities that complicate the patient evaluation  Dialysis patient   Additional history obtained:  Additional history obtained from patient External records from outside source obtained and reviewed including hospital records   Lab Tests:  I Ordered, and personally interpreted labs.  The pertinent results include: Hemoglobin 8.6, creatinine 6.8   Imaging Studies ordered:  I ordered imaging studies including x-ray left hip I independently visualized and interpreted imaging which showed negative I agree with the radiologist interpretation   Cardiac Monitoring: / EKG:  The patient was maintained on a cardiac monitor.  I personally viewed and interpreted the cardiac monitored which showed an underlying rhythm of: Unremarkable   Consultations Obtained: No consultant  Problem List / ED Course / Critical interventions / Medication management  Dialysis patient and inflamed left hip I ordered medication including prednisone and Percocet for pain Reevaluation of the patient after these medicines showed that the patient improved I have reviewed the patients home medicines and have made adjustments as needed   Social Determinants of Health:  None   Test / Admission - Considered:  None  Patient with inflammation of the left hip.  X-rays unremarkable.  He is placed on prednisone and Percocet and will follow-up with  Ortho        Final Clinical Impression(s) / ED Diagnoses Final diagnoses:  Left hip pain    Rx / DC Orders ED Discharge Orders          Ordered    predniSONE (DELTASONE) 10 MG tablet  Daily        11/20/22 0923    oxyCODONE-acetaminophen (PERCOCET) 5-325 MG tablet  Every 6 hours PRN        11/20/22 2549              Milton Ferguson, MD 11/24/22 [phone]

## 2022-11-20 NOTE — Discharge Instructions (Signed)
Use a walker to ambulate with.  Rest is much as possible.  Follow-up with the orthopedic doctors as planned next week

## 2022-11-20 NOTE — ED Notes (Signed)
Patient rounded on. Patient complaining of hip pain. Shoes taken off and patient assisted on right side

## 2022-11-20 NOTE — ED Notes (Signed)
[facility] called, third in line.

## 2022-11-27 ENCOUNTER — Ambulatory Visit: Payer: Medicare PPO | Admitting: Podiatry

## 2022-11-28 NOTE — Progress Notes (Signed)
 Initial neurology clinic note  SERVICE DATE: 12/06/22  Reason for Evaluation: Consultation requested by Collins, Emma M, PA-C for an opinion regarding bilateral hand numbness and weakness. My final recommendations will be communicated back to the requesting physician by way of shared medical record or letter to requesting physician via US mail.  HPI: This is Mr. Timothy Ponce, a 66 y.o. right-handed male with a medical history of ESRD on dialysis, legally blind, glaucoma, cataracts, and recently fractured left hip with lytic lesions concerning for multiple myeloma who presents to neurology clinic with the chief complaint of bilateral hand numbness and weakness. The patient is accompanied by mother.  Fistula was originally in left arm in 1991 and moved to right arm in 2005-2006. He is having difficulty moving both hands. Patient notes he had no issues prior to fistula in right arm. Both hands have difficulty, but the right is progressing much faster. He has difficulty moving fingers at all. He occasionally has discomfort in his right hand. 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 He had another aneurysm and surgery one year prior.   He mentions that after dialysis his arm is wrapped tightly to stop bleeding. He would notice more symptoms during this time his wrapped. He also mentions a clamp that is put on his artery after dialysis. This is on for 20-25 minutes.  He had therapy a couple of years ago and does home exercises still daily. Despite this, his hands continue to get worse. About 1-2 years ago, patient mentioned symptoms. Had NCV/EMG/US at [facility] on 09/13/21. Don't have the results but per ortho note from 09/24/21, it showed right median mononeuropathy (no sensory or motor response), and ulnar at elbow on right. Per patient, he states his nerves were mainly checked on the left, and only the wrist on the right, so he disputes the interpretation.  Enlargement of nerves was also seen on NMUS. He went to therapy per note. 01/23/22 note indicates that patient declined injections for trigger fingers. Patient mentions that he has been told he has cysts in the palm and left wrist.  Patient had a CT left hip on 12/04/22. He had this because he had a lot of pain and not being able to put weight on his left leg. CT showed acute nondisplaced pathologic fracture of the left femoral neck through underlying lytic lesions, highly concerning for metastatic disease or multiple myeloma. Patient will be getting lab work soon.   He does not report any constitutional symptoms like fever, night sweats, anorexia or unintentional weight loss.  EtOH use: None  Restrictive diet? None   MEDICATIONS:  Outpatient Encounter Medications as of 12/06/2022  Medication Sig   acetaminophen (TYLENOL) 650 MG CR tablet Take 650 mg by mouth every 8 (eight) hours as needed (foot pain).   bimatoprost (LUMIGAN) 0.01 % SOLN Place 1 drop into both eyes at bedtime.   brimonidine (ALPHAGAN) 0.2 % ophthalmic solution Place 1 drop into both eyes in the morning and at bedtime.   cinacalcet (SENSIPAR) 30 MG tablet Take 30 mg by mouth daily with supper.   dorzolamide-timolol (COSOPT) 22.3-6.8 MG/ML ophthalmic solution Place 1 drop into both eyes 2 (two) times daily.    ibuprofen (ADVIL) 200 MG tablet Take 200-400 mg by mouth every 6 (six) hours as needed for moderate pain.   multivitamin (RENA-VIT) TABS tablet Take 1 tablet by mouth daily.   Netarsudil Dimesylate (RHOPRESSA) 0.02 % SOLN Place 1   drop into both eyes at bedtime.   oxyCODONE-acetaminophen (PERCOCET) 5-325 MG tablet Take 1 tablet by mouth every 6 (six) hours as needed.   predniSONE (DELTASONE) 10 MG tablet Take 2 tablets (20 mg total) by mouth daily.   sevelamer carbonate (RENVELA) 800 MG tablet Take 1,600-4,000 mg by mouth See admin instructions. Take 5 tablets (4000 mg) by mouth 2 -3 times daily with meals, take 2 tablets  (1600 mg) with snacks   [DISCONTINUED] HYDROcodone-acetaminophen (NORCO) 5-325 MG tablet Take 1 tablet by mouth every 6 (six) hours as needed for moderate pain.   No facility-administered encounter medications on file as of 12/06/2022.    PAST MEDICAL HISTORY: Past Medical History:  Diagnosis Date   Cataract    Complication of anesthesia    "Awakens during surgery"   Dyspnea    ESRD (end stage renal disease) ([place] )    TTHSAT, [address]    Glaucoma    Neuropathy    hands - per patient    PAST SURGICAL HISTORY: Past Surgical History:  Procedure Laterality Date   AMPUTATION Right 06/02/2020   Procedure: RIGHT FOOT 5TH RAY AMPUTATION;  Surgeon: Newt Minion, MD;  Location: [facility];  Service: Orthopedics;  Laterality: Right;   AV FISTULA PLACEMENT     EYE SURGERY     cataract bilateral   EYE SURGERY Right    FISTULA SUPERFICIALIZATION Right 10/28/2022   Procedure: RIGHT ARM ARTERIOVENOUS FISTULA REVISION WITH COMPLEX SKIN AND SOFT TISSUE CLOSURE;  Surgeon: Broadus John, MD;  Location: [facility];  Service: Vascular;  Laterality: Right;   INSERTION OF DIALYSIS CATHETER Right 12/08/2020   Procedure: INSERTION OF DIALYSIS CATHETER USING 19cm PALINDROME CHRONIC CATHETER;  Surgeon: Waynetta Sandy, MD;  Location: [facility];  Service: Vascular;  Laterality: Right;   INSERTION OF DIALYSIS CATHETER Right 10/28/2022   Procedure: INSERTION OF RIGHT INTERNAL JUGULAR TUNNELED DIALYSIS CATHETER;  Surgeon: Broadus John, MD;  Location: [facility];  Service: Vascular;  Laterality: Right;   REVISON OF ARTERIOVENOUS FISTULA Right 12/06/2020   Procedure: REVISON OF RIGHT ARM FISTULA;  Surgeon: Rosetta Posner, MD;  Location: [facility];  Service: Vascular;  Laterality: Right;    ALLERGIES: Allergies  Allergen Reactions   Iron Dextran Shortness Of Breath    Infed Iron Infusion    Darvon [Propoxyphene] Other (See Comments)    Lungs filling up with fluid (reaction to Darvon/Darvocet)     Lactose  Other (See Comments)    Gi upset/ sugar in milk   Lactose Intolerance (Gi) Other (See Comments)    Gi upset/ sugar in milk   Milk (Cow) Other (See Comments)    Gi upset    FAMILY HISTORY: History reviewed. No pertinent family history.  SOCIAL HISTORY: Social History   Tobacco Use   Smoking status: Never    Passive exposure: Never   Smokeless tobacco: Never  Vaping Use   Vaping Use: Never used  Substance Use Topics   Alcohol use: Never   Drug use: Never   Social History   Social History Narrative   Are you right handed or left handed? Right handed    Are you currently employed ? no   What is your current occupation?   Do you live at home alone? no   Who lives with you? Lives with mother   What type of home do you live in: 1 story or 2 story?          OBJECTIVE: PHYSICAL EXAM:  BP (!) 160/74   Pulse (!) 54   Ht 5' 8" (1.727 m)   Wt 132 lb (59.9 kg)   SpO2 100%   BMI 20.07 kg/m   General: General appearance: Awake and alert. No distress. Cooperative with exam.  Lungs: Non-labored breathing on room air  Extremities: Right pinkie toe amputated. Bilateral fingers are tight with restricted range of motion. Extensive fistula on right; less extensive surgical changes on left. Musculoskeletal: No obvious joint swelling. Psych: Affect appropriate.  Neurological: Mental Status: Alert. Speech fluent. No pseudobulbar affect Cranial Nerves: CNII: No RAPD. Poor vision. CNIII, IV, VI: Pupils equal, sluggish. No nystagmus. EOMI. CN V: Facial sensation intact bilaterally to fine touch. CN VII: Facial muscles symmetric and strong. No ptosis at rest. CN VIII: Hearing grossly intact bilaterally. CN IX: No hypophonia. CN X: Palate elevates symmetrically. CN XI: Full strength shoulder shrug bilaterally. CN XII: Tongue protrusion full and midline. No atrophy or fasciculations. No significant dysarthria Motor: Tone is normal. Atrophy of left APB.  Individual muscle group  testing (MRC grade out of 5):  Movement     Neck flexion 5    Neck extension 5     Right Left   Shoulder abduction 5 5   Elbow flexion 5 5   Elbow extension 5 5   Wrist extension 5 5   Wrist flexion 5 5   Finger abduction - FDI 3 3   Finger abduction - ADM 3 3   Finger extension 3 3   Finger distal flexion - 2/3 4- 4-   Finger distal flexion - 4/5 4- 4-   Thumb flexion - FPL 4- 4-   Thumb abduction - APB 3 3    Hip flexion 5 Not tested due to fracture   Knee extension 5    Knee flexion 5    Dorsiflexion 5    Plantarflexion 5      Reflexes:  Right Left   Bicep 2+ 2+   Tricep 2+ 2+   BrRad 2+ 2+   Knee 2+ 2+   Ankle 1+ 1+    Sensation: Pinprick: Intact in all extremities Gait: Not assessed as patient has left hip fracture and in wheel chair  Lab and Test Review: Internal labs: 11/19/22: BMP significant for elevated glucose (123), Cr (6.84) CBC significant for anemia (Hb 8.6 - chronic), 159 platelets  B12 (05/31/20): 1672 A1c (05/31/20): 4.9 HIV (05/30/20): non-reactive  CT hip left wo contrast (12/04/22): FINDINGS: Bones/Joint/Cartilage   There is an acute nondisplaced pathologic fracture of the left femoral neck, through multiple lytic lesions with adjacent cortical thinning. There is a lytic lesion in the greater trochanter with adjacent cortical thinning. 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 There is a small joint effusion.   There is a small nondisplaced fracture of the left parasymphyseal pubic bone. Tiny chip fracture of the posterior acetabulum   Ligaments   Suboptimally assessed by CT.   Muscles and Tendons   There is a heterogeneous collection in the region of the iliopsoas bursa measuring up to 2.9 x 2.2 x 6.9 cm with some internal fat attenuation.   Soft tissues   Mild  soft tissue swelling along the femoral neck.   IMPRESSION: Acute nondisplaced pathologic fracture of the left femoral neck through underlying lytic lesions, highly concerning for metastatic disease or multiple myeloma. Recommend malignancy workup including SPEP/UPEP.  Additional lytic lesion in the greater trochanter and periarticular subchondral bone, some of which are potentially subchondral cysts in the setting of osteoarthritis, but indeterminate with other lesions present.   Heterogeneous fluid collection with internal fat attenuation along the iliopsoas, compatible with iliopsoas lipohemobursitis.   Nondisplaced fracture of the left parasymphyseal pubic bone and tiny chip fracture of the posterior acetabulum.  ASSESSMENT: Timothy Ponce is a 66 y.o. male who presents for evaluation of bilateral hand weakness and numbness. He has a relevant medical history of ESRD on dialysis, legally blind, glaucoma, cataracts, and recently fractured left hip with lytic lesions concerning for multiple myeloma. His neurological examination is pertinent for bilateral hand weakness. Previous EMG suggested a right median and ulnar neuropathy per ortho notes, but patient is adamant that extensive testing was done on his left side not the right due to inability to safely test the right due to fistula. The etiology of patient's symptoms is unclear, but there does seem to be distal weakness in multiple nerve distributions including the median, ulnar, and radial branches. 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 Monoclonal gammopathy causing a neuropathy is possible but his symptoms far predate this, so is not likely related. We discussed work up including repeating EMG with the caveat that his extensive surgeries and deformities in the RUE will make testing limited and difficult. Patient decided that he would like to focus on malignancy work up and hip fracture for now, perhaps revisiting  further work up for hands in a few months, which seems very reasonable.  PLAN: -Blood work: Will need multiple myeloma work up - patient states he will be getting this in dialysis through nephrology in the coming days. -Discussed EMG, will defer for now until malignancy work up and left hip is better  -Return to clinic in 3 months  The impression above as well as the plan as outlined below were extensively discussed with the patient (in the company of mother) who voiced understanding. All questions were answered to their satisfaction.  The patient was counseled on pertinent fall precautions per the printed material provided today, and as noted under the "Patient Instructions" section below.  When available, results of the above investigations and possible further recommendations will be communicated to the patient via telephone/MyChart. Patient to call office if not contacted after expected testing turnaround time.   Total time spent reviewing records, interview, history/exam, documentation, and coordination of care on day of encounter:  70 min   Thank you for allowing me to participate in patient's care.  If I can answer any additional questions, I would be pleased to do so.  Jeremy Hill, MD   CC: [facility] [address] [place] [address]  CC: Referring provider: Collins, Emma M, PA-C [address] [place],  [place] [address]  

## 2022-12-02 ENCOUNTER — Other Ambulatory Visit: Payer: Self-pay | Admitting: Physician Assistant

## 2022-12-02 DIAGNOSIS — M1612 Unilateral primary osteoarthritis, left hip: Secondary | ICD-10-CM

## 2022-12-04 ENCOUNTER — Ambulatory Visit (HOSPITAL_COMMUNITY)
Admission: RE | Admit: 2022-12-04 | Discharge: 2022-12-04 | Disposition: A | Discharge status: Home / Self Care | Payer: Medicare PPO | Source: Ambulatory Visit | Attending: Physician Assistant | Admitting: Physician Assistant

## 2022-12-04 DIAGNOSIS — M1612 Unilateral primary osteoarthritis, left hip: Secondary | ICD-10-CM | POA: Insufficient documentation

## 2022-12-06 ENCOUNTER — Ambulatory Visit: Payer: Medicare PPO | Admitting: Neurology

## 2022-12-06 ENCOUNTER — Encounter: Payer: Self-pay | Admitting: Neurology

## 2022-12-06 VITALS — BP 160/74 | HR 54 | Ht 68.0 in | Wt 132.0 lb

## 2022-12-06 DIAGNOSIS — R29898 Other symptoms and signs involving the musculoskeletal system: Secondary | ICD-10-CM

## 2022-12-06 DIAGNOSIS — M79642 Pain in left hand: Secondary | ICD-10-CM

## 2022-12-06 DIAGNOSIS — M79641 Pain in right hand: Secondary | ICD-10-CM

## 2022-12-06 NOTE — Patient Instructions (Signed)
We discussed repeating your EMG today to look for nerve problems in your hands. Given your broken hip and concern for cancer though, you decided to defer until further work up on your hip and blood is done.  I would like to follow up in 3 months to re discuss. Call me sooner if you have new or worsening symptoms.  I do not think it is wise to put pressure on your left leg currently. I encourage you to contact ortho about how to take care of this fracture.  Make sure you get the blood work and work up for the potential blood cancer.  The physicians and staff at [facility] are committed to providing excellent care. You may receive a survey requesting feedback about your experience at our office. We strive to receive "very good" responses to the survey questions. If you feel that your experience would prevent you from giving the office a "very good " response, please contact our office to try to remedy the situation. We may be reached at [phone]. Thank you for taking the time out of your busy day to complete the survey.  Kai Levins, MD [facility]  Preventing Falls at [facility] are common, often dreaded events in the lives of older people. Aside from the obvious injuries and even death that may result, fall can cause wide-ranging consequences including loss of independence, mental decline, decreased activity and mobility. Younger people are also at risk of falling, especially those with chronic illnesses and fatigue.  Ways to reduce risk for falling Examine diet and medications. Warm foods and alcohol dilate blood vessels, which can lead to dizziness when standing. Sleep aids, antidepressants and pain medications can also increase the likelihood of a fall.  Get a vision exam. Poor vision, cataracts and glaucoma increase the chances of falling.  Check foot gear. Shoes should fit snugly and have a sturdy, nonskid sole and a broad, low heel  Participate in a physician-approved  exercise program to build and maintain muscle strength and improve balance and coordination. Programs that use ankle weights or stretch bands are excellent for muscle-strengthening. Water aerobics programs and low-impact Tai Chi programs have also been shown to improve balance and coordination.  Increase vitamin D intake. Vitamin D improves muscle strength and increases the amount of calcium the body is able to absorb and deposit in bones.  How to prevent falls from common hazards Floors - Remove all loose wires, cords, and throw rugs. Minimize clutter. Make sure rugs are anchored and smooth. Keep furniture in its usual place.  Chairs -- Use chairs with straight backs, armrests and firm seats. Add firm cushions to existing pieces to add height.  Bathroom - Install grab bars and non-skid tape in the tub or shower. Use a bathtub transfer bench or a shower chair with a back support Use an elevated toilet seat and/or safety rails to assist standing from a low surface. Do not use towel racks or bathroom tissue holders to help you stand.  Lighting - Make sure halls, stairways, and entrances are well-lit. Install a night light in your bathroom or hallway. Make sure there is a light switch at the top and bottom of the staircase. Turn lights on if you get up in the middle of the night. Make sure lamps or light switches are within reach of the bed if you have to get up during the night.  Kitchen - Install non-skid rubber mats near the sink and stove. Clean spills immediately. Store  frequently used utensils, pots, pans between waist and eye level. This helps prevent reaching and bending. Sit when getting things out of lower cupboards.  Living room/ Bedrooms - Place furniture with wide spaces in between, giving enough room to move around. Establish a route through the living room that gives you something to hold onto as you walk.  Stairs - Make sure treads, rails, and rugs are secure. Install a rail on both  sides of the stairs. If stairs are a threat, it might be helpful to arrange most of your activities on the lower level to reduce the number of times you must climb the stairs.  Entrances and doorways - Install metal handles on the walls adjacent to the doorknobs of all doors to make it more secure as you travel through the doorway.  Tips for maintaining balance Keep at least one hand free at all times. Try using a backpack or fanny pack to hold things rather than carrying them in your hands. Never carry objects in both hands when walking as this interferes with keeping your balance.  Attempt to swing both arms from front to back while walking. This might require a conscious effort if Parkinson's disease has diminished your movement. It will, however, help you to maintain balance and posture, and reduce fatigue.  Consciously lift your feet off of the ground when walking. Shuffling and dragging of the feet is a common culprit in losing your balance.  When trying to navigate turns, use a "U" technique of facing forward and making a wide turn, rather than pivoting sharply.  Try to stand with your feet shoulder-length apart. When your feet are close together for any length of time, you increase your risk of losing your balance and falling.  Do one thing at a time. Don't try to walk and accomplish another task, such as reading or looking around. The decrease in your automatic reflexes complicates motor function, so the less distraction, the better.  Do not wear rubber or gripping soled shoes, they might "catch" on the floor and cause tripping.  Move slowly when changing positions. Use deliberate, concentrated movements and, if needed, use a grab bar or walking aid. Count 15 seconds between each movement. For example, when rising from a seated position, wait 15 seconds after standing to begin walking.  If balance is a continuous problem, you might want to consider a walking aid such as a cane, walking  stick, or walker. Once you've mastered walking with help, you might be ready to try it on your own again.

## 2022-12-08 ENCOUNTER — Encounter (HOSPITAL_COMMUNITY): Payer: Self-pay

## 2022-12-08 ENCOUNTER — Inpatient Hospital Stay (HOSPITAL_COMMUNITY): Payer: Medicare PPO

## 2022-12-08 ENCOUNTER — Inpatient Hospital Stay (HOSPITAL_COMMUNITY)
Admission: EM | Admit: 2022-12-08 | Discharge: 2022-12-10 | DRG: 542 | Disposition: A | Discharge status: Other Acute Inpatient Hospital | Payer: Medicare PPO | Attending: Internal Medicine | Admitting: Internal Medicine

## 2022-12-08 DIAGNOSIS — H269 Unspecified cataract: Secondary | ICD-10-CM | POA: Diagnosis present

## 2022-12-08 DIAGNOSIS — M25552 Pain in left hip: Secondary | ICD-10-CM | POA: Diagnosis present

## 2022-12-08 DIAGNOSIS — Z992 Dependence on renal dialysis: Secondary | ICD-10-CM | POA: Diagnosis not present

## 2022-12-08 DIAGNOSIS — N186 End stage renal disease: Secondary | ICD-10-CM | POA: Diagnosis not present

## 2022-12-08 DIAGNOSIS — M898X9 Other specified disorders of bone, unspecified site: Secondary | ICD-10-CM | POA: Diagnosis present

## 2022-12-08 DIAGNOSIS — M899 Disorder of bone, unspecified: Secondary | ICD-10-CM | POA: Diagnosis not present

## 2022-12-08 DIAGNOSIS — G629 Polyneuropathy, unspecified: Secondary | ICD-10-CM | POA: Diagnosis present

## 2022-12-08 DIAGNOSIS — Z885 Allergy status to narcotic agent status: Secondary | ICD-10-CM | POA: Diagnosis not present

## 2022-12-08 DIAGNOSIS — D631 Anemia in chronic kidney disease: Secondary | ICD-10-CM | POA: Diagnosis present

## 2022-12-08 DIAGNOSIS — I739 Peripheral vascular disease, unspecified: Secondary | ICD-10-CM | POA: Diagnosis present

## 2022-12-08 DIAGNOSIS — N2581 Secondary hyperparathyroidism of renal origin: Secondary | ICD-10-CM | POA: Diagnosis present

## 2022-12-08 DIAGNOSIS — M84552A Pathological fracture in neoplastic disease, left femur, initial encounter for fracture: Principal | ICD-10-CM | POA: Diagnosis present

## 2022-12-08 DIAGNOSIS — C9000 Multiple myeloma not having achieved remission: Secondary | ICD-10-CM | POA: Diagnosis present

## 2022-12-08 DIAGNOSIS — E041 Nontoxic single thyroid nodule: Secondary | ICD-10-CM | POA: Insufficient documentation

## 2022-12-08 DIAGNOSIS — S72002A Fracture of unspecified part of neck of left femur, initial encounter for closed fracture: Secondary | ICD-10-CM | POA: Diagnosis present

## 2022-12-08 DIAGNOSIS — H409 Unspecified glaucoma: Secondary | ICD-10-CM | POA: Diagnosis present

## 2022-12-08 DIAGNOSIS — Z91011 Allergy to milk products: Secondary | ICD-10-CM

## 2022-12-08 DIAGNOSIS — Z79899 Other long term (current) drug therapy: Secondary | ICD-10-CM

## 2022-12-08 LAB — CBC WITH DIFFERENTIAL/PLATELET
Abs Immature Granulocytes: 0.16 10*3/uL — ABNORMAL HIGH (ref 0.00–0.07)
Basophils Absolute: 0 10*3/uL (ref 0.0–0.1)
Basophils Relative: 0 %
Eosinophils Absolute: 0 10*3/uL (ref 0.0–0.5)
Eosinophils Relative: 0 %
HCT: 29.7 % — ABNORMAL LOW (ref 39.0–52.0)
Hemoglobin: 9.5 g/dL — ABNORMAL LOW (ref 13.0–17.0)
Immature Granulocytes: 1 %
Lymphocytes Relative: 9 %
Lymphs Abs: 1.2 10*3/uL (ref 0.7–4.0)
MCH: 34.1 pg — ABNORMAL HIGH (ref 26.0–34.0)
MCHC: 32 g/dL (ref 30.0–36.0)
MCV: 106.5 fL — ABNORMAL HIGH (ref 80.0–100.0)
Monocytes Absolute: 1.3 10*3/uL — ABNORMAL HIGH (ref 0.1–1.0)
Monocytes Relative: 10 %
Neutro Abs: 10.1 10*3/uL — ABNORMAL HIGH (ref 1.7–7.7)
Neutrophils Relative %: 80 %
Platelets: 143 10*3/uL — ABNORMAL LOW (ref 150–400)
RBC: 2.79 MIL/uL — ABNORMAL LOW (ref 4.22–5.81)
RDW: 15.2 % (ref 11.5–15.5)
WBC: 12.8 10*3/uL — ABNORMAL HIGH (ref 4.0–10.5)
nRBC: 0 % (ref 0.0–0.2)

## 2022-12-08 LAB — COMPREHENSIVE METABOLIC PANEL
ALT: 19 U/L (ref 0–44)
AST: 22 U/L (ref 15–41)
Albumin: 3.6 g/dL (ref 3.5–5.0)
Alkaline Phosphatase: 99 U/L (ref 38–126)
Anion gap: 14 (ref 5–15)
BUN: 40 mg/dL — ABNORMAL HIGH (ref 8–23)
CO2: 31 mmol/L (ref 22–32)
Calcium: 9.6 mg/dL (ref 8.9–10.3)
Chloride: 94 mmol/L — ABNORMAL LOW (ref 98–111)
Creatinine, Ser: 9.14 mg/dL — ABNORMAL HIGH (ref 0.61–1.24)
GFR, Estimated: 6 mL/min — ABNORMAL LOW (ref >60)
Glucose, Bld: 109 mg/dL — ABNORMAL HIGH (ref 70–99)
Potassium: 5 mmol/L (ref 3.5–5.1)
Sodium: 139 mmol/L (ref 135–145)
Total Bilirubin: 0.9 mg/dL (ref 0.3–1.2)
Total Protein: 7.4 g/dL (ref 6.5–8.1)

## 2022-12-08 MED ORDER — PROCHLORPERAZINE EDISYLATE 10 MG/2ML IJ SOLN
5.0000 mg | Freq: Once | INTRAMUSCULAR | Status: AC
  Administered 2022-12-08: 5 mg via INTRAVENOUS
  Filled 2022-12-08: qty 2

## 2022-12-08 MED ORDER — OXYCODONE-ACETAMINOPHEN 5-325 MG PO TABS
1.0000 | ORAL_TABLET | Freq: Once | ORAL | Status: AC
  Administered 2022-12-08: 1 via ORAL
  Filled 2022-12-08: qty 1

## 2022-12-08 MED ORDER — FENTANYL CITRATE PF 50 MCG/ML IJ SOSY
50.0000 ug | PREFILLED_SYRINGE | Freq: Once | INTRAMUSCULAR | Status: AC
  Administered 2022-12-08: 50 ug via INTRAVENOUS
  Filled 2022-12-08: qty 1

## 2022-12-08 MED ORDER — OXYCODONE HCL 5 MG PO TABS
5.0000 mg | ORAL_TABLET | ORAL | Status: DC | PRN
  Administered 2022-12-09 – 2022-12-10 (×4): 10 mg via ORAL
  Filled 2022-12-08 (×4): qty 2

## 2022-12-08 MED ORDER — HYDROMORPHONE HCL 1 MG/ML IJ SOLN
0.5000 mg | INTRAMUSCULAR | Status: DC | PRN
  Administered 2022-12-08 – 2022-12-09 (×5): 0.5 mg via INTRAVENOUS
  Filled 2022-12-08 (×2): qty 0.5
  Filled 2022-12-08: qty 1
  Filled 2022-12-08 (×2): qty 0.5

## 2022-12-08 NOTE — ED Notes (Signed)
Called receiving nurse to update on pt status as we await IV placement

## 2022-12-08 NOTE — ED Provider Notes (Signed)
[facility] DEPT Provider Note   CSN: [id number] Arrival date & time: 12/08/22  9326     History  Chief Complaint  Patient presents with   Hip Pain    Timothy Ponce is a 66 y.o. male ESRD on dialysis T/T/Sat presenting to the ED with worsening left hip pain.  States this initially started gradually 2 months ago and continued to worsen.  Seen on 11/19/2022 in the ED, negative x-rays.  However pain continued to increase.  Saw orthopedics on the 18th, had an urgent CT scan, and resulted with a likely pathological femoral neck fracture with findings suspicious for multiple myeloma.  Plans to follow-up this Friday for further evaluation for possible malignancy.  Also has planned follow-up this Wednesday with orthopedics, however states the Percocet has been taking has not provided any relief, and has difficulty bearing weight at all.  Lives with his 30-48 year old mother, and having difficulty with ADLs.  States he cannot live in this pain.  Denies fever, chest pain, shortness of breath, or new injury.  No other complaints at this time.  Per record review of nephrology "Had CT L [facility] yesterday, asks me for results: Acute nondisplaced pathologic fracture of the left femoral neck through underlying lytic lesions, highly concerning for metastatic disease or multiple myeloma. Recommend malignancy workup"  The history is provided by the patient and medical records.  Hip Pain     Home Medications Prior to Admission medications   Medication Sig Start Date End Date Taking? Authorizing Provider  acetaminophen (TYLENOL) 325 MG tablet Take 650 mg by mouth 2 (two) times daily as needed for moderate pain.   Yes [provider]  acetaminophen (TYLENOL) 650 MG CR tablet Take 650 mg by mouth 2 (two) times daily as needed (foot pain).   Yes [provider]  bimatoprost (LUMIGAN) 0.01 % SOLN Place 1 drop into both eyes at bedtime.   Yes  [provider]  brimonidine (ALPHAGAN) 0.2 % ophthalmic solution Place 1 drop into both eyes in the morning and at bedtime. 07/01/21  Yes [provider]  cinacalcet (SENSIPAR) 30 MG tablet Take 30 mg by mouth daily with supper. 07/20/20  Yes [provider]  docusate sodium (COLACE) 100 MG capsule Take 100 mg by mouth in the morning, at noon, and at bedtime.   Yes [provider]  dorzolamide-timolol (COSOPT) 22.3-6.8 MG/ML ophthalmic solution Place 1 drop into both eyes 2 (two) times daily.  02/05/20  Yes [provider]  multivitamin (RENA-VIT) TABS tablet Take 1 tablet by mouth daily. 10/25/14  Yes [provider]  Netarsudil Dimesylate (RHOPRESSA) 0.02 % SOLN Place 1 drop into both eyes at bedtime.   Yes [provider]  predniSONE (DELTASONE) 10 MG tablet Take 2 tablets (20 mg total) by mouth daily. Patient taking differently: Take by mouth. Take as directed in dose pack 11/20/22  Yes Milton Ferguson, MD  sevelamer carbonate (RENVELA) 800 MG tablet Take 1,600-4,000 mg by mouth See admin instructions. Take 5 tablets (4000 mg) by mouth 2 -3 times daily with meals, take 2 tablets (1600 mg) with snacks 01/14/20  Yes [provider]  oxyCODONE-acetaminophen (PERCOCET) 5-325 MG tablet Take 1 tablet by mouth every 6 (six) hours as needed. Patient not taking: Reported on 12/08/2022 11/20/22   Milton Ferguson, MD      Allergies    Iron dextran, Darvon [propoxyphene], Lactose, and Milk (cow)    Review of Systems  Review of Systems  Musculoskeletal:  Positive for myalgias.    Physical Exam Updated Vital Signs BP (!) 198/94 (BP Location: Right Arm)   Pulse 69   Temp 98.1 F (36.7 C) (Oral)   Resp 18   Ht _0  (1.727 m)   Wt 59.9 kg   SpO2 100%   BMI 20.07 kg/m  Physical Exam Vitals and nursing note reviewed.  Constitutional:      General: He is not in acute distress.    Appearance: He is well-developed. He is not  ill-appearing, toxic-appearing or diaphoretic.  HENT:     Head: Normocephalic and atraumatic.  Eyes:     Conjunctiva/sclera: Conjunctivae normal.  Cardiovascular:     Rate and Rhythm: Normal rate and regular rhythm.     Pulses: Normal pulses.     Heart sounds: No murmur heard.    Comments: DP and PT pulses 2 + bilaterally Pulmonary:     Effort: Pulmonary effort is normal. No respiratory distress.     Breath sounds: Normal breath sounds.  Chest:     Chest wall: No tenderness.  Abdominal:     Palpations: Abdomen is soft.     Tenderness: There is no abdominal tenderness.  Musculoskeletal:        General: Tenderness present. No swelling.     Cervical back: Neck supple. No rigidity.     Comments: Tenderness over the left femoral neck.  ROM deferred due to known pathological fx from recent CT. Able to wiggle toes.  Hip joint is not erythematous, swollen, or edematous.  Skin:    General: Skin is warm and dry.     Capillary Refill: Capillary refill takes less than 2 seconds.     Coloration: Skin is not jaundiced or pale.  Neurological:     Mental Status: He is alert and oriented to person, place, and time.     Comments: Sensation appears grossly intact of bilateral lower extremities.  No saddle anesthesia.  Psychiatric:        Mood and Affect: Mood normal.    ED Results / Procedures / Treatments   Labs (all labs ordered are listed, but only abnormal results are displayed) Labs Reviewed  CBC WITH DIFFERENTIAL/PLATELET  COMPREHENSIVE METABOLIC PANEL   EKG None  Radiology No results found.  Procedures Procedures    Medications Ordered in ED Medications  fentaNYL (SUBLIMAZE) injection 50 mcg (has no administration in time range)  prochlorperazine (COMPAZINE) injection 5 mg (has no administration in time range)  oxyCODONE (Oxy IR/ROXICODONE) immediate release tablet 5-10 mg (has no administration in time range)  HYDROmorphone (DILAUDID) injection 0.5 mg (has no  administration in time range)  oxyCODONE-acetaminophen (PERCOCET/ROXICET) 5-325 MG per tablet 1 tablet (1 tablet Oral Given 12/08/22 1142)    ED Course/ Medical Decision Making/ A&P Clinical Course as of 12/08/22 1451  Sun Dec 08, 2022  1228 Briefly consulted with [facility] PA Noemi Chapel discussing patient's case and presentation.  She plans to further assess imaging reports and call back with further recommendations. [AC]  1237 [place] called back with recommendations for admission to hospitalist, keep n.p.o., with possible intervention today.  Aware patient is an ESRD on dialysis T/TH/sat with elevated BP at this time.  Recommended obtaining labs and again admitting with medicine. [AC]  6384 Patient last meal 7:30 PM last night, and had a small amount of water to take a tablet of Percocet around 1142 AM. [AC]  1258 Discussed pt with Dr. Olevia Bowens of hospitalist service.  Aware of recommendations by ortho.  Requests call back once labs resulted. [AC]  1434 Discussed with patient's nurse regarding pending labs.  Still with difficulty obtaining IV access and/or blood, IV team was consulted and still pending. [AC]  Grant Dr. Olevia Bowens recontacted this provider through secure messaging.  Plans to admit with pending labs, IV team inaccessible at this time. [AC]    Clinical Course User Index [AC] Prince Rome, PA-C                           Medical Decision Making Amount and/or Complexity of Data Reviewed Labs: ordered.  Risk Prescription drug management. Decision regarding hospitalization.   66 y.o. male presents to the ED for concern of Hip Pain      Past Medical History / Co-morbidities / Social History: Hx of ESRD with dialysis T/TH/sat, anemia of chronic disease, peripheral arterial disease, IDA, coagulation defect, secondary hyperparathyroidism, HTN Social Determinants of Health include: None  Additional History:  Obtained by chart review.  Notably recent nephrology,  orthopedics notes.   Nephrology 12/05/22 "Had CT [facility] hip [facility] yesterday, asks me for results: Acute nondisplaced pathologic fracture of the left femoral neck through underlying lytic lesions, highly concerning for metastatic disease or multiple myeloma. Recommend malignancy workup"  Lab Tests: I ordered, and personally interpreted labs.  The pertinent results include:   Pending  Imaging Studies: None  Cardiac Monitoring: The patient was maintained on a cardiac monitor.  I personally viewed and interpreted the cardiac monitored which showed an underlying rhythm of: NSR  ED Course / Critical Interventions: Pt overall well-appearing on exam.  Presenting with known left femoral neck fracture.  Increasing left hip pain over the last 2 months.  11/19/2022 x-rays negative for fracture.   After further in-depth chart review, appears patient followed up with orthopedics, had stat CT outpatient performed.  Had plan for injection if negative for fracture, however CT did indicate femoral neck fracture likely of pathological origin.  Imaging suspicion for multiple myeloma.  Received his results at a nephrology appointment on 12/05/2022.  Supposed to follow-up this Friday for further evaluation and malignancy workup.  Came to the ED due to the overwhelming pain per patient.  States Percocet has not provided pain relief.  1 additional Percocet provided in triage. Significant tenderness of left hip, near the femoral neck region on exam.  ROM deferred due to known recently diagnosed fracture from outpatient CT imaging.  Lower extremities without evidence of ischemia.  Compartments soft.  Lower extremities appear neurovascularly intact, good blood flow.  Hemodynamically stable with the exception of elevated blood pressure.  Not currently on antihypertensive.  States blood pressures not usually this high, though states he is in significant pain.  No issues with dialysis yesterday.  No evidence of fluid  overload on exam. Consulted with orthopedics, see notes above.  Plan for admission to medicine with consideration of possible intervention today.  Patient n.p.o. at this time.  Fentanyl ordered. Upon reevaluation, patient with mild pain improvement.  Hypertension values remain stable over the last 2 to 3 hours.  Still without chest pain, headache, or shortness of breath. Informed by nursing staff difficulty of obtaining labs.  IV team consulted, however informed they will not be available for quite some time.  Hospitalist reach back out via messaging with same information, recommends admission at this time with labs pending.  Plan to admit.  Disposition: Admission  I discussed this  case with my attending, Dr. Nechama Guard, who agreed with the proposed treatment course and cosigned this note including.  Attending physician stated agreement with plan or made changes to plan which were implemented.     This chart was dictated using voice recognition software.  Despite best efforts to proofread, errors can occur which can change the documentation meaning.         Final Clinical Impression(s) / ED Diagnoses Final diagnoses:  Left hip pain    Rx / DC Orders ED Discharge Orders     None         Prince Rome, Hershal Coria 81/85/63 1451    Elgie Congo, MD 12/09/22 1044

## 2022-12-08 NOTE — ED Notes (Signed)
Tried multiple times to get in touch with IV team but number says not in service. Will continue to monitor

## 2022-12-08 NOTE — ED Notes (Signed)
Report given to Zacarias Pontes RN and [facility] called for transport.

## 2022-12-08 NOTE — ED Notes (Signed)
IV team at pt bedside 

## 2022-12-08 NOTE — ED Triage Notes (Signed)
Pt BIBA from home. Pt c/o chronic left hip pain, since 11/19/22. Pt was dx 12/20 with a left hip fx. 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 Pain has been worsening. Pt radiating to thigh. PMS intact.   Legally blind.

## 2022-12-08 NOTE — ED Notes (Signed)
Contacted phlebotomy again for labs no answer at this time

## 2022-12-08 NOTE — ED Notes (Signed)
Provider at pt bedside  

## 2022-12-08 NOTE — H&P (Addendum)
History and Physical    Patient: Timothy Ponce RRN:[id number] DOB: 27-Feb-1956 DOA: 12/08/2022 DOS: the patient was seen and examined on 12/08/2022 PCP: [facility]  Patient coming from: Home  Chief Complaint:  Chief Complaint  Patient presents with   Hip Pain   HPI: Timothy Ponce is a 66 y.o. male with medical history significant of cataract, glaucoma, anesthesia complications (awakens during surgery), dyspnea, ESRD on hemodialysis TU/TH/SAT with last HD session done yesterday is coming to the emergency department with hip pain.  He went to the emergency department on 11/19/2022 with hip pain but had negative x-rays.  He was discharged home.  He continued to have pain, went to see orthopedic surgery and was diagnosed with a pathological fracture 12/04/2022 after CT left hip without contrast was done.  Imaging findings are suspicious for multiple myeloma.  There is no history of falls or any other trauma.  He denied fever, chills, rhinorrhea, sore throat, wheezing or hemoptysis.  No chest pain, palpitations, diaphoresis, PND, orthopnea or pitting edema of the lower extremities.  No abdominal pain, nausea, emesis, diarrhea, melena or hematochezia.  He gets frequently constipated.  No flank pain and he no longer urinates since he has being on HD.  No polyuria, polydipsia, polyphagia or blurred vision.   ED course: Initial vital signs were temperature 97.7 F, pulse 70, respirations 16, BP 153/84 mmHg O2 sat 100% on room air.  The patient received a tablet of Percocet 5/325 mg p.o.  Lab work: Still not drawn.  Awaiting for the IV team to draw.  Imaging: Portable chest radiograph with no active disease.  CT of the left hip with an acute nondisplaced pathologic fracture of the left femoral neck through underlying lytic lesions, highly concerning for metastatic disease or multiple myeloma.  Recommend malignancy workup including SPEP/UPEP.   Review of Systems: As mentioned in the  history of present illness. All other systems reviewed and are negative. Past Medical History:  Diagnosis Date   Cataract    Complication of anesthesia    "Awakens during surgery"   Dyspnea    ESRD (end stage renal disease) ([place])    TTHSAT, [address]    Glaucoma    Neuropathy    hands - per patient   Past Surgical History:  Procedure Laterality Date   AMPUTATION Right 06/02/2020   Procedure: RIGHT FOOT 5TH RAY AMPUTATION;  Surgeon: Newt Minion, MD;  Location: [facility];  Service: Orthopedics;  Laterality: Right;   AV FISTULA PLACEMENT     EYE SURGERY     cataract bilateral   EYE SURGERY Right    FISTULA SUPERFICIALIZATION Right 10/28/2022   Procedure: RIGHT ARM ARTERIOVENOUS FISTULA REVISION WITH COMPLEX SKIN AND SOFT TISSUE CLOSURE;  Surgeon: Broadus John, MD;  Location: [place];  Service: Vascular;  Laterality: Right;   INSERTION OF DIALYSIS CATHETER Right 12/08/2020   Procedure: INSERTION OF DIALYSIS CATHETER USING 19cm PALINDROME CHRONIC CATHETER;  Surgeon: Waynetta Sandy, MD;  Location: [facility];  Service: Vascular;  Laterality: Right;   INSERTION OF DIALYSIS CATHETER Right 10/28/2022   Procedure: INSERTION OF RIGHT INTERNAL JUGULAR TUNNELED DIALYSIS CATHETER;  Surgeon: Broadus John, MD;  Location: [facility] OR;  Service: Vascular;  Laterality: Right;   REVISON OF ARTERIOVENOUS FISTULA Right 12/06/2020   Procedure: REVISON OF RIGHT ARM FISTULA;  Surgeon: Rosetta Posner, MD;  Location: [facility] OR;  Service: Vascular;  Laterality: Right;   Social History:  reports that he has never  smoked. He has never been exposed to tobacco smoke. He has never used smokeless tobacco. He reports that he does not drink alcohol and does not use drugs.  Allergies  Allergen Reactions   Iron Dextran Shortness Of Breath    Infed Iron Infusion    Darvon [Propoxyphene] Other (See Comments)    Lungs filling up with fluid (reaction to Darvon/Darvocet)     Lactose Other (See Comments)    Gi  upset/ sugar in milk   Milk (Cow) Other (See Comments)    Gi upset    History reviewed. No pertinent family history.  Prior to Admission medications   Medication Sig Start Date End Date Taking? Authorizing Provider  acetaminophen (TYLENOL) 325 MG tablet Take 650 mg by mouth 2 (two) times daily as needed for moderate pain.   Yes [provider]  acetaminophen (TYLENOL) 650 MG CR tablet Take 650 mg by mouth 2 (two) times daily as needed (foot pain).   Yes [provider]  bimatoprost (LUMIGAN) 0.01 % SOLN Place 1 drop into both eyes at bedtime.   Yes [provider]  brimonidine (ALPHAGAN) 0.2 % ophthalmic solution Place 1 drop into both eyes in the morning and at bedtime. 07/01/21  Yes [provider]  cinacalcet (SENSIPAR) 30 MG tablet Take 30 mg by mouth daily with supper. 07/20/20  Yes [provider]  docusate sodium (COLACE) 100 MG capsule Take 100 mg by mouth in the morning, at noon, and at bedtime.   Yes [provider]  dorzolamide-timolol (COSOPT) 22.3-6.8 MG/ML ophthalmic solution Place 1 drop into both eyes 2 (two) times daily.  02/05/20  Yes [provider]  multivitamin (RENA-VIT) TABS tablet Take 1 tablet by mouth daily. 10/25/14  Yes [provider]  Netarsudil Dimesylate (RHOPRESSA) 0.02 % SOLN Place 1 drop into both eyes at bedtime.   Yes [provider]  predniSONE (DELTASONE) 10 MG tablet Take 2 tablets (20 mg total) by mouth daily. Patient taking differently: Take by mouth. Take as directed in dose pack 11/20/22  Yes Milton Ferguson, MD  sevelamer carbonate (RENVELA) 800 MG tablet Take 1,600-4,000 mg by mouth See admin instructions. Take 5 tablets (4000 mg) by mouth 2 -3 times daily with meals, take 2 tablets (1600 mg) with snacks 01/14/20  Yes [provider]  oxyCODONE-acetaminophen (PERCOCET) 5-325 MG tablet Take 1 tablet by mouth every 6 (six) hours as needed. Patient not taking: Reported  on 12/08/2022 11/20/22   Milton Ferguson, MD    Physical Exam: Vitals:   12/08/22 0912 12/08/22 1145 12/08/22 1149 12/08/22 1258  BP: (!) 153/84 (!) 188/101 (!) 202/100 (!) 198/94  Pulse: 70 69  69  Resp: _0 Temp: 97.7 F (36.5 C)   98.1 F (36.7 C)  TempSrc: Oral   Oral  SpO2: 100% 100%  100%  Weight:      Height:       Physical Exam Vitals and nursing note reviewed.  Constitutional:      General: He is awake. He is not in acute distress. HENT:     Head: Normocephalic.     Nose: No rhinorrhea.     Mouth/Throat:     Mouth: Mucous membranes are moist.  Eyes:     General: No scleral icterus.    Pupils: Pupils are equal, round, and reactive to light.  Neck:     Vascular: No JVD.  Cardiovascular:     Rate and Rhythm: Normal rate and regular  rhythm.     Heart sounds: S1 normal and S2 normal.     Comments: RUL fistula with good thrill Pulmonary:     Effort: Pulmonary effort is normal.     Breath sounds: Normal breath sounds.  Abdominal:     General: Bowel sounds are normal. There is no distension.     Palpations: Abdomen is soft.     Tenderness: There is no abdominal tenderness.  Musculoskeletal:     Cervical back: Neck supple.     Left hip: Tenderness present. Decreased range of motion. Decreased strength.     Right lower leg: No edema.     Left lower leg: No edema.  Skin:    General: Skin is warm and dry.  Neurological:     General: No focal deficit present.     Mental Status: He is alert and oriented to person, place, and time.  Psychiatric:        Mood and Affect: Mood normal.        Behavior: Behavior normal. Behavior is cooperative.   Data Reviewed:  Results are pending, will review when available.  Assessment and Plan: Principal Problem:   Closed left hip fracture, initial encounter ([place]) Admit to telemetry/inpatient. Ice area as needed. Buck's traction per protocol. Analgesics as needed. Antiemetics as needed. Consult TOC team. Consult  nutritional services. PT evaluation after surgery. Orthopedic surgery consult requested. Will follow recommendations.  Active Problems:   Lytic bone lesions on imaging No known primary. Suspicious for multiple myeloma. Serum protein electrophoresis ordered. May need inpatient PET scan. Discussed briefly with orthopedic surgery. We will follow their recommendations. Surgery may need to be delayed until this is addressed.    ESRD on hemodialysis ([place]) Continue Tu/Th/Sa HD sessions. Consult nephrology once the patient [facility] to [facility]. Monitor BUN, creatinine and electrolytes.    Anemia of chronic renal failure Monitor hematocrit and hemoglobin. Transfuse as needed. Erythropoietin per nephrology.    Peripheral arterial disease (HCC) No signs of critical limb ischemia.    Secondary hyperparathyroidism of renal origin (HCC) Continue Sensipar 30 mg p.o. daily with supper. Continue Renvela 4000 mg with meals. Continue Renvela 1600 mg with snacks.    Glaucoma  Continue Lumigan, Alphagan, Cosopt and Rhopressa eyedrops. Follow-up with ophthalmology as an outpatient.   Advance Care Planning:   Code Status: Full Code   Consults: Orthopedic surgery Noemi Chapel, PA-C from [facility]).  Family Communication:   Severity of Illness: The appropriate patient status for this patient is INPATIENT. Inpatient status is judged to be reasonable and necessary in order to provide the required intensity of service to ensure the patient's safety. The patient's presenting symptoms, physical exam findings, and initial radiographic and laboratory data in the context of their chronic comorbidities is felt to place them at high risk for further clinical deterioration. Furthermore, it is not anticipated that the patient will be medically stable for discharge from the hospital within 2 midnights of admission.   * I certify that at the point of admission it is my clinical judgment that the patient will  require inpatient hospital care spanning beyond 2 midnights from the point of admission due to high intensity of service, high risk for further deterioration and high frequency of surveillance required.*  Author: Reubin Milan, MD 12/08/2022 3:09 PM  For on call review [email or web address].   This document was prepared using Dragon voice recognition software and may contain some unintended transcription errors.

## 2022-12-08 NOTE — ED Notes (Signed)
Two unsuccessful attempts at peripheral IV placement, IV team called.

## 2022-12-08 NOTE — ED Notes (Signed)
Provider notified of delay in labs

## 2022-12-08 NOTE — ED Notes (Signed)
Contacted main lab phlebotomy for lab draw.

## 2022-12-08 NOTE — ED Notes (Signed)
IV access gained via [place] PIV.

## 2022-12-08 NOTE — ED Notes (Signed)
ED TO INPATIENT HANDOFF REPORT  Name/Age/Gender Timothy Ponce 66 y.o. male  Code Status    Code Status Orders  (From admission, onward)           Start     Ordered   12/08/22 1446  Full code  Continuous       Question:  By:  Answer:  Consent: discussion documented in EHR   12/08/22 1446           Code Status History     Date Active Date Inactive Code Status Order ID Comments User Context   12/08/2022 1446 12/08/2022 1446 Full Code [id number]  Reubin Milan, MD ED   05/30/2020 2254 06/08/2020 0328 Full Code [id number]  Jean Rosenthal, MD ED       Home/SNF/Other Home  Chief Complaint Closed left hip fracture, initial encounter ([place]) [S72.002A]  Level of Care/Admitting Diagnosis ED Disposition     ED Disposition  Admit   Condition  --   [facility] Area: [place] [[id number]]  Level of Care: Telemetry Medical [104]  May admit patient to [facility] or [facility] if equivalent level of care is available:: No  Covid Evaluation: Asymptomatic - no recent exposure (last 10 days) testing not required  Diagnosis: Closed left hip fracture, initial encounter [facility]) [[id number]]  Admitting Physician: Reubin Milan [[id number]]  Attending Physician: Reubin Milan [[id number]]  Certification:: I certify this patient will need inpatient services for at least 2 midnights  Estimated Length of Stay: 2          Medical History Past Medical History:  Diagnosis Date   Cataract    Complication of anesthesia    "Awakens during surgery"   Dyspnea    ESRD (end stage renal disease) ([place])    TTHSAT, [address]    Glaucoma    Neuropathy    hands - per patient    Allergies Allergies  Allergen Reactions   Iron Dextran Shortness Of Breath    Infed Iron Infusion    Darvon [Propoxyphene] Other (See Comments)    Lungs filling up with fluid (reaction to Darvon/Darvocet)     Lactose Other (See Comments)    Gi upset/ sugar in milk    Milk (Cow) Other (See Comments)    Gi upset    IV Location/Drains/Wounds Patient Lines/Drains/Airways Status     Active Line/Drains/Airways     Name Placement date Placement time Site Days   Peripheral IV 10/28/22 20 G Left;Posterior Forearm 10/28/22  1034  Forearm  41   Peripheral IV 12/08/22 22 G 1" Left Hand 12/08/22  1710  Hand  less than 1   Fistula / Graft Right Forearm Arteriovenous fistula --  --  Forearm  --   Hemodialysis Catheter Right Internal jugular Double lumen Permanent (Tunneled) 12/08/20  1434  Internal jugular  730   Hemodialysis Catheter Right Internal jugular Double lumen Permanent (Tunneled) 10/28/22  1321  Internal jugular  41   Incision (Closed) 12/06/20 Arm Right 12/06/20  0916  -- 732   Incision (Closed) 12/08/20 Neck Right 12/08/20  1335  -- 730   Incision (Closed) 10/28/22 Arm Right 10/28/22  1423  -- 41   Incision (Closed) 10/28/22 Neck Right 10/28/22  1423  -- 41            Labs/Imaging No results found for this or any previous visit (from the past 48 hour(s)). Chest Portable 1 View  Result Date: 12/08/2022 CLINICAL DATA:  Preop for hip fracture. EXAM: PORTABLE CHEST 1 VIEW COMPARISON:  10/28/2022. FINDINGS: Cardiac silhouette normal in size configuration. Normal mediastinal and hilar contours. Clear lungs. Right anterior chest wall, internal jugular, tunneled dual lumen central venous catheter is stable. Skeletal structures are grossly intact. IMPRESSION: No active disease. Electronically Signed   By: Lajean Manes M.D.   On: 12/08/2022 15:57    Pending Labs Unresulted Labs (From admission, onward)     Start     Ordered   12/10/22 3710  Basic metabolic panel  Daily,   R      12/08/22 1450   12/09/22 0500  HIV Antibody (routine testing w rflx)  (HIV Antibody (Routine testing w reflex) panel)  Tomorrow morning,   R        12/08/22 1446   12/09/22 0500  CBC  Daily,   R      12/08/22 1450   12/09/22 0500  Comprehensive metabolic panel  Tomorrow  morning,   R        12/08/22 1450   12/08/22 1516  Protein electrophoresis, serum  Once,   R        12/08/22 1515   12/08/22 1237  CBC with Differential  Once,   STAT        12/08/22 1237   12/08/22 1237  Comprehensive metabolic panel  Once,   STAT        12/08/22 1237            Vitals/Pain Today's Vitals   12/08/22 1149 12/08/22 1258 12/08/22 1839 12/08/22 1839  BP: (!) 202/100 (!) 198/94    Pulse:  69    Resp:  18    Temp:  98.1 F (36.7 C) 98.6 F (37 C)   TempSrc:  Oral Oral   SpO2:  100%    Weight:      Height:      PainSc:    7     Isolation Precautions No active isolations  Medications Medications  oxyCODONE (Oxy IR/ROXICODONE) immediate release tablet 5-10 mg (has no administration in time range)  HYDROmorphone (DILAUDID) injection 0.5 mg (has no administration in time range)  oxyCODONE-acetaminophen (PERCOCET/ROXICET) 5-325 MG per tablet 1 tablet (1 tablet Oral Given 12/08/22 1142)  fentaNYL (SUBLIMAZE) injection 50 mcg (50 mcg Intravenous Given 12/08/22 1728)  prochlorperazine (COMPAZINE) injection 5 mg (5 mg Intravenous Given 12/08/22 1728)    Mobility non-ambulatory

## 2022-12-08 NOTE — Progress Notes (Signed)
Called to start PIV. Patient in radiology at this time. RN will notify when patient is back.

## 2022-12-09 ENCOUNTER — Encounter (HOSPITAL_COMMUNITY): Payer: Self-pay | Admitting: Nephrology

## 2022-12-09 ENCOUNTER — Other Ambulatory Visit: Payer: Self-pay

## 2022-12-09 DIAGNOSIS — S72002A Fracture of unspecified part of neck of left femur, initial encounter for closed fracture: Secondary | ICD-10-CM | POA: Diagnosis not present

## 2022-12-09 DIAGNOSIS — H409 Unspecified glaucoma: Secondary | ICD-10-CM

## 2022-12-09 DIAGNOSIS — I739 Peripheral vascular disease, unspecified: Secondary | ICD-10-CM

## 2022-12-09 DIAGNOSIS — N186 End stage renal disease: Secondary | ICD-10-CM | POA: Diagnosis not present

## 2022-12-09 DIAGNOSIS — Z992 Dependence on renal dialysis: Secondary | ICD-10-CM

## 2022-12-09 LAB — COMPREHENSIVE METABOLIC PANEL
ALT: 20 U/L (ref 0–44)
AST: 25 U/L (ref 15–41)
Albumin: 3.3 g/dL — ABNORMAL LOW (ref 3.5–5.0)
Alkaline Phosphatase: 88 U/L (ref 38–126)
Anion gap: 15 (ref 5–15)
BUN: 44 mg/dL — ABNORMAL HIGH (ref 8–23)
CO2: 32 mmol/L (ref 22–32)
Calcium: 9.8 mg/dL (ref 8.9–10.3)
Chloride: 92 mmol/L — ABNORMAL LOW (ref 98–111)
Creatinine, Ser: 10.03 mg/dL — ABNORMAL HIGH (ref 0.61–1.24)
GFR, Estimated: 5 mL/min — ABNORMAL LOW (ref >60)
Glucose, Bld: 120 mg/dL — ABNORMAL HIGH (ref 70–99)
Potassium: 4.9 mmol/L (ref 3.5–5.1)
Sodium: 139 mmol/L (ref 135–145)
Total Bilirubin: 0.7 mg/dL (ref 0.3–1.2)
Total Protein: 6.8 g/dL (ref 6.5–8.1)

## 2022-12-09 LAB — CBC
HCT: 30.3 % — ABNORMAL LOW (ref 39.0–52.0)
Hemoglobin: 9.9 g/dL — ABNORMAL LOW (ref 13.0–17.0)
MCH: 34.1 pg — ABNORMAL HIGH (ref 26.0–34.0)
MCHC: 32.7 g/dL (ref 30.0–36.0)
MCV: 104.5 fL — ABNORMAL HIGH (ref 80.0–100.0)
Platelets: 145 10*3/uL — ABNORMAL LOW (ref 150–400)
RBC: 2.9 MIL/uL — ABNORMAL LOW (ref 4.22–5.81)
RDW: 15.3 % (ref 11.5–15.5)
WBC: 13.4 10*3/uL — ABNORMAL HIGH (ref 4.0–10.5)
nRBC: 0 % (ref 0.0–0.2)

## 2022-12-09 LAB — HEPATITIS B SURFACE ANTIGEN: Hepatitis B Surface Ag: NONREACTIVE

## 2022-12-09 LAB — HIV ANTIBODY (ROUTINE TESTING W REFLEX): HIV Screen 4th Generation wRfx: NONREACTIVE

## 2022-12-09 MED ORDER — DORZOLAMIDE HCL-TIMOLOL MAL 2-0.5 % OP SOLN
1.0000 [drp] | Freq: Two times a day (BID) | OPHTHALMIC | Status: DC
  Administered 2022-12-09 – 2022-12-10 (×4): 1 [drp] via OPHTHALMIC
  Filled 2022-12-09: qty 10

## 2022-12-09 MED ORDER — SEVELAMER CARBONATE 800 MG PO TABS
4000.0000 mg | ORAL_TABLET | Freq: Three times a day (TID) | ORAL | Status: DC
  Administered 2022-12-09 (×2): 4000 mg via ORAL
  Filled 2022-12-09 (×3): qty 5

## 2022-12-09 MED ORDER — SEVELAMER CARBONATE 800 MG PO TABS
1600.0000 mg | ORAL_TABLET | ORAL | Status: DC
  Administered 2022-12-09 – 2022-12-10 (×2): 1600 mg via ORAL
  Filled 2022-12-09: qty 2

## 2022-12-09 MED ORDER — CHLORHEXIDINE GLUCONATE CLOTH 2 % EX PADS
6.0000 | MEDICATED_PAD | Freq: Every day | CUTANEOUS | Status: DC
  Administered 2022-12-10: 6 via TOPICAL

## 2022-12-09 MED ORDER — ACETAMINOPHEN 325 MG PO TABS
650.0000 mg | ORAL_TABLET | Freq: Four times a day (QID) | ORAL | Status: AC | PRN
  Administered 2022-12-10 (×2): 650 mg via ORAL
  Filled 2022-12-09 (×2): qty 2

## 2022-12-09 MED ORDER — BRIMONIDINE TARTRATE 0.2 % OP SOLN
1.0000 [drp] | Freq: Two times a day (BID) | OPHTHALMIC | Status: DC
  Administered 2022-12-09 – 2022-12-10 (×4): 1 [drp] via OPHTHALMIC
  Filled 2022-12-09: qty 5

## 2022-12-09 MED ORDER — NETARSUDIL DIMESYLATE 0.02 % OP SOLN: 1.0000 [drp] | Freq: Every day | OPHTHALMIC | Status: DC

## 2022-12-09 MED ORDER — RENA-VITE PO TABS
1.0000 | ORAL_TABLET | Freq: Every day | ORAL | Status: DC
  Administered 2022-12-09 – 2022-12-10 (×2): 1 via ORAL
  Filled 2022-12-09 (×2): qty 1

## 2022-12-09 MED ORDER — LATANOPROST 0.005 % OP SOLN
1.0000 [drp] | Freq: Every day | OPHTHALMIC | Status: DC
  Administered 2022-12-09 – 2022-12-10 (×2): 1 [drp] via OPHTHALMIC
  Filled 2022-12-09: qty 2.5

## 2022-12-09 MED ORDER — DOCUSATE SODIUM 100 MG PO CAPS
100.0000 mg | ORAL_CAPSULE | Freq: Three times a day (TID) | ORAL | Status: DC
  Administered 2022-12-09 – 2022-12-10 (×3): 100 mg via ORAL
  Filled 2022-12-09 (×3): qty 1

## 2022-12-09 MED ORDER — KATE FARMS STANDARD 1.4 PO LIQD
325.0000 mL | Freq: Two times a day (BID) | ORAL | Status: DC
  Filled 2022-12-09 (×4): qty 325

## 2022-12-09 MED ORDER — CINACALCET HCL 30 MG PO TABS
30.0000 mg | ORAL_TABLET | Freq: Every day | ORAL | Status: DC
  Administered 2022-12-09: 30 mg via ORAL
  Filled 2022-12-09: qty 1

## 2022-12-09 NOTE — Progress Notes (Signed)
ORTHOPAEDIC PROGRESS NOTE  Chief Complaint: left hip pain   SUBJECTIVE: Patient has been transferred to [facility]. He was seen on 5N. Patient states his pain has improved with medication. He has found a comfortable position for him to sit in. We had a length discussion about plan moving forward. All of his questions were answered at length.   OBJECTIVE: PE: Unchanged from last night  YQM:VHQION to palpation left hip. Swelling noted here. ROM not tested in setting of known fracture. + GS/TA/EHL. Sensation intact in DP/SP/S/S/P distributions. 2+ DP pulse with warm and well perfused digits. Compartments soft and compressible, with no pain on passive stretch.   Vitals:   12/08/22 2337 12/09/22 0813  BP: (!) 185/99 (!) 163/82  Pulse: 85 90  Resp:  18  Temp: 98.7 F (37.1 C) 99.8 F (37.7 C)  SpO2: 100% 99%   CT of the left hip demonstrates a nondisplaced pathologic fracture of the left femoral neck with multiple underlying lytic lesions   CT chest abdomen pelvis demonstrates multiple lytic lesions - bilateral proximal femurs, proximal humeri and scapulae  ASSESSMENT: Timothy Ponce is a 66 y.o. male pathologic fracture of the left femoral neck with multiple underlying lytic lesions  PLAN: Patient and I had a lengthy conversation regarding next steps. CT confirmed he has multiple lytic lesions so it would be safe to proceed with IMN left femur prior to completely his cancer work-up. Discussed we do not have the official diagnosis of his cancer at this moment but it is likely metastatic versus multiple myeloma.   We again discussed the nature of the injury as well as the care with the patient.  Discussed options and non-operative versus operative measures. Nonoperative measures are not well tolerated as patient's on bedrest for extended periods of time tend to develop secondary issues such as pneumonia, urinary tract infections, bedsores and delirium.  Based on this our  recommendation is for operative measures. He is in agreement with this.    Understanding this the patient/family elected to proceed with operative measures.  The risks and benefits of  surgical intervention including infection, bleeding, nerve injury, periprosthetic fracture, the need for revision surgery, leg length discrepancy, gait change, blood clots, cardiopulmonary complications, morbidity, mortality, among others, and they were willing to proceed. Discussed with his new cancer diagnosis and likely need for future radiation versus chemo and end stage kidney disease he will be at an extremely high risk for infection. However with his significant pain and inability to weight bear operative fixation to allow him to weight bear after surgery and help with his pain. Discussed the risk for continued pain afterwards.   Patient is scheduled for dialysis Tuesday, Thursday, Saturday. He received dialysis on Saturday and will be scheduled for dialysis tomorrow. There is only one OR room available today for the Christmas holiday. Currently there is no OR availability to get this done in a timely fashion today. Due to the lack of OR availability and his need for dialysis tomorrow, his surgery will get postponed. The plan now would be surgery possibly on Wednesday. I discussed this with the patient who understood. I will change his diet order.   - Plan: IMN left pathologic femur fracture (date TBD - possibly Wednesday - this depends on dialysis, OR availability due to the holiday)  -Medicine team has admitted the patient - Weight Bearing Status/Activity: will ammend WB status postop, bedrest for now - PT/OT post op - VTE Prophylaxis: SCDs for now -  Pain control: PRN pain medications - Dispo: Likely to require Rehab or SNF placement upon discharge.  - Contact information: Dr. Ophelia Charter, Noemi Chapel, PA-C   [place], [place] 12/09/2022

## 2022-12-09 NOTE — Consult Note (Signed)
Renal Service Consult Note [facility]  Timothy Ponce 12/09/2022 Timothy Blazing, MD Requesting Physician: Dr. Cathlean Ponce  Reason for Consult: ESRD pt w/ hip fracture HPI: The patient is a 66 y.o. year-old w/ PMH as below who presented after a fall and was found to have a L hip fracture. Also there were multiple bony lytic lesions by imaging and no hx of falls so this was considered a pathologic fracture. VSS in ED. Pt admitted, we are asked to see for ESRD.   Pt seen in room. No c/o's, pleasant, has not missed HD. States his R arm AVF is resting after revision surgery, using TDC now.  Max UF is 2.6 L.   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;  Surgeon: Timothy Minion, MD;  Location: [place];  Service: Orthopedics;  Laterality: Right;   AV FISTULA PLACEMENT     EYE SURGERY     cataract bilateral   EYE SURGERY Right    FISTULA SUPERFICIALIZATION Right 10/28/2022   Procedure: RIGHT ARM ARTERIOVENOUS FISTULA REVISION WITH COMPLEX SKIN AND SOFT TISSUE CLOSURE;  Surgeon: Timothy John, MD;  Location: [place];  Service: Vascular;  Laterality: Right;   INSERTION OF DIALYSIS CATHETER Right 12/08/2020   Procedure: INSERTION OF DIALYSIS CATHETER USING 19cm PALINDROME CHRONIC CATHETER;  Surgeon: Timothy Sandy, MD;  Location: [place];  Service: Vascular;  Laterality: Right;   INSERTION OF DIALYSIS CATHETER Right 10/28/2022   Procedure: INSERTION OF RIGHT INTERNAL JUGULAR  TUNNELED DIALYSIS CATHETER;  Surgeon: Timothy John, MD;  Location: [facility] OR;  Service: Vascular;  Laterality: Right;   REVISON OF ARTERIOVENOUS FISTULA Right 12/06/2020   Procedure: REVISON OF RIGHT ARM FISTULA;  Surgeon: Timothy Posner, MD;  Location: [facility] OR;  Service: Vascular;  Laterality: Right;   Family History History reviewed. No pertinent family history. Social History  reports that he has never smoked. He has never been exposed to tobacco smoke. He has never used smokeless tobacco. He reports that he does not drink alcohol and does not use drugs. Allergies  Allergies  Allergen Reactions   Iron Dextran Shortness Of Breath    Infed Iron Infusion    Darvon [Propoxyphene] Other (See Comments)    Lungs filling up with fluid (reaction to Darvon/Darvocet)     Lactose Other (See Comments)    Gi upset/ sugar in milk   Milk (Cow) Other (See Comments)    Gi upset   Home medications Prior to Admission medications   Medication Sig Start Date End Date Taking? Authorizing Provider  acetaminophen (TYLENOL) 325 MG tablet Take 650 mg by mouth 2 (two) times daily as needed for moderate pain.   Yes [provider]  acetaminophen (TYLENOL) 650 MG CR tablet Take 650 mg by mouth 2 (two) times daily as needed (foot pain).   Yes [provider]  bimatoprost (LUMIGAN) 0.01 %  SOLN Place 1 drop into both eyes at bedtime.   Yes [provider]  brimonidine (ALPHAGAN) 0.2 % ophthalmic solution Place 1 drop into both eyes in the morning and at bedtime. 07/01/21  Yes [provider]  cinacalcet (SENSIPAR) 30 MG tablet Take 30 mg by mouth daily with supper. 07/20/20  Yes [provider]  docusate sodium (COLACE) 100 MG capsule Take 100 mg by mouth in the morning, at noon, and at bedtime.   Yes [provider]  dorzolamide-timolol (COSOPT) 22.3-6.8 MG/ML ophthalmic solution Place 1 drop into both eyes 2 (two) times daily.  02/05/20  Yes [provider]   multivitamin (RENA-VIT) TABS tablet Take 1 tablet by mouth daily. 10/25/14  Yes [provider]  Netarsudil Dimesylate (RHOPRESSA) 0.02 % SOLN Place 1 drop into both eyes at bedtime.   Yes [provider]  predniSONE (DELTASONE) 10 MG tablet Take 2 tablets (20 mg total) by mouth daily. Patient taking differently: Take by mouth. Take as directed in dose pack 11/20/22  Yes Timothy Ferguson, MD  sevelamer carbonate (RENVELA) 800 MG tablet Take 1,600-4,000 mg by mouth See admin instructions. Take 5 tablets (4000 mg) by mouth 2 -3 times daily with meals, take 2 tablets (1600 mg) with snacks 01/14/20  Yes [provider]  oxyCODONE-acetaminophen (PERCOCET) 5-325 MG tablet Take 1 tablet by mouth every 6 (six) hours as needed. Patient not taking: Reported on 12/08/2022 11/20/22   Timothy Ferguson, MD     Vitals:   12/08/22 1839 12/08/22 1956 12/08/22 2337 12/09/22 0813  BP:  (!) 186/91 (!) 185/99 (!) 163/82  Pulse:  70 85 90  Resp:  18  18  Temp: 98.6 F (37 C) 98.9 F (37.2 C) 98.7 F (37.1 C) 99.8 F (37.7 C)  TempSrc: Oral Oral Oral Oral  SpO2:  100% 100% 99%  Weight:      Height:       Exam Gen alert, no distress, blind No rash, cyanosis or gangrene Sclera anicteric, throat clear  No jvd or bruits Chest clear bilat to bases, no rales/ wheezing RRR no MRG Abd soft ntnd no mass or ascites +bs GU normal male MS no joint effusions or deformity Ext no LE or UE edema, no wounds or ulcers Neuro is alert, Ox 3 , nf    RIJ TDC, RUE AVF +Bruit      Home meds include - sensipar 30 hs, colace, eye drops, renavite, prns/ supps/ vits    OP HD: [facility] TTS 3.5h  58.4kg  400/1.5  2/2 bath  Hep 1800 RIJ TDC (AVF resting after revision) - rocaltrol 1.0 mcg po tiw - mircera 50 q2, last 12/21 - last HD 12/23, post wt 60kg  CXR 12/24 - no active disease   Assessment/ Plan: L hip fracture - per ortho Lytic bone lesions - SPEP ordered ESRD - on HD TTS. HD tomorrow.   Vol / BP - not on BP lowering meds, BP's up. Recheck after HD today. Euvolemic on exam.  MBD ckd - CCa in range, add on phos and cont po vdra and renvela as binder.  Anemia esrd - Hb 9s, just got OP esa 12/21. Follow.  Glaucoma - per pmd      Kelly Splinter  MD 12/09/2022, 3:59 PM Recent Labs  Lab 12/08/22 2032 12/09/22 0303  HGB 9.5* 9.9*  ALBUMIN 3.6 3.3*  CALCIUM 9.6 9.8  CREATININE 9.14* 10.03*  K 5.0 4.9   Inpatient medications:  brimonidine  1  drop Both Eyes BID   cinacalcet  30 mg Oral Q supper   docusate sodium  100 mg Oral TID   dorzolamide-timolol  1 drop Both Eyes BID   latanoprost  1 drop Both Eyes QHS   multivitamin  1 tablet Oral QHS   sevelamer carbonate  1,600 mg Oral With snacks   sevelamer carbonate  4,000 mg Oral TID with meals    HYDROmorphone (DILAUDID) injection, oxyCODONE

## 2022-12-09 NOTE — Plan of Care (Signed)

## 2022-12-09 NOTE — Progress Notes (Signed)
Progress Note   Patient: Timothy Ponce HGD:[id number] DOB: [birth year] DOA: 12/08/2022     1 DOS: the patient was seen and examined on 12/09/2022   Brief hospital course: Timothy Ponce was admitted to the hospital with the working diagnosis of left hip fracture.   66 yo male with the past medical history of cataracts, glaucoma, ESRD (T, Th, Sat HD), left hip pain. He was evaluated in the ED on 12/05 for left hip pain, his radiographs were negative and he was referred to outpatient orthopedics. As outpatient he had a left hip CT on 12/20 and was diagnosed with acute non displaced pathologic fracture of the left femoral neck, through underlying lytic lesions. On his initial physical examination his blood pressure was 153/84, HR 70, RR 16 and 02 saturation 100% on room air. Lungs with no wheezing or rales, heart with S1 and S2 present and rhythmic, abdomen with no distention and no lower extremity edema. Tender to palpation on the left hip.   NA 139, K 5.0 Cl 94 bicarbonate 31, glucose 109, bun 40 cr 9,14 Wbc 12.8 hgb 9,5 plt 143   Chest radiograph with hyperinflation. Non effusions or infiltrates.   EKG with 64 bpm, normal axis, normal intervals, sinus rhythm, with no significant ST segment or T wave changes.   CT chest, abdomen and pelvis, with increased displacement of known pathologic fracture through the left femoral neck.  Multiple lytic lesions within the bilateral proxima femur as well as the included proximal humrei and scapulae.  Complex collection along the course of the distal left iliopsoas tendon (8,7, 4.4, 4,8 cm) consistent with iliopsoas bursitis.  Enlarged multinodular left thyroid nodule extending into the superior mediastinum (recommended non urgent thyroid ultrasound).   Orthopedics was consulted with plans to intervene surgically on left hip.   Assessment and Plan: * Closed left hip fracture, initial encounter [facility]) Findings suggestive of pathologic fracture.  He has  secondary hyperparathyroidism.  Plan to continue pain control, DVT prophylaxis and follow up with orthopedics recommendations.  He will have surgery possible on 12/27. Follow up serum electrophoresis, may need PER scan as outpatient.   ESRD on hemodialysis [facility]) Patient euvolemic today.  He had HD 12/23.   Plan to continue renal replacement therapy during his hospitalization.   Metabolic bone disease continue with cinacalcet and sevelamer.  Check serum PTH.   Anemia of chronic renal disease, hgb has been stable, no current indication for PRBC transfusion.         Subjective: Patient with persistent left hip pain, but controlled with analgesics, no chest pain or dyspnea.   Physical Exam: Vitals:   12/08/22 1839 12/08/22 1956 12/08/22 2337 12/09/22 0813  BP:  (!) 186/91 (!) 185/99 (!) 163/82  Pulse:  70 85 90  Resp:  18  18  Temp: 98.6 F (37 C) 98.9 F (37.2 C) 98.7 F (37.1 C) 99.8 F (37.7 C)  TempSrc: Oral Oral Oral Oral  SpO2:  100% 100% 99%  Weight:      Height:       Neurology awake and alert ENT with mild pallor Cardiovascular with S1 and S2 present and rhythmic Respiratory with no rales or wheezing Abdomen with no distention  No lower extremity edema  Data Reviewed:    Family Communication: no family at the bedside   Disposition: Status is: Inpatient Remains inpatient appropriate because: left hip fracture, requiring surgical intervention   Planned Discharge Destination: Home     Author: Tawni Millers, MD  12/09/2022 10:35 AM  For on call review [email or web address].

## 2022-12-09 NOTE — Consult Note (Signed)
ORTHOPAEDIC CONSULTATION  REQUESTING PHYSICIAN: Arrien, Jimmy Picket,*  Chief Complaint: left hip pain  HPI: Timothy Ponce is a 66 y.o. male with medical history significant of cataract, glaucoma, anesthesia complications (awakens during surgery), dyspnea, ESRD on hemodialysis TU/TH/SAT with last HD session done yesterday is coming to the emergency department with hip pain. Work-up in the ED was significant for pathologic left femur fracture. Orthopedics was consulted.  Patient seen in the ED at [facility]. He states he has had left hip pain for about a month. He was seen at an urgent care and they sent him for a CT scan of the left hip. His nephrologist told him the results when he went for dialysis on Thursday. He was scheduled for a cancer work-up this upcoming Friday. He came to the ED as the pain in his left hip was so bad he was having difficulty walking. He states he could not get up the stairs in his house due to pain. He was afraid he would be stuck in his bed because of the pain. He lives at home with his mother who is 2. He denies any falls recently. He has no prior history of cancer. He has been on dialysis for about 30 years. He notes that before three months ago he has had no prior history of left hip pain. He normally ambulates without assistance.    Past Medical History:  Diagnosis Date   Cataract    Complication of anesthesia    "Awakens during surgery"   Dyspnea    ESRD (end stage renal disease) ([place])    TTHSAT, [address]    Glaucoma    Neuropathy    hands - per patient   Past Surgical History:  Procedure Laterality Date   AMPUTATION Right 06/02/2020   Procedure: RIGHT FOOT 5TH RAY AMPUTATION;  Surgeon: Newt Minion, MD;  Location: [place];  Service: Orthopedics;  Laterality: Right;   AV FISTULA PLACEMENT     EYE SURGERY     cataract bilateral   EYE SURGERY Right    FISTULA SUPERFICIALIZATION Right 10/28/2022   Procedure: RIGHT ARM ARTERIOVENOUS  FISTULA REVISION WITH COMPLEX SKIN AND SOFT TISSUE CLOSURE;  Surgeon: Broadus John, MD;  Location: [place];  Service: Vascular;  Laterality: Right;   INSERTION OF DIALYSIS CATHETER Right 12/08/2020   Procedure: INSERTION OF DIALYSIS CATHETER USING 19cm PALINDROME CHRONIC CATHETER;  Surgeon: Waynetta Sandy, MD;  Location: [place];  Service: Vascular;  Laterality: Right;   INSERTION OF DIALYSIS CATHETER Right 10/28/2022   Procedure: INSERTION OF RIGHT INTERNAL JUGULAR TUNNELED DIALYSIS CATHETER;  Surgeon: Broadus John, MD;  Location: [facility] OR;  Service: Vascular;  Laterality: Right;   REVISON OF ARTERIOVENOUS FISTULA Right 12/06/2020   Procedure: REVISON OF RIGHT ARM FISTULA;  Surgeon: Rosetta Posner, MD;  Location: [facility] OR;  Service: Vascular;  Laterality: Right;   Social History   Socioeconomic History   Marital status: Single    Spouse name: Not on file   Number of children: Not on file   Years of education: Not on file   Highest education level: Not on file  Occupational History   Not on file  Tobacco Use   Smoking status: Never    Passive exposure: Never   Smokeless tobacco: Never  Vaping Use   Vaping Use: Never used  Substance and Sexual Activity   Alcohol use: Never   Drug use: Never   Sexual activity: Not on file  Other Topics Concern   Not on file  Social History Narrative   Are you right handed or left handed? Right handed    Are you currently employed ? no   What is your current occupation?   Do you live at home alone? no   Who lives with you? Lives with mother   What type of home do you live in: 1 story or 2 story?        Social Determinants of Health   Financial Resource Strain: Not on file  Food Insecurity: No Food Insecurity (12/09/2022)   Hunger Vital Sign    Worried About Running Out of Food in the Last Year: Never true    Ran Out of Food in the Last Year: Never true  Transportation Needs: No Transportation Needs (12/09/2022)   PRAPARE -  Hydrologist (Medical): No    Lack of Transportation (Non-Medical): No  Physical Activity: Not on file  Stress: Not on file  Social Connections: Not on file   History reviewed. No pertinent family history. Allergies  Allergen Reactions   Iron Dextran Shortness Of Breath    Infed Iron Infusion    Darvon [Propoxyphene] Other (See Comments)    Lungs filling up with fluid (reaction to Darvon/Darvocet)     Lactose Other (See Comments)    Gi upset/ sugar in milk   Milk (Cow) Other (See Comments)    Gi upset   Prior to Admission medications   Medication Sig Start Date End Date Taking? Authorizing Provider  acetaminophen (TYLENOL) 325 MG tablet Take 650 mg by mouth 2 (two) times daily as needed for moderate pain.   Yes [provider]  acetaminophen (TYLENOL) 650 MG CR tablet Take 650 mg by mouth 2 (two) times daily as needed (foot pain).   Yes [provider]  bimatoprost (LUMIGAN) 0.01 % SOLN Place 1 drop into both eyes at bedtime.   Yes [provider]  brimonidine (ALPHAGAN) 0.2 % ophthalmic solution Place 1 drop into both eyes in the morning and at bedtime. 07/01/21  Yes [provider]  cinacalcet (SENSIPAR) 30 MG tablet Take 30 mg by mouth daily with supper. 07/20/20  Yes [provider]  docusate sodium (COLACE) 100 MG capsule Take 100 mg by mouth in the morning, at noon, and at bedtime.   Yes [provider]  dorzolamide-timolol (COSOPT) 22.3-6.8 MG/ML ophthalmic solution Place 1 drop into both eyes 2 (two) times daily.  02/05/20  Yes [provider]  multivitamin (RENA-VIT) TABS tablet Take 1 tablet by mouth daily. 10/25/14  Yes [provider]  Netarsudil Dimesylate (RHOPRESSA) 0.02 % SOLN Place 1 drop into both eyes at bedtime.   Yes [provider]  predniSONE (DELTASONE) 10 MG tablet Take 2 tablets (20 mg total) by mouth daily. Patient taking differently: Take by mouth. Take  as directed in dose pack 11/20/22  Yes Milton Ferguson, MD  sevelamer carbonate (RENVELA) 800 MG tablet Take 1,600-4,000 mg by mouth See admin instructions. Take 5 tablets (4000 mg) by mouth 2 -3 times daily with meals, take 2 tablets (1600 mg) with snacks 01/14/20  Yes [provider]  oxyCODONE-acetaminophen (PERCOCET) 5-325 MG tablet Take 1 tablet by mouth every 6 (six) hours as needed. Patient not taking: Reported on 12/08/2022 11/20/22   Milton Ferguson, MD   CT CHEST ABDOMEN PELVIS WO CONTRAST  Result Date: 12/08/2022 CLINICAL DATA:  Metastatic disease evaluation. Lytic bone lesions with known pathologic fracture  of the left femoral neck. EXAM: CT CHEST, ABDOMEN AND PELVIS WITHOUT CONTRAST TECHNIQUE: Multidetector CT imaging of the chest, abdomen and pelvis was performed following the standard protocol without IV contrast. RADIATION DOSE REDUCTION: This exam was performed according to the departmental dose-optimization program which includes automated exposure control, adjustment of the mA and/or kV according to patient size and/or use of iterative reconstruction technique. COMPARISON:  Hip CT 12/04/2022 FINDINGS: CT CHEST FINDINGS Cardiovascular: Heart size is normal. No pericardial effusion. Thoracic aorta is nonaneurysmal. Scattered atherosclerotic calcifications of the aorta and coronary arteries. Pulmonary vasculature is nondilated. Right IJ approach central line terminates at the level of the distal SVC. Mediastinum/Nodes: Mildly enlarged right axillary lymph node measuring 12 mm short axis. Mildly prominent, although not pathologically enlarged left axillary lymph node. No mediastinal or hilar lymphadenopathy. Enlarged multinodular left thyroid lobe extending into the superior mediastinum. Trachea and esophagus within normal limits. Lungs/Pleura: Minimal linear atelectasis at the right lung base. The lungs are otherwise clear. No pulmonary nodules or masses. No airspace consolidation. No  pleural effusion or pneumothorax. Musculoskeletal: Lytic lesions within the included proximal humeri and scapulae without evidence of a pathologic fracture. Left glenohumeral joint effusion. No well-defined lucent lesion of the thoracic spine or bilateral ribs. CT ABDOMEN PELVIS FINDINGS Hepatobiliary: Unremarkable unenhanced appearance of the liver. No focal lesion identified. Unremarkable gallbladder. Pancreas: Evaluation limited by lack of IV contrast and relative paucity of intra-fat. Within this limitation, there is no identifiable pancreatic abnormality or associated inflammatory changes. Spleen: Normal in size without focal abnormality. Adrenals/Urinary Tract: No adrenal gland nodules or masses are seen. Multi-cystic kidneys with associated parenchymal atrophy. Rim calcifications of the cysts bilaterally. No specific follow-up imaging recommended for the cysts. No hydronephrosis. Urinary bladder is decompressed. Stomach/Bowel: Stomach is within normal limits. Moderate volume of stool throughout the colon. No evidence of bowel wall thickening, distention, or inflammatory changes. Vascular/Lymphatic: Aortic atherosclerosis. No enlarged abdominal or pelvic lymph nodes. Reproductive: Prostate is unremarkable. Other: No free fluid. No abdominopelvic fluid collection. No pneumoperitoneum. No abdominal wall hernia. Musculoskeletal: Increased displacement of known pathologic fracture through the left femoral neck, which is now mildly displaced (series 2, image 113). Multiple lytic lesions within the bilateral proximal femurs. Cystic structures within the bilateral acetabula, which may represent lytic bone lesions or degenerative subchondral cysts. Inferior endplate Schmorl's node at L2. Schmorl's node or small lucent lesion in the L1 vertebral body. Vertebral body heights and alignment are maintained. Complex collection along the course of the distal left iliopsoas tendon has increased from prior now measuring  approximately 8.7 x 4.4 x 4.8 cm (series 2, image 116), previously 6.9 x 2.2 x 2.9 cm. IMPRESSION: 1. Increased displacement of known pathologic fracture through the left femoral neck, which is now mildly displaced. 2. Multiple lytic lesions within the bilateral proximal femurs as well as the included proximal humeri and scapulae. No additional pathologic fractures. Findings remain most compatible with multiple myeloma or metastatic disease. 3. Complex collection along the course of the distal left iliopsoas tendon has increased from prior, now measuring approximately 8.7 x 4.4 x 4.8 cm, previously 6.9 x 2.2 x 2.9 cm. Findings are favored to represent a complex iliopsoas bursitis. 4. Mildly enlarged right axillary lymph node measuring 12 mm short axis, nonspecific. 5. Multi-cystic kidneys with associated parenchymal atrophy. 6. Enlarged multinodular left thyroid lobe extending into the superior mediastinum. Nonemergent thyroid ultrasound recommended. 7. Aortic and coronary artery atherosclerosis (ICD10-I70.0). Electronically Signed   By: Davina Poke D.O.  On: 12/08/2022 20:02   Chest Portable 1 View  Result Date: 12/08/2022 CLINICAL DATA:  Preop for hip fracture. EXAM: PORTABLE CHEST 1 VIEW COMPARISON:  10/28/2022. FINDINGS: Cardiac silhouette normal in size configuration. Normal mediastinal and hilar contours. Clear lungs. Right anterior chest wall, internal jugular, tunneled dual lumen central venous catheter is stable. Skeletal structures are grossly intact. IMPRESSION: No active disease. Electronically Signed   By: Lajean Manes M.D.   On: 12/08/2022 15:57   Family History Reviewed and non-contributory, no pertinent history of problems with bleeding or anesthesia      Review of Systems 14 system ROS conducted and negative except for that noted in HPI   OBJECTIVE  Vitals:Patient Vitals for the past 8 hrs:  BP Temp Temp src Pulse Resp SpO2  12/09/22 0813 (!) 163/82 99.8 F (37.7 C) Oral 90  18 99 %   General: Alert, no acute distress Cardiovascular: Warm extremities noted Respiratory: No cyanosis, no use of accessory musculature GI: No organomegaly, abdomen is soft and non-tender Skin: No lesions in the area of chief complaint other than those listed below in MSK exam.  Neurologic: Sensation intact distally save for the below mentioned MSK exam Psychiatric: Patient is competent for consent with normal mood and affect Lymphatic: No swelling obvious and reported other than the area involved in the exam below  Extremities  RUE: right arm radiocephalic fistula. Right jugular vein dialysis catheter. Actively moves right upper arm without any pain. NVI LUE:old fistula site present. Actively moves left upper arm without any pain. NVI RLE: actively moves right lower extremity without pain. NVI. Right fifth toe amputation ZWC:HENIDP to palpation left hip. Swelling noted here. ROM not tested in setting of known fracture. + GS/TA/EHL. Sensation intact in DP/SP/S/S/P distributions. 2+ DP pulse with warm and well perfused digits. Compartments soft and compressible, with no pain on passive stretch.     Test Results Imaging CT of the left hip demonstrates a nondisplaced pathologic fracture of the left femoral neck with multiple underlying lytic lesions  Labs cbc Recent Labs    12/08/22 2032 12/09/22 0303  WBC 12.8* 13.4*  HGB 9.5* 9.9*  HCT 29.7* 30.3*  PLT 143* 145*    Labs inflam No results for input(s): "CRP" in the last 72 hours.  Invalid input(s): "ESR"  Labs coag No results for input(s): "INR", "PTT" in the last 72 hours.  Invalid input(s): "PT"  Recent Labs    12/08/22 2032 12/09/22 0303  NA 139 139  K 5.0 4.9  CL 94* 92*  CO2 31 32  GLUCOSE 109* 120*  BUN 40* 44*  CREATININE 9.14* 10.03*  CALCIUM 9.6 9.8     ASSESSMENT AND PLAN: 66 y.o. male with the following: pathologic fracture of the left hip in a patient with multiple co-morbidities and no  previous cancer diagnosis.   Orthopedics recommends admission to a medical service and we will provide consultation and follow along.  Spoke with the patient regarding his extremely complex situation. With no prior history of cancer we are unable to know if this is a primary bone cancer versus metastatic disease. This makes the decision to move forward with surgery complicated. If this is a primary bone tumor we risk seeding the cancer. He needs a detailed cancer work-up to determine primary versus metastatic disease.   I will order CT chest abdomen pelvis to look for other lytic lesions. If he has lytic lesions elsewhere, we would be fine to move forward with surgery. If it  is solitary, we would have to delay. I discussed this at length with the patient. He had just received medication prior to our discussion and seemed very drowsy during the conversation. However, he stated his understanding of this. He is getting transferred to [facility] for dialysis. We will continue to follow.   We did discuss the nature of the injury as well as the care with the patient.  Discussed options and non-operative versus operative measures. Nonoperative measures are not well tolerated as patient's on bedrest for extended periods of time tend to develop secondary issues such as pneumonia, urinary tract infections, bedsores and delirium.  Based on this our recommendation is for operative measures. He is in agreement with this.   Understanding this the patient/family elected to proceed with operative measures.  The risks and benefits of  surgical intervention including infection, bleeding, nerve injury, periprosthetic fracture, the need for revision surgery, leg length discrepancy, gait change, blood clots, cardiopulmonary complications, morbidity, mortality, among others, and they were willing to proceed.    We will plan surgery depending on results of CT scan.   - Plan: IMN left pathologic femur fracture (date TBD -  depends on CT scan, dialysis, OR availability due to the holiday)  - NPO at midnight  -Medicine team to admit and perform pre-op clearance - Weight Bearing Status/Activity: will ammend WB status postop, bedrest for now - PT/OT post op - VTE Prophylaxis: SCDs for now - Pain control: PRN pain medications - Dispo: Likely to require Rehab or SNF placement upon discharge.  - Contact information: Dr. Ophelia Charter, Noemi Chapel, PA-C  [place], [place] 12/08/2022

## 2022-12-09 NOTE — Assessment & Plan Note (Signed)
Findings suggestive of pathologic fracture.  He has secondary hyperparathyroidism.  Plan to continue pain control, DVT prophylaxis and follow up with orthopedics recommendations.  He will have surgery possible on 12/27. Follow up serum electrophoresis, may need PER scan as outpatient.

## 2022-12-09 NOTE — Progress Notes (Addendum)
Initial Nutrition Assessment  DOCUMENTATION CODES:   Not applicable  INTERVENTION:  - Add [employer] 1.4 PO BID, each supplement provides 455 kcal and 20 grams protein.   NUTRITION DIAGNOSIS:   Increased nutrient needs related to hip fracture as evidenced by estimated needs.  GOAL:   Patient will meet greater than or equal to 90% of their needs  MONITOR:   PO intake, Supplement acceptance  REASON FOR ASSESSMENT:   Consult Hip fracture protocol  ASSESSMENT:   66 y.o. male admits related to hip pain. PMH incudes: ESRD on HD. Pt is currently receiving medical management for closed left hip fracture.  Meds reviewed: colace, rena-vit. Labs reviewed.   No answer to pt's phone in room. No documented intakes. Pt with increased nutrient needs related to hip fracture. RD will add D[employer] [place] BID (pt is allergic to lactose) for now and continue to monitor PO intakes. Wts are stable per record.   NUTRITION - FOCUSED PHYSICAL EXAM:  Unable to assess, attempt at f/u.   Diet Order:   Diet Order             Diet NPO time specified  Diet effective midnight           Diet renal with fluid restriction Fluid restriction: 1200 mL Fluid; Room service appropriate? Yes; Fluid consistency: Thin  Diet effective now                   EDUCATION NEEDS:   Not appropriate for education at this time  Skin:  Skin Assessment: Reviewed RN Assessment  Last BM:  12/23  Height:   Ht Readings from Last 1 Encounters:  12/08/22 '5\' 8"'$  (1.727 m)    Weight:   Wt Readings from Last 1 Encounters:  12/08/22 59.9 kg    Ideal Body Weight:     BMI:  Body mass index is 20.07 kg/m.  Estimated Nutritional Needs:   Kcal:  1495-1795 kcals  Protein:  75-90 gm  Fluid:  >/= 1.4 L  Thalia Bloodgood, RD, LDN, CNSC.

## 2022-12-09 NOTE — Hospital Course (Addendum)
Mr. Timothy Ponce was admitted to the hospital with the working diagnosis of left hip fracture.   66 yo male with the past medical history of cataracts, glaucoma, ESRD (T, Th, Sat HD), left hip pain. He was evaluated in the ED on 12/05 for left hip pain, his radiographs were negative and he was referred to outpatient orthopedics. As outpatient he had a left hip CT on 12/20 and was diagnosed with acute non displaced pathologic fracture of the left femoral neck, through underlying lytic lesions. On his initial physical examination his blood pressure was 153/84, HR 70, RR 16 and 02 saturation 100% on room air. Lungs with no wheezing or rales, heart with S1 and S2 present and rhythmic, abdomen with no distention and no lower extremity edema. Tender to palpation on the left hip.   NA 139, K 5.0 Cl 94 bicarbonate 31, glucose 109, bun 40 cr 9,14 Wbc 12.8 hgb 9,5 plt 143   Chest radiograph with hyperinflation. Non effusions or infiltrates.   EKG with 64 bpm, normal axis, normal intervals, sinus rhythm, with no significant ST segment or T wave changes.   CT chest, abdomen and pelvis, with increased displacement of known pathologic fracture through the left femoral neck.  Multiple lytic lesions within the bilateral proxima femur as well as the included proximal humrei and scapulae.  Complex collection along the course of the distal left iliopsoas tendon (8,7, 4.4, 4,8 cm) consistent with iliopsoas bursitis.  Enlarged multinodular left thyroid nodule extending into the superior mediastinum (recommended non urgent thyroid ultrasound).   Orthopedics was consulted with plans to intervene surgically on left hip.

## 2022-12-09 NOTE — Assessment & Plan Note (Signed)
Patient euvolemic today.  He had HD 12/23.   Plan to continue renal replacement therapy during his hospitalization.   Metabolic bone disease continue with cinacalcet and sevelamer.  Check serum PTH.   Anemia of chronic renal disease, hgb has been stable, no current indication for PRBC transfusion.

## 2022-12-10 ENCOUNTER — Encounter (HOSPITAL_COMMUNITY): Payer: Self-pay | Admitting: Nephrology

## 2022-12-10 DIAGNOSIS — H409 Unspecified glaucoma: Secondary | ICD-10-CM | POA: Diagnosis not present

## 2022-12-10 DIAGNOSIS — E041 Nontoxic single thyroid nodule: Secondary | ICD-10-CM | POA: Insufficient documentation

## 2022-12-10 DIAGNOSIS — M899 Disorder of bone, unspecified: Secondary | ICD-10-CM | POA: Diagnosis not present

## 2022-12-10 DIAGNOSIS — S72002A Fracture of unspecified part of neck of left femur, initial encounter for closed fracture: Secondary | ICD-10-CM | POA: Diagnosis not present

## 2022-12-10 LAB — BASIC METABOLIC PANEL
Anion gap: 15 (ref 5–15)
BUN: 70 mg/dL — ABNORMAL HIGH (ref 8–23)
CO2: 32 mmol/L (ref 22–32)
Calcium: 9.2 mg/dL (ref 8.9–10.3)
Chloride: 89 mmol/L — ABNORMAL LOW (ref 98–111)
Creatinine, Ser: 12.46 mg/dL — ABNORMAL HIGH (ref 0.61–1.24)
GFR, Estimated: 4 mL/min — ABNORMAL LOW (ref >60)
Glucose, Bld: 94 mg/dL (ref 70–99)
Potassium: 5.8 mmol/L — ABNORMAL HIGH (ref 3.5–5.1)
Sodium: 136 mmol/L (ref 135–145)

## 2022-12-10 LAB — CBC
HCT: 27.3 % — ABNORMAL LOW (ref 39.0–52.0)
Hemoglobin: 8.8 g/dL — ABNORMAL LOW (ref 13.0–17.0)
MCH: 33 pg (ref 26.0–34.0)
MCHC: 32.2 g/dL (ref 30.0–36.0)
MCV: 102.2 fL — ABNORMAL HIGH (ref 80.0–100.0)
Platelets: 120 10*3/uL — ABNORMAL LOW (ref 150–400)
RBC: 2.67 MIL/uL — ABNORMAL LOW (ref 4.22–5.81)
RDW: 15.7 % — ABNORMAL HIGH (ref 11.5–15.5)
WBC: 14.7 10*3/uL — ABNORMAL HIGH (ref 4.0–10.5)
nRBC: 0 % (ref 0.0–0.2)

## 2022-12-10 LAB — PHOSPHORUS: Phosphorus: 6.9 mg/dL — ABNORMAL HIGH (ref 2.5–4.6)

## 2022-12-10 LAB — HEPATITIS B SURFACE ANTIBODY, QUANTITATIVE: Hep B S AB Quant (Post): 63.6 m[IU]/mL (ref >9.9)

## 2022-12-10 MED ORDER — ALTEPLASE 2 MG IJ SOLR: 2.0000 mg | Freq: Once | INTRAMUSCULAR | Status: DC | PRN

## 2022-12-10 MED ORDER — HEPARIN SODIUM (PORCINE) 1000 UNIT/ML DIALYSIS: 1500.0000 [IU] | INTRAMUSCULAR | Status: DC | PRN

## 2022-12-10 MED ORDER — CALCITRIOL 0.5 MCG PO CAPS
1.0000 ug | ORAL_CAPSULE | ORAL | Status: DC
  Administered 2022-12-10: 1 ug via ORAL
  Filled 2022-12-10: qty 2

## 2022-12-10 MED ORDER — HEPARIN SODIUM (PORCINE) 1000 UNIT/ML DIALYSIS: 1000.0000 [IU] | INTRAMUSCULAR | Status: DC | PRN

## 2022-12-10 MED ORDER — HEPARIN SODIUM (PORCINE) 1000 UNIT/ML IJ SOLN
INTRAMUSCULAR | Status: AC
  Administered 2022-12-10: 3200 [IU]
  Filled 2022-12-10: qty 4

## 2022-12-10 MED ORDER — HEPARIN SODIUM (PORCINE) 1000 UNIT/ML DIALYSIS
1500.0000 [IU] | INTRAMUSCULAR | Status: DC | PRN
  Filled 2022-12-10: qty 2

## 2022-12-10 MED ORDER — ANTICOAGULANT SODIUM CITRATE 4% (200MG/5ML) IV SOLN: 5.0000 mL | Status: DC | PRN

## 2022-12-10 NOTE — Progress Notes (Signed)
[facility] Progress Note  Subjective: seen in HD unit, no c/o's today .   Vitals:   12/10/22 1141 12/10/22 1216 12/10/22 1254 12/10/22 1455  BP: 122/86  (!) 155/82 (!) 169/87  Pulse: 93  92 (!) 107  Resp: 13     Temp: 98.2 F (36.8 C)  99.3 F (37.4 C) 99.2 F (37.3 C)  TempSrc: Oral  Oral Oral  SpO2: 98%  99% 100%  Weight:  59 kg    Height:        Exam:  alert, nad   no jvd  Chest cta bilat  Cor reg no RG  Abd soft ntnd no ascites   Ext no LE edema   Neuro is alert, Ox 3 , nf    RIJ TDC, RUE AVF +Bruit     Home meds include - sensipar 30 hs, colace, eye drops, renavite, prns/ supps/ vits      OP HD: [facility] TTS 3.5h  58.4kg  400/1.5  2/2 bath  Hep 1800 RIJ TDC (AVF resting after revision) - rocaltrol 1.0 mcg po tiw - mircera 50 q2, last 12/21 - last HD 12/23, post wt 60kg   CXR 12/24 - no active disease     Assessment/ Plan: L hip fracture - may be transferred to [facility] Lytic bone lesions - SPEP ordered ESRD - on HD TTS. HD today on schedule.  Vol / BP - not on BP lowering meds, BP's ok, exam good. 2L off w/ HD today and at dry wt.  MBD ckd - CCa in range, phos up a bit. Will cont po vdra and renvela as binder.  Anemia esrd - Hb 8s- 9s, just got OP esa 12/21. Follow.  Glaucoma - per pmd     Rob [place] 12/10/2022, 4:45 PM   Recent Labs  Lab 12/08/22 2032 12/09/22 0303 12/10/22 0424  HGB 9.5* 9.9* 8.8*  ALBUMIN 3.6 3.3*  --   CALCIUM 9.6 9.8 9.2  PHOS  --   --  6.9*  CREATININE 9.14* 10.03* 12.46*  K 5.0 4.9 5.8*   No results for input(s): "IRON", "TIBC", "FERRITIN" in the last 168 hours. Inpatient medications:  brimonidine  1 drop Both Eyes BID   calcitRIOL  1 mcg Oral Q T,Th,Sa-HD   Chlorhexidine Gluconate Cloth  6 each Topical Q0600   cinacalcet  30 mg Oral Q supper   docusate sodium  100 mg Oral TID   dorzolamide-timolol  1 drop Both Eyes BID   feeding supplement (KATE [employer]ARMS STANDARD 1.4)  325 mL Oral BID BM   latanoprost  1  drop Both Eyes QHS   multivitamin  1 tablet Oral QHS   sevelamer carbonate  1,600 mg Oral With snacks   sevelamer carbonate  4,000 mg Oral TID with meals    acetaminophen, HYDROmorphone (DILAUDID) injection, oxyCODONE

## 2022-12-10 NOTE — Progress Notes (Signed)
Patient is a complex patient his history is well-documented in the previous orthopedic consult notes.  Unfortunately he has multiple medical comorbidities including ESRD, blindness amongst others.  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     I have consulted with the orthopedic traumatologist as well as our orthopedic joint reconstruction surgeons in town who in the setting of a new unknown primary cancer and multiple lytic lesions involving the proximal femur on the left, pathologic fracture and multiple lytic lesions within the pelvis may need a reconstruction that is outside the scope of practice of community orthopedist.  I personally do not perform total hip arthroplasty and am unable to provide the service this patient needs.  I would recommend transfer to an orthopedic tertiary care center and likely multi disciplinary team to address his pathologic fracture, the needs of his pelvic lytic lesions and the possibility of a cemented hip arthroplasty implant if that is what is deemed necessary by his excepting team and workup for his mass in his iliopsoas as well as his cancer.  I am happy to discuss this with the orthopedic oncology team if there is question however this patient cannot be cared for adequately with any of the [facility] orthopedists.

## 2022-12-10 NOTE — Discharge Summary (Signed)
Physician Discharge Summary  Timothy Ponce BCW:[id number] DOB: [birth year] DOA: 12/08/2022  PCP: [facility]  Admit date: 12/08/2022 Discharge date: 12/10/2022 Admitted From: Home Disposition: [facility]   Discharge Condition: Stable CODE STATUS: Full code   Hospital course 66 year old M with PMH of blindness, cataract, glaucoma, ESRD on HD TTS and lytic bone lesion on x-ray presenting with left hip pain for 3 weeks.  No trauma or fall. Patient was seen in ED on 11/19/2022 with hip pain but x-ray was negative and he was discharged home.  However, he continued to have pain and went to see orthopedic surgery [facility]) on 12/04/2022.     CT left hip on 12/20 with acute nondisplaced pathologic fracture of left femoral neck concerning for metastatic disease or MM, lytic lesions, heterogeneous fluid collection with internal fat attenuation along the iliopsoas compatible with bursitis and nondisplaced fracture of the left parasymphyseal pubic bone and tiny chip fracture of the posterior acetabulum. CT chest/abdomen/pelvis with increased displacement of known pathologic fractures through the left femoral neck, multiple lytic lesions within the bilateral proximal femurs, proximal humeri and scapula compatible with multiple myeloma or metastatic disease, increased complex collection along the course of the distal left iliopsoas tendon measuring 8.7 x 4.4 x 4.8 cm favoring complex iliopsoas bursitis, mildly enlarged right axillary lymph node measuring 12 mm in short axis and enlarged multinodular left thyroid lobe extending into superior mediastinum.    Orthopedic surgery and nephrology consulted.  Orthopedic surgery, Dr. Ophelia Charter recommended transfer to [facility] for "Ortho oncology" team evaluation.  [facility] transfer center called.  Case discussed with Dr. Aretha Parrot (orthopedic surgery) and Dr. Marye Round.  The later accepted patient transfer to [facility].  See individual  problem list below for more.   Problems addressed during this hospitalization Principal Problem:   Closed left hip fracture, initial encounter [facility]) Active Problems:   ESRD on hemodialysis ([place])   Peripheral arterial disease ([place])   Glaucoma   Lytic bone lesions on xray   Thyroid nodule   Closed left hip fracture, initial encounter [facility]): concern for pathologic fracture due to underlying lytic bone lesions -CT left hip on 12/20 and CT chest/abdomen/pelvis on 12/24 as above. -Ortho, Dr. Griffin Basil recommended transfer to [facility] for Ortho oncology eval and management -Dr. Aretha Parrot, orthopedics at [facility] recommended medicine to medicine transfer to see patient in consultation -Dr. Marye Round at [facility] accepted patient transfer -Continue pain control -Checks SPEP -Follow-up parathyroid hormone level   Lytic bone lesions on imaging: CT as above.  Concern for metastatic disease or multiple myeloma but no known primary. -Check serum protein electrophoresis -May need inpatient PET scan.   ESRD on hemodialysis on HD TTS. -Nephrology on board.  Had HD earlier today.   Anemia of chronic renal failure: H&H slightly lower than baseline likely hemodilution. -Monitor H&H -Erythropoietin per nephrology.   Bone mineral disorder/secondary hyperparathyroidism -Continue Sensipar, Renvela   Glaucoma: Impaired vision -Continue home eyedrops or acute balance   Enlarged multinodular left thyroid lobe: Noted on CT chest and abdomen. -Nonemergent thyroid ultrasound outpatient   Increased nutrient needs  Nutrition Problem: Increased nutrient needs Etiology: hip fracture Signs/Symptoms: estimated needs Interventions: Other (Comment) ([place])     Vital signs Vitals:   12/10/22 1141 12/10/22 1216 12/10/22 1254 12/10/22 1455  BP: 122/86  (!) 155/82 (!) 169/87 Comment: nurse notified  Pulse: 93  92 (!) 107 Comment: nurse notified  Temp: 98.2 F (36.8 C)  99.3 F (37.4 C)  99.2 F (37.3 C)   Resp: 13     Height:      Weight:  59 kg    SpO2: 98%  99% 100%  TempSrc: Oral  Oral Oral  BMI (Calculated):  19.78       Discharge exam  GENERAL: No apparent distress.  Nontoxic. HEENT: MMM.  Hearing grossly intact.  Impaired vision. NECK: Supple.  No apparent JVD.  RESP:  No IWOB.  Fair aeration bilaterally. CVS:  RRR. Heart sounds normal.  ABD/GI/GU: BS+. Abd soft, NTND.  MSK/EXT:  Moves extremities.  Able to wiggle his toes. SKIN: no apparent skin lesion or wound NEURO: Awake, alert and oriented appropriately.  No apparent focal neuro deficit. PSYCH: Calm. Normal affect.     Discharge Instructions  Allergies as of 12/10/2022       Reactions   Iron Dextran Shortness Of Breath   Infed Iron Infusion    Darvon [propoxyphene] Other (See Comments)   Lungs filling up with fluid (reaction to Darvon/Darvocet)   Lactose Other (See Comments)   Gi upset/ sugar in milk   Milk (cow) Other (See Comments)   Gi upset     Med Rec must be completed prior to using this [facility]         Consultations: Orthopedic surgery Nephrology  Procedures/Studies:    CT CHEST ABDOMEN PELVIS WO CONTRAST  Result Date: 12/08/2022 CLINICAL DATA:  Metastatic disease evaluation. Lytic bone lesions with known pathologic fracture of the left femoral neck. EXAM: CT CHEST, ABDOMEN AND PELVIS WITHOUT CONTRAST TECHNIQUE: Multidetector CT imaging of the chest, abdomen and pelvis was performed following the standard protocol without IV contrast. RADIATION DOSE REDUCTION: This exam was performed according to the departmental dose-optimization program which includes automated exposure control, adjustment of the mA and/or kV according to patient size and/or use of iterative reconstruction technique. COMPARISON:  Hip CT 12/04/2022 FINDINGS: CT CHEST FINDINGS Cardiovascular: Heart size is normal. No pericardial effusion. Thoracic aorta is nonaneurysmal. Scattered atherosclerotic calcifications of the aorta  and coronary arteries. Pulmonary vasculature is nondilated. Right IJ approach central line terminates at the level of the distal SVC. Mediastinum/Nodes: Mildly enlarged right axillary lymph node measuring 12 mm short axis. Mildly prominent, although not pathologically enlarged left axillary lymph node. No mediastinal or hilar lymphadenopathy. Enlarged multinodular left thyroid lobe extending into the superior mediastinum. Trachea and esophagus within normal limits. Lungs/Pleura: Minimal linear atelectasis at the right lung base. The lungs are otherwise clear. No pulmonary nodules or masses. No airspace consolidation. No pleural effusion or pneumothorax. Musculoskeletal: Lytic lesions within the included proximal humeri and scapulae without evidence of a pathologic fracture. Left glenohumeral joint effusion. No well-defined lucent lesion of the thoracic spine or bilateral ribs. CT ABDOMEN PELVIS FINDINGS Hepatobiliary: Unremarkable unenhanced appearance of the liver. No focal lesion identified. Unremarkable gallbladder. Pancreas: Evaluation limited by lack of IV contrast and relative paucity of intra-fat. Within this limitation, there is no identifiable pancreatic abnormality or associated inflammatory changes. Spleen: Normal in size without focal abnormality. Adrenals/Urinary Tract: No adrenal gland nodules or masses are seen. Multi-cystic kidneys with associated parenchymal atrophy. Rim calcifications of the cysts bilaterally. No specific follow-up imaging recommended for the cysts. No hydronephrosis. Urinary bladder is decompressed. Stomach/Bowel: Stomach is within normal limits. Moderate volume of stool throughout the colon. No evidence of bowel wall thickening, distention, or inflammatory changes. Vascular/Lymphatic: Aortic atherosclerosis. No enlarged abdominal or pelvic lymph nodes. Reproductive: Prostate is unremarkable. Other: No free fluid. No abdominopelvic fluid collection. No pneumoperitoneum.  No  abdominal wall hernia. Musculoskeletal: Increased displacement of known pathologic fracture through the left femoral neck, which is now mildly displaced (series 2, image 113). Multiple lytic lesions within the bilateral proximal femurs. Cystic structures within the bilateral acetabula, which may represent lytic bone lesions or degenerative subchondral cysts. Inferior endplate Schmorl's node at L2. Schmorl's node or small lucent lesion in the L1 vertebral body. Vertebral body heights and alignment are maintained. Complex collection along the course of the distal left iliopsoas tendon has increased from prior now measuring approximately 8.7 x 4.4 x 4.8 cm (series 2, image 116), previously 6.9 x 2.2 x 2.9 cm. IMPRESSION: 1. Increased displacement of known pathologic fracture through the left femoral neck, which is now mildly displaced. 2. Multiple lytic lesions within the bilateral proximal femurs as well as the included proximal humeri and scapulae. No additional pathologic fractures. Findings remain most compatible with multiple myeloma or metastatic disease. 3. Complex collection along the course of the distal left iliopsoas tendon has increased from prior, now measuring approximately 8.7 x 4.4 x 4.8 cm, previously 6.9 x 2.2 x 2.9 cm. Findings are favored to represent a complex iliopsoas bursitis. 4. Mildly enlarged right axillary lymph node measuring 12 mm short axis, nonspecific. 5. Multi-cystic kidneys with associated parenchymal atrophy. 6. Enlarged multinodular left thyroid lobe extending into the superior mediastinum. Nonemergent thyroid ultrasound recommended. 7. Aortic and coronary artery atherosclerosis (ICD10-I70.0). Electronically Signed   By: Davina Poke D.O.   On: 12/08/2022 20:02   Chest Portable 1 View  Result Date: 12/08/2022 CLINICAL DATA:  Preop for hip fracture. EXAM: PORTABLE CHEST 1 VIEW COMPARISON:  10/28/2022. FINDINGS: Cardiac silhouette normal in size configuration. Normal  mediastinal and hilar contours. Clear lungs. Right anterior chest wall, internal jugular, tunneled dual lumen central venous catheter is stable. Skeletal structures are grossly intact. IMPRESSION: No active disease. Electronically Signed   By: Lajean Manes M.D.   On: 12/08/2022 15:57   CT HIP LEFT WO CONTRAST  Result Date: 12/04/2022 CLINICAL DATA:  Hip pain EXAM: CT OF THE LEFT HIP WITHOUT CONTRAST TECHNIQUE: Multidetector CT imaging of the left hip was performed according to the standard protocol. Multiplanar CT image reconstructions were also generated. RADIATION DOSE REDUCTION: This exam was performed according to the departmental dose-optimization program which includes automated exposure control, adjustment of the mA and/or kV according to patient size and/or use of iterative reconstruction technique. COMPARISON:  Hip radiograph 11/19/2022. FINDINGS: Bones/Joint/Cartilage There is an acute nondisplaced pathologic fracture of the left femoral neck, through multiple lytic lesions with adjacent cortical thinning. There is a lytic lesion in the greater trochanter with adjacent cortical thinning. 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 There is a small joint effusion. There is a small nondisplaced fracture of the left parasymphyseal pubic bone. Tiny chip fracture of the posterior acetabulum Ligaments Suboptimally assessed by CT. Muscles and Tendons There is a heterogeneous collection in the region of the iliopsoas bursa measuring up to 2.9 x 2.2 x 6.9 cm with some internal fat attenuation. Soft tissues Mild soft tissue swelling along the femoral neck. IMPRESSION: Acute nondisplaced pathologic fracture of the left femoral neck through underlying lytic lesions, highly concerning for metastatic disease or multiple myeloma. Recommend malignancy  workup including SPEP/UPEP. Additional lytic lesion in the greater trochanter and periarticular subchondral bone, some of which are potentially subchondral cysts in the setting of osteoarthritis, but  indeterminate with other lesions present. Heterogeneous fluid collection with internal fat attenuation along the iliopsoas, compatible with iliopsoas lipohemobursitis. Nondisplaced fracture of the left parasymphyseal pubic bone and tiny chip fracture of the posterior acetabulum. These results will be called to the ordering clinician or representative by the Radiologist Assistant, and communication documented in the PACS or [employer]. Electronically Signed   By: Maurine Simmering M.D.   On: 12/04/2022 12:10   DG Hip Unilat W or Wo Pelvis 2-3 Views Left  Result Date: 11/19/2022 CLINICAL DATA:  Hip pain EXAM: DG HIP (WITH OR WITHOUT PELVIS) 2-3V LEFT COMPARISON:  None Available. FINDINGS: There is no evidence of hip fracture or dislocation. There is no evidence of arthropathy or other focal bone abnormality. Peripheral vascular calcifications are present. IMPRESSION: 1. No acute osseous abnormality. Electronically Signed   By: Ronney Asters M.D.   On: 11/19/2022 16:55       The results of significant diagnostics from this hospitalization (including imaging, microbiology, ancillary and laboratory) are listed below for reference.     Microbiology: No results found for this or any previous visit (from the past 240 hour(s)).   Labs:  CBC: Recent Labs  Lab 12/08/22 2032 12/09/22 0303 12/10/22 0424  WBC 12.8* 13.4* 14.7*  NEUTROABS 10.1*  --   --   HGB 9.5* 9.9* 8.8*  HCT 29.7* 30.3* 27.3*  MCV 106.5* 104.5* 102.2*  PLT 143* 145* 120*   BMP &GFR Recent Labs  Lab 12/08/22 2032 12/09/22 0303 12/10/22 0424  NA 139 139 136  K 5.0 4.9 5.8*  CL 94* 92* 89*  CO2 31 32 32  GLUCOSE 109* 120* 94  BUN 40* 44* 70*  CREATININE 9.14* 10.03* 12.46*  CALCIUM 9.6 9.8 9.2  PHOS  --   --  6.9*    Estimated Creatinine Clearance: 4.8 mL/min (A) (by C-G formula based on SCr of 12.46 mg/dL (H)). Liver & Pancreas: Recent Labs  Lab 12/08/22 2032 12/09/22 0303  AST 22 25  ALT 19 20  ALKPHOS 99 88  BILITOT 0.9 0.7  PROT 7.4 6.8  ALBUMIN 3.6 3.3*   No results for input(s): "LIPASE", "AMYLASE" in the last 168 hours. No results for input(s): "AMMONIA" in the last 168 hours. Diabetic: No results for input(s): "HGBA1C" in the last 72 hours. No results for input(s): "GLUCAP" in the last 168 hours. Cardiac Enzymes: No results for input(s): "CKTOTAL", "CKMB", "CKMBINDEX", "TROPONINI" in the last 168 hours. No results for input(s): "PROBNP" in the last 8760 hours. Coagulation Profile: No results for input(s): "INR", "PROTIME" in the last 168 hours. Thyroid Function Tests: No results for input(s): "TSH", "T4TOTAL", "FREET4", "T3FREE", "THYROIDAB" in the last 72 hours. Lipid Profile: No results for input(s): "CHOL", "HDL", "LDLCALC", "TRIG", "CHOLHDL", "LDLDIRECT" in the last 72 hours. Anemia Panel: No results for input(s): "VITAMINB12", "FOLATE", "FERRITIN", "TIBC", "IRON", "RETICCTPCT" in the last 72 hours. Urine analysis: No results found for: "COLORURINE", "APPEARANCEUR", "LABSPEC", "PHURINE", "GLUCOSEU", "HGBUR", "BILIRUBINUR", "KETONESUR", "PROTEINUR", "UROBILINOGEN", "NITRITE", "LEUKOCYTESUR" Sepsis Labs: Invalid input(s): "PROCALCITONIN", "LACTICIDVEN"   SIGNED:  Mercy Riding, MD  [facility] 12/10/2022, 5:34 PM

## 2022-12-10 NOTE — Progress Notes (Signed)
Received patient in bed to unit.  Alert  Informed consent signed and in chart.   Treatment initiated: 0349 Treatment completed: 1141  Patient tolerated well.  Transported back to the room  Alert, without acute distress.  Hand-off given to patient's nurse.   Access used: catheter Access issues: n/a  Total UF removed: 2000 Medication(s) given: Calcitriol Post HD weight: 59.0 [facility]

## 2022-12-10 NOTE — Plan of Care (Addendum)
2:42 PM: Received call back from Dr. Michaelene Song (orthopedic surgery at [facility]).  Recommended transfer to medicine service/nephrology service to see patient in consultation.  4:47 PM: Received call back from Dr. Marye Round ([facility]) who is going to discuss patient with Dr. Michaelene Song and call back before accepting patient.  5:30 PM: Received a call from [facility] transfer center stating that Dr. Marye Round has accepted transfer.

## 2022-12-10 NOTE — Progress Notes (Addendum)
Gave report to [facility] nurse by name Oris Drone, and transportation team as well, current VSS, no complains, family member mother updated, IV on the right hand taken out , leaving the one on the left [facility] area intact since patient is being transferred to a hospital and he is hard stick as well, belongings packed and ready, will continue to monitor patent and all night scheduled meds given too

## 2022-12-10 NOTE — Progress Notes (Signed)
PROGRESS NOTE  Timothy Ponce OAC:[id number] DOB: 05/12/1956   PCP: [facility]  Patient is from: Home  DOA: 12/08/2022 LOS: 2  Chief complaints Chief Complaint  Patient presents with   Hip Pain     Brief Narrative / Interim history: 66 year old M with PMH of blindness, cataract, glaucoma, ESRD on HD TTS and lytic bone lesion on x-ray presenting with left hip pain for 3 weeks.  No trauma or fall. Patient was seen in ED on 11/19/2022 with hip pain but x-ray was negative and he was discharged home.  However, he continued to have pain and went to see orthopedic surgery [facility]) on 12/04/2022.    CT left hip on 12/20 with acute nondisplaced pathologic fracture of left femoral neck concerning for metastatic disease or MM, lytic lesions, heterogeneous fluid collection with internal fat attenuation along the iliopsoas compatible with bursitis and nondisplaced fracture of the left parasymphyseal pubic bone and tiny chip fracture of the posterior acetabulum. CT chest/abdomen/pelvis with increased displacement of known pathologic fractures through the left femoral neck, multiple lytic lesions within the bilateral proximal femurs, proximal humeri and scapula compatible with multiple myeloma or metastatic disease, increased complex collection along the course of the distal left iliopsoas tendon measuring 8.7 x 4.4 x 4.8 cm favoring complex iliopsoas bursitis, mildly enlarged right axillary lymph node measuring 12 mm in short axis and enlarged multinodular left thyroid lobe extending into superior mediastinum.   Orthopedic surgery and nephrology consulted.  Orthopedic surgery, Dr. Ophelia Charter recommended transfer to [facility] for Ortho oncology team evaluation.  [facility] called.  Nephrology following.  Subjective: Seen and examined earlier this morning while on HD.  No major events overnight of this morning.  Pain fairly controlled.  He rates his pain as "a little".   No other complaints.  Objective: Vitals:   12/10/22 1141 12/10/22 1216 12/10/22 1254 12/10/22 1455  BP: 122/86  (!) 155/82 (!) 169/87  Pulse: 93  92 (!) 107  Resp: 13     Temp: 98.2 F (36.8 C)  99.3 F (37.4 C) 99.2 F (37.3 C)  TempSrc: Oral  Oral Oral  SpO2: 98%  99% 100%  Weight:  59 kg    Height:        Examination:  GENERAL: No apparent distress.  Nontoxic. HEENT: MMM.  Hearing grossly intact.  Impaired vision. NECK: Supple.  No apparent JVD.  RESP:  No IWOB.  Fair aeration bilaterally. CVS:  RRR. Heart sounds normal.  ABD/GI/GU: BS+. Abd soft, NTND.  MSK/EXT:  Moves extremities.  Able to wiggle his toes. SKIN: no apparent skin lesion or wound NEURO: Awake, alert and oriented appropriately.  No apparent focal neuro deficit. PSYCH: Calm. Normal affect.   Procedures:  None  Microbiology summarized: None  Assessment and plan: Principal Problem:   Closed left hip fracture, initial encounter [facility]) Active Problems:   ESRD on hemodialysis ([place])   Peripheral arterial disease (HCC)   Glaucoma   Lytic bone lesions on xray   Thyroid nodule   Closed left hip fracture, initial encounter [facility]): concern for pathologic fracture due to underlying lytic bone lesions -CT left hip on 12/20 and CT chest/abdomen/pelvis on 12/24 as above. -Ortho, Dr. Griffin Basil recommended transfer to [place] for Ortho oncology eval and management -Dr. Aretha Parrot, orthopedics at [facility] recommended medicine to medicine transfer to see patient in consultation -Waiting on callback from inpatient team at [facility]. -Continue pain control -Checks SPEP -Follow-up parathyroid hormone level  Lytic  bone lesions on imaging: CT as above.  Concern for metastatic disease or multiple myeloma but no known primary. -Check serum protein electrophoresis -May need inpatient PET scan.   ESRD on hemodialysis on HD TTS. -Nephrology on board.  Had HD today.   Anemia of chronic renal failure: H&H slightly lower than baseline  likely hemodilution. -Monitor H&H -Erythropoietin per nephrology.   Bone mineral disorder/secondary hyperparathyroidism -Continue Sensipar, Renvela   Glaucoma: Impaired vision -Continue home eyedrops or acute balance  Enlarged multinodular left thyroid lobe: Noted on CT chest and abdomen. -Nonemergent thyroid ultrasound outpatient  Increased nutrient needs Body mass index is 19.78 kg/m. Nutrition Problem: Increased nutrient needs Etiology: hip fracture Signs/Symptoms: estimated needs Interventions: Other (Comment) ([place])   DVT prophylaxis:  SCDs Start: 12/08/22 1444  Code Status: Full code Family Communication: None at bedside Level of care: Med-Surg Status is: Inpatient Remains inpatient appropriate because: Left hip fracture   Final disposition: Possible transfer to [facility] Consultants:  Orthopedic surgery Nephrology  Sch Meds:  Scheduled Meds:  brimonidine  1 drop Both Eyes BID   calcitRIOL  1 mcg Oral Q T,Th,Sa-HD   Chlorhexidine Gluconate Cloth  6 each Topical Q0600   cinacalcet  30 mg Oral Q supper   docusate sodium  100 mg Oral TID   dorzolamide-timolol  1 drop Both Eyes BID   feeding supplement (KATE FARMS STANDARD 1.4)  325 mL Oral BID BM   latanoprost  1 drop Both Eyes QHS   multivitamin  1 tablet Oral QHS   sevelamer carbonate  1,600 mg Oral With snacks   sevelamer carbonate  4,000 mg Oral TID with meals   Continuous Infusions: PRN Meds:.acetaminophen, HYDROmorphone (DILAUDID) injection, oxyCODONE  Antimicrobials: Anti-infectives (From admission, onward)    None        I have personally reviewed the following labs and images: CBC: Recent Labs  Lab 12/08/22 2032 12/09/22 0303 12/10/22 0424  WBC 12.8* 13.4* 14.7*  NEUTROABS 10.1*  --   --   HGB 9.5* 9.9* 8.8*  HCT 29.7* 30.3* 27.3*  MCV 106.5* 104.5* 102.2*  PLT 143* 145* 120*   BMP &GFR Recent Labs  Lab 12/08/22 2032 12/09/22 0303 12/10/22 0424  NA 139 139 136  K 5.0  4.9 5.8*  CL 94* 92* 89*  CO2 31 32 32  GLUCOSE 109* 120* 94  BUN 40* 44* 70*  CREATININE 9.14* 10.03* 12.46*  CALCIUM 9.6 9.8 9.2  PHOS  --   --  6.9*   Estimated Creatinine Clearance: 4.8 mL/min (A) (by C-G formula based on SCr of 12.46 mg/dL (H)). Liver & Pancreas: Recent Labs  Lab 12/08/22 2032 12/09/22 0303  AST 22 25  ALT 19 20  ALKPHOS 99 88  BILITOT 0.9 0.7  PROT 7.4 6.8  ALBUMIN 3.6 3.3*   No results for input(s): "LIPASE", "AMYLASE" in the last 168 hours. No results for input(s): "AMMONIA" in the last 168 hours. Diabetic: No results for input(s): "HGBA1C" in the last 72 hours. No results for input(s): "GLUCAP" in the last 168 hours. Cardiac Enzymes: No results for input(s): "CKTOTAL", "CKMB", "CKMBINDEX", "TROPONINI" in the last 168 hours. No results for input(s): "PROBNP" in the last 8760 hours. Coagulation Profile: No results for input(s): "INR", "PROTIME" in the last 168 hours. Thyroid Function Tests: No results for input(s): "TSH", "T4TOTAL", "FREET4", "T3FREE", "THYROIDAB" in the last 72 hours. Lipid Profile: No results for input(s): "CHOL", "HDL", "LDLCALC", "TRIG", "CHOLHDL", "LDLDIRECT" in the last 72 hours. Anemia Panel: No  results for input(s): "VITAMINB12", "FOLATE", "FERRITIN", "TIBC", "IRON", "RETICCTPCT" in the last 72 hours. Urine analysis: No results found for: "COLORURINE", "APPEARANCEUR", "LABSPEC", "PHURINE", "GLUCOSEU", "HGBUR", "BILIRUBINUR", "KETONESUR", "PROTEINUR", "UROBILINOGEN", "NITRITE", "LEUKOCYTESUR" Sepsis Labs: Invalid input(s): "PROCALCITONIN", "LACTICIDVEN"  Microbiology: No results found for this or any previous visit (from the past 240 hour(s)).  Radiology Studies: No results found.    Trimaine Maser T. [place]  If 7PM-7AM, please contact night-coverage [email or web address] 12/10/2022, 3:02 PM

## 2022-12-11 LAB — PARATHYROID HORMONE, INTACT (NO CA): PTH: 312 pg/mL — ABNORMAL HIGH (ref 15–65)

## 2022-12-12 DIAGNOSIS — E859 Amyloidosis, unspecified: Secondary | ICD-10-CM

## 2022-12-12 HISTORY — DX: Amyloidosis, unspecified: E85.9

## 2022-12-13 ENCOUNTER — Ambulatory Visit: Payer: Medicare PPO | Admitting: Physician Assistant

## 2022-12-13 ENCOUNTER — Other Ambulatory Visit: Payer: Medicare PPO

## 2022-12-13 DIAGNOSIS — E859 Amyloidosis, unspecified: Secondary | ICD-10-CM | POA: Insufficient documentation

## 2022-12-13 LAB — PROTEIN ELECTROPHORESIS, SERUM
A/G Ratio: 1.1 (ref 0.7–1.7)
A/G Ratio: 1 (ref 0.7–1.7)
Albumin ELP: 3.5 g/dL (ref 2.9–4.4)
Albumin ELP: 3.2 g/dL (ref 2.9–4.4)
Alpha-1-Globulin: 0.2 g/dL (ref 0.0–0.4)
Alpha-1-Globulin: 0.3 g/dL (ref 0.0–0.4)
Alpha-2-Globulin: 0.7 g/dL (ref 0.4–1.0)
Alpha-2-Globulin: 0.8 g/dL (ref 0.4–1.0)
Beta Globulin: 0.8 g/dL (ref 0.7–1.3)
Beta Globulin: 0.7 g/dL (ref 0.7–1.3)
Gamma Globulin: 1.4 g/dL (ref 0.4–1.8)
Gamma Globulin: 1.4 g/dL (ref 0.4–1.8)
Globulin, Total: 3.1 g/dL (ref 2.2–3.9)
Globulin, Total: 3.2 g/dL (ref 2.2–3.9)
Total Protein ELP: 6.6 g/dL (ref 6.0–8.5)
Total Protein ELP: 6.4 g/dL (ref 6.0–8.5)

## 2022-12-24 DIAGNOSIS — I82890 Acute embolism and thrombosis of other specified veins: Secondary | ICD-10-CM | POA: Insufficient documentation

## 2022-12-24 DIAGNOSIS — M7070 Other bursitis of hip, unspecified hip: Secondary | ICD-10-CM | POA: Insufficient documentation

## 2022-12-24 DIAGNOSIS — I129 Hypertensive chronic kidney disease with stage 1 through stage 4 chronic kidney disease, or unspecified chronic kidney disease: Secondary | ICD-10-CM | POA: Insufficient documentation

## 2022-12-24 DIAGNOSIS — E559 Vitamin D deficiency, unspecified: Secondary | ICD-10-CM | POA: Insufficient documentation

## 2022-12-24 DIAGNOSIS — H269 Unspecified cataract: Secondary | ICD-10-CM | POA: Insufficient documentation

## 2022-12-24 DIAGNOSIS — H548 Legal blindness, as defined in USA: Secondary | ICD-10-CM | POA: Insufficient documentation

## 2022-12-24 DIAGNOSIS — E041 Nontoxic single thyroid nodule: Secondary | ICD-10-CM | POA: Insufficient documentation

## 2022-12-24 DIAGNOSIS — D62 Acute posthemorrhagic anemia: Secondary | ICD-10-CM | POA: Insufficient documentation

## 2022-12-24 DIAGNOSIS — M810 Age-related osteoporosis without current pathological fracture: Secondary | ICD-10-CM | POA: Insufficient documentation

## 2022-12-25 DIAGNOSIS — M6250 Muscle wasting and atrophy, not elsewhere classified, unspecified site: Secondary | ICD-10-CM | POA: Insufficient documentation

## 2022-12-25 DIAGNOSIS — R279 Unspecified lack of coordination: Secondary | ICD-10-CM | POA: Insufficient documentation

## 2022-12-30 DIAGNOSIS — M84459S Pathological fracture, hip, unspecified, sequela: Secondary | ICD-10-CM | POA: Insufficient documentation

## 2023-01-01 DIAGNOSIS — N189 Chronic kidney disease, unspecified: Secondary | ICD-10-CM | POA: Insufficient documentation

## 2023-01-07 DIAGNOSIS — E43 Unspecified severe protein-calorie malnutrition: Secondary | ICD-10-CM | POA: Insufficient documentation

## 2023-02-17 DIAGNOSIS — R0602 Shortness of breath: Secondary | ICD-10-CM | POA: Insufficient documentation

## 2023-02-25 ENCOUNTER — Emergency Department (HOSPITAL_COMMUNITY): Payer: Medicare PPO

## 2023-02-25 ENCOUNTER — Encounter (HOSPITAL_COMMUNITY): Payer: Self-pay

## 2023-02-25 ENCOUNTER — Other Ambulatory Visit: Payer: Self-pay

## 2023-02-25 ENCOUNTER — Emergency Department (HOSPITAL_COMMUNITY)
Admission: EM | Admit: 2023-02-25 | Discharge: 2023-02-26 | Disposition: A | Discharge status: Home / Self Care | Payer: Medicare PPO | Attending: Emergency Medicine | Admitting: Emergency Medicine

## 2023-02-25 DIAGNOSIS — G319 Degenerative disease of nervous system, unspecified: Secondary | ICD-10-CM | POA: Diagnosis not present

## 2023-02-25 DIAGNOSIS — Z992 Dependence on renal dialysis: Secondary | ICD-10-CM | POA: Diagnosis not present

## 2023-02-25 DIAGNOSIS — N186 End stage renal disease: Secondary | ICD-10-CM | POA: Diagnosis not present

## 2023-02-25 DIAGNOSIS — S8002XA Contusion of left knee, initial encounter: Secondary | ICD-10-CM | POA: Diagnosis not present

## 2023-02-25 DIAGNOSIS — W19XXXA Unspecified fall, initial encounter: Secondary | ICD-10-CM | POA: Diagnosis not present

## 2023-02-25 DIAGNOSIS — Z96642 Presence of left artificial hip joint: Secondary | ICD-10-CM | POA: Diagnosis not present

## 2023-02-25 DIAGNOSIS — S0990XA Unspecified injury of head, initial encounter: Secondary | ICD-10-CM | POA: Insufficient documentation

## 2023-02-25 DIAGNOSIS — S8992XA Unspecified injury of left lower leg, initial encounter: Secondary | ICD-10-CM | POA: Diagnosis present

## 2023-02-25 LAB — CBC WITH DIFFERENTIAL/PLATELET
Abs Immature Granulocytes: 0.02 10*3/uL (ref 0.00–0.07)
Basophils Absolute: 0 10*3/uL (ref 0.0–0.1)
Basophils Relative: 1 %
Eosinophils Absolute: 0.5 10*3/uL (ref 0.0–0.5)
Eosinophils Relative: 9 %
HCT: 28.1 % — ABNORMAL LOW (ref 39.0–52.0)
Hemoglobin: 8.8 g/dL — ABNORMAL LOW (ref 13.0–17.0)
Immature Granulocytes: 0 %
Lymphocytes Relative: 25 %
Lymphs Abs: 1.3 10*3/uL (ref 0.7–4.0)
MCH: 31.8 pg (ref 26.0–34.0)
MCHC: 31.3 g/dL (ref 30.0–36.0)
MCV: 101.4 fL — ABNORMAL HIGH (ref 80.0–100.0)
Monocytes Absolute: 0.5 10*3/uL (ref 0.1–1.0)
Monocytes Relative: 10 %
Neutro Abs: 2.9 10*3/uL (ref 1.7–7.7)
Neutrophils Relative %: 55 %
Platelets: 173 10*3/uL (ref 150–400)
RBC: 2.77 MIL/uL — ABNORMAL LOW (ref 4.22–5.81)
RDW: 15.6 % — ABNORMAL HIGH (ref 11.5–15.5)
WBC: 5.3 10*3/uL (ref 4.0–10.5)
nRBC: 0 % (ref 0.0–0.2)

## 2023-02-25 LAB — COMPREHENSIVE METABOLIC PANEL
ALT: 46 U/L — ABNORMAL HIGH (ref 0–44)
AST: 29 U/L (ref 15–41)
Albumin: 2.6 g/dL — ABNORMAL LOW (ref 3.5–5.0)
Alkaline Phosphatase: 96 U/L (ref 38–126)
Anion gap: 15 (ref 5–15)
BUN: 42 mg/dL — ABNORMAL HIGH (ref 8–23)
CO2: 27 mmol/L (ref 22–32)
Calcium: 8.5 mg/dL — ABNORMAL LOW (ref 8.9–10.3)
Chloride: 98 mmol/L (ref 98–111)
Creatinine, Ser: 9.46 mg/dL — ABNORMAL HIGH (ref 0.61–1.24)
GFR, Estimated: 6 mL/min — ABNORMAL LOW (ref >60)
Glucose, Bld: 92 mg/dL (ref 70–99)
Potassium: 5 mmol/L (ref 3.5–5.1)
Sodium: 140 mmol/L (ref 135–145)
Total Bilirubin: 0.2 mg/dL — ABNORMAL LOW (ref 0.3–1.2)
Total Protein: 5.9 g/dL — ABNORMAL LOW (ref 6.5–8.1)

## 2023-02-25 MED ORDER — HYDROCODONE-ACETAMINOPHEN 5-325 MG PO TABS: 2.0000 | ORAL_TABLET | Freq: Four times a day (QID) | ORAL | 0 refills | Status: DC | PRN

## 2023-02-25 MED ORDER — MORPHINE SULFATE (PF) 4 MG/ML IV SOLN
4.0000 mg | Freq: Once | INTRAVENOUS | Status: AC
  Administered 2023-02-25: 4 mg via INTRAVENOUS
  Filled 2023-02-25: qty 1

## 2023-02-25 NOTE — Progress Notes (Signed)
Orthopedic Tech Progress Note Patient Details:  Timothy Ponce [birth year] [id number]  Ortho Devices Type of Ortho Device: Knee Immobilizer Ortho Device/Splint Location: LLE Ortho Device/Splint Interventions: Application, Adjustment, Ordered   Post Interventions Patient Tolerated: Well Instructions Provided: Adjustment of device, Care of device  Jared Whorley OTR/L 02/25/2023, 7:24 PM

## 2023-02-25 NOTE — ED Notes (Signed)
Assisted patient to stand and pivot to wheelchair. Patient able to stand and pivot with use of Donzell Coller.

## 2023-02-25 NOTE — ED Notes (Signed)
Pt to Xray.

## 2023-02-25 NOTE — ED Notes (Signed)
[facility] called, no ETA 

## 2023-02-25 NOTE — ED Provider Notes (Signed)
[facility] Provider Note   CSN: [id number] Arrival date & time: 02/25/23  1531     History  Chief Complaint  Patient presents with   Fall   Knee Pain    Timothy Ponce is a 67 y.o. male.  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  He states he was walking with his walker on Sunday when his left knee hyperextended and buckled.  He fell backwards and landed on his buttocks.  Did not hit his head.  Does not take any blood thinners.  Did not lose consciousness.  He was able to get himself up off the ground afterwards and ambulate to his bed.  He then remained in his bed all day yesterday and today.  Has been taking Tylenol and hydrocodone for pain with moderate improvement.  Has been unable to bend the knee.  Has not attempted to ambulate since the fall.  He missed dialysis today due to the pain and being unable to get out of bed.  This is the reason for his visit today.  He denies numbness, weakness, tingling, right lower extremity pain, left ankle pain, left hip pain.  Also denies nausea, vomiting, abdominal pain, chest pain, shortness of breath.   Fall  Knee Pain      Home Medications Prior to Admission medications   Medication Sig Start Date End Date Taking? Authorizing Provider  HYDROcodone-acetaminophen (NORCO/VICODIN) 5-325 MG tablet Take 2 tablets by mouth every 6 (six) hours as needed. 02/25/23  Yes Ariea Rochin, [place], PA-C      Allergies    Iron dextran, Darvon [propoxyphene], Lactose, and Milk (cow)    Review of Systems   Review of Systems  Musculoskeletal:  Positive for arthralgias and joint swelling.  All other systems reviewed and are negative.   Physical Exam Updated Vital Signs BP (!) 145/80 (BP Location: Left Arm)   Pulse 68   Temp 98 F  (36.7 C)   Resp 16   SpO2 100%  Physical Exam Vitals and nursing note reviewed.  Constitutional:      General: He is not in acute distress.    Appearance: He is well-developed.  HENT:     Head: Normocephalic and atraumatic.     Mouth/Throat:     Mouth: Mucous membranes are moist.     Pharynx: Oropharynx is clear.  Eyes:     Conjunctiva/sclera: Conjunctivae normal.  Cardiovascular:     Rate and Rhythm: Normal rate and regular rhythm.     Pulses:          Dorsalis pedis pulses are 2+ on the right side and 2+ on the left side.     Heart sounds: No murmur heard. Pulmonary:     Effort: Pulmonary effort is normal. No respiratory distress.     Breath sounds: Normal breath sounds.  Abdominal:     Palpations: Abdomen is soft.     Tenderness: There is no abdominal tenderness. There is no guarding.  Musculoskeletal:     Cervical back: Neck supple.     Right lower leg: No edema.     Left lower leg: No edema.     Comments: Significant swelling to left knee.  Exquisitely painful with passive range of motion.  No left ankle or left hip pain.  Right lower extremity within normal  limits.  Skin:    General: Skin is warm and dry.     Capillary Refill: Capillary refill takes less than 2 seconds.  Neurological:     General: No focal deficit present.     Mental Status: He is alert and oriented to person, place, and time.  Psychiatric:        Mood and Affect: Mood normal.     ED Results / Procedures / Treatments   Labs (all labs ordered are listed, but only abnormal results are displayed) Labs Reviewed  CBC WITH DIFFERENTIAL/PLATELET - Abnormal; Notable for the following components:      Result Value   RBC 2.77 (*)    Hemoglobin 8.8 (*)    HCT 28.1 (*)    MCV 101.4 (*)    RDW 15.6 (*)    All other components within normal limits  COMPREHENSIVE METABOLIC PANEL - Abnormal; Notable for the following components:   BUN 42 (*)    Creatinine, Ser 9.46 (*)    Calcium 8.5 (*)    Total  Protein 5.9 (*)    Albumin 2.6 (*)    ALT 46 (*)    Total Bilirubin 0.2 (*)    GFR, Estimated 6 (*)    All other components within normal limits    EKG None  Radiology CT Head Wo Contrast  Result Date: 02/25/2023 CLINICAL DATA:  Head trauma EXAM: CT HEAD WITHOUT CONTRAST TECHNIQUE: Contiguous axial images were obtained from the base of the skull through the vertex without intravenous contrast. RADIATION DOSE REDUCTION: This exam was performed according to the departmental dose-optimization program which includes automated exposure control, adjustment of the mA and/or kV according to patient size and/or use of iterative reconstruction technique. COMPARISON:  None Available. FINDINGS: Brain: No acute territorial infarction, hemorrhage or intracranial mass. Mild atrophy. Nonenlarged ventricles. Vascular: No hyperdense vessels.  Carotid vascular calcification Skull: Normal. Negative for fracture or focal lesion. Sinuses/Orbits: No acute finding. Other: None IMPRESSION: No CT evidence for acute intracranial abnormality. Mild atrophy. Electronically Signed   By: Donavan Foil M.D.   On: 02/25/2023 22:49   DG Knee Complete 4 Views Left  Result Date: 02/25/2023 CLINICAL DATA:  Fall 2 days ago.  Hurts to band left knee. EXAM: LEFT KNEE - COMPLETE 4+ VIEW COMPARISON:  None Available. FINDINGS: Mild tricompartmental joint space narrowing. Minimal chronic enthesopathic spurring at the quadriceps insertion on the patella. Moderate soft tissue swelling anterior superior to the patella. Moderate joint effusion. No acute fracture is seen. No dislocation. Moderate to high-grade atherosclerotic calcifications. IMPRESSION: 1. Moderate soft tissue swelling anterior superior to the patella. 2. Moderate joint effusion. 3. No acute fracture. 4. Mild tricompartmental osteoarthritis. Electronically Signed   By: Yvonne Kendall M.D.   On: 02/25/2023 16:51    Procedures Procedures    Medications Ordered in  ED Medications  morphine (PF) 4 MG/ML injection 4 mg (4 mg Intravenous Given 02/25/23 1749)    ED Course/ Medical Decision Making/ A&P Clinical Course as of 02/25/23 2304  Tue Feb 25, 2023  2041 Patient was placed in a knee immobilizer.  He was able to ambulate in the ED with a walker.  Reports pain is improved.  Electrolytes within normal limits.  Will obtain a CT head and likely discharge home [AS]    Clinical Course User Index [AS] Roylene Reason, PA-C  Medical Decision Making Amount and/or Complexity of Data Reviewed Radiology: ordered.  Risk Prescription drug management.  This patient presents to the ED for concern of left knee pain, fall, missed dialysis, this involves an extensive number of treatment options, and is a complaint that carries with it a high risk of complications and morbidity.  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 EMS provides a portion of the history  I ordered, reviewed and interpreted labs which include: CMP, CBC  I ordered imaging studies including x-ray left knee I independently visualized and interpreted imaging which showed moderate soft tissue swelling and moderate effusion.  No acute fracture or dislocation I agree with the radiologist interpretation  Afebrile, hemodynamically stable.  67 year old male presents the ED for evaluation of left knee pain after a fall.  Physical exam shows a moderate amount of swelling to the left knee.  Exam is somewhat limited by pain, however there is no obvious joint space laxity.  X-ray  reveals soft tissue edema and a moderate effusion.  His neurovascular status is otherwise intact.  States he did miss his dialysis session today.  Electrolytes are within normal limits today.  He does have his next dialysis session on Thursday.  He was given a knee immobilizer and did ambulate in the ED after pain medication.  He is agreeable to being discharged home, however is concerned about what to do when the pain medication wears off.  He will be sent a small prescription for Norco and encouraged to only take these as needed to prevent further falls.  He is scheduled to have physical therapy come to his home within the next few days.  He was encouraged to follow-up with his orthopedic provider in 1 week if his symptoms do not improve.  He was given strict return precautions.  Stable at discharge.  At this time there does not appear to be any evidence of an acute emergency medical condition and the patient appears stable for discharge with appropriate outpatient follow up. Diagnosis was discussed with patient who verbalizes understanding of care plan and is agreeable to discharge. I have discussed return precautions with patient who verbalizes understanding. Patient encouraged to follow-up with their PCP within 1 week. All questions answered.  Patient's case discussed with Dr. Pearline Cables who agrees with plan to discharge with follow-up.   Note: Portions of this report may have been transcribed using voice recognition software. Every effort was made to ensure accuracy; however, inadvertent computerized transcription errors may still be present.        Final Clinical Impression(s) / ED Diagnoses Final diagnoses:  Contusion of left knee, initial encounter  Fall, initial encounter    Rx / DC Orders ED Discharge Orders          Ordered    HYDROcodone-acetaminophen (NORCO/VICODIN) 5-325 MG tablet  Every 6 hours PRN        02/25/23 2300              Roylene Reason, PA-C 02/25/23  2313    Jeanell Sparrow, DO 02/26/23 [phone]

## 2023-02-25 NOTE — ED Triage Notes (Signed)
Pt arrives via EMS. Pt just got home after being discharged from rehab last week (had a left hip fracture. Pt was using his walker and fell on Sunday. Pt c/o pain and swelling to left knee. Splint applied by EMS. Pt also missed dialysis today. Pt is legally blind. Pt denies any other injuries from the fall. Pt AxOx4.

## 2023-02-25 NOTE — ED Provider Triage Note (Signed)
Emergency Medicine Provider Triage Evaluation Note  Timothy Ponce , a 67 y.o. male  was evaluated in triage.  Pt complains of left knee pain.  Patient had a recent left hip replacement and was discharged from rehab last week.  Patient notes he fell on Sunday causing left knee pain.  Denies hip pain.  No head injury or loss of consciousness.  Hx of ESRD on dialysis Tuesday, Thursday, and Saturday.  Patient unable to go to dialysis today due to knee pain.  Last full session was on Saturday.  No shortness of breath.  Review of Systems  Positive: Knee pain Negative: Hip pain  Physical Exam  BP (!) 149/84   Pulse 70   Temp 97.7 F (36.5 C)   Resp 17   SpO2 100%  Gen:   Awake, no distress   Resp:  Normal effort  MSK:   Moves extremities without difficulty  Other:    Medical Decision Making  Medically screening exam initiated at 3:46 PM.  Appropriate orders placed.  Timothy Ponce was informed that the remainder of the evaluation will be completed by another provider, this initial triage assessment does not replace that evaluation, and the importance of remaining in the ED until their evaluation is complete.  [place], [place], [place] 02/25/23 [id number]

## 2023-02-25 NOTE — Discharge Instructions (Addendum)
You have been seen today for your complaint of left knee pain and fall. Your lab work was overall reassuring. Your imaging was reassuring and showed no abnormalities. Your discharge medications include Norco. This is an opioid pain medication. You should only take this medication as needed for severe pain. You should not drive, operate heavy machinery or make important decisions while taking this medication. You should use alternative methods for pain relief while taking this medication including stretching, gentle range of motion, and tylenol. Follow up with: Dr. Percell Miller.  He is an Doctor, general practice.  Call tomorrow to schedule an appointment for follow-up visit for left knee pain Please seek immediate medical care if you develop any of the following symptoms:  At this time there does not appear to be the presence of an emergent medical condition, however there is always the potential for conditions to change. Please read and follow the below instructions.  Do not take your medicine if  develop an itchy rash, swelling in your mouth or lips, or difficulty breathing; call 911 and seek immediate emergency medical attention if this occurs.  You may review your lab tests and imaging results in their entirety on your MyChart account.  Please discuss all results of fully with your primary care provider and other specialist at your follow-up visit.  Note: Portions of this text may have been transcribed using voice recognition software. Every effort was made to ensure accuracy; however, inadvertent computerized transcription errors may still be present.

## 2023-03-04 NOTE — Progress Notes (Signed)
I saw Timothy Ponce in neurology clinic on 03/07/23 in follow up for bilateral hand numbness and weakness.  HPI: Timothy Ponce is a 67 y.o. year old male with a history of ESRD on dialysis, legally blind, glaucoma, cataracts, and recently fractured left hip with lytic lesions who we last saw on 12/06/22.  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 He is having difficulty moving both hands. Patient notes he had no issues prior to fistula in right arm. Both hands have difficulty, but the right is progressing much faster. He has difficulty moving fingers at all. He occasionally has discomfort in his right hand. 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 He had another aneurysm and surgery one year prior.    He mentions that after dialysis his arm is wrapped tightly to stop bleeding. He would notice more symptoms during this time his wrapped. He also mentions a clamp that is put on his artery after dialysis. This is on for 20-25 minutes.   He had therapy a couple of years ago and does home exercises still daily. Despite this, his hands continue to get worse. About 1-2 years ago, patient mentioned symptoms. Had NCV/EMG/US at [facility] on 09/13/21. Don't have the results but per ortho note from 09/24/21, it showed right median mononeuropathy (no sensory or motor response), and ulnar at elbow on right. Per patient, he states his nerves were mainly checked on the left, and only the wrist on the right, so he disputes the interpretation. Enlargement of nerves was also seen on NMUS. He went to therapy per note. 01/23/22 note indicates that patient declined injections for trigger fingers. Patient mentions that he has been told he has cysts in the palm and left wrist.   Patient had a CT left hip on 12/04/22. He had this because he had a lot of pain and not being able to put weight on his left leg. CT showed acute  nondisplaced pathologic fracture of the left femoral neck through underlying lytic lesions, highly concerning for metastatic disease or multiple myeloma. Patient will be getting lab work soon.    He does not report any constitutional symptoms like fever, night sweats, anorexia or unintentional weight loss.   EtOH use: None  Restrictive diet? None  Most recent Assessment and Plan (12/06/22): His neurological examination is pertinent for bilateral hand weakness. Previous EMG suggested a right median and ulnar neuropathy per ortho notes, but patient is adamant that extensive testing was done on his left side not the right due to inability to safely test the right due to fistula. The etiology of patient's symptoms is unclear, but there does seem to be distal weakness in multiple nerve distributions including the median, ulnar, and radial branches. 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 Monoclonal gammopathy causing a neuropathy is possible but his symptoms far predate this, so is not likely related. We discussed work up including repeating EMG with the caveat that his extensive surgeries and deformities in the RUE will make testing limited and difficult. Patient decided that he would like to focus on malignancy work up and hip fracture for now, perhaps revisiting further work up for hands in a few months, which seems very reasonable.   PLAN: -Blood work: Will need multiple myeloma work up - patient states he will be getting this in dialysis through nephrology in the coming days. -Discussed EMG,  will defer for now until malignancy work up and left hip is better  Since their last visit: Patient had extreme pain in hip on 12/08/22 and was going to the ED when he noticed no feeling of the left thigh. Patient then had hip replacement on 12/12/22.   He went to rehab in [facility] after his surgery. He was not walking well due to left leg not being able to move. A few weeks after surgery,  the movement improved in the leg. It has improved some. He was able to walk with a walker if the leg is straight. If he bends the leg, he cannot hold weight. Patient was trying to work out and get his hip strong about 3 weeks ago. He fell and had a left knee contusion with fluid on the knee. Bending the knee currently causes a lot of pain.  Regarding his arm symptoms that he was previously seen for, the symptoms are about the same. Patient continues to do exercises that were given by therapy. When he has excess fluid, he has excess fluid and more difficulty moving his hands.   His left leg is more concerning to him currently.  He is getting work up for lytic lesions of the hip at [facility]. 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 They told him that is was not caused by cancer, and likely related to dialysis. They are currently monitoring. He had thyroid biopsy as well, not likely cancer.   MEDICATIONS:  Outpatient Encounter Medications as of 03/07/2023  Medication Sig   acetaminophen (TYLENOL) 650 MG CR tablet Take 650 mg by mouth every 8 (eight) hours as needed for pain.   brimonidine (ALPHAGAN) 0.2 % ophthalmic solution Place 1 drop into both eyes 3 (three) times daily.   cinacalcet (SENSIPAR) 30 MG tablet Take 30 mg by mouth daily.   Docusate Sodium (DSS) 100 MG CAPS Take 100 mg by mouth as needed (prn).   dorzolamide-timolol (COSOPT) 2-0.5 % ophthalmic solution Place 1 drop into both eyes 2 (two) times daily.   Lactase (LACTOSE INTOLERANCE PO) Take 4 oz by mouth as needed.   latanoprost (XALATAN) 0.005 % ophthalmic solution Place 1 drop into both eyes at bedtime.   RHOPRESSA 0.02 % SOLN Place 1 drop into both eyes daily.   sevelamer (RENAGEL) 800 MG tablet Take 800 mg by mouth 3 (three) times daily with meals.   HYDROcodone-acetaminophen (NORCO/VICODIN) 5-325 MG tablet Take 2 tablets by mouth every 6 (six) hours as needed. (Patient not taking: Reported on 03/07/2023)   No  facility-administered encounter medications on file as of 03/07/2023.    PAST MEDICAL HISTORY: Past Medical History:  Diagnosis Date   Cataract    Complication of anesthesia    "Awakens during surgery"   Dyspnea    ESRD (end stage renal disease) ([facility])    TTHSAT, [address]    Glaucoma    Neuropathy    hands - per patient    PAST SURGICAL HISTORY: Past Surgical History:  Procedure Laterality Date   AMPUTATION Right 06/02/2020   Procedure: RIGHT FOOT 5TH RAY AMPUTATION;  Surgeon: Newt Minion, MD;  Location: [facility];  Service: Orthopedics;  Laterality: Right;   AV FISTULA PLACEMENT     EYE SURGERY     cataract bilateral   EYE SURGERY Right    FISTULA SUPERFICIALIZATION Right 10/28/2022   Procedure: RIGHT ARM ARTERIOVENOUS FISTULA REVISION WITH COMPLEX SKIN AND SOFT TISSUE CLOSURE;  Surgeon: Broadus John, MD;  Location: [facility];  Service: Vascular;  Laterality: Right;   INSERTION OF DIALYSIS CATHETER Right 12/08/2020   Procedure: INSERTION OF DIALYSIS CATHETER USING 19cm PALINDROME CHRONIC CATHETER;  Surgeon: Waynetta Sandy, MD;  Location: [facility];  Service: Vascular;  Laterality: Right;   INSERTION OF DIALYSIS CATHETER Right 10/28/2022   Procedure: INSERTION OF RIGHT INTERNAL JUGULAR TUNNELED DIALYSIS CATHETER;  Surgeon: Broadus John, MD;  Location: [facility];  Service: Vascular;  Laterality: Right;   REVISON OF ARTERIOVENOUS FISTULA Right 12/06/2020   Procedure: REVISON OF RIGHT ARM FISTULA;  Surgeon: Rosetta Posner, MD;  Location: [facility];  Service: Vascular;  Laterality: Right;   TOTAL HIP ARTHROPLASTY Right     ALLERGIES: Allergies  Allergen Reactions   Iron Dextran Shortness Of Breath    Infed Iron Infusion    Darvon [Propoxyphene] Other (See Comments)    Lungs filling up with fluid (reaction to Darvon/Darvocet)     Lactose Other (See Comments)    Gi upset/ sugar in milk   Milk (Cow) Other (See Comments)    Gi upset    FAMILY HISTORY: History  reviewed. No pertinent family history.  SOCIAL HISTORY: Social History   Tobacco Use   Smoking status: Never    Passive exposure: Never   Smokeless tobacco: Never  Vaping Use   Vaping Use: Never used  Substance Use Topics   Alcohol use: Never   Drug use: Never   Social History   Social History Narrative   Are you right handed or left handed? Right handed    Are you currently employed ? no   What is your current occupation?   Do you live at home alone? no   Who lives with you? Lives with mother   What type of home do you live in: 1 story or 2 story?         Objective:  Vital Signs:  BP 131/76   Ht 5\' 8"  (1.727 m)   Wt 127 lb (57.6 kg)   BMI 19.31 kg/m   General: General appearance: Awake and alert. No distress. Cooperative with exam. Sitting in wheelchair. Extremities: Bilateral fingers are tight with restricted range of motion. Extensive fistula on right; less extensive surgical changes on left. Right pinkie toe amputated. Has brace from left thigh to ankle.  Neurological: Mental Status: Alert. Speech fluent. No pseudobulbar affect Cranial Nerves: CNII: Poor vision. CNIII, IV, VI: Pupils equal, sluggish  CN V: Facial sensation intact bilaterally to fine touch. CN VII: Facial muscles symmetric and strong. No ptosis at rest. CN VIII: Hears finger rub well bilaterally. CN IX: No hypophonia. CN X: Palate elevates symmetrically. CN XI: Full strength shoulder shrug bilaterally. CN XII: Tongue protrusion full and midline. No atrophy or fasciculations. No significant dysarthria Motor: Tone is normal.  Individual muscle group testing (MRC grade out of 5):  Movement     Neck flexion 5    Neck extension 5     Right Left   Shoulder abduction 5 5   Elbow flexion 5- 5-   Elbow extension 5- 5-   Finger abduction - FDI 3 3   Finger abduction - ADM 3 3   Finger extension 4+ 4+   Finger distal flexion - 2/3 4 4    Finger distal flexion - 4/5 4 4    Thumb flexion -  FPL 4 4   Thumb abduction - APB 3 3    Hip flexion 5 0 Limited due to knee brace and pain  Hip extension 5 0   Hip adduction 5 0   Hip abduction 5 0   Knee extension 5 0   Knee flexion 5 0   Dorsiflexion 5 5   Plantarflexion 5 5     Reflexes:  Right Left  Bicep 2+ 2+  Tricep 2+ 2+  BrRad 2+ 2+  Knee 2+ ---  Ankle 1+ ---   Sensation: Pinprick: Intact except not felt on left thigh Gait: Currently unable to ambulate   Lab and Test Review: New results: CBC (02/25/23): significant for chronic anemia and elevated MCV (101.4)  External labs: 12/11/22: IFE: No definite M spike Kappa/lambda ratio wnl (both elevated) A1c: 5.4 TSH: 1.41 Vit D: < 8  CT head wo contrast (02/25/23): FINDINGS: Brain: No acute territorial infarction, hemorrhage or intracranial mass. Mild atrophy. Nonenlarged ventricles.   Vascular: No hyperdense vessels.  Carotid vascular calcification   Skull: Normal. Negative for fracture or focal lesion.   Sinuses/Orbits: No acute finding.   Other: None   IMPRESSION: No CT evidence for acute intracranial abnormality. Mild atrophy.  CT chest abdomen pelvis (12/08/22): IMPRESSION: 1. Increased displacement of known pathologic fracture through the left femoral neck, which is now mildly displaced. 2. Multiple lytic lesions within the bilateral proximal femurs as well as the included proximal humeri and scapulae. No additional pathologic fractures. Findings remain most compatible with multiple myeloma or metastatic disease. 3. Complex collection along the course of the distal left iliopsoas tendon has increased from prior, now measuring approximately 8.7 x 4.4 x 4.8 cm, previously 6.9 x 2.2 x 2.9 cm. Findings are favored to represent a complex iliopsoas bursitis. 4. Mildly enlarged right axillary lymph node measuring 12 mm short axis, nonspecific. 5. Multi-cystic kidneys with associated parenchymal atrophy. 6. Enlarged multinodular left thyroid lobe  extending into the superior mediastinum. Nonemergent thyroid ultrasound recommended. 7. Aortic and coronary artery atherosclerosis (ICD10-I70.0).  Previously reviewed results: 11/19/22: BMP significant for elevated glucose (123), Cr (6.84) CBC significant for anemia (Hb 8.6 - chronic), 159 platelets   B12 (05/31/20): 1672 A1c (05/31/20): 4.9 HIV (05/30/20): non-reactive   CT hip left wo contrast (12/04/22): FINDINGS: Bones/Joint/Cartilage   There is an acute nondisplaced pathologic fracture of the left femoral neck, through multiple lytic lesions with adjacent cortical thinning. There is a lytic lesion in the greater trochanter with adjacent cortical thinning. 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 There is a small joint effusion.   There is a small nondisplaced fracture of the left parasymphyseal pubic bone. Tiny chip fracture of the posterior acetabulum   Ligaments   Suboptimally assessed by CT.   Muscles and Tendons   There is a heterogeneous collection in the region of the iliopsoas bursa measuring up to 2.9 x 2.2 x 6.9 cm with some internal fat attenuation.   Soft tissues   Mild soft tissue swelling along the femoral neck.   IMPRESSION: Acute nondisplaced pathologic fracture of the left femoral neck through underlying lytic lesions, highly concerning for metastatic disease or multiple myeloma. Recommend malignancy workup including SPEP/UPEP.   Additional lytic lesion in the greater trochanter and periarticular subchondral bone, some of which are potentially subchondral cysts in the setting of osteoarthritis, but indeterminate with other lesions present.   Heterogeneous fluid collection with internal fat attenuation along the iliopsoas, compatible with iliopsoas lipohemobursitis.   Nondisplaced  fracture of the left parasymphyseal pubic bone and  tiny chip fracture of the posterior acetabulum.  ASSESSMENT: This is Timothy Ponce, a 67 y.o. male with bilateral hand weakness and numbness, now with new onset left thigh numbness and left leg weakness since 11/2022. Numbness began prior to hip replacement surgery and weakness after the surgery. The etiology of the new left leg symptoms are not clear, but may localize to root, plexus, or individual nerve (femoral?).   Regarding his arms, previous EMG suggested a right median and ulnar neuropathy per ortho notes, but patient is adamant that extensive testing was done on his left side not the right due to inability to safely test the right due to fistula. The etiology of patient's symptoms is unclear, but there does seem to be distal weakness in multiple nerve distributions including the median, ulnar, and radial branches. 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 We previously discussed work up including repeating EMG with the caveat that his extensive surgeries and deformities in the RUE will make testing limited and difficult.   Patient preferred to focus on the left leg for now and defer the planned work up for the arms for now.  Plan: -EMG: LLE -Will explore arms at a later date  Return to clinic in 6 months  Total time spent reviewing records, interview, history/exam, documentation, and coordination of care on day of encounter:  40 min  Kai Levins, MD

## 2023-03-07 ENCOUNTER — Encounter: Payer: Self-pay | Admitting: Neurology

## 2023-03-07 ENCOUNTER — Ambulatory Visit: Payer: Medicare PPO | Admitting: Neurology

## 2023-03-07 VITALS — BP 131/76 | Ht 68.0 in | Wt 127.0 lb

## 2023-03-07 DIAGNOSIS — M79642 Pain in left hand: Secondary | ICD-10-CM | POA: Diagnosis not present

## 2023-03-07 DIAGNOSIS — M79641 Pain in right hand: Secondary | ICD-10-CM | POA: Diagnosis not present

## 2023-03-07 DIAGNOSIS — R200 Anesthesia of skin: Secondary | ICD-10-CM

## 2023-03-07 DIAGNOSIS — R29898 Other symptoms and signs involving the musculoskeletal system: Secondary | ICD-10-CM

## 2023-03-07 NOTE — Patient Instructions (Signed)
I want to do an EMG of your left leg. We will look at your arms at a later date.  ELECTROMYOGRAM AND NERVE CONDUCTION STUDIES (EMG/NCS) INSTRUCTIONS  How to Prepare The neurologist conducting the EMG will need to know if you have certain medical conditions. Tell the neurologist and other EMG lab personnel if you: Have a pacemaker or any other electrical medical device Take blood-thinning medications Have hemophilia, a blood-clotting disorder that causes prolonged bleeding Bathing Take a shower or bath shortly before your exam in order to remove oils from your skin. Don't apply lotions or creams before the exam.  What to Expect You'll likely be asked to change into a hospital gown for the procedure and lie down on an examination table. The following explanations can help you understand what will happen during the exam.  Electrodes. The neurologist or a technician places surface electrodes at various locations on your skin depending on where you're experiencing symptoms. Or the neurologist may insert needle electrodes at different sites depending on your symptoms.  Sensations. The electrodes will at times transmit a tiny electrical current that you may feel as a twinge or spasm. The needle electrode may cause discomfort or pain that usually ends shortly after the needle is removed. If you are concerned about discomfort or pain, you may want to talk to the neurologist about taking a short break during the exam.  Instructions. During the needle EMG, the neurologist will assess whether there is any spontaneous electrical activity when the muscle is at rest - activity that isn't present in healthy muscle tissue - and the degree of activity when you slightly contract the muscle.  He or she will give you instructions on resting and contracting a muscle at appropriate times. Depending on what muscles and nerves the neurologist is examining, he or she may ask you to change positions during the exam.  After  your EMG You may experience some temporary, minor bruising where the needle electrode was inserted into your muscle. This bruising should fade within several days. If it persists, contact your primary care doctor.    I will see you back in clinic in about 6 months.  The physicians and staff at [facility] are committed to providing excellent care. You may receive a survey requesting feedback about your experience at our office. We strive to receive "very good" responses to the survey questions. If you feel that your experience would prevent you from giving the office a "very good " response, please contact our office to try to remedy the situation. We may be reached at [phone]. Thank you for taking the time out of your busy day to complete the survey.  Kai Levins, MD [facility]

## 2023-04-07 ENCOUNTER — Ambulatory Visit: Payer: Medicare PPO | Admitting: Neurology

## 2023-04-07 DIAGNOSIS — M79641 Pain in right hand: Secondary | ICD-10-CM | POA: Diagnosis not present

## 2023-04-07 DIAGNOSIS — R200 Anesthesia of skin: Secondary | ICD-10-CM

## 2023-04-07 DIAGNOSIS — M79642 Pain in left hand: Secondary | ICD-10-CM

## 2023-04-07 DIAGNOSIS — R29898 Other symptoms and signs involving the musculoskeletal system: Secondary | ICD-10-CM

## 2023-04-07 NOTE — Procedures (Signed)
[facility]  [address] [place], [address]  [place], [place] [address] Tel: [phone] Fax: [phone] Test Date:  04/07/2023  Patient: Timothy Ponce DOB: 1955/12/29 Physician: Jacquelyne Balint, MD  Sex: Male Height:  Ref Phys: Jacquelyne Balint, MD  ID#: [id number]   Technician:    History: This is a 67 year old male with left leg weakness and numbness.  NCV & EMG Findings: Extensive electrodiagnostic evaluation of the left lower limb shows: Left sural and superficial peroneal/fibular sensory responses are within normal limits. Left peroneal/fibular (EDB) motor response shows no response. Left tibial (AH) motor response shows reduced amplitude (1.99 mV). Left peroneal/fibular (TA) motor response is within normal limits. Left H reflex is absent. Left rectus femoris and vastus lateralis are unable to be activated and therefore show no motor units. There is no spontaneous activity seen in these muscles. All other tested muscles are within normal limits with normal motor unit configuration and recruitment patterns.  Impression: This is a complex electrodiagnostic study. The findings are: No activation or motor units are obtained from left quadriceps muscles (rectus femoris and vastus lateralis). There is no abnormal spontaneous activity and iliacus and adductor longus are normal. The lack of activation of the left quadriceps in isolation is of unclear significance and too limited in distribution for diagnostic purposes. Absent and low amplitude nerve conduction studies of the intrinsic foot muscles in isolation are of unclear clinical significance and can be seen as a result of chronic repetitive local trauma from footwear, among other possibilities. No electrodiagnostic evidence of a large fiber sensorimotor neuropathy. No electrodiagnostic evidence of a left lumbosacral plexopathy or left lumbosacral (L2-S1) radiculopathy.    ___________________________ Jacquelyne Balint,  MD    Nerve Conduction Studies Motor Nerve Results    Latency Amplitude F-Lat Segment Distance CV Comment  Site (ms) Norm (mV) Norm (ms)  (cm) (m/s) Norm   Left Fibular (EDB) Motor  Ankle *NR  < 6.0 *NR  > 2.5        Bel fib head *NR - *NR -  Bel fib head-Ankle - *NR  > 40   Pop fossa *NR - *NR -  Pop fossa-Bel fib head - *NR -   Left Fibular (TA) Motor  Fib head 3.0  < 4.5 3.4  > 3.0        Pop fossa 5.3  < 6.7 3.0 -  Pop fossa-Fib head 10 43  > 40   Left Tibial (AH) Motor  Ankle 5.9  < 6.0 *1.99  > 4.0        Knee 16.6 - 1.60 -  Knee-Ankle 44 41  > 40    Sensory Sites    Neg Peak Lat Amplitude (O-P) Segment Distance Velocity Comment  Site (ms) Norm (V) Norm  (cm) (ms)   Left Superficial Fibular Sensory  14 cm-Ankle 3.6  < 4.6 4  > 3 14 cm-Ankle 14    Left Sural Sensory  Calf-Lat mall 3.5  < 4.6 5  > 3 Calf-Lat mall 12     H-Reflex Results    M-Lat H Lat H Neg Amp H-M Lat  Site (ms) (ms) Norm (mV) (ms)  Left Tibial H-Reflex  Pop fossa 7.7 -  < 35.0 - -   Electromyography   Side Muscle Ins.Act Fibs Fasc Recrt Amp Dur Poly Activation Comment  Left Tib ant Nml Nml Nml Nml Nml Nml Nml Nml N/A  Left Gastroc MH Nml Nml Nml Nml Nml Nml Nml Nml  N/A  Left Rectus fem *1+ Nml Nml *None *- *- *- *N.E. N/A  Left Vastus lat. Nml Nml Nml *None *- *- *- *N.E. N/A  Left Add longus Nml Nml Nml Nml Nml Nml Nml Nml N/A  Left Iliacus Nml Nml Nml Nml Nml Nml Nml Nml N/A  Left Gluteus med Nml Nml Nml Nml Nml Nml Nml Nml N/A      Waveforms:  Motor        Sensory      H-Reflex

## 2023-04-08 ENCOUNTER — Other Ambulatory Visit: Payer: Self-pay

## 2023-04-08 ENCOUNTER — Telehealth: Payer: Self-pay | Admitting: Neurology

## 2023-04-08 DIAGNOSIS — Z96642 Presence of left artificial hip joint: Secondary | ICD-10-CM

## 2023-04-08 DIAGNOSIS — R29898 Other symptoms and signs involving the musculoskeletal system: Secondary | ICD-10-CM

## 2023-04-08 NOTE — Telephone Encounter (Signed)
Called patient to discuss results of EMG from yesterday (04/07/23). It showed no activation of the left quadriceps muscles, but no clear neurogenic changes. We discussed that given he had femur fracture and left hip replacement, I would like to get an MRI of left hip to determine if there is a cause that would explain his inability to activate these muscles. Patient agreed.  All questions were answered.  Jacquelyne Balint, MD [facility]

## 2023-04-16 DIAGNOSIS — M84452D Pathological fracture, left femur, subsequent encounter for fracture with routine healing: Secondary | ICD-10-CM | POA: Diagnosis not present

## 2023-04-17 DIAGNOSIS — N2581 Secondary hyperparathyroidism of renal origin: Secondary | ICD-10-CM | POA: Diagnosis not present

## 2023-04-17 DIAGNOSIS — Z992 Dependence on renal dialysis: Secondary | ICD-10-CM | POA: Diagnosis not present

## 2023-04-17 DIAGNOSIS — N186 End stage renal disease: Secondary | ICD-10-CM | POA: Diagnosis not present

## 2023-04-19 DIAGNOSIS — N2581 Secondary hyperparathyroidism of renal origin: Secondary | ICD-10-CM | POA: Diagnosis not present

## 2023-04-19 DIAGNOSIS — Z992 Dependence on renal dialysis: Secondary | ICD-10-CM | POA: Diagnosis not present

## 2023-04-19 DIAGNOSIS — N186 End stage renal disease: Secondary | ICD-10-CM | POA: Diagnosis not present

## 2023-04-21 ENCOUNTER — Encounter: Payer: Medicare PPO | Admitting: Neurology

## 2023-04-22 DIAGNOSIS — N186 End stage renal disease: Secondary | ICD-10-CM | POA: Diagnosis not present

## 2023-04-22 DIAGNOSIS — N2581 Secondary hyperparathyroidism of renal origin: Secondary | ICD-10-CM | POA: Diagnosis not present

## 2023-04-22 DIAGNOSIS — Z992 Dependence on renal dialysis: Secondary | ICD-10-CM | POA: Diagnosis not present

## 2023-04-24 DIAGNOSIS — N2581 Secondary hyperparathyroidism of renal origin: Secondary | ICD-10-CM | POA: Diagnosis not present

## 2023-04-24 DIAGNOSIS — N186 End stage renal disease: Secondary | ICD-10-CM | POA: Diagnosis not present

## 2023-04-24 DIAGNOSIS — Z992 Dependence on renal dialysis: Secondary | ICD-10-CM | POA: Diagnosis not present

## 2023-04-26 DIAGNOSIS — N186 End stage renal disease: Secondary | ICD-10-CM | POA: Diagnosis not present

## 2023-04-26 DIAGNOSIS — Z992 Dependence on renal dialysis: Secondary | ICD-10-CM | POA: Diagnosis not present

## 2023-04-26 DIAGNOSIS — N2581 Secondary hyperparathyroidism of renal origin: Secondary | ICD-10-CM | POA: Diagnosis not present

## 2023-04-28 DIAGNOSIS — Z09 Encounter for follow-up examination after completed treatment for conditions other than malignant neoplasm: Secondary | ICD-10-CM | POA: Diagnosis not present

## 2023-04-28 DIAGNOSIS — Z471 Aftercare following joint replacement surgery: Secondary | ICD-10-CM | POA: Diagnosis not present

## 2023-04-28 DIAGNOSIS — Z96642 Presence of left artificial hip joint: Secondary | ICD-10-CM | POA: Diagnosis not present

## 2023-04-29 DIAGNOSIS — Z992 Dependence on renal dialysis: Secondary | ICD-10-CM | POA: Diagnosis not present

## 2023-04-29 DIAGNOSIS — N186 End stage renal disease: Secondary | ICD-10-CM | POA: Diagnosis not present

## 2023-04-29 DIAGNOSIS — N2581 Secondary hyperparathyroidism of renal origin: Secondary | ICD-10-CM | POA: Diagnosis not present

## 2023-05-03 DIAGNOSIS — N186 End stage renal disease: Secondary | ICD-10-CM | POA: Diagnosis not present

## 2023-05-03 DIAGNOSIS — Z992 Dependence on renal dialysis: Secondary | ICD-10-CM | POA: Diagnosis not present

## 2023-05-03 DIAGNOSIS — N2581 Secondary hyperparathyroidism of renal origin: Secondary | ICD-10-CM | POA: Diagnosis not present

## 2023-05-06 DIAGNOSIS — N186 End stage renal disease: Secondary | ICD-10-CM | POA: Diagnosis not present

## 2023-05-06 DIAGNOSIS — Z992 Dependence on renal dialysis: Secondary | ICD-10-CM | POA: Diagnosis not present

## 2023-05-06 DIAGNOSIS — N2581 Secondary hyperparathyroidism of renal origin: Secondary | ICD-10-CM | POA: Diagnosis not present

## 2023-05-08 DIAGNOSIS — N2581 Secondary hyperparathyroidism of renal origin: Secondary | ICD-10-CM | POA: Diagnosis not present

## 2023-05-08 DIAGNOSIS — N186 End stage renal disease: Secondary | ICD-10-CM | POA: Diagnosis not present

## 2023-05-08 DIAGNOSIS — Z992 Dependence on renal dialysis: Secondary | ICD-10-CM | POA: Diagnosis not present

## 2023-05-10 DIAGNOSIS — N2581 Secondary hyperparathyroidism of renal origin: Secondary | ICD-10-CM | POA: Diagnosis not present

## 2023-05-10 DIAGNOSIS — Z992 Dependence on renal dialysis: Secondary | ICD-10-CM | POA: Diagnosis not present

## 2023-05-10 DIAGNOSIS — N186 End stage renal disease: Secondary | ICD-10-CM | POA: Diagnosis not present

## 2023-05-13 DIAGNOSIS — N2581 Secondary hyperparathyroidism of renal origin: Secondary | ICD-10-CM | POA: Diagnosis not present

## 2023-05-13 DIAGNOSIS — N186 End stage renal disease: Secondary | ICD-10-CM | POA: Diagnosis not present

## 2023-05-13 DIAGNOSIS — Z992 Dependence on renal dialysis: Secondary | ICD-10-CM | POA: Diagnosis not present

## 2023-05-15 DIAGNOSIS — Z992 Dependence on renal dialysis: Secondary | ICD-10-CM | POA: Diagnosis not present

## 2023-05-15 DIAGNOSIS — N2581 Secondary hyperparathyroidism of renal origin: Secondary | ICD-10-CM | POA: Diagnosis not present

## 2023-05-15 DIAGNOSIS — N186 End stage renal disease: Secondary | ICD-10-CM | POA: Diagnosis not present

## 2023-05-16 DIAGNOSIS — I129 Hypertensive chronic kidney disease with stage 1 through stage 4 chronic kidney disease, or unspecified chronic kidney disease: Secondary | ICD-10-CM | POA: Diagnosis not present

## 2023-05-16 DIAGNOSIS — N186 End stage renal disease: Secondary | ICD-10-CM | POA: Diagnosis not present

## 2023-05-16 DIAGNOSIS — Z992 Dependence on renal dialysis: Secondary | ICD-10-CM | POA: Diagnosis not present

## 2023-05-17 DIAGNOSIS — N186 End stage renal disease: Secondary | ICD-10-CM | POA: Diagnosis not present

## 2023-05-17 DIAGNOSIS — Z992 Dependence on renal dialysis: Secondary | ICD-10-CM | POA: Diagnosis not present

## 2023-05-17 DIAGNOSIS — N2581 Secondary hyperparathyroidism of renal origin: Secondary | ICD-10-CM | POA: Diagnosis not present

## 2023-05-17 DIAGNOSIS — M84452D Pathological fracture, left femur, subsequent encounter for fracture with routine healing: Secondary | ICD-10-CM | POA: Diagnosis not present

## 2023-05-20 DIAGNOSIS — N186 End stage renal disease: Secondary | ICD-10-CM | POA: Diagnosis not present

## 2023-05-20 DIAGNOSIS — Z992 Dependence on renal dialysis: Secondary | ICD-10-CM | POA: Diagnosis not present

## 2023-05-20 DIAGNOSIS — N2581 Secondary hyperparathyroidism of renal origin: Secondary | ICD-10-CM | POA: Diagnosis not present

## 2023-05-22 DIAGNOSIS — N186 End stage renal disease: Secondary | ICD-10-CM | POA: Diagnosis not present

## 2023-05-22 DIAGNOSIS — N2581 Secondary hyperparathyroidism of renal origin: Secondary | ICD-10-CM | POA: Diagnosis not present

## 2023-05-22 DIAGNOSIS — Z992 Dependence on renal dialysis: Secondary | ICD-10-CM | POA: Diagnosis not present

## 2023-05-24 DIAGNOSIS — N186 End stage renal disease: Secondary | ICD-10-CM | POA: Diagnosis not present

## 2023-05-24 DIAGNOSIS — N2581 Secondary hyperparathyroidism of renal origin: Secondary | ICD-10-CM | POA: Diagnosis not present

## 2023-05-24 DIAGNOSIS — Z992 Dependence on renal dialysis: Secondary | ICD-10-CM | POA: Diagnosis not present

## 2023-05-26 ENCOUNTER — Ambulatory Visit (INDEPENDENT_AMBULATORY_CARE_PROVIDER_SITE_OTHER): Payer: Medicare PPO | Admitting: Podiatry

## 2023-05-26 ENCOUNTER — Encounter: Payer: Self-pay | Admitting: Podiatry

## 2023-05-26 DIAGNOSIS — Z7409 Other reduced mobility: Secondary | ICD-10-CM | POA: Insufficient documentation

## 2023-05-26 DIAGNOSIS — M79674 Pain in right toe(s): Secondary | ICD-10-CM | POA: Diagnosis not present

## 2023-05-26 DIAGNOSIS — M79675 Pain in left toe(s): Secondary | ICD-10-CM | POA: Diagnosis not present

## 2023-05-26 DIAGNOSIS — B351 Tinea unguium: Secondary | ICD-10-CM

## 2023-05-26 NOTE — Progress Notes (Signed)
   Chief Complaint  Patient presents with   Toe Pain    Hallux left - sensitivity with touch or pressure of tip of toe, thick, discolored nail, also concerned for fungus in the nails    SUBJECTIVE Patient ESRD on dialysis presents to office today complaining of elongated, thickened nails that cause pain while ambulating in shoes.  Patient is unable to trim their own nails.  Patient has pain and sensitivity associated to the left great toe joint especially.  Concern for possible ingrown patient is here for further evaluation and treatment.  Past Medical History:  Diagnosis Date   Cataract    Complication of anesthesia    "Awakens during surgery"   Dyspnea    ESRD (end stage renal disease) (HCC)    [facility], [address]    Glaucoma    Neuropathy    hands - per patient    Allergies  Allergen Reactions   Iron Dextran Shortness Of Breath    Infed Iron Infusion    Darvon [Propoxyphene] Other (See Comments)    Lungs filling up with fluid (reaction to Darvon/Darvocet)     Lactose Other (See Comments)    Gi upset/ sugar in milk   Milk (Cow) Other (See Comments)    Gi upset     OBJECTIVE General Patient is awake, alert, and oriented x 3 and in no acute distress. Derm Skin is dry and supple bilateral. Negative open lesions or macerations. Remaining integument unremarkable. Nails are tender, long, thickened and dystrophic with subungual debris, consistent with onychomycosis, 1-5 bilateral. No signs of infection noted. Vasc  DP and PT pedal pulses palpable bilaterally. Temperature gradient within normal limits.  Neuro grossly intact via light touch  Musculoskeletal Exam prior amputation fifth toe right foot  ASSESSMENT 1.  Pain due to onychomycosis of toenails both  PLAN OF CARE 1. Patient evaluated today.  2. Instructed to maintain good pedal hygiene and foot care.  3. Mechanical debridement of nails 1-5 bilaterally performed using a nail nipper. Filed with dremel without  incident.  Prior to mechanical debridement of the left great toenail plate, injection of 3 mL of 2% lidocaine plain was infiltrated in the left great toe in a digital block fashion to the patient's concern for pain and sensitivity. 4. Return to clinic in 3 mos. with Dr. Stacie Acres for routine footcare   Felecia Shelling, DPM [facility]  Dr. Felecia Shelling, DPM    [address] [place], [place] [address]                Office [phone]  Fax [phone]

## 2023-05-27 ENCOUNTER — Telehealth: Payer: Self-pay | Admitting: Podiatry

## 2023-05-27 NOTE — Telephone Encounter (Signed)
Pts mom called 2 times stating pt is in a lot of pain and she is not sure what needs to be done. She needs to know what the after care needs to be. Please advise!

## 2023-05-29 DIAGNOSIS — Z992 Dependence on renal dialysis: Secondary | ICD-10-CM | POA: Diagnosis not present

## 2023-05-29 DIAGNOSIS — N2581 Secondary hyperparathyroidism of renal origin: Secondary | ICD-10-CM | POA: Diagnosis not present

## 2023-05-29 DIAGNOSIS — N186 End stage renal disease: Secondary | ICD-10-CM | POA: Diagnosis not present

## 2023-05-30 ENCOUNTER — Ambulatory Visit
Admission: RE | Admit: 2023-05-30 | Discharge: 2023-05-30 | Disposition: A | Discharge status: Home / Self Care | Payer: Medicare PPO | Source: Ambulatory Visit | Attending: Neurology | Admitting: Neurology

## 2023-05-30 DIAGNOSIS — Z96642 Presence of left artificial hip joint: Secondary | ICD-10-CM | POA: Diagnosis not present

## 2023-05-30 DIAGNOSIS — R29898 Other symptoms and signs involving the musculoskeletal system: Secondary | ICD-10-CM

## 2023-05-30 DIAGNOSIS — M25452 Effusion, left hip: Secondary | ICD-10-CM | POA: Diagnosis not present

## 2023-05-30 DIAGNOSIS — M25552 Pain in left hip: Secondary | ICD-10-CM | POA: Diagnosis not present

## 2023-05-31 DIAGNOSIS — N2581 Secondary hyperparathyroidism of renal origin: Secondary | ICD-10-CM | POA: Diagnosis not present

## 2023-05-31 DIAGNOSIS — Z992 Dependence on renal dialysis: Secondary | ICD-10-CM | POA: Diagnosis not present

## 2023-05-31 DIAGNOSIS — N186 End stage renal disease: Secondary | ICD-10-CM | POA: Diagnosis not present

## 2023-06-03 DIAGNOSIS — N2581 Secondary hyperparathyroidism of renal origin: Secondary | ICD-10-CM | POA: Diagnosis not present

## 2023-06-03 DIAGNOSIS — Z992 Dependence on renal dialysis: Secondary | ICD-10-CM | POA: Diagnosis not present

## 2023-06-03 DIAGNOSIS — N186 End stage renal disease: Secondary | ICD-10-CM | POA: Diagnosis not present

## 2023-06-05 DIAGNOSIS — Z992 Dependence on renal dialysis: Secondary | ICD-10-CM | POA: Diagnosis not present

## 2023-06-05 DIAGNOSIS — N186 End stage renal disease: Secondary | ICD-10-CM | POA: Diagnosis not present

## 2023-06-05 DIAGNOSIS — N2581 Secondary hyperparathyroidism of renal origin: Secondary | ICD-10-CM | POA: Diagnosis not present

## 2023-06-07 DIAGNOSIS — N2581 Secondary hyperparathyroidism of renal origin: Secondary | ICD-10-CM | POA: Diagnosis not present

## 2023-06-07 DIAGNOSIS — N186 End stage renal disease: Secondary | ICD-10-CM | POA: Diagnosis not present

## 2023-06-07 DIAGNOSIS — Z992 Dependence on renal dialysis: Secondary | ICD-10-CM | POA: Diagnosis not present

## 2023-06-09 ENCOUNTER — Ambulatory Visit: Payer: Medicare PPO | Admitting: Podiatry

## 2023-06-10 DIAGNOSIS — N186 End stage renal disease: Secondary | ICD-10-CM | POA: Diagnosis not present

## 2023-06-10 DIAGNOSIS — N2581 Secondary hyperparathyroidism of renal origin: Secondary | ICD-10-CM | POA: Diagnosis not present

## 2023-06-10 DIAGNOSIS — Z992 Dependence on renal dialysis: Secondary | ICD-10-CM | POA: Diagnosis not present

## 2023-06-12 DIAGNOSIS — Z992 Dependence on renal dialysis: Secondary | ICD-10-CM | POA: Diagnosis not present

## 2023-06-12 DIAGNOSIS — N2581 Secondary hyperparathyroidism of renal origin: Secondary | ICD-10-CM | POA: Diagnosis not present

## 2023-06-12 DIAGNOSIS — N186 End stage renal disease: Secondary | ICD-10-CM | POA: Diagnosis not present

## 2023-06-14 ENCOUNTER — Emergency Department (HOSPITAL_COMMUNITY)
Admission: EM | Admit: 2023-06-14 | Discharge: 2023-06-14 | Disposition: A | Discharge status: Home / Self Care | Payer: Medicare PPO | Attending: Emergency Medicine | Admitting: Emergency Medicine

## 2023-06-14 ENCOUNTER — Other Ambulatory Visit: Payer: Self-pay

## 2023-06-14 ENCOUNTER — Encounter (HOSPITAL_COMMUNITY): Payer: Self-pay

## 2023-06-14 ENCOUNTER — Emergency Department (HOSPITAL_COMMUNITY): Payer: Medicare PPO

## 2023-06-14 DIAGNOSIS — N2581 Secondary hyperparathyroidism of renal origin: Secondary | ICD-10-CM | POA: Diagnosis not present

## 2023-06-14 DIAGNOSIS — R0789 Other chest pain: Secondary | ICD-10-CM | POA: Insufficient documentation

## 2023-06-14 DIAGNOSIS — Z992 Dependence on renal dialysis: Secondary | ICD-10-CM | POA: Diagnosis not present

## 2023-06-14 DIAGNOSIS — Z7401 Bed confinement status: Secondary | ICD-10-CM | POA: Diagnosis not present

## 2023-06-14 DIAGNOSIS — N186 End stage renal disease: Secondary | ICD-10-CM | POA: Diagnosis not present

## 2023-06-14 DIAGNOSIS — Z96641 Presence of right artificial hip joint: Secondary | ICD-10-CM | POA: Insufficient documentation

## 2023-06-14 DIAGNOSIS — I700 Atherosclerosis of aorta: Secondary | ICD-10-CM | POA: Diagnosis not present

## 2023-06-14 DIAGNOSIS — R531 Weakness: Secondary | ICD-10-CM | POA: Diagnosis not present

## 2023-06-14 LAB — CBC
HCT: 40.3 % (ref 39.0–52.0)
Hemoglobin: 12.6 g/dL — ABNORMAL LOW (ref 13.0–17.0)
MCH: 31.3 pg (ref 26.0–34.0)
MCHC: 31.3 g/dL (ref 30.0–36.0)
MCV: 100.2 fL — ABNORMAL HIGH (ref 80.0–100.0)
Platelets: 136 10*3/uL — ABNORMAL LOW (ref 150–400)
RBC: 4.02 MIL/uL — ABNORMAL LOW (ref 4.22–5.81)
RDW: 18.8 % — ABNORMAL HIGH (ref 11.5–15.5)
WBC: 3.4 10*3/uL — ABNORMAL LOW (ref 4.0–10.5)
nRBC: 0 % (ref 0.0–0.2)

## 2023-06-14 LAB — BASIC METABOLIC PANEL
Anion gap: 12 (ref 5–15)
BUN: 34 mg/dL — ABNORMAL HIGH (ref 8–23)
CO2: 30 mmol/L (ref 22–32)
Calcium: 9.8 mg/dL (ref 8.9–10.3)
Chloride: 94 mmol/L — ABNORMAL LOW (ref 98–111)
Creatinine, Ser: 8.33 mg/dL — ABNORMAL HIGH (ref 0.61–1.24)
GFR, Estimated: 6 mL/min — ABNORMAL LOW (ref >60)
Glucose, Bld: 110 mg/dL — ABNORMAL HIGH (ref 70–99)
Potassium: 4.4 mmol/L (ref 3.5–5.1)
Sodium: 136 mmol/L (ref 135–145)

## 2023-06-14 LAB — TROPONIN I (HIGH SENSITIVITY)
Troponin I (High Sensitivity): 13 ng/L (ref <18)
Troponin I (High Sensitivity): 13 ng/L (ref <18)

## 2023-06-14 NOTE — ED Notes (Signed)
Mother Vinod Valles [phone] would like an update asap

## 2023-06-14 NOTE — ED Provider Notes (Signed)
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  He reports he was in dialysis today when he felt a strange sensation in his chest.  He denies pain or palpitations but felt as though cold air was in his lungs.  He denies any pleuritic pain.  He reports the symptoms are short-lived and has since resolved.  He denies any shortness of breath with the symptoms.  He did not have nausea, vomiting, diaphoresis.  He has not had fever/chills/cough.  He reports he completed approximately 30 minutes of his dialysis session and was transported here for evaluation of his chest pain.  He is neck scheduled for dialysis on Tuesday.  He reports he feels as though he can go to Tuesday without additional dialysis.  He reports the dialysis clamp was left on his he was emergently taken to the emergency department.  He denies recent surgery, immobilization, hemoptysis, pleuritic pain, history of PE/DVT, hormonal medication use, unilateral extremity swelling.  Past Medical History:  Diagnosis Date   Cataract    Complication of anesthesia    "Awakens during surgery"   Dyspnea    ESRD (end stage renal disease) (HCC)    [facility], [address]    Glaucoma    Neuropathy    hands - per patient    Past Surgical History:  Procedure Laterality Date   AMPUTATION Right 06/02/2020   Procedure: RIGHT FOOT 5TH RAY AMPUTATION;  Surgeon: Nadara Mustard, MD;  Location: [facility] OR;  Service: Orthopedics;  Laterality: Right;   AV FISTULA PLACEMENT     EYE SURGERY     cataract bilateral   EYE SURGERY Right    FISTULA SUPERFICIALIZATION Right 10/28/2022   Procedure: RIGHT ARM ARTERIOVENOUS FISTULA REVISION WITH COMPLEX SKIN AND SOFT TISSUE CLOSURE;  Surgeon: Victorino Sparrow, MD;  Location: [facility] OR;  Service: Vascular;   Laterality: Right;   INSERTION OF DIALYSIS CATHETER Right 12/08/2020   Procedure: INSERTION OF DIALYSIS CATHETER USING 19cm PALINDROME CHRONIC CATHETER;  Surgeon: Maeola Harman, MD;  Location: [facility] OR;  Service: Vascular;  Laterality: Right;   INSERTION OF DIALYSIS CATHETER Right 10/28/2022   Procedure: INSERTION OF RIGHT INTERNAL JUGULAR TUNNELED DIALYSIS CATHETER;  Surgeon: Victorino Sparrow, MD;  Location: [facility] OR;  Service: Vascular;  Laterality: Right;   REVISON OF ARTERIOVENOUS FISTULA Right 12/06/2020   Procedure: REVISON OF RIGHT ARM FISTULA;  Surgeon: Larina Earthly, MD;  Location: [facility] OR;  Service: Vascular;  Laterality: Right;   TOTAL HIP ARTHROPLASTY Right      Social History   Tobacco Use   Smoking status: Never    Passive exposure: Never   Smokeless tobacco: Never  Vaping Use   Vaping Use: Never used  Substance Use Topics   Alcohol use: Never   Drug use: Never      Review of Systems  A complete ROS was performed with pertinent positives/negatives noted in the HPI.   Vitals:   06/14/23 1635 06/14/23 1645  BP:  137/72  Pulse:    Resp:  14  Temp: 98.5 F (36.9 C)   SpO2:      Physical Exam Vitals and nursing note reviewed.  Constitutional:      General: He is not in acute distress.    Appearance: He is well-developed.  Cardiovascular:     Rate  and Rhythm: Normal rate and regular rhythm.     Pulses: Normal pulses.     Heart sounds: Normal heart sounds. No murmur heard.    No friction rub. No gallop.  Pulmonary:     Effort: Pulmonary effort is normal. No respiratory distress.     Breath sounds: Normal breath sounds. No stridor. No wheezing, rhonchi or rales.  Abdominal:     General: Abdomen is flat. There is no distension.  Skin:    General: Skin is warm and dry.     Capillary Refill: Capillary refill takes less than 2 seconds.  Neurological:     Mental Status: He is alert.     Procedures  MDM:  Imaging/radiology results:  DG Chest 2  View  Result Date: 06/14/2023 CLINICAL DATA:  Chest discomfort EXAM: CHEST - 2 VIEW COMPARISON:  Chest x-ray December 08, 2022 FINDINGS: Right approach central venous catheter in place, terminating in the low SVC. The cardiomediastinal silhouette is unchanged in contour. Unchanged calcified atherosclerosis of the aortic arch. No focal pulmonary opacity. No pleural effusion or pneumothorax. The visualized upper abdomen is unremarkable. No acute osseous abnormality. IMPRESSION: No acute cardiopulmonary abnormality. Electronically Signed   By: Jacob Moores M.D.   On: 06/14/2023 15:40    EKG results: ECG on my interpretation is normal sinus rhythm and rate, without anatomical ischemia representing STEMI, New-onset Arrhythmia, or ischemic equivalent.   Lab results:  Results for orders placed or performed during the hospital encounter of 06/14/23 (from the past 24 hour(s))  Basic metabolic panel     Status: Abnormal   Collection Time: 06/14/23  1:40 PM  Result Value Ref Range   Sodium 136 135 - 145 mmol/L   Potassium 4.4 3.5 - 5.1 mmol/L   Chloride 94 (L) 98 - 111 mmol/L   CO2 30 22 - 32 mmol/L   Glucose, Bld 110 (H) 70 - 99 mg/dL   BUN 34 (H) 8 - 23 mg/dL   Creatinine, Ser 1.61 (H) 0.61 - 1.24 mg/dL   Calcium 9.8 8.9 - 09.6 mg/dL   GFR, Estimated 6 (L) >60 mL/min   Anion gap 12 5 - 15  CBC     Status: Abnormal   Collection Time: 06/14/23  1:40 PM  Result Value Ref Range   WBC 3.4 (L) 4.0 - 10.5 K/uL   RBC 4.02 (L) 4.22 - 5.81 MIL/uL   Hemoglobin 12.6 (L) 13.0 - 17.0 g/dL   HCT 04.5 40.9 - 81.1 %   MCV 100.2 (H) 80.0 - 100.0 fL   MCH 31.3 26.0 - 34.0 pg   MCHC 31.3 30.0 - 36.0 g/dL   RDW 91.4 (H) 78.2 - 95.6 %   Platelets 136 (L) 150 - 400 K/uL   nRBC 0.0 0.0 - 0.2 %  Troponin I (High Sensitivity)     Status: None   Collection Time: 06/14/23  1:40 PM  Result Value Ref Range   Troponin I (High Sensitivity) 13 <18 ng/L  Troponin I (High Sensitivity)     Status: None   Collection  Time: 06/14/23  3:38 PM  Result Value Ref Range   Troponin I (High Sensitivity) 13 <18 ng/L    Key medications administered in the ER:  Medications - No data to display  Medical decision making: -Vital signs stable. Patient afebrile, hemodynamically stable, and non-toxic appearing. -Patient's presentation is most consistent with acute complicated illness / injury requiring diagnostic workup.Marland Kitchen ISAIA CHADBOURNE is a 67 y.o. male presenting to the  emergency department with chest pain.  -Additional history obtained from EMS report he was hemodynamically stable in transit.  He had approximately 300 cc of fluid removed reportedly. -Patient also reports his dialysis clamp has been on since dialysis and he usually has this on for 20 to 30 minutes postdialysis.  This has been removed and a bandage has been applied. -EKG I obtained reveals no anatomical ischemia representing STEMI, New-onset Arrhythmia, or ischemic equivalent. Initial troponin 13. Second troponin 13.  Delta troponin 0.  Therefore do not suspect ACS at this time. No concerns for Pericardial Tamponade on EKG and in light of patients hemodynamic stability doubt this pathology. No pain related to supine or prone positions and given EKG doubt Pericarditis. CXR unremarkable for focal airspace disease, patient is afebrile, no cough, and WBC shows no leukocytosis, do not suspect Pneumonia. CXR without evidence of Pneumothorax. CXR without concern for Esophageal Tear and there is no recent intractable emesis or esophageal instrumentation. No peritonitis or free air on CXR worrisome for Perforated Abdominal Viscous. Unlikely myocarditis, does not fit clinical picture, chest pain not exertional, no EKG findings to support. Unlikely Pulmonary Embolism as well's score, low probability. Therefore will not obtain CTA Chest or D-dimer. Pain is not described as tearing and does not radiate to back, no pulse deficit, no neurologic complaints. CXR does not show  widened mediastinum. Doubt Aortic Dissection.  Given transient episode with reassuring workup and exam with stable vitals I believe the patient is stable for outpatient management.  I discussed strict return precautions for ED return.  Discussed follow-up PCP.  Patient discharged.    Medical Decision Making Amount and/or Complexity of Data Reviewed Labs: ordered. Decision-making details documented in ED Course. Radiology: ordered and independent interpretation performed. Decision-making details documented in ED Course. ECG/medicine tests: ordered and independent interpretation performed. Decision-making details documented in ED Course.     The plan for this patient was discussed with Dr. Rush Landmark, who voiced agreement and who oversaw evaluation and treatment of this patient.  Marta Lamas, MD Emergency Medicine, PGY-3  Note: Dragon medical dictation software was used in the creation of this note.   Clinical Impression:  1. Atypical chest pain          Chase Caller, MD 06/14/23 1730    Tegeler, Canary Brim, MD 06/15/23 [phone]

## 2023-06-14 NOTE — ED Notes (Signed)
Ptar called 

## 2023-06-14 NOTE — ED Notes (Signed)
Help get patient on the bed on the monitor patient is resting with call bell in reach

## 2023-06-14 NOTE — ED Triage Notes (Addendum)
Per EMS, Pt, from dialysis, c/o chest discomfort.  Vitals WDL.  Pt had 260cc fluid removed.  Pt reports "cold sensation like when he used to run in the cold."  Pt denies associated symptoms.  Pt sts "I'm not feeling the same discomfort, but I don't feel fully recovered either."     Dialysis fistula is still clamped off.   Pt is legally blind.

## 2023-06-14 NOTE — ED Notes (Signed)
Patient transported to X-ray 

## 2023-06-14 NOTE — Discharge Instructions (Signed)
Timothy Ponce:  Thank you for allowing [place] to take care of you today.  We hope you begin feeling better soon.  To-Do: Please follow-up with your primary doctor. Please return to the Emergency Department or call 911 if you experience chest pain, shortness of breath, severe pain, severe fever, altered mental status, or have any reason to think that you need emergency medical care.  Thank you again.  Hope you feel better soon.  Department of Emergency Medicine [facility]

## 2023-06-15 DIAGNOSIS — Z992 Dependence on renal dialysis: Secondary | ICD-10-CM | POA: Diagnosis not present

## 2023-06-15 DIAGNOSIS — I129 Hypertensive chronic kidney disease with stage 1 through stage 4 chronic kidney disease, or unspecified chronic kidney disease: Secondary | ICD-10-CM | POA: Diagnosis not present

## 2023-06-15 DIAGNOSIS — N186 End stage renal disease: Secondary | ICD-10-CM | POA: Diagnosis not present

## 2023-06-16 DIAGNOSIS — M84452D Pathological fracture, left femur, subsequent encounter for fracture with routine healing: Secondary | ICD-10-CM | POA: Diagnosis not present

## 2023-06-17 DIAGNOSIS — N186 End stage renal disease: Secondary | ICD-10-CM | POA: Diagnosis not present

## 2023-06-17 DIAGNOSIS — Z992 Dependence on renal dialysis: Secondary | ICD-10-CM | POA: Diagnosis not present

## 2023-06-17 DIAGNOSIS — N2581 Secondary hyperparathyroidism of renal origin: Secondary | ICD-10-CM | POA: Diagnosis not present

## 2023-06-19 DIAGNOSIS — N2581 Secondary hyperparathyroidism of renal origin: Secondary | ICD-10-CM | POA: Diagnosis not present

## 2023-06-19 DIAGNOSIS — Z992 Dependence on renal dialysis: Secondary | ICD-10-CM | POA: Diagnosis not present

## 2023-06-19 DIAGNOSIS — N186 End stage renal disease: Secondary | ICD-10-CM | POA: Diagnosis not present

## 2023-06-21 DIAGNOSIS — Z992 Dependence on renal dialysis: Secondary | ICD-10-CM | POA: Diagnosis not present

## 2023-06-21 DIAGNOSIS — N2581 Secondary hyperparathyroidism of renal origin: Secondary | ICD-10-CM | POA: Diagnosis not present

## 2023-06-21 DIAGNOSIS — N186 End stage renal disease: Secondary | ICD-10-CM | POA: Diagnosis not present

## 2023-06-24 DIAGNOSIS — N2581 Secondary hyperparathyroidism of renal origin: Secondary | ICD-10-CM | POA: Diagnosis not present

## 2023-06-24 DIAGNOSIS — Z992 Dependence on renal dialysis: Secondary | ICD-10-CM | POA: Diagnosis not present

## 2023-06-24 DIAGNOSIS — N186 End stage renal disease: Secondary | ICD-10-CM | POA: Diagnosis not present

## 2023-06-26 DIAGNOSIS — Z992 Dependence on renal dialysis: Secondary | ICD-10-CM | POA: Diagnosis not present

## 2023-06-26 DIAGNOSIS — N2581 Secondary hyperparathyroidism of renal origin: Secondary | ICD-10-CM | POA: Diagnosis not present

## 2023-06-26 DIAGNOSIS — N186 End stage renal disease: Secondary | ICD-10-CM | POA: Diagnosis not present

## 2023-06-28 DIAGNOSIS — Z992 Dependence on renal dialysis: Secondary | ICD-10-CM | POA: Diagnosis not present

## 2023-06-28 DIAGNOSIS — N2581 Secondary hyperparathyroidism of renal origin: Secondary | ICD-10-CM | POA: Diagnosis not present

## 2023-06-28 DIAGNOSIS — N186 End stage renal disease: Secondary | ICD-10-CM | POA: Diagnosis not present

## 2023-07-01 DIAGNOSIS — N2581 Secondary hyperparathyroidism of renal origin: Secondary | ICD-10-CM | POA: Diagnosis not present

## 2023-07-01 DIAGNOSIS — Z992 Dependence on renal dialysis: Secondary | ICD-10-CM | POA: Diagnosis not present

## 2023-07-01 DIAGNOSIS — N186 End stage renal disease: Secondary | ICD-10-CM | POA: Diagnosis not present

## 2023-07-03 DIAGNOSIS — N186 End stage renal disease: Secondary | ICD-10-CM | POA: Diagnosis not present

## 2023-07-03 DIAGNOSIS — N2581 Secondary hyperparathyroidism of renal origin: Secondary | ICD-10-CM | POA: Diagnosis not present

## 2023-07-03 DIAGNOSIS — Z992 Dependence on renal dialysis: Secondary | ICD-10-CM | POA: Diagnosis not present

## 2023-07-05 DIAGNOSIS — N186 End stage renal disease: Secondary | ICD-10-CM | POA: Diagnosis not present

## 2023-07-05 DIAGNOSIS — N2581 Secondary hyperparathyroidism of renal origin: Secondary | ICD-10-CM | POA: Diagnosis not present

## 2023-07-05 DIAGNOSIS — Z992 Dependence on renal dialysis: Secondary | ICD-10-CM | POA: Diagnosis not present

## 2023-07-08 DIAGNOSIS — N2581 Secondary hyperparathyroidism of renal origin: Secondary | ICD-10-CM | POA: Diagnosis not present

## 2023-07-08 DIAGNOSIS — N186 End stage renal disease: Secondary | ICD-10-CM | POA: Diagnosis not present

## 2023-07-08 DIAGNOSIS — Z992 Dependence on renal dialysis: Secondary | ICD-10-CM | POA: Diagnosis not present

## 2023-07-10 DIAGNOSIS — N186 End stage renal disease: Secondary | ICD-10-CM | POA: Diagnosis not present

## 2023-07-10 DIAGNOSIS — Z992 Dependence on renal dialysis: Secondary | ICD-10-CM | POA: Diagnosis not present

## 2023-07-10 DIAGNOSIS — N2581 Secondary hyperparathyroidism of renal origin: Secondary | ICD-10-CM | POA: Diagnosis not present

## 2023-07-12 DIAGNOSIS — N2581 Secondary hyperparathyroidism of renal origin: Secondary | ICD-10-CM | POA: Diagnosis not present

## 2023-07-12 DIAGNOSIS — N186 End stage renal disease: Secondary | ICD-10-CM | POA: Diagnosis not present

## 2023-07-12 DIAGNOSIS — Z992 Dependence on renal dialysis: Secondary | ICD-10-CM | POA: Diagnosis not present

## 2023-07-15 DIAGNOSIS — N186 End stage renal disease: Secondary | ICD-10-CM | POA: Diagnosis not present

## 2023-07-15 DIAGNOSIS — N2581 Secondary hyperparathyroidism of renal origin: Secondary | ICD-10-CM | POA: Diagnosis not present

## 2023-07-15 DIAGNOSIS — Z992 Dependence on renal dialysis: Secondary | ICD-10-CM | POA: Diagnosis not present

## 2023-07-16 DIAGNOSIS — Z992 Dependence on renal dialysis: Secondary | ICD-10-CM | POA: Diagnosis not present

## 2023-07-16 DIAGNOSIS — N186 End stage renal disease: Secondary | ICD-10-CM | POA: Diagnosis not present

## 2023-07-16 DIAGNOSIS — I129 Hypertensive chronic kidney disease with stage 1 through stage 4 chronic kidney disease, or unspecified chronic kidney disease: Secondary | ICD-10-CM | POA: Diagnosis not present

## 2023-07-17 DIAGNOSIS — M84452D Pathological fracture, left femur, subsequent encounter for fracture with routine healing: Secondary | ICD-10-CM | POA: Diagnosis not present

## 2023-07-17 DIAGNOSIS — N2581 Secondary hyperparathyroidism of renal origin: Secondary | ICD-10-CM | POA: Diagnosis not present

## 2023-07-17 DIAGNOSIS — N186 End stage renal disease: Secondary | ICD-10-CM | POA: Diagnosis not present

## 2023-07-17 DIAGNOSIS — Z992 Dependence on renal dialysis: Secondary | ICD-10-CM | POA: Diagnosis not present

## 2023-07-19 DIAGNOSIS — N186 End stage renal disease: Secondary | ICD-10-CM | POA: Diagnosis not present

## 2023-07-19 DIAGNOSIS — N2581 Secondary hyperparathyroidism of renal origin: Secondary | ICD-10-CM | POA: Diagnosis not present

## 2023-07-19 DIAGNOSIS — Z992 Dependence on renal dialysis: Secondary | ICD-10-CM | POA: Diagnosis not present

## 2023-07-22 DIAGNOSIS — N186 End stage renal disease: Secondary | ICD-10-CM | POA: Diagnosis not present

## 2023-07-22 DIAGNOSIS — Z992 Dependence on renal dialysis: Secondary | ICD-10-CM | POA: Diagnosis not present

## 2023-07-22 DIAGNOSIS — N2581 Secondary hyperparathyroidism of renal origin: Secondary | ICD-10-CM | POA: Diagnosis not present

## 2023-07-26 DIAGNOSIS — Z992 Dependence on renal dialysis: Secondary | ICD-10-CM | POA: Diagnosis not present

## 2023-07-26 DIAGNOSIS — N186 End stage renal disease: Secondary | ICD-10-CM | POA: Diagnosis not present

## 2023-07-26 DIAGNOSIS — N2581 Secondary hyperparathyroidism of renal origin: Secondary | ICD-10-CM | POA: Diagnosis not present

## 2023-07-29 DIAGNOSIS — Z992 Dependence on renal dialysis: Secondary | ICD-10-CM | POA: Diagnosis not present

## 2023-07-29 DIAGNOSIS — N2581 Secondary hyperparathyroidism of renal origin: Secondary | ICD-10-CM | POA: Diagnosis not present

## 2023-07-29 DIAGNOSIS — N186 End stage renal disease: Secondary | ICD-10-CM | POA: Diagnosis not present

## 2023-07-30 DIAGNOSIS — H401133 Primary open-angle glaucoma, bilateral, severe stage: Secondary | ICD-10-CM | POA: Diagnosis not present

## 2023-07-30 DIAGNOSIS — Z961 Presence of intraocular lens: Secondary | ICD-10-CM | POA: Diagnosis not present

## 2023-07-31 DIAGNOSIS — Z992 Dependence on renal dialysis: Secondary | ICD-10-CM | POA: Diagnosis not present

## 2023-07-31 DIAGNOSIS — N186 End stage renal disease: Secondary | ICD-10-CM | POA: Diagnosis not present

## 2023-07-31 DIAGNOSIS — N2581 Secondary hyperparathyroidism of renal origin: Secondary | ICD-10-CM | POA: Diagnosis not present

## 2023-08-02 DIAGNOSIS — N186 End stage renal disease: Secondary | ICD-10-CM | POA: Diagnosis not present

## 2023-08-02 DIAGNOSIS — Z992 Dependence on renal dialysis: Secondary | ICD-10-CM | POA: Diagnosis not present

## 2023-08-02 DIAGNOSIS — N2581 Secondary hyperparathyroidism of renal origin: Secondary | ICD-10-CM | POA: Diagnosis not present

## 2023-08-05 DIAGNOSIS — N2581 Secondary hyperparathyroidism of renal origin: Secondary | ICD-10-CM | POA: Diagnosis not present

## 2023-08-05 DIAGNOSIS — N186 End stage renal disease: Secondary | ICD-10-CM | POA: Diagnosis not present

## 2023-08-05 DIAGNOSIS — Z992 Dependence on renal dialysis: Secondary | ICD-10-CM | POA: Diagnosis not present

## 2023-08-07 DIAGNOSIS — N186 End stage renal disease: Secondary | ICD-10-CM | POA: Diagnosis not present

## 2023-08-07 DIAGNOSIS — Z992 Dependence on renal dialysis: Secondary | ICD-10-CM | POA: Diagnosis not present

## 2023-08-07 DIAGNOSIS — N2581 Secondary hyperparathyroidism of renal origin: Secondary | ICD-10-CM | POA: Diagnosis not present

## 2023-08-09 DIAGNOSIS — N186 End stage renal disease: Secondary | ICD-10-CM | POA: Diagnosis not present

## 2023-08-09 DIAGNOSIS — N2581 Secondary hyperparathyroidism of renal origin: Secondary | ICD-10-CM | POA: Diagnosis not present

## 2023-08-09 DIAGNOSIS — Z992 Dependence on renal dialysis: Secondary | ICD-10-CM | POA: Diagnosis not present

## 2023-08-12 DIAGNOSIS — N2581 Secondary hyperparathyroidism of renal origin: Secondary | ICD-10-CM | POA: Diagnosis not present

## 2023-08-12 DIAGNOSIS — Z992 Dependence on renal dialysis: Secondary | ICD-10-CM | POA: Diagnosis not present

## 2023-08-12 DIAGNOSIS — N186 End stage renal disease: Secondary | ICD-10-CM | POA: Diagnosis not present

## 2023-08-14 DIAGNOSIS — N2581 Secondary hyperparathyroidism of renal origin: Secondary | ICD-10-CM | POA: Diagnosis not present

## 2023-08-14 DIAGNOSIS — Z992 Dependence on renal dialysis: Secondary | ICD-10-CM | POA: Diagnosis not present

## 2023-08-14 DIAGNOSIS — N186 End stage renal disease: Secondary | ICD-10-CM | POA: Diagnosis not present

## 2023-08-15 DIAGNOSIS — Z452 Encounter for adjustment and management of vascular access device: Secondary | ICD-10-CM | POA: Diagnosis not present

## 2023-08-15 DIAGNOSIS — Z992 Dependence on renal dialysis: Secondary | ICD-10-CM | POA: Diagnosis not present

## 2023-08-15 DIAGNOSIS — N186 End stage renal disease: Secondary | ICD-10-CM | POA: Diagnosis not present

## 2023-08-16 DIAGNOSIS — N186 End stage renal disease: Secondary | ICD-10-CM | POA: Diagnosis not present

## 2023-08-16 DIAGNOSIS — I129 Hypertensive chronic kidney disease with stage 1 through stage 4 chronic kidney disease, or unspecified chronic kidney disease: Secondary | ICD-10-CM | POA: Diagnosis not present

## 2023-08-16 DIAGNOSIS — N2581 Secondary hyperparathyroidism of renal origin: Secondary | ICD-10-CM | POA: Diagnosis not present

## 2023-08-16 DIAGNOSIS — Z992 Dependence on renal dialysis: Secondary | ICD-10-CM | POA: Diagnosis not present

## 2023-08-17 DIAGNOSIS — M84452D Pathological fracture, left femur, subsequent encounter for fracture with routine healing: Secondary | ICD-10-CM | POA: Diagnosis not present

## 2023-08-19 DIAGNOSIS — N186 End stage renal disease: Secondary | ICD-10-CM | POA: Diagnosis not present

## 2023-08-19 DIAGNOSIS — N2581 Secondary hyperparathyroidism of renal origin: Secondary | ICD-10-CM | POA: Diagnosis not present

## 2023-08-19 DIAGNOSIS — Z992 Dependence on renal dialysis: Secondary | ICD-10-CM | POA: Diagnosis not present

## 2023-08-20 DIAGNOSIS — N186 End stage renal disease: Secondary | ICD-10-CM | POA: Diagnosis not present

## 2023-08-20 DIAGNOSIS — G63 Polyneuropathy in diseases classified elsewhere: Secondary | ICD-10-CM | POA: Diagnosis not present

## 2023-08-20 DIAGNOSIS — Z992 Dependence on renal dialysis: Secondary | ICD-10-CM | POA: Diagnosis not present

## 2023-08-20 DIAGNOSIS — I9589 Other hypotension: Secondary | ICD-10-CM | POA: Diagnosis not present

## 2023-08-20 DIAGNOSIS — D631 Anemia in chronic kidney disease: Secondary | ICD-10-CM | POA: Diagnosis not present

## 2023-08-20 DIAGNOSIS — Z23 Encounter for immunization: Secondary | ICD-10-CM | POA: Diagnosis not present

## 2023-08-21 DIAGNOSIS — Z992 Dependence on renal dialysis: Secondary | ICD-10-CM | POA: Diagnosis not present

## 2023-08-21 DIAGNOSIS — N186 End stage renal disease: Secondary | ICD-10-CM | POA: Diagnosis not present

## 2023-08-21 DIAGNOSIS — N2581 Secondary hyperparathyroidism of renal origin: Secondary | ICD-10-CM | POA: Diagnosis not present

## 2023-08-23 DIAGNOSIS — N2581 Secondary hyperparathyroidism of renal origin: Secondary | ICD-10-CM | POA: Diagnosis not present

## 2023-08-23 DIAGNOSIS — N186 End stage renal disease: Secondary | ICD-10-CM | POA: Diagnosis not present

## 2023-08-23 DIAGNOSIS — Z992 Dependence on renal dialysis: Secondary | ICD-10-CM | POA: Diagnosis not present

## 2023-08-26 DIAGNOSIS — N186 End stage renal disease: Secondary | ICD-10-CM | POA: Diagnosis not present

## 2023-08-26 DIAGNOSIS — Z992 Dependence on renal dialysis: Secondary | ICD-10-CM | POA: Diagnosis not present

## 2023-08-26 DIAGNOSIS — N2581 Secondary hyperparathyroidism of renal origin: Secondary | ICD-10-CM | POA: Diagnosis not present

## 2023-08-27 ENCOUNTER — Ambulatory Visit (INDEPENDENT_AMBULATORY_CARE_PROVIDER_SITE_OTHER): Payer: Medicare PPO | Admitting: Podiatry

## 2023-08-27 ENCOUNTER — Encounter: Payer: Self-pay | Admitting: Podiatry

## 2023-08-27 DIAGNOSIS — N186 End stage renal disease: Secondary | ICD-10-CM

## 2023-08-27 DIAGNOSIS — M79674 Pain in right toe(s): Secondary | ICD-10-CM

## 2023-08-27 DIAGNOSIS — M79675 Pain in left toe(s): Secondary | ICD-10-CM | POA: Diagnosis not present

## 2023-08-27 DIAGNOSIS — I999 Unspecified disorder of circulatory system: Secondary | ICD-10-CM

## 2023-08-27 DIAGNOSIS — B351 Tinea unguium: Secondary | ICD-10-CM | POA: Diagnosis not present

## 2023-08-27 DIAGNOSIS — D689 Coagulation defect, unspecified: Secondary | ICD-10-CM | POA: Diagnosis not present

## 2023-08-27 DIAGNOSIS — Z992 Dependence on renal dialysis: Secondary | ICD-10-CM

## 2023-08-27 NOTE — Progress Notes (Signed)
This patient returns to my office for at risk foot care.  This patient requires this care by a professional since this patient will be at risk due to having ESRD, PAD,coagulation defect and amputation fifth toe right foot.  This patient is unable to cut nails himself since the patient cannot reach his nails.These nails are painful walking and wearing shoes.  This patient presents for at risk foot care today.  General Appearance  Alert, conversant and in no acute stress.  Vascular  Dorsalis pedis and posterior tibial  pulses are weakly palpable  bilaterally.  Capillary return is within normal limits  bilaterally. Cold feet  bilaterally. Absent digital hair  B/L.  Neurologic  Senn-Weinstein monofilament wire test within normal limits  bilaterally. Muscle power within normal limits bilaterally.  Nails Thick disfigured discolored nails with subungual debris  from hallux to fifth toes left and 1-4 right foot. No evidence of bacterial infection or drainage bilaterally.  Orthopedic  No limitations of motion  feet .  No crepitus or effusions noted.  No bony pathology or digital deformities noted.  Skin  normotropic skin with no porokeratosis noted bilaterally.  No signs of infections or ulcers noted.     Onychomycosis  Pain in right toes  Pain in left toes  Consent was obtained for treatment procedures.   Mechanical debridement of nails 1-5  left foot and 1-4 right foot. performed with a nail nipper.  Filed with dremel without incident. His left hallux nail was debrided after 2% xylocaine plain.    Return office visit   10 weeks                    Told patient to return for periodic foot care and evaluation due to potential at risk complications.   Helane Gunther DPM

## 2023-08-28 DIAGNOSIS — N186 End stage renal disease: Secondary | ICD-10-CM | POA: Diagnosis not present

## 2023-08-28 DIAGNOSIS — Z992 Dependence on renal dialysis: Secondary | ICD-10-CM | POA: Diagnosis not present

## 2023-08-28 DIAGNOSIS — N2581 Secondary hyperparathyroidism of renal origin: Secondary | ICD-10-CM | POA: Diagnosis not present

## 2023-08-30 DIAGNOSIS — N2581 Secondary hyperparathyroidism of renal origin: Secondary | ICD-10-CM | POA: Diagnosis not present

## 2023-08-30 DIAGNOSIS — Z992 Dependence on renal dialysis: Secondary | ICD-10-CM | POA: Diagnosis not present

## 2023-08-30 DIAGNOSIS — N186 End stage renal disease: Secondary | ICD-10-CM | POA: Diagnosis not present

## 2023-09-03 NOTE — Progress Notes (Signed)
I saw Timothy Ponce in neurology clinic on 09/17/23 in follow up for hip pain, arm pain and weakness.  HPI: Timothy Ponce is a 67 y.o. year old male with a history of ESRD on dialysis, legally blind, glaucoma, cataracts, and fractured left hip with lytic lesions who we last saw on 03/07/23.  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 He is having difficulty moving both hands. Patient notes he had no issues prior to fistula in right arm. Both hands have difficulty, but the right is progressing much faster. He has difficulty moving fingers at all. He occasionally has discomfort in his right hand. 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 He had another aneurysm and surgery one year prior.    He mentions that after dialysis his arm is wrapped tightly to stop bleeding. He would notice more symptoms during this time his wrapped. He also mentions a clamp that is put on his artery after dialysis. This is on for 20-25 minutes.   He had therapy a couple of years ago and does home exercises still daily. Despite this, his hands continue to get worse. About 1-2 years ago, patient mentioned symptoms. Had NCV/EMG/US at [facility] on 09/13/21. Don't have the results but per ortho note from 09/24/21, it showed right median mononeuropathy (no sensory or motor response), and ulnar at elbow on right. Per patient, he states his nerves were mainly checked on the left, and only the wrist on the right, so he disputes the interpretation. Enlargement of nerves was also seen on NMUS. He went to therapy per note. 01/23/22 note indicates that patient declined injections for trigger fingers. Patient mentions that he has been told he has cysts in the palm and left wrist.   Patient had a CT left hip on 12/04/22. He had this because he had a lot of pain and not being able to put weight on his left leg. CT showed acute nondisplaced  pathologic fracture of the left femoral neck through underlying lytic lesions, highly concerning for metastatic disease or multiple myeloma. Patient will be getting lab work soon.    He does not report any constitutional symptoms like fever, night sweats, anorexia or unintentional weight loss.   EtOH use: None  Restrictive diet? None  03/07/23: Patient had extreme pain in hip on 12/08/22 and was going to the ED when he noticed no feeling of the left thigh. Patient then had hip replacement on 12/12/22.    He went to rehab in [facility] after his surgery. He was not walking well due to left leg not being able to move. A few weeks after surgery, the movement improved in the leg. It has improved some. He was able to walk with a walker if the leg is straight. If he bends the leg, he cannot hold weight. Patient was trying to work out and get his hip strong about 3 weeks ago. He fell and had a left knee contusion with fluid on the knee. Bending the knee currently causes a lot of pain.   Regarding his arm symptoms that he was previously seen for, the symptoms are about the same. Patient continues to do exercises that were given by therapy. When he has excess fluid, he has excess fluid and more difficulty moving his hands.    His left leg is more concerning to him currently.   He is  getting work up for lytic lesions of the hip at [facility]. 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 They told him that is was not caused by cancer, and likely related to dialysis. They are currently monitoring. He had thyroid biopsy as well, not likely cancer.  Most recent Assessment and Plan (03/07/23): This is Timothy Ponce, a 67 y.o. male with bilateral hand weakness and numbness, now with new onset left thigh numbness and left leg weakness since 11/2022. Numbness began prior to hip replacement surgery and weakness after the surgery. The etiology of the new left leg symptoms are not clear, but may localize to  root, plexus, or individual nerve (femoral?).    Regarding his arms, previous EMG suggested a right median and ulnar neuropathy per ortho notes, but patient is adamant that extensive testing was done on his left side not the right due to inability to safely test the right due to fistula. The etiology of patient's symptoms is unclear, but there does seem to be distal weakness in multiple nerve distributions including the median, ulnar, and radial branches. 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 We previously discussed work up including repeating EMG with the caveat that his extensive surgeries and deformities in the RUE will make testing limited and difficult.    Patient preferred to focus on the left leg for now and defer the planned work up for the arms for now.   Plan: -EMG: LLE -Will explore arms at a later date  Since their last visit: EMG on 04/07/23 showed no significant abnormalities. Patient was unable to activate left quad, but no spontaneous activity was seen. I recommended MRI of left hip. It showed bursitis. I recommended patient discuss with ortho. He finds that his left leg is normal now. I states that it just started working about 1 month ago. He is working the get the strength back in the left leg. He still has difficulty bending the left leg. He had a fall a couple of weeks ago while putting on his shoes.  He feels his arms are about the same as prior. He continues to not being able to bend the fingers. He does some of the prior therapy exercises. He does not have significant pain unless he is trying to use the hands too much. He has a lot of stiffness.   MEDICATIONS:  Outpatient Encounter Medications as of 09/17/2023  Medication Sig   acetaminophen (TYLENOL) 650 MG CR tablet Take 650 mg by mouth every 8 (eight) hours as needed for pain.   brimonidine (ALPHAGAN) 0.2 % ophthalmic solution Place 1 drop into both eyes 3 (three) times daily.   cinacalcet (SENSIPAR)  30 MG tablet Take 30 mg by mouth daily.   Docusate Sodium (DSS) 100 MG CAPS Take 100 mg by mouth as needed (prn).   dorzolamide-timolol (COSOPT) 2-0.5 % ophthalmic solution Place 1 drop into both eyes 2 (two) times daily.   Lactase (LACTOSE INTOLERANCE PO) Take 4 oz by mouth as needed.   latanoprost (XALATAN) 0.005 % ophthalmic solution Place 1 drop into both eyes at bedtime.   losartan (COZAAR) 25 MG tablet Take 25 mg by mouth daily.   RHOPRESSA 0.02 % SOLN Place 1 drop into both eyes daily.   sevelamer (RENAGEL) 800 MG tablet Take 800 mg by mouth 3 (three) times daily with meals.   sevelamer carbonate (RENVELA) 800 MG tablet Take by mouth.   HYDROcodone-acetaminophen (NORCO/VICODIN) 5-325 MG tablet Take 2 tablets by mouth every 6 (  six) hours as needed. (Patient not taking: Reported on 03/07/2023)   No facility-administered encounter medications on file as of 09/17/2023.    PAST MEDICAL HISTORY: Past Medical History:  Diagnosis Date   Cataract    Complication of anesthesia    "Awakens during surgery"   Dyspnea    ESRD (end stage renal disease) (HCC)    TTHSAT, [address]    Glaucoma    Neuropathy    hands - per patient    PAST SURGICAL HISTORY: Past Surgical History:  Procedure Laterality Date   AMPUTATION Right 06/02/2020   Procedure: RIGHT FOOT 5TH RAY AMPUTATION;  Surgeon: Nadara Mustard, MD;  Location: [facility] OR;  Service: Orthopedics;  Laterality: Right;   AV FISTULA PLACEMENT     EYE SURGERY     cataract bilateral   EYE SURGERY Right    FISTULA SUPERFICIALIZATION Right 10/28/2022   Procedure: RIGHT ARM ARTERIOVENOUS FISTULA REVISION WITH COMPLEX SKIN AND SOFT TISSUE CLOSURE;  Surgeon: Victorino Sparrow, MD;  Location: [facility] OR;  Service: Vascular;  Laterality: Right;   INSERTION OF DIALYSIS CATHETER Right 12/08/2020   Procedure: INSERTION OF DIALYSIS CATHETER USING 19cm PALINDROME CHRONIC CATHETER;  Surgeon: Maeola Harman, MD;  Location: [facility] OR;  Service: Vascular;   Laterality: Right;   INSERTION OF DIALYSIS CATHETER Right 10/28/2022   Procedure: INSERTION OF RIGHT INTERNAL JUGULAR TUNNELED DIALYSIS CATHETER;  Surgeon: Victorino Sparrow, MD;  Location: [facility] OR;  Service: Vascular;  Laterality: Right;   REVISON OF ARTERIOVENOUS FISTULA Right 12/06/2020   Procedure: REVISON OF RIGHT ARM FISTULA;  Surgeon: Larina Earthly, MD;  Location: [facility] OR;  Service: Vascular;  Laterality: Right;   TOTAL HIP ARTHROPLASTY Right     ALLERGIES: Allergies  Allergen Reactions   Iron Dextran Shortness Of Breath    Infed Iron Infusion    Darvon [Propoxyphene] Other (See Comments)    Lungs filling up with fluid (reaction to Darvon/Darvocet)     Lactose Other (See Comments)    Gi upset/ sugar in milk   Milk (Cow) Other (See Comments)    Gi upset    FAMILY HISTORY: No family history on file.  SOCIAL HISTORY: Social History   Tobacco Use   Smoking status: Never    Passive exposure: Never   Smokeless tobacco: Never  Vaping Use   Vaping status: Never Used  Substance Use Topics   Alcohol use: Never   Drug use: Never   Social History   Social History Narrative   Are you right handed or left handed? Right handed    Are you currently employed ? no   What is your current occupation?   Do you live at home alone? no   Who lives with you? Lives with mother   What type of home do you live in: 1 story or 2 story?         Objective:  Vital Signs:  BP 98/66   Pulse 77   Ht 5\' 8"  (1.727 m)   Wt 126 lb (57.2 kg)   BMI 19.16 kg/m   General: General appearance: Awake and alert. No distress. Cooperative with exam.  Skin: No obvious rash or jaundice. HEENT: Atraumatic. Anicteric. Lungs: Non-labored breathing on room air  Extremities: No edema. No obvious deformity.   Neurological: Mental Status: Alert. Speech fluent. No pseudobulbar affect Cranial Nerves: CNII: Poor vision. CNIII, IV, VI: Pupils equal, sluggish. CN V: Facial sensation intact bilaterally to  fine touch. CN VII: Facial muscles symmetric  and strong. No ptosis at rest CN VIII: Hears finger rub well bilaterally. CN IX: No hypophonia. CN X: Palate elevates symmetrically. CN XI: Full strength shoulder shrug bilaterally. CN XII: Tongue protrusion full and midline. No atrophy or fasciculations. No significant dysarthria Motor: Tone is normal. Atrophy of bilateral forearms and intrinsic hand muscles  Individual muscle group testing (MRC grade out of 5):  Movement     Neck flexion 5    Neck extension 5     Right Left   Shoulder abduction 5 5   Elbow flexion 5 5   Elbow extension 5 5-   Wrist extension 5 5   Wrist flexion 5 5   Finger abduction - FDI 4+ 4+   Finger abduction - ADM 4 4   Finger extension 4+ 4+   Finger distal flexion - 2/3 4- 4-   Finger distal flexion - 4/5 4- 4-   Thumb flexion - FPL 4 4   Thumb abduction - APB 3 3    Hip flexion 5 4+   Knee extension 5 4+   Knee flexion 5 5-   Dorsiflexion 5 5   Plantarflexion 5 5     Reflexes:  Right Left  Bicep 2+ 2+  Tricep 2+ 2+  BrRad 2+ 2+  Knee 2+ 2+  Ankle 1+ 1+   Sensation: Pinprick: Intact in all extremities Gait: In a wheel chair, unable to ambulate  Lab and Test Review: New results: 06/14/23: BMP significant for glucose 100, BUN 34, Cr 8.33 CBC significant for MCV 100.2, platelets 136  EMG (04/07/23): NCV & EMG Findings: Extensive electrodiagnostic evaluation of the left lower limb shows: Left sural and superficial peroneal/fibular sensory responses are within normal limits. Left peroneal/fibular (EDB) motor response shows no response. Left tibial (AH) motor response shows reduced amplitude (1.99 mV). Left peroneal/fibular (TA) motor response is within normal limits. Left H reflex is absent. Left rectus femoris and vastus lateralis are unable to be activated and therefore show no motor units. There is no spontaneous activity seen in these muscles. All other tested muscles are within normal  limits with normal motor unit configuration and recruitment patterns.   Impression: This is a complex electrodiagnostic study. The findings are: No activation or motor units are obtained from left quadriceps muscles (rectus femoris and vastus lateralis). There is no abnormal spontaneous activity and iliacus and adductor longus are normal. The lack of activation of the left quadriceps in isolation is of unclear significance and too limited in distribution for diagnostic purposes. Absent and low amplitude nerve conduction studies of the intrinsic foot muscles in isolation are of unclear clinical significance and can be seen as a result of chronic repetitive local trauma from footwear, among other possibilities. No electrodiagnostic evidence of a large fiber sensorimotor neuropathy. No electrodiagnostic evidence of a left lumbosacral plexopathy or left lumbosacral (L2-S1) radiculopathy.  MRI left hip wo contrast (05/30/23): FINDINGS: Extremely low T2 marrow signal intensity throughout the bony structures may be related to renal osteodystrophy or hemosiderosis. Significant artifact associated with the left total hip arthroplasty. Moderate-sized fluid collection on the left consistent with trochanteric bursitis.   As demonstrated on the prior CT scan there are large complex bilateral periarticular masses along with extensive erosive changes involving the right hip which demonstrate relatively low T2 signal intensity. 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   Right-sided iliopsoas bursitis is noted.   The pubic symphysis and SI joints are intact. No pelvic fractures or lesions.   No  significant intrapelvic abnormalities are identified.   IMPRESSION: 1. Low T2 marrow signal intensity throughout the bony structures may be related to renal osteodystrophy or hemosiderosis. 2. 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 3. Moderate-sized fluid collection on the left consistent with trochanteric bursitis.  Previously reviewed results: CBC (02/25/23): significant for chronic anemia and elevated MCV (101.4)   External labs: 12/11/22: IFE: No definite M spike Kappa/lambda ratio wnl (both elevated) A1c: 5.4 TSH: 1.41 Vit D: < 8  11/19/22: BMP significant for elevated glucose (123), Cr (6.84) CBC significant for anemia (Hb 8.6 - chronic), 159 platelets   B12 (05/31/20): 1672 A1c (05/31/20): 4.9 HIV (05/30/20): non-reactive   CT head wo contrast (02/25/23): FINDINGS: Brain: No acute territorial infarction, hemorrhage or intracranial mass. Mild atrophy. Nonenlarged ventricles.   Vascular: No hyperdense vessels.  Carotid vascular calcification   Skull: Normal. Negative for fracture or focal lesion.   Sinuses/Orbits: No acute finding.   Other: None   IMPRESSION: No CT evidence for acute intracranial abnormality. Mild atrophy.   CT chest abdomen pelvis (12/08/22): IMPRESSION: 1. Increased displacement of known pathologic fracture through the left femoral neck, which is now mildly displaced. 2. Multiple lytic lesions within the bilateral proximal femurs as well as the included proximal humeri and scapulae. No additional pathologic fractures. Findings remain most compatible with multiple myeloma or metastatic disease. 3. Complex collection along the course of the distal left iliopsoas tendon has increased from prior, now measuring approximately 8.7 x 4.4 x 4.8 cm, previously 6.9 x 2.2 x 2.9 cm. Findings are favored to represent a complex iliopsoas bursitis. 4. Mildly enlarged right axillary lymph node measuring 12 mm short axis, nonspecific. 5. Multi-cystic kidneys with associated parenchymal atrophy. 6. Enlarged multinodular left thyroid lobe extending into the superior mediastinum. Nonemergent thyroid ultrasound recommended. 7.  Aortic and coronary artery atherosclerosis (ICD10-I70.0).   CT hip left wo contrast (12/04/22): FINDINGS: Bones/Joint/Cartilage   There is an acute nondisplaced pathologic fracture of the left femoral neck, through multiple lytic lesions with adjacent cortical thinning. There is a lytic lesion in the greater trochanter with adjacent cortical thinning. 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 There is a small joint effusion.   There is a small nondisplaced fracture of the left parasymphyseal pubic bone. Tiny chip fracture of the posterior acetabulum   Ligaments   Suboptimally assessed by CT.   Muscles and Tendons   There is a heterogeneous collection in the region of the iliopsoas bursa measuring up to 2.9 x 2.2 x 6.9 cm with some internal fat attenuation.   Soft tissues   Mild soft tissue swelling along the femoral neck.   IMPRESSION: Acute nondisplaced pathologic fracture of the left femoral neck through underlying lytic lesions, highly concerning for metastatic disease or multiple myeloma. Recommend malignancy workup including SPEP/UPEP.   Additional lytic lesion in the greater trochanter and periarticular subchondral bone, some of which are potentially subchondral cysts in the setting of osteoarthritis, but indeterminate with other lesions present.   Heterogeneous fluid collection with internal fat attenuation along the iliopsoas, compatible with iliopsoas lipohemobursitis.   Nondisplaced fracture of the left parasymphyseal pubic bone and tiny chip fracture of the posterior acetabulum.  ASSESSMENT: This is Timothy Ponce, a 67 y.o. male with bilateral hand weakness and numbness fistulas for  dialysis. Symptoms have been worsening. Previous EMG suggested a right median and ulnar neuropathy per ortho  notes, but patient is adamant that extensive testing was done on his left side not the right due to inability to safely test the right due to fistula. The etiology of patient's symptoms is unclear, but there does seem to be distal weakness in multiple nerve distributions including the median, ulnar, and radial branches. 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 We previously discussed work up including repeating EMG with the caveat that his extensive surgeries and deformities in the RUE will make testing limited and difficult. Patient prefers not to go through EMG at this time.  Patient also has left leg weakness since 11/2022. EMG showed no peripheral nerve damage to explain symptoms. MRI of the hip showed bursitis. This has improved.  Plan: -Discussed EMG; patient deferred -Continue home exercise program for weakness.  Return to clinic as needed  Total time spent reviewing records, interview, history/exam, documentation, and coordination of care on day of encounter:  40 min  Jacquelyne Balint, MD

## 2023-09-04 DIAGNOSIS — Z992 Dependence on renal dialysis: Secondary | ICD-10-CM | POA: Diagnosis not present

## 2023-09-04 DIAGNOSIS — N2581 Secondary hyperparathyroidism of renal origin: Secondary | ICD-10-CM | POA: Diagnosis not present

## 2023-09-04 DIAGNOSIS — N186 End stage renal disease: Secondary | ICD-10-CM | POA: Diagnosis not present

## 2023-09-05 DIAGNOSIS — E041 Nontoxic single thyroid nodule: Secondary | ICD-10-CM | POA: Diagnosis not present

## 2023-09-06 DIAGNOSIS — N186 End stage renal disease: Secondary | ICD-10-CM | POA: Diagnosis not present

## 2023-09-06 DIAGNOSIS — N2581 Secondary hyperparathyroidism of renal origin: Secondary | ICD-10-CM | POA: Diagnosis not present

## 2023-09-06 DIAGNOSIS — Z992 Dependence on renal dialysis: Secondary | ICD-10-CM | POA: Diagnosis not present

## 2023-09-09 DIAGNOSIS — N2581 Secondary hyperparathyroidism of renal origin: Secondary | ICD-10-CM | POA: Diagnosis not present

## 2023-09-09 DIAGNOSIS — Z992 Dependence on renal dialysis: Secondary | ICD-10-CM | POA: Diagnosis not present

## 2023-09-09 DIAGNOSIS — N186 End stage renal disease: Secondary | ICD-10-CM | POA: Diagnosis not present

## 2023-09-11 DIAGNOSIS — Z992 Dependence on renal dialysis: Secondary | ICD-10-CM | POA: Diagnosis not present

## 2023-09-11 DIAGNOSIS — N186 End stage renal disease: Secondary | ICD-10-CM | POA: Diagnosis not present

## 2023-09-11 DIAGNOSIS — N2581 Secondary hyperparathyroidism of renal origin: Secondary | ICD-10-CM | POA: Diagnosis not present

## 2023-09-13 DIAGNOSIS — Z992 Dependence on renal dialysis: Secondary | ICD-10-CM | POA: Diagnosis not present

## 2023-09-13 DIAGNOSIS — N186 End stage renal disease: Secondary | ICD-10-CM | POA: Diagnosis not present

## 2023-09-13 DIAGNOSIS — N2581 Secondary hyperparathyroidism of renal origin: Secondary | ICD-10-CM | POA: Diagnosis not present

## 2023-09-15 DIAGNOSIS — N186 End stage renal disease: Secondary | ICD-10-CM | POA: Diagnosis not present

## 2023-09-15 DIAGNOSIS — Z992 Dependence on renal dialysis: Secondary | ICD-10-CM | POA: Diagnosis not present

## 2023-09-15 DIAGNOSIS — I129 Hypertensive chronic kidney disease with stage 1 through stage 4 chronic kidney disease, or unspecified chronic kidney disease: Secondary | ICD-10-CM | POA: Diagnosis not present

## 2023-09-16 DIAGNOSIS — N2581 Secondary hyperparathyroidism of renal origin: Secondary | ICD-10-CM | POA: Diagnosis not present

## 2023-09-16 DIAGNOSIS — M84452D Pathological fracture, left femur, subsequent encounter for fracture with routine healing: Secondary | ICD-10-CM | POA: Diagnosis not present

## 2023-09-16 DIAGNOSIS — Z992 Dependence on renal dialysis: Secondary | ICD-10-CM | POA: Diagnosis not present

## 2023-09-16 DIAGNOSIS — N186 End stage renal disease: Secondary | ICD-10-CM | POA: Diagnosis not present

## 2023-09-17 ENCOUNTER — Ambulatory Visit (INDEPENDENT_AMBULATORY_CARE_PROVIDER_SITE_OTHER): Payer: Medicare PPO | Admitting: Neurology

## 2023-09-17 ENCOUNTER — Encounter: Payer: Self-pay | Admitting: Neurology

## 2023-09-17 VITALS — BP 98/66 | HR 77 | Ht 68.0 in | Wt 126.0 lb

## 2023-09-17 DIAGNOSIS — R29898 Other symptoms and signs involving the musculoskeletal system: Secondary | ICD-10-CM | POA: Diagnosis not present

## 2023-09-17 DIAGNOSIS — Z96642 Presence of left artificial hip joint: Secondary | ICD-10-CM | POA: Diagnosis not present

## 2023-09-17 DIAGNOSIS — R200 Anesthesia of skin: Secondary | ICD-10-CM | POA: Diagnosis not present

## 2023-09-17 NOTE — Patient Instructions (Addendum)
Please be in touch if your symptoms worsen.  We discussed EMG but you preferred to hold off, which is reasonable.   The physicians and staff at [facility] are committed to providing excellent care. You may receive a survey requesting feedback about your experience at our office. We strive to receive "very good" responses to the survey questions. If you feel that your experience would prevent you from giving the office a "very good " response, please contact our office to try to remedy the situation. We may be reached at [phone]. Thank you for taking the time out of your busy day to complete the survey.  Jacquelyne Balint, MD [facility]

## 2023-09-18 DIAGNOSIS — N2581 Secondary hyperparathyroidism of renal origin: Secondary | ICD-10-CM | POA: Diagnosis not present

## 2023-09-18 DIAGNOSIS — N186 End stage renal disease: Secondary | ICD-10-CM | POA: Diagnosis not present

## 2023-09-18 DIAGNOSIS — Z992 Dependence on renal dialysis: Secondary | ICD-10-CM | POA: Diagnosis not present

## 2023-09-20 DIAGNOSIS — N2581 Secondary hyperparathyroidism of renal origin: Secondary | ICD-10-CM | POA: Diagnosis not present

## 2023-09-20 DIAGNOSIS — N186 End stage renal disease: Secondary | ICD-10-CM | POA: Diagnosis not present

## 2023-09-20 DIAGNOSIS — Z992 Dependence on renal dialysis: Secondary | ICD-10-CM | POA: Diagnosis not present

## 2023-09-23 DIAGNOSIS — N186 End stage renal disease: Secondary | ICD-10-CM | POA: Diagnosis not present

## 2023-09-23 DIAGNOSIS — N2581 Secondary hyperparathyroidism of renal origin: Secondary | ICD-10-CM | POA: Diagnosis not present

## 2023-09-23 DIAGNOSIS — Z992 Dependence on renal dialysis: Secondary | ICD-10-CM | POA: Diagnosis not present

## 2023-09-25 DIAGNOSIS — N186 End stage renal disease: Secondary | ICD-10-CM | POA: Diagnosis not present

## 2023-09-25 DIAGNOSIS — Z992 Dependence on renal dialysis: Secondary | ICD-10-CM | POA: Diagnosis not present

## 2023-09-25 DIAGNOSIS — N2581 Secondary hyperparathyroidism of renal origin: Secondary | ICD-10-CM | POA: Diagnosis not present

## 2023-09-27 DIAGNOSIS — N2581 Secondary hyperparathyroidism of renal origin: Secondary | ICD-10-CM | POA: Diagnosis not present

## 2023-09-27 DIAGNOSIS — Z992 Dependence on renal dialysis: Secondary | ICD-10-CM | POA: Diagnosis not present

## 2023-09-27 DIAGNOSIS — N186 End stage renal disease: Secondary | ICD-10-CM | POA: Diagnosis not present

## 2023-09-30 DIAGNOSIS — Z992 Dependence on renal dialysis: Secondary | ICD-10-CM | POA: Diagnosis not present

## 2023-09-30 DIAGNOSIS — N186 End stage renal disease: Secondary | ICD-10-CM | POA: Diagnosis not present

## 2023-09-30 DIAGNOSIS — N2581 Secondary hyperparathyroidism of renal origin: Secondary | ICD-10-CM | POA: Diagnosis not present

## 2023-10-02 DIAGNOSIS — N186 End stage renal disease: Secondary | ICD-10-CM | POA: Diagnosis not present

## 2023-10-02 DIAGNOSIS — N2581 Secondary hyperparathyroidism of renal origin: Secondary | ICD-10-CM | POA: Diagnosis not present

## 2023-10-02 DIAGNOSIS — Z992 Dependence on renal dialysis: Secondary | ICD-10-CM | POA: Diagnosis not present

## 2023-10-04 DIAGNOSIS — N2581 Secondary hyperparathyroidism of renal origin: Secondary | ICD-10-CM | POA: Diagnosis not present

## 2023-10-04 DIAGNOSIS — N186 End stage renal disease: Secondary | ICD-10-CM | POA: Diagnosis not present

## 2023-10-04 DIAGNOSIS — Z992 Dependence on renal dialysis: Secondary | ICD-10-CM | POA: Diagnosis not present

## 2023-10-07 DIAGNOSIS — Z992 Dependence on renal dialysis: Secondary | ICD-10-CM | POA: Diagnosis not present

## 2023-10-07 DIAGNOSIS — N186 End stage renal disease: Secondary | ICD-10-CM | POA: Diagnosis not present

## 2023-10-07 DIAGNOSIS — N2581 Secondary hyperparathyroidism of renal origin: Secondary | ICD-10-CM | POA: Diagnosis not present

## 2023-10-08 ENCOUNTER — Ambulatory Visit (INDEPENDENT_AMBULATORY_CARE_PROVIDER_SITE_OTHER): Payer: Medicare PPO | Admitting: Podiatry

## 2023-10-08 ENCOUNTER — Encounter: Payer: Self-pay | Admitting: Podiatry

## 2023-10-08 DIAGNOSIS — I999 Unspecified disorder of circulatory system: Secondary | ICD-10-CM

## 2023-10-08 DIAGNOSIS — L03032 Cellulitis of left toe: Secondary | ICD-10-CM

## 2023-10-08 MED ORDER — DOXYCYCLINE HYCLATE 100 MG PO TABS: 100.0000 mg | ORAL_TABLET | Freq: Two times a day (BID) | ORAL | 1 refills | Status: DC

## 2023-10-08 NOTE — Progress Notes (Signed)
Patient presents with subjective:   Patient ID: Timothy Ponce, male   DOB: 67 y.o.   MRN: [id number]   HPI Caregiver concerned about discoloration of the left big toenail and history of loss of right fifth digit   ROS      Objective:  Physical Exam  Neurovascular status was found to be intact there is some irritation around the left nail but I did not note any active drainage with good digital perfusion noted no proximal edema erythema noted     Assessment:  Possibility for low-grade infection around the hallux nail left but no apparent indication currently of drainage     Plan:  H&P advised on soaks and I did go ahead and place on doxycycline for 10 days with strict instructions if changes were to occur to reappoint immediately

## 2023-10-09 DIAGNOSIS — N2581 Secondary hyperparathyroidism of renal origin: Secondary | ICD-10-CM | POA: Diagnosis not present

## 2023-10-09 DIAGNOSIS — N186 End stage renal disease: Secondary | ICD-10-CM | POA: Diagnosis not present

## 2023-10-09 DIAGNOSIS — Z992 Dependence on renal dialysis: Secondary | ICD-10-CM | POA: Diagnosis not present

## 2023-10-13 ENCOUNTER — Telehealth: Payer: Self-pay | Admitting: Podiatry

## 2023-10-13 NOTE — Telephone Encounter (Signed)
Hello. This pt called stating the medication that was sent in to his pharmacy makes his very nauseated and he has discontinued use. Hed like to get a call back to see what other alternatives can be offered

## 2023-10-14 DIAGNOSIS — N186 End stage renal disease: Secondary | ICD-10-CM | POA: Diagnosis not present

## 2023-10-14 DIAGNOSIS — N2581 Secondary hyperparathyroidism of renal origin: Secondary | ICD-10-CM | POA: Diagnosis not present

## 2023-10-14 DIAGNOSIS — Z992 Dependence on renal dialysis: Secondary | ICD-10-CM | POA: Diagnosis not present

## 2023-10-16 DIAGNOSIS — N186 End stage renal disease: Secondary | ICD-10-CM | POA: Diagnosis not present

## 2023-10-16 DIAGNOSIS — I129 Hypertensive chronic kidney disease with stage 1 through stage 4 chronic kidney disease, or unspecified chronic kidney disease: Secondary | ICD-10-CM | POA: Diagnosis not present

## 2023-10-16 DIAGNOSIS — Z992 Dependence on renal dialysis: Secondary | ICD-10-CM | POA: Diagnosis not present

## 2023-10-16 DIAGNOSIS — N2581 Secondary hyperparathyroidism of renal origin: Secondary | ICD-10-CM | POA: Diagnosis not present

## 2023-10-16 NOTE — Telephone Encounter (Signed)
No other alternatives

## 2023-10-17 DIAGNOSIS — M84452D Pathological fracture, left femur, subsequent encounter for fracture with routine healing: Secondary | ICD-10-CM | POA: Diagnosis not present

## 2023-10-18 DIAGNOSIS — N2581 Secondary hyperparathyroidism of renal origin: Secondary | ICD-10-CM | POA: Diagnosis not present

## 2023-10-18 DIAGNOSIS — Z992 Dependence on renal dialysis: Secondary | ICD-10-CM | POA: Diagnosis not present

## 2023-10-18 DIAGNOSIS — N186 End stage renal disease: Secondary | ICD-10-CM | POA: Diagnosis not present

## 2023-10-21 DIAGNOSIS — N186 End stage renal disease: Secondary | ICD-10-CM | POA: Diagnosis not present

## 2023-10-21 DIAGNOSIS — Z992 Dependence on renal dialysis: Secondary | ICD-10-CM | POA: Diagnosis not present

## 2023-10-21 DIAGNOSIS — N2581 Secondary hyperparathyroidism of renal origin: Secondary | ICD-10-CM | POA: Diagnosis not present

## 2023-10-23 DIAGNOSIS — Z992 Dependence on renal dialysis: Secondary | ICD-10-CM | POA: Diagnosis not present

## 2023-10-23 DIAGNOSIS — N186 End stage renal disease: Secondary | ICD-10-CM | POA: Diagnosis not present

## 2023-10-23 DIAGNOSIS — N2581 Secondary hyperparathyroidism of renal origin: Secondary | ICD-10-CM | POA: Diagnosis not present

## 2023-10-25 DIAGNOSIS — N2581 Secondary hyperparathyroidism of renal origin: Secondary | ICD-10-CM | POA: Diagnosis not present

## 2023-10-25 DIAGNOSIS — Z992 Dependence on renal dialysis: Secondary | ICD-10-CM | POA: Diagnosis not present

## 2023-10-25 DIAGNOSIS — N186 End stage renal disease: Secondary | ICD-10-CM | POA: Diagnosis not present

## 2023-10-27 DIAGNOSIS — H906 Mixed conductive and sensorineural hearing loss, bilateral: Secondary | ICD-10-CM | POA: Diagnosis not present

## 2023-10-27 DIAGNOSIS — H6523 Chronic serous otitis media, bilateral: Secondary | ICD-10-CM | POA: Insufficient documentation

## 2023-10-28 DIAGNOSIS — N2581 Secondary hyperparathyroidism of renal origin: Secondary | ICD-10-CM | POA: Diagnosis not present

## 2023-10-28 DIAGNOSIS — N186 End stage renal disease: Secondary | ICD-10-CM | POA: Diagnosis not present

## 2023-10-28 DIAGNOSIS — Z992 Dependence on renal dialysis: Secondary | ICD-10-CM | POA: Diagnosis not present

## 2023-10-29 DIAGNOSIS — M25552 Pain in left hip: Secondary | ICD-10-CM | POA: Diagnosis not present

## 2023-10-30 DIAGNOSIS — N186 End stage renal disease: Secondary | ICD-10-CM | POA: Diagnosis not present

## 2023-10-30 DIAGNOSIS — N2581 Secondary hyperparathyroidism of renal origin: Secondary | ICD-10-CM | POA: Diagnosis not present

## 2023-10-30 DIAGNOSIS — Z992 Dependence on renal dialysis: Secondary | ICD-10-CM | POA: Diagnosis not present

## 2023-11-01 DIAGNOSIS — Z992 Dependence on renal dialysis: Secondary | ICD-10-CM | POA: Diagnosis not present

## 2023-11-01 DIAGNOSIS — N2581 Secondary hyperparathyroidism of renal origin: Secondary | ICD-10-CM | POA: Diagnosis not present

## 2023-11-01 DIAGNOSIS — N186 End stage renal disease: Secondary | ICD-10-CM | POA: Diagnosis not present

## 2023-11-04 DIAGNOSIS — Z992 Dependence on renal dialysis: Secondary | ICD-10-CM | POA: Diagnosis not present

## 2023-11-04 DIAGNOSIS — N2581 Secondary hyperparathyroidism of renal origin: Secondary | ICD-10-CM | POA: Diagnosis not present

## 2023-11-04 DIAGNOSIS — N186 End stage renal disease: Secondary | ICD-10-CM | POA: Diagnosis not present

## 2023-11-05 ENCOUNTER — Ambulatory Visit (INDEPENDENT_AMBULATORY_CARE_PROVIDER_SITE_OTHER): Payer: Medicare PPO | Admitting: Podiatry

## 2023-11-05 ENCOUNTER — Encounter: Payer: Self-pay | Admitting: Podiatry

## 2023-11-05 DIAGNOSIS — L03032 Cellulitis of left toe: Secondary | ICD-10-CM | POA: Diagnosis not present

## 2023-11-05 DIAGNOSIS — B351 Tinea unguium: Secondary | ICD-10-CM | POA: Diagnosis not present

## 2023-11-05 DIAGNOSIS — M79674 Pain in right toe(s): Secondary | ICD-10-CM | POA: Diagnosis not present

## 2023-11-05 DIAGNOSIS — I999 Unspecified disorder of circulatory system: Secondary | ICD-10-CM

## 2023-11-05 DIAGNOSIS — M79675 Pain in left toe(s): Secondary | ICD-10-CM | POA: Diagnosis not present

## 2023-11-05 MED ORDER — CEPHALEXIN 500 MG PO CAPS: 500.0000 mg | ORAL_CAPSULE | Freq: Three times a day (TID) | ORAL | 0 refills | Status: DC

## 2023-11-05 NOTE — Addendum Note (Signed)
Addended by: Helane Gunther on: 11/05/2023 06:10 PM   Modules accepted: Level of Service

## 2023-11-05 NOTE — Progress Notes (Signed)
This patient returns to my office for at risk foot care.  This patient requires this care by a professional since this patient will be at risk due to having ESRD, PAD,coagulation defect and amputation fifth toe right foot.  This patient is unable to cut nails himself since the patient cannot reach his nails.These nails are painful walking and wearing shoes.  He says he was seen by Dr.  Charlsie Merles who diagnosed him with paronychia lateral border left great toe. He was prescribed doxycycline but the patient said he was only able to take three doxycycline.He said the medicine upset his stomach but he felt the medicine was helping him. This patient presents for at risk foot care today.  General Appearance  Alert, conversant and in no acute stress.  Vascular  Dorsalis pedis and posterior tibial  pulses are weakly palpable  bilaterally.  Capillary return is within normal limits  bilaterally. Cold feet  bilaterally. Absent digital hair  B/L.  Neurologic  Senn-Weinstein monofilament wire test within normal limits  bilaterally. Muscle power within normal limits bilaterally.  Nails Thick disfigured discolored nails with subungual debris  from hallux to fifth toes left and 1-4 right foot. No evidence of bacterial infection or drainage bilaterally.  Orthopedic  No limitations of motion  feet .  No crepitus or effusions noted.  No bony pathology or digital deformities noted.  Skin  normotropic skin with no porokeratosis noted bilaterally.  No signs of infections or ulcers noted.     Onychomycosis  Pain in right toes  Pain in left toes  Consent was obtained for treatment procedures.   Mechanical debridement of nails 1-5  left foot and 1-4 right foot. performed with a nail nipper.  Filed with dremel without incident. His left hallux nail was painful out of proportion to what I see clinically.  The left hallux was treated after 2% xylocaine plain.  After anesthesia  of his nail  the lateral border was resected.  I noted  drainage  present at the  distal aspect lateral border left hallux.  I also examined his left hallux distally and noted a line of demarcation between darkened skin and normal hallux skin left hallux.      Also the nail has black hematoma proximal nail bed.  Patient was prescribed with cephalexin and told him to peroxide the left hallux.   Patient might be a candidate for vascular studies.  I am trying to treat the toe conservatively.  RTC 2 weeks for infection evaluation.   Return office visit   10  weeks  for nail care.                  Told patient to return for periodic foot care and evaluation due to potential at risk complications.   Helane Gunther DPM

## 2023-11-06 DIAGNOSIS — N186 End stage renal disease: Secondary | ICD-10-CM | POA: Diagnosis not present

## 2023-11-06 DIAGNOSIS — Z992 Dependence on renal dialysis: Secondary | ICD-10-CM | POA: Diagnosis not present

## 2023-11-06 DIAGNOSIS — N2581 Secondary hyperparathyroidism of renal origin: Secondary | ICD-10-CM | POA: Diagnosis not present

## 2023-11-08 DIAGNOSIS — Z992 Dependence on renal dialysis: Secondary | ICD-10-CM | POA: Diagnosis not present

## 2023-11-08 DIAGNOSIS — N186 End stage renal disease: Secondary | ICD-10-CM | POA: Diagnosis not present

## 2023-11-08 DIAGNOSIS — N2581 Secondary hyperparathyroidism of renal origin: Secondary | ICD-10-CM | POA: Diagnosis not present

## 2023-11-10 DIAGNOSIS — Z992 Dependence on renal dialysis: Secondary | ICD-10-CM | POA: Diagnosis not present

## 2023-11-10 DIAGNOSIS — N186 End stage renal disease: Secondary | ICD-10-CM | POA: Diagnosis not present

## 2023-11-10 DIAGNOSIS — N2581 Secondary hyperparathyroidism of renal origin: Secondary | ICD-10-CM | POA: Diagnosis not present

## 2023-11-12 DIAGNOSIS — Z992 Dependence on renal dialysis: Secondary | ICD-10-CM | POA: Diagnosis not present

## 2023-11-12 DIAGNOSIS — N2581 Secondary hyperparathyroidism of renal origin: Secondary | ICD-10-CM | POA: Diagnosis not present

## 2023-11-12 DIAGNOSIS — N186 End stage renal disease: Secondary | ICD-10-CM | POA: Diagnosis not present

## 2023-11-15 DIAGNOSIS — Z992 Dependence on renal dialysis: Secondary | ICD-10-CM | POA: Diagnosis not present

## 2023-11-15 DIAGNOSIS — N186 End stage renal disease: Secondary | ICD-10-CM | POA: Diagnosis not present

## 2023-11-15 DIAGNOSIS — I129 Hypertensive chronic kidney disease with stage 1 through stage 4 chronic kidney disease, or unspecified chronic kidney disease: Secondary | ICD-10-CM | POA: Diagnosis not present

## 2023-11-15 DIAGNOSIS — N2581 Secondary hyperparathyroidism of renal origin: Secondary | ICD-10-CM | POA: Diagnosis not present

## 2023-11-16 DIAGNOSIS — M84452D Pathological fracture, left femur, subsequent encounter for fracture with routine healing: Secondary | ICD-10-CM | POA: Diagnosis not present

## 2023-11-17 DIAGNOSIS — H6523 Chronic serous otitis media, bilateral: Secondary | ICD-10-CM | POA: Diagnosis not present

## 2023-11-18 DIAGNOSIS — N2581 Secondary hyperparathyroidism of renal origin: Secondary | ICD-10-CM | POA: Diagnosis not present

## 2023-11-18 DIAGNOSIS — Z992 Dependence on renal dialysis: Secondary | ICD-10-CM | POA: Diagnosis not present

## 2023-11-18 DIAGNOSIS — N186 End stage renal disease: Secondary | ICD-10-CM | POA: Diagnosis not present

## 2023-11-19 ENCOUNTER — Ambulatory Visit (INDEPENDENT_AMBULATORY_CARE_PROVIDER_SITE_OTHER): Payer: Medicare PPO | Admitting: Podiatry

## 2023-11-19 ENCOUNTER — Encounter: Payer: Self-pay | Admitting: Podiatry

## 2023-11-19 DIAGNOSIS — L03032 Cellulitis of left toe: Secondary | ICD-10-CM | POA: Diagnosis not present

## 2023-11-19 DIAGNOSIS — I999 Unspecified disorder of circulatory system: Secondary | ICD-10-CM | POA: Diagnosis not present

## 2023-11-19 MED ORDER — CEPHALEXIN 500 MG PO CAPS: 500.0000 mg | ORAL_CAPSULE | Freq: Two times a day (BID) | ORAL | 0 refills | Status: AC

## 2023-11-19 NOTE — Progress Notes (Signed)
This patient presents to the office for diagnosis of paronychia outside border left big toe.  He as treated with I and D as well was prescribed cephalexin 500 mg with instructions to take one tid.  He presents to the office today saying his pain has greatly decreased.  He states his pain is 2 out of 10.  He denies drainage from outside border left big toe.  He presents for evaluation and treatment.  General Appearance  Alert, conversant and in no acute stress.  Vascular  Dorsalis pedis and posterior tibial  pulses are  weakly/absent palpable  bilaterally.  Capillary return is within normal limits  bilaterally. Cold feet. bilaterally.  Neurologic  Senn-Weinstein monofilament wire test within normal limits  bilaterally. Muscle power within normal limits bilaterally.  Nails Thick disfigured discolored nails with subungual debris  from hallux to fifth toes left foot and 1-4 right foot.  No evidence of bacterial infection or drainage bilaterally.  Orthopedic  No limitations of motion  feet .  No crepitus or effusions noted.  No bony pathology or digital deformities noted.  Skin  normotropic skin with no porokeratosis noted bilaterally.  No signs of infections or ulcers noted. No redness or inflammation noted at lateral aspect left hallux.  The line of demarcation has resolved and skin is healing and looking better.  Healing paronychia left hallux.  ROV.  Patient toe  is healing.  Patient was prescibed another round of cephalexin .  RTC prn. For infection worsening.    RTC 10 weeks for nail treatment.     Helane Gunther DPM

## 2023-11-20 DIAGNOSIS — Z992 Dependence on renal dialysis: Secondary | ICD-10-CM | POA: Diagnosis not present

## 2023-11-20 DIAGNOSIS — N2581 Secondary hyperparathyroidism of renal origin: Secondary | ICD-10-CM | POA: Diagnosis not present

## 2023-11-20 DIAGNOSIS — N186 End stage renal disease: Secondary | ICD-10-CM | POA: Diagnosis not present

## 2023-11-22 DIAGNOSIS — Z992 Dependence on renal dialysis: Secondary | ICD-10-CM | POA: Diagnosis not present

## 2023-11-22 DIAGNOSIS — N186 End stage renal disease: Secondary | ICD-10-CM | POA: Diagnosis not present

## 2023-11-22 DIAGNOSIS — N2581 Secondary hyperparathyroidism of renal origin: Secondary | ICD-10-CM | POA: Diagnosis not present

## 2023-11-25 DIAGNOSIS — N186 End stage renal disease: Secondary | ICD-10-CM | POA: Diagnosis not present

## 2023-11-25 DIAGNOSIS — Z992 Dependence on renal dialysis: Secondary | ICD-10-CM | POA: Diagnosis not present

## 2023-11-25 DIAGNOSIS — N2581 Secondary hyperparathyroidism of renal origin: Secondary | ICD-10-CM | POA: Diagnosis not present

## 2023-11-26 ENCOUNTER — Other Ambulatory Visit: Payer: Self-pay | Admitting: Nephrology

## 2023-11-26 DIAGNOSIS — M25551 Pain in right hip: Secondary | ICD-10-CM | POA: Diagnosis not present

## 2023-11-26 DIAGNOSIS — I871 Compression of vein: Secondary | ICD-10-CM

## 2023-11-27 DIAGNOSIS — Z992 Dependence on renal dialysis: Secondary | ICD-10-CM | POA: Diagnosis not present

## 2023-11-27 DIAGNOSIS — N186 End stage renal disease: Secondary | ICD-10-CM | POA: Diagnosis not present

## 2023-11-27 DIAGNOSIS — N2581 Secondary hyperparathyroidism of renal origin: Secondary | ICD-10-CM | POA: Diagnosis not present

## 2023-11-29 DIAGNOSIS — N2581 Secondary hyperparathyroidism of renal origin: Secondary | ICD-10-CM | POA: Diagnosis not present

## 2023-11-29 DIAGNOSIS — Z992 Dependence on renal dialysis: Secondary | ICD-10-CM | POA: Diagnosis not present

## 2023-11-29 DIAGNOSIS — N186 End stage renal disease: Secondary | ICD-10-CM | POA: Diagnosis not present

## 2023-12-01 DIAGNOSIS — H6523 Chronic serous otitis media, bilateral: Secondary | ICD-10-CM | POA: Diagnosis not present

## 2023-12-01 DIAGNOSIS — H906 Mixed conductive and sensorineural hearing loss, bilateral: Secondary | ICD-10-CM | POA: Diagnosis not present

## 2023-12-02 DIAGNOSIS — N186 End stage renal disease: Secondary | ICD-10-CM | POA: Diagnosis not present

## 2023-12-02 DIAGNOSIS — N2581 Secondary hyperparathyroidism of renal origin: Secondary | ICD-10-CM | POA: Diagnosis not present

## 2023-12-02 DIAGNOSIS — Z992 Dependence on renal dialysis: Secondary | ICD-10-CM | POA: Diagnosis not present

## 2023-12-04 DIAGNOSIS — Z992 Dependence on renal dialysis: Secondary | ICD-10-CM | POA: Diagnosis not present

## 2023-12-04 DIAGNOSIS — N186 End stage renal disease: Secondary | ICD-10-CM | POA: Diagnosis not present

## 2023-12-04 DIAGNOSIS — N2581 Secondary hyperparathyroidism of renal origin: Secondary | ICD-10-CM | POA: Diagnosis not present

## 2023-12-05 DIAGNOSIS — H401133 Primary open-angle glaucoma, bilateral, severe stage: Secondary | ICD-10-CM | POA: Diagnosis not present

## 2023-12-05 DIAGNOSIS — Z961 Presence of intraocular lens: Secondary | ICD-10-CM | POA: Diagnosis not present

## 2023-12-06 DIAGNOSIS — Z992 Dependence on renal dialysis: Secondary | ICD-10-CM | POA: Diagnosis not present

## 2023-12-06 DIAGNOSIS — N2581 Secondary hyperparathyroidism of renal origin: Secondary | ICD-10-CM | POA: Diagnosis not present

## 2023-12-06 DIAGNOSIS — N186 End stage renal disease: Secondary | ICD-10-CM | POA: Diagnosis not present

## 2023-12-08 DIAGNOSIS — N2581 Secondary hyperparathyroidism of renal origin: Secondary | ICD-10-CM | POA: Diagnosis not present

## 2023-12-08 DIAGNOSIS — N186 End stage renal disease: Secondary | ICD-10-CM | POA: Diagnosis not present

## 2023-12-08 DIAGNOSIS — Z992 Dependence on renal dialysis: Secondary | ICD-10-CM | POA: Diagnosis not present

## 2023-12-11 DIAGNOSIS — Z992 Dependence on renal dialysis: Secondary | ICD-10-CM | POA: Diagnosis not present

## 2023-12-11 DIAGNOSIS — N2581 Secondary hyperparathyroidism of renal origin: Secondary | ICD-10-CM | POA: Diagnosis not present

## 2023-12-11 DIAGNOSIS — N186 End stage renal disease: Secondary | ICD-10-CM | POA: Diagnosis not present

## 2023-12-13 DIAGNOSIS — Z992 Dependence on renal dialysis: Secondary | ICD-10-CM | POA: Diagnosis not present

## 2023-12-13 DIAGNOSIS — N2581 Secondary hyperparathyroidism of renal origin: Secondary | ICD-10-CM | POA: Diagnosis not present

## 2023-12-13 DIAGNOSIS — N186 End stage renal disease: Secondary | ICD-10-CM | POA: Diagnosis not present

## 2023-12-15 DIAGNOSIS — N2581 Secondary hyperparathyroidism of renal origin: Secondary | ICD-10-CM | POA: Diagnosis not present

## 2023-12-15 DIAGNOSIS — N186 End stage renal disease: Secondary | ICD-10-CM | POA: Diagnosis not present

## 2023-12-15 DIAGNOSIS — Z992 Dependence on renal dialysis: Secondary | ICD-10-CM | POA: Diagnosis not present

## 2023-12-16 ENCOUNTER — Encounter: Payer: Self-pay | Admitting: Nephrology

## 2023-12-16 DIAGNOSIS — I129 Hypertensive chronic kidney disease with stage 1 through stage 4 chronic kidney disease, or unspecified chronic kidney disease: Secondary | ICD-10-CM | POA: Diagnosis not present

## 2023-12-16 DIAGNOSIS — N186 End stage renal disease: Secondary | ICD-10-CM | POA: Diagnosis not present

## 2023-12-16 DIAGNOSIS — Z992 Dependence on renal dialysis: Secondary | ICD-10-CM | POA: Diagnosis not present

## 2023-12-17 DIAGNOSIS — M84452D Pathological fracture, left femur, subsequent encounter for fracture with routine healing: Secondary | ICD-10-CM | POA: Diagnosis not present

## 2023-12-18 DIAGNOSIS — N2581 Secondary hyperparathyroidism of renal origin: Secondary | ICD-10-CM | POA: Diagnosis not present

## 2023-12-18 DIAGNOSIS — N186 End stage renal disease: Secondary | ICD-10-CM | POA: Diagnosis not present

## 2023-12-18 DIAGNOSIS — Z992 Dependence on renal dialysis: Secondary | ICD-10-CM | POA: Diagnosis not present

## 2023-12-20 DIAGNOSIS — N186 End stage renal disease: Secondary | ICD-10-CM | POA: Diagnosis not present

## 2023-12-20 DIAGNOSIS — N2581 Secondary hyperparathyroidism of renal origin: Secondary | ICD-10-CM | POA: Diagnosis not present

## 2023-12-20 DIAGNOSIS — Z992 Dependence on renal dialysis: Secondary | ICD-10-CM | POA: Diagnosis not present

## 2023-12-23 DIAGNOSIS — Z992 Dependence on renal dialysis: Secondary | ICD-10-CM | POA: Diagnosis not present

## 2023-12-23 DIAGNOSIS — N186 End stage renal disease: Secondary | ICD-10-CM | POA: Diagnosis not present

## 2023-12-23 DIAGNOSIS — N2581 Secondary hyperparathyroidism of renal origin: Secondary | ICD-10-CM | POA: Diagnosis not present

## 2023-12-24 ENCOUNTER — Ambulatory Visit
Admission: RE | Admit: 2023-12-24 | Discharge: 2023-12-24 | Disposition: A | Discharge status: Home / Self Care | Payer: Medicare PPO | Source: Ambulatory Visit | Attending: Nephrology | Admitting: Nephrology

## 2023-12-24 DIAGNOSIS — R220 Localized swelling, mass and lump, head: Secondary | ICD-10-CM | POA: Diagnosis not present

## 2023-12-24 DIAGNOSIS — I871 Compression of vein: Secondary | ICD-10-CM

## 2023-12-24 DIAGNOSIS — Z992 Dependence on renal dialysis: Secondary | ICD-10-CM | POA: Diagnosis not present

## 2023-12-24 DIAGNOSIS — E049 Nontoxic goiter, unspecified: Secondary | ICD-10-CM | POA: Diagnosis not present

## 2023-12-24 DIAGNOSIS — I82290 Acute embolism and thrombosis of other thoracic veins: Secondary | ICD-10-CM | POA: Diagnosis not present

## 2023-12-24 DIAGNOSIS — R222 Localized swelling, mass and lump, trunk: Secondary | ICD-10-CM | POA: Diagnosis not present

## 2023-12-24 MED ORDER — IOPAMIDOL (ISOVUE-300) INJECTION 61%
200.0000 mL | Freq: Once | INTRAVENOUS | Status: AC | PRN
  Administered 2023-12-24: 100 mL via INTRAVENOUS

## 2023-12-25 DIAGNOSIS — N2581 Secondary hyperparathyroidism of renal origin: Secondary | ICD-10-CM | POA: Diagnosis not present

## 2023-12-25 DIAGNOSIS — Z992 Dependence on renal dialysis: Secondary | ICD-10-CM | POA: Diagnosis not present

## 2023-12-25 DIAGNOSIS — N186 End stage renal disease: Secondary | ICD-10-CM | POA: Diagnosis not present

## 2023-12-26 DIAGNOSIS — N186 End stage renal disease: Secondary | ICD-10-CM | POA: Diagnosis not present

## 2023-12-26 DIAGNOSIS — Z992 Dependence on renal dialysis: Secondary | ICD-10-CM | POA: Diagnosis not present

## 2023-12-26 DIAGNOSIS — N2581 Secondary hyperparathyroidism of renal origin: Secondary | ICD-10-CM | POA: Diagnosis not present

## 2023-12-29 DIAGNOSIS — H6523 Chronic serous otitis media, bilateral: Secondary | ICD-10-CM | POA: Diagnosis not present

## 2023-12-29 DIAGNOSIS — H906 Mixed conductive and sensorineural hearing loss, bilateral: Secondary | ICD-10-CM | POA: Diagnosis not present

## 2023-12-30 DIAGNOSIS — N2581 Secondary hyperparathyroidism of renal origin: Secondary | ICD-10-CM | POA: Diagnosis not present

## 2023-12-30 DIAGNOSIS — N186 End stage renal disease: Secondary | ICD-10-CM | POA: Diagnosis not present

## 2023-12-30 DIAGNOSIS — Z992 Dependence on renal dialysis: Secondary | ICD-10-CM | POA: Diagnosis not present

## 2023-12-31 DIAGNOSIS — Z125 Encounter for screening for malignant neoplasm of prostate: Secondary | ICD-10-CM | POA: Diagnosis not present

## 2023-12-31 DIAGNOSIS — Z0001 Encounter for general adult medical examination with abnormal findings: Secondary | ICD-10-CM | POA: Diagnosis not present

## 2023-12-31 DIAGNOSIS — Z992 Dependence on renal dialysis: Secondary | ICD-10-CM | POA: Diagnosis not present

## 2023-12-31 DIAGNOSIS — D631 Anemia in chronic kidney disease: Secondary | ICD-10-CM | POA: Diagnosis not present

## 2023-12-31 DIAGNOSIS — N186 End stage renal disease: Secondary | ICD-10-CM | POA: Diagnosis not present

## 2023-12-31 DIAGNOSIS — G63 Polyneuropathy in diseases classified elsewhere: Secondary | ICD-10-CM | POA: Diagnosis not present

## 2024-01-01 DIAGNOSIS — Z992 Dependence on renal dialysis: Secondary | ICD-10-CM | POA: Diagnosis not present

## 2024-01-01 DIAGNOSIS — N186 End stage renal disease: Secondary | ICD-10-CM | POA: Diagnosis not present

## 2024-01-01 DIAGNOSIS — N2581 Secondary hyperparathyroidism of renal origin: Secondary | ICD-10-CM | POA: Diagnosis not present

## 2024-01-03 DIAGNOSIS — N2581 Secondary hyperparathyroidism of renal origin: Secondary | ICD-10-CM | POA: Diagnosis not present

## 2024-01-03 DIAGNOSIS — N186 End stage renal disease: Secondary | ICD-10-CM | POA: Diagnosis not present

## 2024-01-03 DIAGNOSIS — Z992 Dependence on renal dialysis: Secondary | ICD-10-CM | POA: Diagnosis not present

## 2024-01-06 DIAGNOSIS — N186 End stage renal disease: Secondary | ICD-10-CM | POA: Diagnosis not present

## 2024-01-06 DIAGNOSIS — N2581 Secondary hyperparathyroidism of renal origin: Secondary | ICD-10-CM | POA: Diagnosis not present

## 2024-01-06 DIAGNOSIS — Z992 Dependence on renal dialysis: Secondary | ICD-10-CM | POA: Diagnosis not present

## 2024-01-08 DIAGNOSIS — Z992 Dependence on renal dialysis: Secondary | ICD-10-CM | POA: Diagnosis not present

## 2024-01-08 DIAGNOSIS — N2581 Secondary hyperparathyroidism of renal origin: Secondary | ICD-10-CM | POA: Diagnosis not present

## 2024-01-08 DIAGNOSIS — N186 End stage renal disease: Secondary | ICD-10-CM | POA: Diagnosis not present

## 2024-01-10 DIAGNOSIS — Z992 Dependence on renal dialysis: Secondary | ICD-10-CM | POA: Diagnosis not present

## 2024-01-10 DIAGNOSIS — N2581 Secondary hyperparathyroidism of renal origin: Secondary | ICD-10-CM | POA: Diagnosis not present

## 2024-01-10 DIAGNOSIS — N186 End stage renal disease: Secondary | ICD-10-CM | POA: Diagnosis not present

## 2024-01-13 DIAGNOSIS — N2581 Secondary hyperparathyroidism of renal origin: Secondary | ICD-10-CM | POA: Diagnosis not present

## 2024-01-13 DIAGNOSIS — Z992 Dependence on renal dialysis: Secondary | ICD-10-CM | POA: Diagnosis not present

## 2024-01-13 DIAGNOSIS — N186 End stage renal disease: Secondary | ICD-10-CM | POA: Diagnosis not present

## 2024-01-15 DIAGNOSIS — N2581 Secondary hyperparathyroidism of renal origin: Secondary | ICD-10-CM | POA: Diagnosis not present

## 2024-01-15 DIAGNOSIS — N186 End stage renal disease: Secondary | ICD-10-CM | POA: Diagnosis not present

## 2024-01-15 DIAGNOSIS — Z992 Dependence on renal dialysis: Secondary | ICD-10-CM | POA: Diagnosis not present

## 2024-01-16 DIAGNOSIS — I129 Hypertensive chronic kidney disease with stage 1 through stage 4 chronic kidney disease, or unspecified chronic kidney disease: Secondary | ICD-10-CM | POA: Diagnosis not present

## 2024-01-16 DIAGNOSIS — N186 End stage renal disease: Secondary | ICD-10-CM | POA: Diagnosis not present

## 2024-01-16 DIAGNOSIS — Z992 Dependence on renal dialysis: Secondary | ICD-10-CM | POA: Diagnosis not present

## 2024-01-17 DIAGNOSIS — N2581 Secondary hyperparathyroidism of renal origin: Secondary | ICD-10-CM | POA: Diagnosis not present

## 2024-01-17 DIAGNOSIS — N186 End stage renal disease: Secondary | ICD-10-CM | POA: Diagnosis not present

## 2024-01-17 DIAGNOSIS — M84452D Pathological fracture, left femur, subsequent encounter for fracture with routine healing: Secondary | ICD-10-CM | POA: Diagnosis not present

## 2024-01-17 DIAGNOSIS — Z992 Dependence on renal dialysis: Secondary | ICD-10-CM | POA: Diagnosis not present

## 2024-01-20 DIAGNOSIS — N186 End stage renal disease: Secondary | ICD-10-CM | POA: Diagnosis not present

## 2024-01-20 DIAGNOSIS — Z992 Dependence on renal dialysis: Secondary | ICD-10-CM | POA: Diagnosis not present

## 2024-01-20 DIAGNOSIS — N2581 Secondary hyperparathyroidism of renal origin: Secondary | ICD-10-CM | POA: Diagnosis not present

## 2024-01-22 DIAGNOSIS — Z992 Dependence on renal dialysis: Secondary | ICD-10-CM | POA: Diagnosis not present

## 2024-01-22 DIAGNOSIS — N186 End stage renal disease: Secondary | ICD-10-CM | POA: Diagnosis not present

## 2024-01-22 DIAGNOSIS — N2581 Secondary hyperparathyroidism of renal origin: Secondary | ICD-10-CM | POA: Diagnosis not present

## 2024-01-24 DIAGNOSIS — N186 End stage renal disease: Secondary | ICD-10-CM | POA: Diagnosis not present

## 2024-01-24 DIAGNOSIS — Z992 Dependence on renal dialysis: Secondary | ICD-10-CM | POA: Diagnosis not present

## 2024-01-24 DIAGNOSIS — N2581 Secondary hyperparathyroidism of renal origin: Secondary | ICD-10-CM | POA: Diagnosis not present

## 2024-01-27 DIAGNOSIS — N2581 Secondary hyperparathyroidism of renal origin: Secondary | ICD-10-CM | POA: Diagnosis not present

## 2024-01-27 DIAGNOSIS — Z992 Dependence on renal dialysis: Secondary | ICD-10-CM | POA: Diagnosis not present

## 2024-01-27 DIAGNOSIS — N186 End stage renal disease: Secondary | ICD-10-CM | POA: Diagnosis not present

## 2024-01-28 ENCOUNTER — Ambulatory Visit (INDEPENDENT_AMBULATORY_CARE_PROVIDER_SITE_OTHER): Payer: Medicare PPO | Admitting: Podiatry

## 2024-01-28 ENCOUNTER — Encounter: Payer: Self-pay | Admitting: Podiatry

## 2024-01-28 VITALS — Ht 68.0 in | Wt 126.0 lb

## 2024-01-28 DIAGNOSIS — B351 Tinea unguium: Secondary | ICD-10-CM | POA: Diagnosis not present

## 2024-01-28 DIAGNOSIS — M79675 Pain in left toe(s): Secondary | ICD-10-CM

## 2024-01-28 DIAGNOSIS — D689 Coagulation defect, unspecified: Secondary | ICD-10-CM

## 2024-01-28 DIAGNOSIS — M79674 Pain in right toe(s): Secondary | ICD-10-CM

## 2024-01-28 NOTE — Progress Notes (Signed)
This patient returns to my office for at risk foot care.  This patient requires this care by a professional since this patient will be at risk due to having ESRD, PAD,coagulation defect and amputation fifth toe right foot.  This patient is unable to cut nails himself since the patient cannot reach his nails.These nails are painful walking and wearing shoes.  This patient presents for at risk foot care today.  General Appearance  Alert, conversant and in no acute stress.  Vascular  Dorsalis pedis and posterior tibial  pulses are weakly palpable  bilaterally.  Capillary return is within normal limits  bilaterally. Cold feet  bilaterally. Absent digital hair  B/L.  Neurologic  Senn-Weinstein monofilament wire test within normal limits  bilaterally. Muscle power within normal limits bilaterally.  Nails Thick disfigured discolored nails with subungual debris  from hallux to fifth toes left and 1-4 right foot. No evidence of bacterial infection or drainage bilaterally.  Orthopedic  No limitations of motion  feet .  No crepitus or effusions noted.  No bony pathology or digital deformities noted.  Skin  normotropic skin with no porokeratosis noted bilaterally.  No signs of infections or ulcers noted.     Onychomycosis  Pain in right toes  Pain in left toes  Consent was obtained for treatment procedures.   Mechanical debridement of nails 1-5  left foot and 1-4 right foot. performed with a nail nipper.  Filed with dremel without incident. His left hallux nail was debrided with dremel tool.  He tolerated dremel tool usage since his infection had resolved.   Return office visit   10 weeks                    Told patient to return for periodic foot care and evaluation due to potential at risk complications.   Helane Gunther DPM

## 2024-01-29 DIAGNOSIS — N2581 Secondary hyperparathyroidism of renal origin: Secondary | ICD-10-CM | POA: Diagnosis not present

## 2024-01-29 DIAGNOSIS — N186 End stage renal disease: Secondary | ICD-10-CM | POA: Diagnosis not present

## 2024-01-29 DIAGNOSIS — Z992 Dependence on renal dialysis: Secondary | ICD-10-CM | POA: Diagnosis not present

## 2024-01-31 DIAGNOSIS — N186 End stage renal disease: Secondary | ICD-10-CM | POA: Diagnosis not present

## 2024-01-31 DIAGNOSIS — Z992 Dependence on renal dialysis: Secondary | ICD-10-CM | POA: Diagnosis not present

## 2024-01-31 DIAGNOSIS — N2581 Secondary hyperparathyroidism of renal origin: Secondary | ICD-10-CM | POA: Diagnosis not present

## 2024-02-03 DIAGNOSIS — N2581 Secondary hyperparathyroidism of renal origin: Secondary | ICD-10-CM | POA: Diagnosis not present

## 2024-02-03 DIAGNOSIS — N186 End stage renal disease: Secondary | ICD-10-CM | POA: Diagnosis not present

## 2024-02-03 DIAGNOSIS — Z992 Dependence on renal dialysis: Secondary | ICD-10-CM | POA: Diagnosis not present

## 2024-02-07 DIAGNOSIS — N2581 Secondary hyperparathyroidism of renal origin: Secondary | ICD-10-CM | POA: Diagnosis not present

## 2024-02-07 DIAGNOSIS — Z992 Dependence on renal dialysis: Secondary | ICD-10-CM | POA: Diagnosis not present

## 2024-02-07 DIAGNOSIS — N186 End stage renal disease: Secondary | ICD-10-CM | POA: Diagnosis not present

## 2024-02-10 DIAGNOSIS — N186 End stage renal disease: Secondary | ICD-10-CM | POA: Diagnosis not present

## 2024-02-10 DIAGNOSIS — Z992 Dependence on renal dialysis: Secondary | ICD-10-CM | POA: Diagnosis not present

## 2024-02-10 DIAGNOSIS — N2581 Secondary hyperparathyroidism of renal origin: Secondary | ICD-10-CM | POA: Diagnosis not present

## 2024-02-12 DIAGNOSIS — N186 End stage renal disease: Secondary | ICD-10-CM | POA: Diagnosis not present

## 2024-02-12 DIAGNOSIS — Z992 Dependence on renal dialysis: Secondary | ICD-10-CM | POA: Diagnosis not present

## 2024-02-12 DIAGNOSIS — N2581 Secondary hyperparathyroidism of renal origin: Secondary | ICD-10-CM | POA: Diagnosis not present

## 2024-02-13 DIAGNOSIS — N186 End stage renal disease: Secondary | ICD-10-CM | POA: Diagnosis not present

## 2024-02-13 DIAGNOSIS — I129 Hypertensive chronic kidney disease with stage 1 through stage 4 chronic kidney disease, or unspecified chronic kidney disease: Secondary | ICD-10-CM | POA: Diagnosis not present

## 2024-02-13 DIAGNOSIS — Z992 Dependence on renal dialysis: Secondary | ICD-10-CM | POA: Diagnosis not present

## 2024-02-14 DIAGNOSIS — Z992 Dependence on renal dialysis: Secondary | ICD-10-CM | POA: Diagnosis not present

## 2024-02-14 DIAGNOSIS — N2581 Secondary hyperparathyroidism of renal origin: Secondary | ICD-10-CM | POA: Diagnosis not present

## 2024-02-14 DIAGNOSIS — N186 End stage renal disease: Secondary | ICD-10-CM | POA: Diagnosis not present

## 2024-02-14 DIAGNOSIS — M84452D Pathological fracture, left femur, subsequent encounter for fracture with routine healing: Secondary | ICD-10-CM | POA: Diagnosis not present

## 2024-02-17 DIAGNOSIS — N2581 Secondary hyperparathyroidism of renal origin: Secondary | ICD-10-CM | POA: Diagnosis not present

## 2024-02-17 DIAGNOSIS — N186 End stage renal disease: Secondary | ICD-10-CM | POA: Diagnosis not present

## 2024-02-17 DIAGNOSIS — Z992 Dependence on renal dialysis: Secondary | ICD-10-CM | POA: Diagnosis not present

## 2024-02-19 DIAGNOSIS — Z992 Dependence on renal dialysis: Secondary | ICD-10-CM | POA: Diagnosis not present

## 2024-02-19 DIAGNOSIS — N2581 Secondary hyperparathyroidism of renal origin: Secondary | ICD-10-CM | POA: Diagnosis not present

## 2024-02-19 DIAGNOSIS — N186 End stage renal disease: Secondary | ICD-10-CM | POA: Diagnosis not present

## 2024-02-21 DIAGNOSIS — Z992 Dependence on renal dialysis: Secondary | ICD-10-CM | POA: Diagnosis not present

## 2024-02-21 DIAGNOSIS — N186 End stage renal disease: Secondary | ICD-10-CM | POA: Diagnosis not present

## 2024-02-21 DIAGNOSIS — N2581 Secondary hyperparathyroidism of renal origin: Secondary | ICD-10-CM | POA: Diagnosis not present

## 2024-02-24 DIAGNOSIS — N186 End stage renal disease: Secondary | ICD-10-CM | POA: Diagnosis not present

## 2024-02-24 DIAGNOSIS — Z992 Dependence on renal dialysis: Secondary | ICD-10-CM | POA: Diagnosis not present

## 2024-02-24 DIAGNOSIS — N2581 Secondary hyperparathyroidism of renal origin: Secondary | ICD-10-CM | POA: Diagnosis not present

## 2024-02-26 DIAGNOSIS — N186 End stage renal disease: Secondary | ICD-10-CM | POA: Diagnosis not present

## 2024-02-26 DIAGNOSIS — Z992 Dependence on renal dialysis: Secondary | ICD-10-CM | POA: Diagnosis not present

## 2024-02-26 DIAGNOSIS — N2581 Secondary hyperparathyroidism of renal origin: Secondary | ICD-10-CM | POA: Diagnosis not present

## 2024-02-27 ENCOUNTER — Encounter (HOSPITAL_COMMUNITY)
Admission: RE | Disposition: A | Discharge status: Home / Self Care | Payer: Self-pay | Source: Home / Self Care | Attending: Nephrology

## 2024-02-27 ENCOUNTER — Ambulatory Visit (HOSPITAL_COMMUNITY)
Admission: RE | Admit: 2024-02-27 | Discharge: 2024-02-27 | Disposition: A | Discharge status: Home / Self Care | Attending: Nephrology | Admitting: Nephrology

## 2024-02-27 ENCOUNTER — Other Ambulatory Visit: Payer: Self-pay

## 2024-02-27 DIAGNOSIS — I871 Compression of vein: Secondary | ICD-10-CM | POA: Insufficient documentation

## 2024-02-27 DIAGNOSIS — Z992 Dependence on renal dialysis: Secondary | ICD-10-CM | POA: Diagnosis not present

## 2024-02-27 DIAGNOSIS — Y832 Surgical operation with anastomosis, bypass or graft as the cause of abnormal reaction of the patient, or of later complication, without mention of misadventure at the time of the procedure: Secondary | ICD-10-CM | POA: Diagnosis not present

## 2024-02-27 DIAGNOSIS — N186 End stage renal disease: Secondary | ICD-10-CM | POA: Diagnosis not present

## 2024-02-27 DIAGNOSIS — T82510A Breakdown (mechanical) of surgically created arteriovenous fistula, initial encounter: Secondary | ICD-10-CM | POA: Diagnosis not present

## 2024-02-27 DIAGNOSIS — T82858A Stenosis of vascular prosthetic devices, implants and grafts, initial encounter: Secondary | ICD-10-CM | POA: Diagnosis not present

## 2024-02-27 LAB — POCT I-STAT, CHEM 8
BUN: 22 mg/dL (ref 8–23)
Calcium, Ion: 1.11 mmol/L — ABNORMAL LOW (ref 1.15–1.40)
Chloride: 95 mmol/L — ABNORMAL LOW (ref 98–111)
Creatinine, Ser: 7.5 mg/dL — ABNORMAL HIGH (ref 0.61–1.24)
Glucose, Bld: 92 mg/dL (ref 70–99)
HCT: 37 % — ABNORMAL LOW (ref 39.0–52.0)
Hemoglobin: 12.6 g/dL — ABNORMAL LOW (ref 13.0–17.0)
Potassium: 3.9 mmol/L (ref 3.5–5.1)
Sodium: 137 mmol/L (ref 135–145)
TCO2: 31 mmol/L (ref 22–32)

## 2024-02-27 SURGERY — A/V FISTULAGRAM
Anesthesia: LOCAL

## 2024-02-27 MED ORDER — MIDAZOLAM HCL 2 MG/2ML IJ SOLN
INTRAMUSCULAR | Status: AC
  Filled 2024-02-27: qty 2

## 2024-02-27 MED ORDER — SODIUM CHLORIDE 0.9 % IV SOLN: INTRAVENOUS | Status: DC

## 2024-02-27 MED ORDER — ONDANSETRON HCL 4 MG/2ML IJ SOLN: 4.0000 mg | Freq: Four times a day (QID) | INTRAMUSCULAR | Status: DC | PRN

## 2024-02-27 MED ORDER — IODIXANOL 320 MG/ML IV SOLN
INTRAVENOUS | Status: DC | PRN
  Administered 2024-02-27: 12 mL via INTRAVENOUS

## 2024-02-27 MED ORDER — LIDOCAINE HCL (PF) 1 % IJ SOLN
INTRAMUSCULAR | Status: AC
  Filled 2024-02-27: qty 30

## 2024-02-27 MED ORDER — LIDOCAINE HCL (PF) 1 % IJ SOLN
INTRAMUSCULAR | Status: DC | PRN
  Administered 2024-02-27: 2 mL via SUBCUTANEOUS

## 2024-02-27 MED ORDER — ACETAMINOPHEN 325 MG PO TABS: 650.0000 mg | ORAL_TABLET | ORAL | Status: DC | PRN

## 2024-02-27 MED ORDER — FENTANYL CITRATE (PF) 100 MCG/2ML IJ SOLN
INTRAMUSCULAR | Status: AC
  Filled 2024-02-27: qty 2

## 2024-02-27 MED ORDER — HEPARIN (PORCINE) IN NACL 1000-0.9 UT/500ML-% IV SOLN
INTRAVENOUS | Status: DC | PRN
  Administered 2024-02-27: 500 mL

## 2024-02-27 SURGICAL SUPPLY — 3 items
COVER DOME SNAP 22 D (MISCELLANEOUS) ×1 IMPLANT
TRAY PV CATH (CUSTOM PROCEDURE TRAY) ×1 IMPLANT
WIRE MICRO SET SILHO 5FR 7 (SHEATH) IMPLANT

## 2024-02-27 NOTE — Op Note (Addendum)
 Patient presents with decreased access flows of his right RCF (placed ~18y ago). On exam his RC AVF is pulsatile with a decent bruit.  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   Kt/V has been adequate.  Reports has always required for hemostasis after HD and that has not recently changed.   Summary:  1)      The body of the cephalic vein fistula, inflow, and arch were patent with good flows. 2)      Central vessels appear occluded.  Due to no symptoms of central venous obstruction did not perform dedicated or extensive imaging. 3)   AVF remains amenable to limited percutaneous intervention.   Description of procedure: The arm was prepped and draped in the usual sterile fashion. The left upper arm brachial cephalic fistula was cannulated (16109) with an 21G micropuncture needle directed in an antegrade direction. A guidewire was inserted and exchanged for a 5Fr sheath. Contrast [phone] injection via the side port of the sheath was performed. The angiogram of the fistula (09811) showed an aneurysmal body of the left RCF.  Outflow mainly through medial system which is widely patent, cephalic upper arm vein is smaller and tortuous but patent.  Extensive collaterals in the chest with reflux up internal jugular.  May have limited SVC flow.  No clinical indication for additional imaging as asymptomatic.  Due to normal augmentation and large aneurysm make retrograde imaging challenging I elected not to attempt retrograde assessment of inflow.  Do not suspect inflow stenosis.  Hemostasis: A 3-0 ethilon purse string suture was placed at the cannulation site on removal of the sheath.  Sedation: none Sedation time. N/a  Contrast. 12 mL  Monitoring: Because of the patient's comorbid conditions and sedation during the procedure, continuous EKG monitoring and O2 saturation  monitoring was performed throughout the procedure by the RN. There were no abnormal arrhythmias encountered.  Complications: None.   Diagnoses: I87.1 Stricture of vein  N18.6 ESRD T82.858A Stricture of access  Procedure Coding:  36901 Cannulation and angiogram of fistula Q9967 Contrast  Recommendations:  1. Continue to cannulate the fistula with 15G needles.  2. Refer back for problems with flows. 3. Remove the suture next treatment.   Discharge: The patient was discharged home in stable condition. The patient was given education regarding the care of the dialysis access AVF and specific instructions in case of any problems.

## 2024-02-27 NOTE — Discharge Instructions (Signed)
 General care instructions: - You should be able to eat, drink, and resume your normal medications. - Avoid any strenuous activity for the remainder of the day. Potential complications: - Your hand is more cold or numb than usual. - You are bleeding at the site and it will not stop with direct pressure. If it was a declot expect some oozing at the site. Avoid extreme pressure to the site. - You have a change in the bruit and /or thrill in your fistula or graft. - You have a fever, swelling, see redness or feel heat at or near the puncture site. Medication instructions: - Continue routine medications unless otherwise instructed. 4. Please have your sutures removed at your next scheduled dialysis treatment.

## 2024-03-01 ENCOUNTER — Encounter (HOSPITAL_COMMUNITY): Payer: Self-pay | Admitting: Internal Medicine

## 2024-03-02 DIAGNOSIS — N2581 Secondary hyperparathyroidism of renal origin: Secondary | ICD-10-CM | POA: Diagnosis not present

## 2024-03-02 DIAGNOSIS — Z992 Dependence on renal dialysis: Secondary | ICD-10-CM | POA: Diagnosis not present

## 2024-03-02 DIAGNOSIS — N186 End stage renal disease: Secondary | ICD-10-CM | POA: Diagnosis not present

## 2024-03-04 DIAGNOSIS — N186 End stage renal disease: Secondary | ICD-10-CM | POA: Diagnosis not present

## 2024-03-04 DIAGNOSIS — Z992 Dependence on renal dialysis: Secondary | ICD-10-CM | POA: Diagnosis not present

## 2024-03-04 DIAGNOSIS — N2581 Secondary hyperparathyroidism of renal origin: Secondary | ICD-10-CM | POA: Diagnosis not present

## 2024-03-06 DIAGNOSIS — N2581 Secondary hyperparathyroidism of renal origin: Secondary | ICD-10-CM | POA: Diagnosis not present

## 2024-03-06 DIAGNOSIS — N186 End stage renal disease: Secondary | ICD-10-CM | POA: Diagnosis not present

## 2024-03-06 DIAGNOSIS — Z992 Dependence on renal dialysis: Secondary | ICD-10-CM | POA: Diagnosis not present

## 2024-03-09 DIAGNOSIS — Z992 Dependence on renal dialysis: Secondary | ICD-10-CM | POA: Diagnosis not present

## 2024-03-09 DIAGNOSIS — N2581 Secondary hyperparathyroidism of renal origin: Secondary | ICD-10-CM | POA: Diagnosis not present

## 2024-03-09 DIAGNOSIS — N186 End stage renal disease: Secondary | ICD-10-CM | POA: Diagnosis not present

## 2024-03-11 DIAGNOSIS — N2581 Secondary hyperparathyroidism of renal origin: Secondary | ICD-10-CM | POA: Diagnosis not present

## 2024-03-11 DIAGNOSIS — Z992 Dependence on renal dialysis: Secondary | ICD-10-CM | POA: Diagnosis not present

## 2024-03-11 DIAGNOSIS — N186 End stage renal disease: Secondary | ICD-10-CM | POA: Diagnosis not present

## 2024-03-13 DIAGNOSIS — N186 End stage renal disease: Secondary | ICD-10-CM | POA: Diagnosis not present

## 2024-03-13 DIAGNOSIS — Z992 Dependence on renal dialysis: Secondary | ICD-10-CM | POA: Diagnosis not present

## 2024-03-13 DIAGNOSIS — N2581 Secondary hyperparathyroidism of renal origin: Secondary | ICD-10-CM | POA: Diagnosis not present

## 2024-03-15 DIAGNOSIS — I129 Hypertensive chronic kidney disease with stage 1 through stage 4 chronic kidney disease, or unspecified chronic kidney disease: Secondary | ICD-10-CM | POA: Diagnosis not present

## 2024-03-15 DIAGNOSIS — N186 End stage renal disease: Secondary | ICD-10-CM | POA: Diagnosis not present

## 2024-03-15 DIAGNOSIS — Z992 Dependence on renal dialysis: Secondary | ICD-10-CM | POA: Diagnosis not present

## 2024-03-16 DIAGNOSIS — Z992 Dependence on renal dialysis: Secondary | ICD-10-CM | POA: Diagnosis not present

## 2024-03-16 DIAGNOSIS — N186 End stage renal disease: Secondary | ICD-10-CM | POA: Diagnosis not present

## 2024-03-16 DIAGNOSIS — N2581 Secondary hyperparathyroidism of renal origin: Secondary | ICD-10-CM | POA: Diagnosis not present

## 2024-03-17 DIAGNOSIS — H401133 Primary open-angle glaucoma, bilateral, severe stage: Secondary | ICD-10-CM | POA: Diagnosis not present

## 2024-03-17 DIAGNOSIS — Z961 Presence of intraocular lens: Secondary | ICD-10-CM | POA: Diagnosis not present

## 2024-03-18 DIAGNOSIS — N2581 Secondary hyperparathyroidism of renal origin: Secondary | ICD-10-CM | POA: Diagnosis not present

## 2024-03-18 DIAGNOSIS — Z992 Dependence on renal dialysis: Secondary | ICD-10-CM | POA: Diagnosis not present

## 2024-03-18 DIAGNOSIS — N186 End stage renal disease: Secondary | ICD-10-CM | POA: Diagnosis not present

## 2024-03-20 DIAGNOSIS — Z992 Dependence on renal dialysis: Secondary | ICD-10-CM | POA: Diagnosis not present

## 2024-03-20 DIAGNOSIS — N2581 Secondary hyperparathyroidism of renal origin: Secondary | ICD-10-CM | POA: Diagnosis not present

## 2024-03-20 DIAGNOSIS — N186 End stage renal disease: Secondary | ICD-10-CM | POA: Diagnosis not present

## 2024-03-23 DIAGNOSIS — Z992 Dependence on renal dialysis: Secondary | ICD-10-CM | POA: Diagnosis not present

## 2024-03-23 DIAGNOSIS — N186 End stage renal disease: Secondary | ICD-10-CM | POA: Diagnosis not present

## 2024-03-23 DIAGNOSIS — N2581 Secondary hyperparathyroidism of renal origin: Secondary | ICD-10-CM | POA: Diagnosis not present

## 2024-03-25 DIAGNOSIS — N2581 Secondary hyperparathyroidism of renal origin: Secondary | ICD-10-CM | POA: Diagnosis not present

## 2024-03-25 DIAGNOSIS — N186 End stage renal disease: Secondary | ICD-10-CM | POA: Diagnosis not present

## 2024-03-25 DIAGNOSIS — Z992 Dependence on renal dialysis: Secondary | ICD-10-CM | POA: Diagnosis not present

## 2024-03-26 DIAGNOSIS — Z992 Dependence on renal dialysis: Secondary | ICD-10-CM | POA: Diagnosis not present

## 2024-03-26 DIAGNOSIS — N186 End stage renal disease: Secondary | ICD-10-CM | POA: Diagnosis not present

## 2024-03-26 DIAGNOSIS — N2581 Secondary hyperparathyroidism of renal origin: Secondary | ICD-10-CM | POA: Diagnosis not present

## 2024-03-27 DIAGNOSIS — N186 End stage renal disease: Secondary | ICD-10-CM | POA: Diagnosis not present

## 2024-03-27 DIAGNOSIS — Z992 Dependence on renal dialysis: Secondary | ICD-10-CM | POA: Diagnosis not present

## 2024-03-27 DIAGNOSIS — N2581 Secondary hyperparathyroidism of renal origin: Secondary | ICD-10-CM | POA: Diagnosis not present

## 2024-03-30 DIAGNOSIS — Z992 Dependence on renal dialysis: Secondary | ICD-10-CM | POA: Diagnosis not present

## 2024-03-30 DIAGNOSIS — N186 End stage renal disease: Secondary | ICD-10-CM | POA: Diagnosis not present

## 2024-03-30 DIAGNOSIS — N2581 Secondary hyperparathyroidism of renal origin: Secondary | ICD-10-CM | POA: Diagnosis not present

## 2024-03-31 NOTE — H&P (Signed)
 [facility]  INPATIENT CONSULTATION  Reason for Consultation: low AF Requesting Provider: Dr. Jearldine Mina  HPI: Timothy Ponce is an 68 y.o. male with ESRD on HD via 18+y old RC AVF who presents for evaluation of low AF.    Pt has 68yo RC AVF for low AF.  HIs Kt/V has been adequate.  He has 2 aneurysms in the cannulation zone which have both been revised (distal 2022 and prox 2023).  He has chronic dilated chest veins. Hemostasis chronically.    PMH: Past Medical History:  Diagnosis Date   Cataract    Complication of anesthesia    "Awakens during surgery"   Dyspnea    ESRD (end stage renal disease) (HCC)    [facility], [address]    Glaucoma    Neuropathy    hands - per patient   PSH: Past Surgical History:  Procedure Laterality Date   A/V FISTULAGRAM N/A 02/27/2024   Procedure: A/V Fistulagram;  Surgeon: Baron Border, MD;  Location: [facility] INVASIVE CV LAB;  Service: Cardiovascular;  Laterality: N/A;   AMPUTATION Right 06/02/2020   Procedure: RIGHT FOOT 5TH RAY AMPUTATION;  Surgeon: Timothy Ford, MD;  Location: [facility] OR;  Service: Orthopedics;  Laterality: Right;   AV FISTULA PLACEMENT     EYE SURGERY     cataract bilateral   EYE SURGERY Right    FISTULA SUPERFICIALIZATION Right 10/28/2022   Procedure: RIGHT ARM ARTERIOVENOUS FISTULA REVISION WITH COMPLEX SKIN AND SOFT TISSUE CLOSURE;  Surgeon: Kayla Part, MD;  Location: [facility] OR;  Service: Vascular;  Laterality: Right;   INSERTION OF DIALYSIS CATHETER Right 12/08/2020   Procedure: INSERTION OF DIALYSIS CATHETER USING 19cm PALINDROME CHRONIC CATHETER;  Surgeon: Adine Hoof, MD;  Location: [facility] OR;  Service: Vascular;  Laterality: Right;   INSERTION OF DIALYSIS CATHETER Right 10/28/2022   Procedure: INSERTION OF RIGHT INTERNAL JUGULAR TUNNELED DIALYSIS CATHETER;  Surgeon: Kayla Part, MD;  Location: [facility] OR;  Service: Vascular;  Laterality: Right;   REVISON OF ARTERIOVENOUS FISTULA Right  12/06/2020   Procedure: REVISON OF RIGHT ARM FISTULA;  Surgeon: Mayo Speck, MD;  Location: [facility] OR;  Service: Vascular;  Laterality: Right;   TOTAL HIP ARTHROPLASTY Right     Past Medical History:  Diagnosis Date   Cataract    Complication of anesthesia    "Awakens during surgery"   Dyspnea    ESRD (end stage renal disease) (HCC)    [facility], [address]    Glaucoma    Neuropathy    hands - per patient    Medications:  I have reviewed the patient's current medications.  No medications prior to admission.    ALLERGIES:   Allergies  Allergen Reactions   Iron Dextran Shortness Of Breath    Infed Iron Infusion    Darvon [Propoxyphene] Other (See Comments)    Lungs filling up with fluid (reaction to Darvon/Darvocet)     Lactose Other (See Comments)    Gi upset/ sugar in milk   Milk (Cow) Other (See Comments)    Gi upset    FAM HX: No family history on file.  Social History:   reports that he has never smoked. He has never been exposed to tobacco smoke. He has never used smokeless tobacco. He reports that he does not drink alcohol and does not use drugs.  ROS: 12 system ROS neg except per HPI  Blood pressure (!) 145/98, pulse 66, temperature (!) 97.5 F (36.4 C),  temperature source Oral, resp. rate 10, height 5\' 8"  (1.727 m), weight 58 kg, SpO2 94%. PHYSICAL EXAM: Gen: chronically ill appearing but nontoxic  Eyes: blind ENT: MMM Neck: dilated chest veins noted CV: RRR on monitor Back: clear on RA Extr: L RC AVF pulsatile with a decent bruit. 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  No results found.  Assessment/PlanMichael D Ponce is an 68 y.o. male with ESRD on HD via 18+y old RC AVF who presents for evaluation of low AF.    **ESRD with HD access malfunction:  68yo AVF with evidence for outflow and central stenosis. After reviewing risks,  benefit, alternatives he is consented for angiogram with angioplasty as indicated under conscious sedation with appropriate monitoring.   Baron Border 03/31/2024, 3:40 PM

## 2024-04-01 DIAGNOSIS — N2581 Secondary hyperparathyroidism of renal origin: Secondary | ICD-10-CM | POA: Diagnosis not present

## 2024-04-01 DIAGNOSIS — Z992 Dependence on renal dialysis: Secondary | ICD-10-CM | POA: Diagnosis not present

## 2024-04-01 DIAGNOSIS — N186 End stage renal disease: Secondary | ICD-10-CM | POA: Diagnosis not present

## 2024-04-03 DIAGNOSIS — N2581 Secondary hyperparathyroidism of renal origin: Secondary | ICD-10-CM | POA: Diagnosis not present

## 2024-04-03 DIAGNOSIS — N186 End stage renal disease: Secondary | ICD-10-CM | POA: Diagnosis not present

## 2024-04-03 DIAGNOSIS — Z992 Dependence on renal dialysis: Secondary | ICD-10-CM | POA: Diagnosis not present

## 2024-04-05 DIAGNOSIS — E041 Nontoxic single thyroid nodule: Secondary | ICD-10-CM | POA: Diagnosis not present

## 2024-04-06 DIAGNOSIS — Z992 Dependence on renal dialysis: Secondary | ICD-10-CM | POA: Diagnosis not present

## 2024-04-06 DIAGNOSIS — N2581 Secondary hyperparathyroidism of renal origin: Secondary | ICD-10-CM | POA: Diagnosis not present

## 2024-04-06 DIAGNOSIS — N186 End stage renal disease: Secondary | ICD-10-CM | POA: Diagnosis not present

## 2024-04-07 ENCOUNTER — Ambulatory Visit (INDEPENDENT_AMBULATORY_CARE_PROVIDER_SITE_OTHER): Payer: Medicare PPO | Admitting: Podiatry

## 2024-04-07 ENCOUNTER — Encounter: Payer: Self-pay | Admitting: Podiatry

## 2024-04-07 DIAGNOSIS — I999 Unspecified disorder of circulatory system: Secondary | ICD-10-CM | POA: Diagnosis not present

## 2024-04-07 DIAGNOSIS — L03032 Cellulitis of left toe: Secondary | ICD-10-CM | POA: Diagnosis not present

## 2024-04-07 DIAGNOSIS — D689 Coagulation defect, unspecified: Secondary | ICD-10-CM

## 2024-04-07 MED ORDER — CEPHALEXIN 500 MG PO CAPS: 500.0000 mg | ORAL_CAPSULE | Freq: Two times a day (BID) | ORAL | 0 refills | Status: AC

## 2024-04-07 NOTE — Progress Notes (Signed)
  Subjective:  Patient ID: Timothy Ponce, male    DOB: May 27, 1956,   MRN: [id number]  No chief complaint on file.   68 y.o. male presents for concern of routine foot care with Dr. Zettie Hillock however upon examination noted to have painful ingrown left hallux with necrosis surroudning. This has been an ongoing issue for him and has been on antibiotics in the past that have heped with pain. Patient in need of further care so I was consulted. Currently having significant pain in toe.  . Denies any other pedal complaints. Denies n/v/f/c.   Past Medical History:  Diagnosis Date   Cataract    Complication of anesthesia    "Awakens during surgery"   Dyspnea    ESRD (end stage renal disease) (HCC)    [facility], [address]    Glaucoma    Neuropathy    hands - per patient    Objective:  Physical Exam: Vascular: DP/PT pulses 1/4 bilateral. CFT <3 seconds. Absent hair growth on digits. Edema noted to bilateral lower extremities. Xerosis noted bilaterally.  Skin. No lacerations or abrasions bilateral feet. Nails 1-5 bilateral  are thickened discolored and elongated with subungual debris. Left hallux nail with distal lateral incurvation and upon minimal debridment noted wound and purulence expressed from area. Some surroudning duskiness and darkening of the toe Musculoskeletal: MMT 5/5 bilateral lower extremities in DF, PF, Inversion and Eversion. Deceased ROM in DF of ankle joint.  Neurological: Sensation intact to light touch. Protective sensation diminished bilateral.    Assessment:   1. Coagulation defect, unspecified (HCC)   2. Paronychia of great toe of left foot   3. Vascular disease      Plan:  Patient was evaluated and treated and all questions answered. Discussed ingrown toenails etiology and treatment options including procedure for removal vs conservative care.  -Debridement minimal to nail in slant back fashion -Dressed with betadine, DSD. -ABIs ordered to asses for blood flow  given findings and concern. Discussed removal of nail but will need to make sure he has enough circulation  -Keflex  sent to pharmacy  -Discussed glucose control and proper protein-rich diet.  -Discussed if any worsening redness, pain, fever or chills to call or may need to report to the emergency room. Patient expressed understanding.    Patient to return in 2 week for follow-up.  Return in about 2 weeks (around 04/21/2024) for wound check, nail check.   Jennefer Moats, DPM

## 2024-04-08 DIAGNOSIS — N2581 Secondary hyperparathyroidism of renal origin: Secondary | ICD-10-CM | POA: Diagnosis not present

## 2024-04-08 DIAGNOSIS — N186 End stage renal disease: Secondary | ICD-10-CM | POA: Diagnosis not present

## 2024-04-08 DIAGNOSIS — Z992 Dependence on renal dialysis: Secondary | ICD-10-CM | POA: Diagnosis not present

## 2024-04-10 DIAGNOSIS — Z992 Dependence on renal dialysis: Secondary | ICD-10-CM | POA: Diagnosis not present

## 2024-04-10 DIAGNOSIS — N2581 Secondary hyperparathyroidism of renal origin: Secondary | ICD-10-CM | POA: Diagnosis not present

## 2024-04-10 DIAGNOSIS — N186 End stage renal disease: Secondary | ICD-10-CM | POA: Diagnosis not present

## 2024-04-13 DIAGNOSIS — N2581 Secondary hyperparathyroidism of renal origin: Secondary | ICD-10-CM | POA: Diagnosis not present

## 2024-04-13 DIAGNOSIS — N186 End stage renal disease: Secondary | ICD-10-CM | POA: Diagnosis not present

## 2024-04-13 DIAGNOSIS — Z992 Dependence on renal dialysis: Secondary | ICD-10-CM | POA: Diagnosis not present

## 2024-04-14 DIAGNOSIS — I129 Hypertensive chronic kidney disease with stage 1 through stage 4 chronic kidney disease, or unspecified chronic kidney disease: Secondary | ICD-10-CM | POA: Diagnosis not present

## 2024-04-14 DIAGNOSIS — N186 End stage renal disease: Secondary | ICD-10-CM | POA: Diagnosis not present

## 2024-04-14 DIAGNOSIS — Z992 Dependence on renal dialysis: Secondary | ICD-10-CM | POA: Diagnosis not present

## 2024-04-15 DIAGNOSIS — N2581 Secondary hyperparathyroidism of renal origin: Secondary | ICD-10-CM | POA: Diagnosis not present

## 2024-04-15 DIAGNOSIS — Z992 Dependence on renal dialysis: Secondary | ICD-10-CM | POA: Diagnosis not present

## 2024-04-15 DIAGNOSIS — N186 End stage renal disease: Secondary | ICD-10-CM | POA: Diagnosis not present

## 2024-04-17 DIAGNOSIS — N2581 Secondary hyperparathyroidism of renal origin: Secondary | ICD-10-CM | POA: Diagnosis not present

## 2024-04-17 DIAGNOSIS — N186 End stage renal disease: Secondary | ICD-10-CM | POA: Diagnosis not present

## 2024-04-17 DIAGNOSIS — Z992 Dependence on renal dialysis: Secondary | ICD-10-CM | POA: Diagnosis not present

## 2024-04-20 DIAGNOSIS — N2581 Secondary hyperparathyroidism of renal origin: Secondary | ICD-10-CM | POA: Diagnosis not present

## 2024-04-20 DIAGNOSIS — N186 End stage renal disease: Secondary | ICD-10-CM | POA: Diagnosis not present

## 2024-04-20 DIAGNOSIS — Z992 Dependence on renal dialysis: Secondary | ICD-10-CM | POA: Diagnosis not present

## 2024-04-21 ENCOUNTER — Ambulatory Visit (INDEPENDENT_AMBULATORY_CARE_PROVIDER_SITE_OTHER): Admitting: Podiatry

## 2024-04-21 DIAGNOSIS — D689 Coagulation defect, unspecified: Secondary | ICD-10-CM | POA: Diagnosis not present

## 2024-04-21 DIAGNOSIS — L03032 Cellulitis of left toe: Secondary | ICD-10-CM | POA: Diagnosis not present

## 2024-04-21 NOTE — Progress Notes (Addendum)
  Subjective:  Patient ID: Timothy Ponce, male    DOB: 1956-01-13,   MRN: [id number]  No chief complaint on file.   68 y.o. male presents for follow-up of paronychia of left great toe. Relates doing a bit better. Has taken antibiotics and been dressing toe. He has not had ABIs yet they are scheduled for 5/28.  Timothy Ponce Denies any other pedal complaints. Denies n/v/f/c.   Past Medical History:  Diagnosis Date   Cataract    Complication of anesthesia    "Awakens during surgery"   Dyspnea    ESRD (end stage renal disease) (HCC)    [facility], [address]    Glaucoma    Neuropathy    hands - per patient    Objective:  Physical Exam: Vascular: DP/PT pulses 1/4 bilateral. CFT <3 seconds. Absent hair growth on digits. Edema noted to bilateral lower extremities. Xerosis noted bilaterally.  Skin. No lacerations or abrasions bilateral feet. Nails 1-5 bilateral  are thickened discolored and elongated with subungual debris. Left hallux nail with distal lateral incurvation and upon minimal debridment noted wound and purulence expressed from area. Some surroudning duskiness and darkening of the toe Musculoskeletal: MMT 5/5 bilateral lower extremities in DF, PF, Inversion and Eversion. Deceased ROM in DF of ankle joint.  Neurological: Sensation intact to light touch. Protective sensation diminished bilateral.    Assessment:   1. Paronychia of great toe of left foot   2. Coagulation defect, unspecified (HCC)       Plan:  Patient was evaluated and treated and all questions answered. Discussed ingrown toenails etiology and treatment options including procedure for removal vs conservative care.  -Debridement minimal to nail in slant back fashion -Dressed with betadine, DSD. -Post-op shoe dispensed to offload toe.  -ABIs ordered awaiting results. If normal will go ahead and plan for nail removal if not will refer to vascular.  -Discussed glucose control and proper protein-rich diet.  -Discussed if  any worsening redness, pain, fever or chills to call or may need to report to the emergency room. Patient expressed understanding.    Patient to return after vascular procedure for nail removal.   Return in about 4 weeks (around 05/19/2024).   Jennefer Moats, DPM

## 2024-04-22 DIAGNOSIS — Z992 Dependence on renal dialysis: Secondary | ICD-10-CM | POA: Diagnosis not present

## 2024-04-22 DIAGNOSIS — N186 End stage renal disease: Secondary | ICD-10-CM | POA: Diagnosis not present

## 2024-04-22 DIAGNOSIS — N2581 Secondary hyperparathyroidism of renal origin: Secondary | ICD-10-CM | POA: Diagnosis not present

## 2024-04-24 DIAGNOSIS — N186 End stage renal disease: Secondary | ICD-10-CM | POA: Diagnosis not present

## 2024-04-24 DIAGNOSIS — N2581 Secondary hyperparathyroidism of renal origin: Secondary | ICD-10-CM | POA: Diagnosis not present

## 2024-04-24 DIAGNOSIS — Z992 Dependence on renal dialysis: Secondary | ICD-10-CM | POA: Diagnosis not present

## 2024-04-27 DIAGNOSIS — N2581 Secondary hyperparathyroidism of renal origin: Secondary | ICD-10-CM | POA: Diagnosis not present

## 2024-04-27 DIAGNOSIS — Z992 Dependence on renal dialysis: Secondary | ICD-10-CM | POA: Diagnosis not present

## 2024-04-27 DIAGNOSIS — N186 End stage renal disease: Secondary | ICD-10-CM | POA: Diagnosis not present

## 2024-04-29 DIAGNOSIS — N2581 Secondary hyperparathyroidism of renal origin: Secondary | ICD-10-CM | POA: Diagnosis not present

## 2024-04-29 DIAGNOSIS — N186 End stage renal disease: Secondary | ICD-10-CM | POA: Diagnosis not present

## 2024-04-29 DIAGNOSIS — Z992 Dependence on renal dialysis: Secondary | ICD-10-CM | POA: Diagnosis not present

## 2024-05-01 DIAGNOSIS — Z992 Dependence on renal dialysis: Secondary | ICD-10-CM | POA: Diagnosis not present

## 2024-05-01 DIAGNOSIS — N186 End stage renal disease: Secondary | ICD-10-CM | POA: Diagnosis not present

## 2024-05-01 DIAGNOSIS — N2581 Secondary hyperparathyroidism of renal origin: Secondary | ICD-10-CM | POA: Diagnosis not present

## 2024-05-04 DIAGNOSIS — Z992 Dependence on renal dialysis: Secondary | ICD-10-CM | POA: Diagnosis not present

## 2024-05-04 DIAGNOSIS — N2581 Secondary hyperparathyroidism of renal origin: Secondary | ICD-10-CM | POA: Diagnosis not present

## 2024-05-04 DIAGNOSIS — N186 End stage renal disease: Secondary | ICD-10-CM | POA: Diagnosis not present

## 2024-05-06 DIAGNOSIS — Z992 Dependence on renal dialysis: Secondary | ICD-10-CM | POA: Diagnosis not present

## 2024-05-06 DIAGNOSIS — N2581 Secondary hyperparathyroidism of renal origin: Secondary | ICD-10-CM | POA: Diagnosis not present

## 2024-05-06 DIAGNOSIS — N186 End stage renal disease: Secondary | ICD-10-CM | POA: Diagnosis not present

## 2024-05-07 DIAGNOSIS — E041 Nontoxic single thyroid nodule: Secondary | ICD-10-CM | POA: Diagnosis not present

## 2024-05-08 DIAGNOSIS — N2581 Secondary hyperparathyroidism of renal origin: Secondary | ICD-10-CM | POA: Diagnosis not present

## 2024-05-08 DIAGNOSIS — N186 End stage renal disease: Secondary | ICD-10-CM | POA: Diagnosis not present

## 2024-05-08 DIAGNOSIS — Z992 Dependence on renal dialysis: Secondary | ICD-10-CM | POA: Diagnosis not present

## 2024-05-11 DIAGNOSIS — N186 End stage renal disease: Secondary | ICD-10-CM | POA: Diagnosis not present

## 2024-05-11 DIAGNOSIS — N2581 Secondary hyperparathyroidism of renal origin: Secondary | ICD-10-CM | POA: Diagnosis not present

## 2024-05-11 DIAGNOSIS — Z992 Dependence on renal dialysis: Secondary | ICD-10-CM | POA: Diagnosis not present

## 2024-05-12 ENCOUNTER — Ambulatory Visit (HOSPITAL_COMMUNITY)
Admission: RE | Admit: 2024-05-12 | Discharge: 2024-05-12 | Disposition: A | Discharge status: Home / Self Care | Source: Ambulatory Visit | Attending: Podiatry | Admitting: Podiatry

## 2024-05-12 DIAGNOSIS — L03032 Cellulitis of left toe: Secondary | ICD-10-CM

## 2024-05-12 DIAGNOSIS — I999 Unspecified disorder of circulatory system: Secondary | ICD-10-CM | POA: Diagnosis not present

## 2024-05-12 LAB — VAS US ABI WITH/WO TBI

## 2024-05-13 DIAGNOSIS — Z992 Dependence on renal dialysis: Secondary | ICD-10-CM | POA: Diagnosis not present

## 2024-05-13 DIAGNOSIS — N2581 Secondary hyperparathyroidism of renal origin: Secondary | ICD-10-CM | POA: Diagnosis not present

## 2024-05-13 DIAGNOSIS — N186 End stage renal disease: Secondary | ICD-10-CM | POA: Diagnosis not present

## 2024-05-15 DIAGNOSIS — N2581 Secondary hyperparathyroidism of renal origin: Secondary | ICD-10-CM | POA: Diagnosis not present

## 2024-05-15 DIAGNOSIS — I129 Hypertensive chronic kidney disease with stage 1 through stage 4 chronic kidney disease, or unspecified chronic kidney disease: Secondary | ICD-10-CM | POA: Diagnosis not present

## 2024-05-15 DIAGNOSIS — N186 End stage renal disease: Secondary | ICD-10-CM | POA: Diagnosis not present

## 2024-05-15 DIAGNOSIS — Z992 Dependence on renal dialysis: Secondary | ICD-10-CM | POA: Diagnosis not present

## 2024-05-18 DIAGNOSIS — N2581 Secondary hyperparathyroidism of renal origin: Secondary | ICD-10-CM | POA: Diagnosis not present

## 2024-05-18 DIAGNOSIS — Z992 Dependence on renal dialysis: Secondary | ICD-10-CM | POA: Diagnosis not present

## 2024-05-18 DIAGNOSIS — N186 End stage renal disease: Secondary | ICD-10-CM | POA: Diagnosis not present

## 2024-05-19 ENCOUNTER — Ambulatory Visit (INDEPENDENT_AMBULATORY_CARE_PROVIDER_SITE_OTHER): Admitting: Podiatry

## 2024-05-19 DIAGNOSIS — L03032 Cellulitis of left toe: Secondary | ICD-10-CM

## 2024-05-19 DIAGNOSIS — I739 Peripheral vascular disease, unspecified: Secondary | ICD-10-CM

## 2024-05-19 DIAGNOSIS — M79674 Pain in right toe(s): Secondary | ICD-10-CM

## 2024-05-19 DIAGNOSIS — M79675 Pain in left toe(s): Secondary | ICD-10-CM | POA: Diagnosis not present

## 2024-05-19 DIAGNOSIS — B351 Tinea unguium: Secondary | ICD-10-CM

## 2024-05-19 NOTE — Progress Notes (Signed)
  Subjective:  Patient ID: Timothy Ponce, male    DOB: March 07, 1956,   MRN: [id number]  Chief Complaint  Patient presents with   Foot Pain    RM#22 Presents for follow-up of paronychia of left great toe. Nail culture results.    68 y.o. male presents for follow-up of paronychia of left great toe. Relates doing a bit better. Has taken antibiotics and been dressing toe. Has had his ABIs but relates he does not want to have a procedure today. . Denies any other pedal complaints. Denies n/v/f/c.   Past Medical History:  Diagnosis Date   Cataract    Complication of anesthesia    "Awakens during surgery"   Dyspnea    ESRD (end stage renal disease) (HCC)    [facility], [address]    Glaucoma    Neuropathy    hands - per patient    Objective:  Physical Exam: Vascular: DP/PT pulses 1/4 bilateral. CFT <3 seconds. Absent hair growth on digits. Edema noted to bilateral lower extremities. Xerosis noted bilaterally.  Skin. No lacerations or abrasions bilateral feet. Nails 1-5 bilateral  are thickened discolored and elongated with subungual debris. Left hallux nail with distal lateral incurvation and upon minimal debridment noted wound and purulence expressed from area. Some surroudning duskiness and darkening of the toe Musculoskeletal: MMT 5/5 bilateral lower extremities in DF, PF, Inversion and Eversion. Deceased ROM in DF of ankle joint.  Neurological: Sensation intact to light touch. Protective sensation diminished bilateral.    Summary: Right: Resting right ankle-brachial index indicates noncompressible right lower extremity arteries. The right toe-brachial index is normal.   Left: Resting left ankle-brachial index indicates noncompressible left lower extremity arteries. The left toe-brachial index is normal.  Assessment:   1. Pain due to onychomycosis of toenails of both feet   2. Peripheral arterial disease (HCC)   3. Paronychia of great toe of left foot        Plan:  Patient  was evaluated and treated and all questions answered. Discussed ingrown toenails etiology and treatment options including procedure for removal vs conservative care.  -Debridement minimal to nail in slant back fashion -Dressed with betadine, DSD. -Post-op shoe dispensed to offload toe.  -ABIS at toes are normal so will proceed with removal of ingrown nail.   Discussed ingrown toenails etiology and treatment options including procedure for removal vs conservative care.  Patient requesting to wait on removal of ingrown today and would just like nail trim. Advised if any worsening to return to have removed.  -Mechanically debrided all nails 1-5 bilateral using sterile nail nipper and filed with dremel without incident  -Answered all patient questions -Patient to return  in 3 months for at risk foot care -Patient advised to call the office if any problems or questions arise in the meantime.    Return in about 3 months (around 08/19/2024) for rfc.   Jennefer Moats, DPM

## 2024-05-20 DIAGNOSIS — N2581 Secondary hyperparathyroidism of renal origin: Secondary | ICD-10-CM | POA: Diagnosis not present

## 2024-05-20 DIAGNOSIS — N186 End stage renal disease: Secondary | ICD-10-CM | POA: Diagnosis not present

## 2024-05-20 DIAGNOSIS — Z992 Dependence on renal dialysis: Secondary | ICD-10-CM | POA: Diagnosis not present

## 2024-05-22 DIAGNOSIS — N186 End stage renal disease: Secondary | ICD-10-CM | POA: Diagnosis not present

## 2024-05-22 DIAGNOSIS — Z992 Dependence on renal dialysis: Secondary | ICD-10-CM | POA: Diagnosis not present

## 2024-05-22 DIAGNOSIS — N2581 Secondary hyperparathyroidism of renal origin: Secondary | ICD-10-CM | POA: Diagnosis not present

## 2024-05-25 DIAGNOSIS — Z992 Dependence on renal dialysis: Secondary | ICD-10-CM | POA: Diagnosis not present

## 2024-05-25 DIAGNOSIS — N186 End stage renal disease: Secondary | ICD-10-CM | POA: Diagnosis not present

## 2024-05-25 DIAGNOSIS — N2581 Secondary hyperparathyroidism of renal origin: Secondary | ICD-10-CM | POA: Diagnosis not present

## 2024-05-26 DIAGNOSIS — N186 End stage renal disease: Secondary | ICD-10-CM | POA: Diagnosis not present

## 2024-05-26 DIAGNOSIS — Z0001 Encounter for general adult medical examination with abnormal findings: Secondary | ICD-10-CM | POA: Diagnosis not present

## 2024-05-26 DIAGNOSIS — D631 Anemia in chronic kidney disease: Secondary | ICD-10-CM | POA: Diagnosis not present

## 2024-05-26 DIAGNOSIS — G63 Polyneuropathy in diseases classified elsewhere: Secondary | ICD-10-CM | POA: Diagnosis not present

## 2024-05-27 DIAGNOSIS — Z992 Dependence on renal dialysis: Secondary | ICD-10-CM | POA: Diagnosis not present

## 2024-05-27 DIAGNOSIS — N2581 Secondary hyperparathyroidism of renal origin: Secondary | ICD-10-CM | POA: Diagnosis not present

## 2024-05-27 DIAGNOSIS — N186 End stage renal disease: Secondary | ICD-10-CM | POA: Diagnosis not present

## 2024-05-29 DIAGNOSIS — N2581 Secondary hyperparathyroidism of renal origin: Secondary | ICD-10-CM | POA: Diagnosis not present

## 2024-05-29 DIAGNOSIS — N186 End stage renal disease: Secondary | ICD-10-CM | POA: Diagnosis not present

## 2024-05-29 DIAGNOSIS — Z992 Dependence on renal dialysis: Secondary | ICD-10-CM | POA: Diagnosis not present

## 2024-06-01 DIAGNOSIS — Z992 Dependence on renal dialysis: Secondary | ICD-10-CM | POA: Diagnosis not present

## 2024-06-01 DIAGNOSIS — N186 End stage renal disease: Secondary | ICD-10-CM | POA: Diagnosis not present

## 2024-06-01 DIAGNOSIS — N2581 Secondary hyperparathyroidism of renal origin: Secondary | ICD-10-CM | POA: Diagnosis not present

## 2024-06-03 DIAGNOSIS — Z992 Dependence on renal dialysis: Secondary | ICD-10-CM | POA: Diagnosis not present

## 2024-06-03 DIAGNOSIS — N2581 Secondary hyperparathyroidism of renal origin: Secondary | ICD-10-CM | POA: Diagnosis not present

## 2024-06-03 DIAGNOSIS — N186 End stage renal disease: Secondary | ICD-10-CM | POA: Diagnosis not present

## 2024-06-05 DIAGNOSIS — N186 End stage renal disease: Secondary | ICD-10-CM | POA: Diagnosis not present

## 2024-06-05 DIAGNOSIS — Z992 Dependence on renal dialysis: Secondary | ICD-10-CM | POA: Diagnosis not present

## 2024-06-05 DIAGNOSIS — N2581 Secondary hyperparathyroidism of renal origin: Secondary | ICD-10-CM | POA: Diagnosis not present

## 2024-06-08 DIAGNOSIS — Z992 Dependence on renal dialysis: Secondary | ICD-10-CM | POA: Diagnosis not present

## 2024-06-08 DIAGNOSIS — N2581 Secondary hyperparathyroidism of renal origin: Secondary | ICD-10-CM | POA: Diagnosis not present

## 2024-06-08 DIAGNOSIS — N186 End stage renal disease: Secondary | ICD-10-CM | POA: Diagnosis not present

## 2024-06-10 DIAGNOSIS — N186 End stage renal disease: Secondary | ICD-10-CM | POA: Diagnosis not present

## 2024-06-10 DIAGNOSIS — Z992 Dependence on renal dialysis: Secondary | ICD-10-CM | POA: Diagnosis not present

## 2024-06-10 DIAGNOSIS — N2581 Secondary hyperparathyroidism of renal origin: Secondary | ICD-10-CM | POA: Diagnosis not present

## 2024-06-12 DIAGNOSIS — Z992 Dependence on renal dialysis: Secondary | ICD-10-CM | POA: Diagnosis not present

## 2024-06-12 DIAGNOSIS — N186 End stage renal disease: Secondary | ICD-10-CM | POA: Diagnosis not present

## 2024-06-12 DIAGNOSIS — N2581 Secondary hyperparathyroidism of renal origin: Secondary | ICD-10-CM | POA: Diagnosis not present

## 2024-06-14 DIAGNOSIS — N186 End stage renal disease: Secondary | ICD-10-CM | POA: Diagnosis not present

## 2024-06-14 DIAGNOSIS — I129 Hypertensive chronic kidney disease with stage 1 through stage 4 chronic kidney disease, or unspecified chronic kidney disease: Secondary | ICD-10-CM | POA: Diagnosis not present

## 2024-06-14 DIAGNOSIS — Z992 Dependence on renal dialysis: Secondary | ICD-10-CM | POA: Diagnosis not present

## 2024-06-15 DIAGNOSIS — Z992 Dependence on renal dialysis: Secondary | ICD-10-CM | POA: Diagnosis not present

## 2024-06-15 DIAGNOSIS — N2581 Secondary hyperparathyroidism of renal origin: Secondary | ICD-10-CM | POA: Diagnosis not present

## 2024-06-15 DIAGNOSIS — N186 End stage renal disease: Secondary | ICD-10-CM | POA: Diagnosis not present

## 2024-06-16 DIAGNOSIS — H401133 Primary open-angle glaucoma, bilateral, severe stage: Secondary | ICD-10-CM | POA: Diagnosis not present

## 2024-06-16 DIAGNOSIS — Z961 Presence of intraocular lens: Secondary | ICD-10-CM | POA: Diagnosis not present

## 2024-06-17 DIAGNOSIS — N2581 Secondary hyperparathyroidism of renal origin: Secondary | ICD-10-CM | POA: Diagnosis not present

## 2024-06-17 DIAGNOSIS — Z992 Dependence on renal dialysis: Secondary | ICD-10-CM | POA: Diagnosis not present

## 2024-06-17 DIAGNOSIS — N186 End stage renal disease: Secondary | ICD-10-CM | POA: Diagnosis not present

## 2024-06-19 DIAGNOSIS — N2581 Secondary hyperparathyroidism of renal origin: Secondary | ICD-10-CM | POA: Diagnosis not present

## 2024-06-19 DIAGNOSIS — N186 End stage renal disease: Secondary | ICD-10-CM | POA: Diagnosis not present

## 2024-06-19 DIAGNOSIS — Z992 Dependence on renal dialysis: Secondary | ICD-10-CM | POA: Diagnosis not present

## 2024-06-22 DIAGNOSIS — N186 End stage renal disease: Secondary | ICD-10-CM | POA: Diagnosis not present

## 2024-06-22 DIAGNOSIS — Z992 Dependence on renal dialysis: Secondary | ICD-10-CM | POA: Diagnosis not present

## 2024-06-22 DIAGNOSIS — N2581 Secondary hyperparathyroidism of renal origin: Secondary | ICD-10-CM | POA: Diagnosis not present

## 2024-06-23 ENCOUNTER — Telehealth: Payer: Self-pay | Admitting: Podiatry

## 2024-06-23 NOTE — Telephone Encounter (Signed)
 The entire left first toe is aching He is taking 650 mg tylenol  2xdaily , 1.5 in morning and 1 at night for pain. If foot is grazed at all it hurts. This is not the same as the toe nail, its the entire toe hurting.He wants to rule out Osteomyelitis. What should be his next step?

## 2024-06-24 DIAGNOSIS — N186 End stage renal disease: Secondary | ICD-10-CM | POA: Diagnosis not present

## 2024-06-24 DIAGNOSIS — Z992 Dependence on renal dialysis: Secondary | ICD-10-CM | POA: Diagnosis not present

## 2024-06-24 DIAGNOSIS — N2581 Secondary hyperparathyroidism of renal origin: Secondary | ICD-10-CM | POA: Diagnosis not present

## 2024-06-24 NOTE — Telephone Encounter (Signed)
 Called patient to schedule an appointment.left voicemail

## 2024-06-26 DIAGNOSIS — N2581 Secondary hyperparathyroidism of renal origin: Secondary | ICD-10-CM | POA: Diagnosis not present

## 2024-06-26 DIAGNOSIS — N186 End stage renal disease: Secondary | ICD-10-CM | POA: Diagnosis not present

## 2024-06-26 DIAGNOSIS — Z992 Dependence on renal dialysis: Secondary | ICD-10-CM | POA: Diagnosis not present

## 2024-06-29 ENCOUNTER — Telehealth: Payer: Self-pay | Admitting: Podiatry

## 2024-06-29 DIAGNOSIS — Z992 Dependence on renal dialysis: Secondary | ICD-10-CM | POA: Diagnosis not present

## 2024-06-29 DIAGNOSIS — N186 End stage renal disease: Secondary | ICD-10-CM | POA: Diagnosis not present

## 2024-06-29 DIAGNOSIS — N2581 Secondary hyperparathyroidism of renal origin: Secondary | ICD-10-CM | POA: Diagnosis not present

## 2024-06-29 NOTE — Telephone Encounter (Signed)
 Patient is scheduled for tomorrow 7/16.

## 2024-06-30 ENCOUNTER — Ambulatory Visit (INDEPENDENT_AMBULATORY_CARE_PROVIDER_SITE_OTHER)

## 2024-06-30 ENCOUNTER — Encounter: Payer: Self-pay | Admitting: Podiatry

## 2024-06-30 ENCOUNTER — Ambulatory Visit (INDEPENDENT_AMBULATORY_CARE_PROVIDER_SITE_OTHER): Admitting: Podiatry

## 2024-06-30 DIAGNOSIS — M869 Osteomyelitis, unspecified: Secondary | ICD-10-CM

## 2024-06-30 DIAGNOSIS — M86172 Other acute osteomyelitis, left ankle and foot: Secondary | ICD-10-CM

## 2024-06-30 DIAGNOSIS — L080 Pyoderma: Secondary | ICD-10-CM | POA: Diagnosis not present

## 2024-06-30 MED ORDER — DOXYCYCLINE HYCLATE 100 MG PO TABS: 100.0000 mg | ORAL_TABLET | Freq: Two times a day (BID) | ORAL | 0 refills | Status: DC

## 2024-06-30 MED ORDER — METHYLPREDNISOLONE 4 MG PO TBPK: ORAL_TABLET | ORAL | 0 refills | Status: DC

## 2024-06-30 NOTE — Progress Notes (Signed)
 Subjective:  Patient ID: Timothy Ponce, male    DOB: Nov 23, 1956,   MRN: [id number]  Chief Complaint  Patient presents with   Toe Pain    Follow up paronychia hallux left - the whole toe starting to hurt now, skin discolored, draining from lateral border, taking Tylenol  daily, concerned for osteomyelitis     68 y.o. male presents for follow-up of left great toe pain. Relates it has worsened. The whole toe is in pain and draining. He has been taking tylenol . He is concern he has osteomyelitis.  Denies any other pedal complaints. Denies n/v/f/c.   Past Medical History:  Diagnosis Date   Cataract    Complication of anesthesia    Awakens during surgery   Dyspnea    ESRD (end stage renal disease) (HCC)    [facility], [address]    Glaucoma    Neuropathy    hands - per patient    Objective:  Physical Exam: Vascular: DP/PT pulses 1/4 bilateral. CFT <3 seconds. Absent hair growth on digits. Edema noted to bilateral lower extremities. Xerosis noted bilaterally.  Skin. No lacerations or abrasions bilateral feet. Nails 1-5 bilateral  are thickened discolored and elongated with subungual debris. Left hallux nail with distal lateral incurvation and upon minimal debridment noted wound and purulence expressed from area. Purulence thick and almost has a tophic appearance at times. Worsening duskiness and darkening around the toe extending to MPJ.  Musculoskeletal: MMT 5/5 bilateral lower extremities in DF, PF, Inversion and Eversion. Deceased ROM in DF of ankle joint.  Neurological: Sensation intact to light touch. Protective sensation diminished bilateral.    Summary: Right: Resting right ankle-brachial index indicates noncompressible right lower extremity arteries. The right toe-brachial index is normal.   Left: Resting left ankle-brachial index indicates noncompressible left lower extremity arteries. The left toe-brachial index is normal.  Assessment:   1. Osteomyelitis of great toe of  left foot (HCC)        Plan:  Patient was evaluated and treated and all questions answered. Ulcer left great toe with likely osteomyelitis  -X-rays reviewed and significant erosions noted to the distal left hallux concerning for osteomyelitis of the digit.  -Debridement as below. -Dressed with betadine, DSD. -Wound culture obtained.  -Off-loading with surgical shoe. -Doxycycline  sent to pharmacy  -Medrol  dose pack prescribed for possible gout flare coinciding.  -Discussed glucose control and proper protein-rich diet.  -Discussed concern for infection in the toe on the left. Discussed the best option in this case in amputation of the toe. Discussed given worsening darkening around toe and questionable vascular status would like to have evaluated by vascular first for any intervention needed prior to amputation. He is stable at the moment and does not need amputation emergently.  MRI ordered to further evaluate.  -Discussed if any worsening redness, pain, fever or chills to call or may need to report to the emergency room. Patient expressed understanding.   Procedure: Excisional Debridement of Wound Rationale: Removal of non-viable soft tissue from the wound to promote healing.  Anesthesia: none Pre-Debridement Wound Measurements: Overlying callus and slough  Post-Debridement Wound Measurements: 0.5 cm x 0.5 cm x 0.5 cm  Type of Debridement: Sharp Excisional Tissue Removed: Non-viable soft tissue Depth of Debridement: subcutaneous tissue. Technique: Sharp excisional debridement to bleeding, viable wound base.  Dressing: Dry, sterile, compression dressing. Disposition: Patient tolerated procedure well. Patient to return in 1 week for follow-up.  No follow-ups on file.      No follow-ups on file.  Asberry Failing, DPM

## 2024-06-30 NOTE — Addendum Note (Signed)
 Addended by: Jalena Vanderlinden R on: 06/30/2024 02:50 PM   Modules accepted: Orders

## 2024-06-30 NOTE — Addendum Note (Signed)
 Addended by: ELAYNE KNEE E on: 06/30/2024 03:09 PM   Modules accepted: Orders

## 2024-07-01 ENCOUNTER — Encounter: Payer: Self-pay | Admitting: Podiatry

## 2024-07-01 DIAGNOSIS — N2581 Secondary hyperparathyroidism of renal origin: Secondary | ICD-10-CM | POA: Diagnosis not present

## 2024-07-01 DIAGNOSIS — Z992 Dependence on renal dialysis: Secondary | ICD-10-CM | POA: Diagnosis not present

## 2024-07-01 DIAGNOSIS — N186 End stage renal disease: Secondary | ICD-10-CM | POA: Diagnosis not present

## 2024-07-02 ENCOUNTER — Ambulatory Visit
Admission: RE | Admit: 2024-07-02 | Discharge: 2024-07-02 | Disposition: A | Discharge status: Home / Self Care | Source: Ambulatory Visit | Attending: Podiatry | Admitting: Podiatry

## 2024-07-02 DIAGNOSIS — M869 Osteomyelitis, unspecified: Secondary | ICD-10-CM

## 2024-07-02 DIAGNOSIS — M86172 Other acute osteomyelitis, left ankle and foot: Secondary | ICD-10-CM | POA: Diagnosis not present

## 2024-07-02 DIAGNOSIS — L03032 Cellulitis of left toe: Secondary | ICD-10-CM | POA: Diagnosis not present

## 2024-07-03 DIAGNOSIS — N186 End stage renal disease: Secondary | ICD-10-CM | POA: Diagnosis not present

## 2024-07-03 DIAGNOSIS — N2581 Secondary hyperparathyroidism of renal origin: Secondary | ICD-10-CM | POA: Diagnosis not present

## 2024-07-03 DIAGNOSIS — Z992 Dependence on renal dialysis: Secondary | ICD-10-CM | POA: Diagnosis not present

## 2024-07-05 ENCOUNTER — Ambulatory Visit (INDEPENDENT_AMBULATORY_CARE_PROVIDER_SITE_OTHER): Admitting: Podiatry

## 2024-07-05 ENCOUNTER — Encounter: Payer: Self-pay | Admitting: Podiatry

## 2024-07-05 VITALS — BP 102/58 | HR 63

## 2024-07-05 DIAGNOSIS — M86172 Other acute osteomyelitis, left ankle and foot: Secondary | ICD-10-CM

## 2024-07-05 DIAGNOSIS — M869 Osteomyelitis, unspecified: Secondary | ICD-10-CM | POA: Diagnosis not present

## 2024-07-05 DIAGNOSIS — I739 Peripheral vascular disease, unspecified: Secondary | ICD-10-CM

## 2024-07-05 MED ORDER — DOXYCYCLINE HYCLATE 100 MG PO TABS: 100.0000 mg | ORAL_TABLET | Freq: Two times a day (BID) | ORAL | 0 refills | Status: DC

## 2024-07-05 NOTE — Progress Notes (Signed)
  Subjective:  Patient ID: Timothy Ponce, male    DOB: [birth year],   MRN: [id number]  Chief Complaint  Patient presents with   Diabetic Ulcer    It's the same as it was last time I was here.    68 y.o. male presents for follow-up of left great toe pain. Relates it is doing about the same. Has had MRI awaiting vascular appointment.   Denies any other pedal complaints. Denies n/v/f/c.   Past Medical History:  Diagnosis Date   Cataract    Complication of anesthesia    Awakens during surgery   Dyspnea    ESRD (end stage renal disease) (HCC)    [facility], [address]    Glaucoma    Neuropathy    hands - per patient    Objective:  Physical Exam: Vascular: DP/PT pulses 1/4 bilateral. CFT <3 seconds. Absent hair growth on digits. Edema noted to bilateral lower extremities. Xerosis noted bilaterally.  Skin. No lacerations or abrasions bilateral feet. Nails 1-5 bilateral  are thickened discolored and elongated with subungual debris. Left hallux nail with distal lateral incurvation and upon minimal debridment noted wound and purulence expressed from area. Purulence thick and almost has a tophic appearance at times. Worsening duskiness and darkening around the toe extending to MPJ.  Musculoskeletal: MMT 5/5 bilateral lower extremities in DF, PF, Inversion and Eversion. Deceased ROM in DF of ankle joint.  Neurological: Sensation intact to light touch. Protective sensation diminished bilateral.    Summary: Right: Resting right ankle-brachial index indicates noncompressible right lower extremity arteries. The right toe-brachial index is normal.   Left: Resting left ankle-brachial index indicates noncompressible left lower extremity arteries. The left toe-brachial index is normal.  IMPRESSION: 1. Extensive osteomyelitis involving the distal phalanx of the great toe with significant destructive bony changes and pathologic fracture. 2. Findings suggest septic arthritis at the  interphalangeal joint and adjacent osteomyelitis involving the distal aspect of the proximal phalanx of the great toe. 3. Diffuse cellulitis and myofasciitis without discrete drainable soft tissue abscess or pyomyositis.     Assessment:   1. Osteomyelitis of great toe of left foot (HCC)   2. Peripheral arterial disease (HCC)         Plan:  Patient was evaluated and treated and all questions answered. Ulcer left great toe with likely osteomyelitis  -X-rays reviewed and significant erosions noted to the distal left hallux concerning for osteomyelitis of the digit.  -Dressed with betadine, DSD. -Wound culture pending.  -Off-loading with surgical shoe. -Doxycycline  sent to pharmacy. Continue refill sent -Discussed glucose control and proper protein-rich diet.  -Discussed concern for infection in the toe on the left. Discussed the best option in this case in amputation of the toe. Discussed given worsening darkening around toe and questionable vascular status would like to have evaluated by vascular first for any intervention needed prior to amputation. He is stable at the moment and does not need amputation emergently.  Vascular appointment 8/13.  MRI reviewed and discussed with patient confirming osteomyelitis.  -Discussed if any worsening redness, pain, fever or chills to call or may need to report to the emergency room. Patient expressed understanding.   Follow-up in 1 week for recheck.     No follow-ups on file.      No follow-ups on file.   Asberry Failing, DPM

## 2024-07-06 DIAGNOSIS — N186 End stage renal disease: Secondary | ICD-10-CM | POA: Diagnosis not present

## 2024-07-06 DIAGNOSIS — Z992 Dependence on renal dialysis: Secondary | ICD-10-CM | POA: Diagnosis not present

## 2024-07-06 DIAGNOSIS — N2581 Secondary hyperparathyroidism of renal origin: Secondary | ICD-10-CM | POA: Diagnosis not present

## 2024-07-08 DIAGNOSIS — N2581 Secondary hyperparathyroidism of renal origin: Secondary | ICD-10-CM | POA: Diagnosis not present

## 2024-07-08 DIAGNOSIS — Z992 Dependence on renal dialysis: Secondary | ICD-10-CM | POA: Diagnosis not present

## 2024-07-08 DIAGNOSIS — N186 End stage renal disease: Secondary | ICD-10-CM | POA: Diagnosis not present

## 2024-07-10 DIAGNOSIS — N186 End stage renal disease: Secondary | ICD-10-CM | POA: Diagnosis not present

## 2024-07-10 DIAGNOSIS — N2581 Secondary hyperparathyroidism of renal origin: Secondary | ICD-10-CM | POA: Diagnosis not present

## 2024-07-10 DIAGNOSIS — Z992 Dependence on renal dialysis: Secondary | ICD-10-CM | POA: Diagnosis not present

## 2024-07-13 DIAGNOSIS — N186 End stage renal disease: Secondary | ICD-10-CM | POA: Diagnosis not present

## 2024-07-13 DIAGNOSIS — N2581 Secondary hyperparathyroidism of renal origin: Secondary | ICD-10-CM | POA: Diagnosis not present

## 2024-07-13 DIAGNOSIS — Z992 Dependence on renal dialysis: Secondary | ICD-10-CM | POA: Diagnosis not present

## 2024-07-14 ENCOUNTER — Ambulatory Visit (INDEPENDENT_AMBULATORY_CARE_PROVIDER_SITE_OTHER): Admitting: Podiatry

## 2024-07-14 ENCOUNTER — Encounter: Payer: Self-pay | Admitting: Podiatry

## 2024-07-14 DIAGNOSIS — M86172 Other acute osteomyelitis, left ankle and foot: Secondary | ICD-10-CM

## 2024-07-14 DIAGNOSIS — M869 Osteomyelitis, unspecified: Secondary | ICD-10-CM | POA: Diagnosis not present

## 2024-07-14 NOTE — Progress Notes (Signed)
  Subjective:  Patient ID: Timothy Ponce, male    DOB: [birth year],   MRN: [id number]  Chief Complaint  Patient presents with   Diabetic Ulcer    I guess it's okay.    68 y.o. male presents for follow-up of left great toe pain. Relates it is doing about the same. Has had MRI awaiting vascular appointment.   Denies any other pedal complaints. Denies n/v/f/c.   Past Medical History:  Diagnosis Date   Cataract    Complication of anesthesia    Awakens during surgery   Dyspnea    ESRD (end stage renal disease) (HCC)    [facility], [address]    Glaucoma    Neuropathy    hands - per patient    Objective:  Physical Exam: Vascular: DP/PT pulses 1/4 bilateral. CFT <3 seconds. Absent hair growth on digits. Edema noted to bilateral lower extremities. Xerosis noted bilaterally.  Skin. No lacerations or abrasions bilateral feet. Nails 1-5 bilateral  are thickened discolored and elongated with subungual debris. Left hallux nail with distal lateral incurvation and upon minimal debridment noted wound and purulence expressed from area. Purulence thick and almost has a tophic appearance at times. Worsening duskiness and darkening around the toe extending to MPJ.  Musculoskeletal: MMT 5/5 bilateral lower extremities in DF, PF, Inversion and Eversion. Deceased ROM in DF of ankle joint.  Neurological: Sensation intact to light touch. Protective sensation diminished bilateral.    Summary: Right: Resting right ankle-brachial index indicates noncompressible right lower extremity arteries. The right toe-brachial index is normal.   Left: Resting left ankle-brachial index indicates noncompressible left lower extremity arteries. The left toe-brachial index is normal.  IMPRESSION: 1. Extensive osteomyelitis involving the distal phalanx of the great toe with significant destructive bony changes and pathologic fracture. 2. Findings suggest septic arthritis at the interphalangeal joint and adjacent  osteomyelitis involving the distal aspect of the proximal phalanx of the great toe. 3. Diffuse cellulitis and myofasciitis without discrete drainable soft tissue abscess or pyomyositis.     Assessment:   1. Osteomyelitis of great toe of left foot (HCC)          Plan:  Patient was evaluated and treated and all questions answered. Ulcer left great toe with likely osteomyelitis  -X-rays reviewed and significant erosions noted to the distal left hallux concerning for osteomyelitis of the digit.  -Dressed with betadine, DSD. -Wound culture pending.  -Off-loading with surgical shoe. -Doxycycline  sent to pharmacy. Continue refill sent -Discussed glucose control and proper protein-rich diet.  -Discussed concern for infection in the toe on the left. Discussed the best option in this case in amputation of the toe. Discussed given worsening darkening around toe and questionable vascular status would like to have evaluated by vascular first for any intervention needed prior to amputation. He is stable at the moment and does not need amputation emergently.  Vascular appointment 8/13.  MRI reviewed and discussed with patient confirming osteomyelitis.  -Discussed if any worsening redness, pain, fever or chills to call or may need to report to the emergency room. Patient expressed understanding.   Follow-up in 1 week for recheck.     Return in about 1 week (around 07/21/2024) for wound check.      Return in about 1 week (around 07/21/2024) for wound check.   Asberry Failing, DPM

## 2024-07-15 DIAGNOSIS — N186 End stage renal disease: Secondary | ICD-10-CM | POA: Diagnosis not present

## 2024-07-15 DIAGNOSIS — Z992 Dependence on renal dialysis: Secondary | ICD-10-CM | POA: Diagnosis not present

## 2024-07-15 DIAGNOSIS — N2581 Secondary hyperparathyroidism of renal origin: Secondary | ICD-10-CM | POA: Diagnosis not present

## 2024-07-15 DIAGNOSIS — I129 Hypertensive chronic kidney disease with stage 1 through stage 4 chronic kidney disease, or unspecified chronic kidney disease: Secondary | ICD-10-CM | POA: Diagnosis not present

## 2024-07-17 DIAGNOSIS — Z992 Dependence on renal dialysis: Secondary | ICD-10-CM | POA: Diagnosis not present

## 2024-07-17 DIAGNOSIS — N2581 Secondary hyperparathyroidism of renal origin: Secondary | ICD-10-CM | POA: Diagnosis not present

## 2024-07-17 DIAGNOSIS — N186 End stage renal disease: Secondary | ICD-10-CM | POA: Diagnosis not present

## 2024-07-19 ENCOUNTER — Other Ambulatory Visit: Payer: Self-pay | Admitting: Podiatry

## 2024-07-20 DIAGNOSIS — N2581 Secondary hyperparathyroidism of renal origin: Secondary | ICD-10-CM | POA: Diagnosis not present

## 2024-07-20 DIAGNOSIS — Z992 Dependence on renal dialysis: Secondary | ICD-10-CM | POA: Diagnosis not present

## 2024-07-20 DIAGNOSIS — N186 End stage renal disease: Secondary | ICD-10-CM | POA: Diagnosis not present

## 2024-07-21 ENCOUNTER — Ambulatory Visit (INDEPENDENT_AMBULATORY_CARE_PROVIDER_SITE_OTHER): Admitting: Podiatry

## 2024-07-21 ENCOUNTER — Encounter: Payer: Self-pay | Admitting: Podiatry

## 2024-07-21 ENCOUNTER — Ambulatory Visit: Admitting: Podiatry

## 2024-07-21 VITALS — BP 127/70 | HR 60 | Temp 95.5°F

## 2024-07-21 DIAGNOSIS — M869 Osteomyelitis, unspecified: Secondary | ICD-10-CM

## 2024-07-21 MED ORDER — SULFAMETHOXAZOLE-TRIMETHOPRIM 800-160 MG PO TABS: 1.0000 | ORAL_TABLET | Freq: Two times a day (BID) | ORAL | 0 refills | Status: AC

## 2024-07-21 NOTE — Progress Notes (Signed)
 Subjective:  Patient ID: Timothy Ponce, male    DOB: 02-09-1956,   MRN: [id number]  Chief Complaint  Patient presents with   Wound Check    It's about the same as last week.  I think I have an ingrown toenail on my middle toe on that foot.  I stopped taking the antibiotic because it made me vomit on Sunday and I was nauseous on Monday while on dialysis.  I lost my appetite.    68 y.o. male presents for follow-up of left great toe pain. Relates it is doing about the same. Has had MRI awaiting vascular appointment. Relates antibiotics have been upsetting his stomach and stopped taking them.   Denies any other pedal complaints. Denies n/v/f/c.   Past Medical History:  Diagnosis Date   Cataract    Complication of anesthesia    Awakens during surgery   Dyspnea    ESRD (end stage renal disease) (HCC)    [facility], [address]    Glaucoma    Neuropathy    hands - per patient    Objective:  Physical Exam: Vascular: DP/PT pulses 1/4 bilateral. CFT <3 seconds. Absent hair growth on digits. Edema noted to bilateral lower extremities. Xerosis noted bilaterally.  Skin. No lacerations or abrasions bilateral feet. Nails 1-5 bilateral  are thickened discolored and elongated with subungual debris. Left hallux nail with distal lateral incurvation and upon minimal debridment noted wound and purulence expressed from area. Purulence thick and almost has a tophic appearance at times. Worsening duskiness and darkening around the toe extending to MPJ.  Musculoskeletal: MMT 5/5 bilateral lower extremities in DF, PF, Inversion and Eversion. Deceased ROM in DF of ankle joint.  Neurological: Sensation intact to light touch. Protective sensation diminished bilateral.    Summary: Right: Resting right ankle-brachial index indicates noncompressible right lower extremity arteries. The right toe-brachial index is normal.   Left: Resting left ankle-brachial index indicates noncompressible left lower  extremity arteries. The left toe-brachial index is normal.  IMPRESSION: 1. Extensive osteomyelitis involving the distal phalanx of the great toe with significant destructive bony changes and pathologic fracture. 2. Findings suggest septic arthritis at the interphalangeal joint and adjacent osteomyelitis involving the distal aspect of the proximal phalanx of the great toe. 3. Diffuse cellulitis and myofasciitis without discrete drainable soft tissue abscess or pyomyositis.     Assessment:   No diagnosis found.        Plan:  Patient was evaluated and treated and all questions answered. Ulcer left great toe with likely osteomyelitis  -X-rays reviewed and significant erosions noted to the distal left hallux concerning for osteomyelitis of the digit.  -Dressed with betadine, DSD. -Wound culture pending.  -Off-loading with surgical shoe. -Bactrim  sent to pharmacy.  -Discussed glucose control and proper protein-rich diet.  -Discussed concern for infection in the toe on the left. Discussed the best option in this case in amputation of the toe. Discussed given worsening darkening around toe and questionable vascular status would like to have evaluated by vascular first for any intervention needed prior to amputation. He is stable at the moment and does not need amputation emergently.  Vascular appointment 8/13.  MRI reviewed and discussed with patient confirming osteomyelitis.  -Discussed if any worsening redness, pain, fever or chills to call or may need to report to the emergency room. Patient expressed understanding.   Follow-up in 1 week for recheck.     No follow-ups on file.      No follow-ups on file.  Asberry Failing, DPM

## 2024-07-22 DIAGNOSIS — N186 End stage renal disease: Secondary | ICD-10-CM | POA: Diagnosis not present

## 2024-07-22 DIAGNOSIS — N2581 Secondary hyperparathyroidism of renal origin: Secondary | ICD-10-CM | POA: Diagnosis not present

## 2024-07-22 DIAGNOSIS — Z992 Dependence on renal dialysis: Secondary | ICD-10-CM | POA: Diagnosis not present

## 2024-07-24 DIAGNOSIS — Z992 Dependence on renal dialysis: Secondary | ICD-10-CM | POA: Diagnosis not present

## 2024-07-24 DIAGNOSIS — N2581 Secondary hyperparathyroidism of renal origin: Secondary | ICD-10-CM | POA: Diagnosis not present

## 2024-07-24 DIAGNOSIS — N186 End stage renal disease: Secondary | ICD-10-CM | POA: Diagnosis not present

## 2024-07-27 DIAGNOSIS — Z992 Dependence on renal dialysis: Secondary | ICD-10-CM | POA: Diagnosis not present

## 2024-07-27 DIAGNOSIS — N186 End stage renal disease: Secondary | ICD-10-CM | POA: Diagnosis not present

## 2024-07-27 DIAGNOSIS — N2581 Secondary hyperparathyroidism of renal origin: Secondary | ICD-10-CM | POA: Diagnosis not present

## 2024-07-28 ENCOUNTER — Ambulatory Visit: Attending: Vascular Surgery | Admitting: Vascular Surgery

## 2024-07-28 ENCOUNTER — Encounter: Payer: Self-pay | Admitting: Vascular Surgery

## 2024-07-28 ENCOUNTER — Ambulatory Visit: Admitting: Podiatry

## 2024-07-28 ENCOUNTER — Encounter: Payer: Self-pay | Admitting: Podiatry

## 2024-07-28 VITALS — BP 112/66 | HR 61 | Temp 98.4°F | Resp 18 | Ht 68.0 in | Wt 127.0 lb

## 2024-07-28 VITALS — BP 127/69 | HR 61 | Temp 96.6°F

## 2024-07-28 DIAGNOSIS — I739 Peripheral vascular disease, unspecified: Secondary | ICD-10-CM

## 2024-07-28 DIAGNOSIS — M869 Osteomyelitis, unspecified: Secondary | ICD-10-CM | POA: Diagnosis not present

## 2024-07-28 DIAGNOSIS — I70262 Atherosclerosis of native arteries of extremities with gangrene, left leg: Secondary | ICD-10-CM

## 2024-07-28 DIAGNOSIS — N186 End stage renal disease: Secondary | ICD-10-CM

## 2024-07-28 NOTE — Progress Notes (Signed)
 Subjective:  Patient ID: Timothy Ponce, male    DOB: 03/05/1956,   MRN: [id number]  Chief Complaint  Patient presents with   Diabetes    It feels like it's doing better.    68 y.o. male presents for follow-up of left great toe pain. Relates it is doing about the same. Has seen vascular and feel her should be able to heal amputation of left great toe.    Denies any other pedal complaints. Denies n/v/f/c.   Past Medical History:  Diagnosis Date   Cataract    Complication of anesthesia    Awakens during surgery   Dyspnea    ESRD (end stage renal disease) (HCC)    [facility], [address]    Glaucoma    Neuropathy    hands - per patient   Peripheral vascular disease (HCC)     Objective:  Physical Exam: Vascular: DP/PT pulses 1/4 bilateral. CFT <3 seconds. Absent hair growth on digits. Edema noted to bilateral lower extremities. Xerosis noted bilaterally.  Skin. No lacerations or abrasions bilateral feet. Nails 1-5 bilateral  are thickened discolored and elongated with subungual debris. Left hallux nail with distal lateral incurvation and upon minimal debridment noted wound and purulence expressed from area. Purulence thick and almost has a tophic appearance at times. Worsening duskiness and darkening around the toe extending to MPJ.  Musculoskeletal: MMT 5/5 bilateral lower extremities in DF, PF, Inversion and Eversion. Deceased ROM in DF of ankle joint.  Neurological: Sensation intact to light touch. Protective sensation diminished bilateral.    Summary: Right: Resting right ankle-brachial index indicates noncompressible right lower extremity arteries. The right toe-brachial index is normal.   Left: Resting left ankle-brachial index indicates noncompressible left lower extremity arteries. The left toe-brachial index is normal.  IMPRESSION: 1. Extensive osteomyelitis involving the distal phalanx of the great toe with significant destructive bony changes and  pathologic fracture. 2. Findings suggest septic arthritis at the interphalangeal joint and adjacent osteomyelitis involving the distal aspect of the proximal phalanx of the great toe. 3. Diffuse cellulitis and myofasciitis without discrete drainable soft tissue abscess or pyomyositis.     Assessment:   1. Osteomyelitis of great toe of left foot (HCC)   2. Peripheral arterial disease (HCC)           Plan:  Patient was evaluated and treated and all questions answered. Ulcer left great toe with likely osteomyelitis  -X-rays reviewed and significant erosions noted to the distal left hallux concerning for osteomyelitis of the digit.  -Dressed with betadine, DSD. -Wound culture pending.  -Off-loading with surgical shoe. -Bactrim  sent to pharmacy.  -Discussed glucose control and proper protein-rich diet.  -Discussed concern for infection in the toe on the left. Discussed the best option in this case in amputation of the toe. Has been cleared by vascular for amputation. Will now proceed. Discussed with patient perioperative course.  Discussed left hallux amputation.  -Informed surgical risk consent was reviewed and read aloud to the patient.  I reviewed the films.  I have discussed my findings with the patient in great detail.  I have discussed all risks including but not limited to infection, stiffness, scarring, limp, disability, deformity, damage to blood vessels and nerves, numbness, poor healing, need for braces, arthritis, chronic pain, amputation, death.  All benefits and realistic expectations discussed in great detail.  I have made no promises as to the outcome.  I have provided realistic expectations.  I have offered the patient a 2nd opinion, which  they have declined and assured me they preferred to proceed despite the risks. -Discussed if any worsening redness, pain, fever or chills to call or may need to report to the emergency room. Patient expressed understanding.  Plan for  surgery either 8/19 or 8/26.  Post-op meds: Oxycodone  5/325, zofran .      No follow-ups on file.      No follow-ups on file.   Asberry Failing, DPM

## 2024-07-28 NOTE — Progress Notes (Signed)
 Patient ID: Timothy Ponce, male   DOB: [birth year], 68 y.o.   MRN: [id number]  Reason for Consult: Follow-up   Referred by Catalina Bare, MD  Subjective:     HPI:  Timothy Ponce is a 68 y.o. male with history of end-stage renal disease on dialysis via right forearm AV fistula.  He does have a history of peripheral vascular disease but no previous interventions in [place].  Previously he was a Financial planner at [employer].  He now has dark discoloration of his left great toe that has been worsening over the past couple weeks.  He did undergo ABIs recently and is here today to discuss peripheral vascular options.  Past Medical History:  Diagnosis Date   Cataract    Complication of anesthesia    Awakens during surgery   Dyspnea    ESRD (end stage renal disease) (HCC)    [facility], [address]    Glaucoma    Neuropathy    hands - per patient   Peripheral vascular disease (HCC)    History reviewed. No pertinent family history. Past Surgical History:  Procedure Laterality Date   A/V FISTULAGRAM N/A 02/27/2024   Procedure: A/V Fistulagram;  Surgeon: Norine Manuelita LABOR, MD;  Location: [facility] INVASIVE CV LAB;  Service: Cardiovascular;  Laterality: N/A;   AMPUTATION Right 06/02/2020   Procedure: RIGHT FOOT 5TH RAY AMPUTATION;  Surgeon: Harden Jerona GAILS, MD;  Location: [facility] OR;  Service: Orthopedics;  Laterality: Right;   AV FISTULA PLACEMENT     EYE SURGERY     cataract bilateral   EYE SURGERY Right    FISTULA SUPERFICIALIZATION Right 10/28/2022   Procedure: RIGHT ARM ARTERIOVENOUS FISTULA REVISION WITH COMPLEX SKIN AND SOFT TISSUE CLOSURE;  Surgeon: Lanis Fonda BRAVO, MD;  Location: [facility] OR;  Service: Vascular;  Laterality: Right;   INSERTION OF DIALYSIS CATHETER Right 12/08/2020   Procedure: INSERTION OF DIALYSIS CATHETER USING 19cm PALINDROME CHRONIC CATHETER;  Surgeon: Sheree Penne Bruckner, MD;  Location: [facility] OR;  Service: Vascular;  Laterality: Right;    INSERTION OF DIALYSIS CATHETER Right 10/28/2022   Procedure: INSERTION OF RIGHT INTERNAL JUGULAR TUNNELED DIALYSIS CATHETER;  Surgeon: Lanis Fonda BRAVO, MD;  Location: [facility] OR;  Service: Vascular;  Laterality: Right;   REVISON OF ARTERIOVENOUS FISTULA Right 12/06/2020   Procedure: REVISON OF RIGHT ARM FISTULA;  Surgeon: Oris Krystal FALCON, MD;  Location: [facility] OR;  Service: Vascular;  Laterality: Right;   TOTAL HIP ARTHROPLASTY Right     Short Social History:  Social History   Tobacco Use   Smoking status: Never    Passive exposure: Never   Smokeless tobacco: Never  Substance Use Topics   Alcohol use: Never    Allergies  Allergen Reactions   Iron Dextran Shortness Of Breath    Infed Iron Infusion    Darvon [Propoxyphene] Other (See Comments)    Lungs filling up with fluid (reaction to Darvon/Darvocet)     Lactose Other (See Comments)    Gi upset/ sugar in milk   Milk (Cow) Other (See Comments)    Gi upset    Current Outpatient Medications  Medication Sig Dispense Refill   acetaminophen  (TYLENOL ) 650 MG CR tablet Take 325-650 mg by mouth See admin instructions. Take 650 mg in the morning and 325 mg in the evening     brimonidine  (ALPHAGAN ) 0.2 % ophthalmic solution Place 1 drop into both eyes 2 (two) times daily.     calcium elemental as carbonate (  TUMS ULTRA 1000) 400 MG chewable tablet Chew 1,000 mg by mouth daily as needed for heartburn.     cinacalcet  (SENSIPAR ) 60 MG tablet Take 60 mg by mouth daily with supper.     Docusate Sodium  (DSS) 100 MG CAPS Take 100 mg by mouth 2 (two) times daily.     dorzolamide -timolol  (COSOPT ) 2-0.5 % ophthalmic solution Place 1 drop into both eyes 2 (two) times daily.     fexofenadine (ALLEGRA) 180 MG tablet Take 180 mg by mouth daily.     Lactase (LACTOSE INTOLERANCE PO) Take 4 oz by mouth daily as needed (Dairy).     multivitamin (RENA-VIT) TABS tablet Take 1 tablet by mouth daily.     ROCKLATAN 0.02-0.005 % SOLN Apply 1 drop to eye at bedtime.      sevelamer  (RENAGEL ) 800 MG tablet Take 2,400 mg by mouth 3 (three) times daily with meals. Take 800 mg with snack     sevelamer  carbonate (RENVELA ) 800 MG tablet 4 tablets.     sulfamethoxazole -trimethoprim  (BACTRIM  DS) 800-160 MG tablet Take 1 tablet by mouth 2 (two) times daily for 14 days. 28 tablet 0   No current facility-administered medications for this visit.    Review of Systems  Constitutional:  Constitutional negative. HENT: HENT negative.  Eyes: Positive for loss of vision.   Respiratory: Respiratory negative.  Cardiovascular: Cardiovascular negative.  GI: Gastrointestinal negative.  Musculoskeletal:       Osteomyelitis left great toe Neurological: Neurological negative. Hematologic: Hematologic/lymphatic negative.  Psychiatric: Psychiatric negative.        Objective:  Objective   Vitals:   07/28/24 1335  BP: 112/66  Pulse: 61  Resp: 18  Temp: 98.4 F (36.9 C)  TempSrc: Temporal  SpO2: 99%  Weight: 127 lb (57.6 kg)  Height: 5' 8 (1.727 m)   Body mass index is 19.31 kg/m.  Physical Exam HENT:     Head: Normocephalic.  Cardiovascular:     Pulses:          Popliteal pulses are 2+ on the left side.       Dorsalis pedis pulses are 1+ on the left side.     Comments: Very strong triphasic signal left dorsalis pedis can be traced to the first web interspace Pulmonary:     Effort: Pulmonary effort is normal.  Abdominal:     General: Abdomen is flat.  Skin:    Comments: Dark discoloration left great toe  Neurological:     General: No focal deficit present.     Mental Status: He is alert.  Psychiatric:        Mood and Affect: Mood normal.        Thought Content: Thought content normal.        Judgment: Judgment normal.     Data: ABI Findings:  +---------+------------------+-----+----------------+--------+  Right   Rt Pressure (mmHg)IndexWaveform        Comment   +---------+------------------+-----+----------------+--------+  PTA     254                1.90 biphasic                  +---------+------------------+-----+----------------+--------+  DP      254               1.90 barely triphasic          +---------+------------------+-----+----------------+--------+  Great Toe171               1.28 Normal                    +---------+------------------+-----+----------------+--------+   +---------+------------------+-----+--------+-------+  Left    Lt Pressure (mmHg)IndexWaveformComment  +---------+------------------+-----+--------+-------+  Brachial 134                                     +---------+------------------+-----+--------+-------+  PTA     254               1.90 biphasic         +---------+------------------+-----+--------+-------+  DP      254               1.90 biphasic         +---------+------------------+-----+--------+-------+  [place] Toe134               1.00 Normal           +---------+------------------+-----+--------+-------+   +-------+----------------+-----------+----------------+------------+  ABI/TBIToday's ABI     Today's TBIPrevious ABI    Previous TBI  +-------+----------------+-----------+----------------+------------+  Right non-compressible1.28       non-compressible              +-------+----------------+-----------+----------------+------------+  Left  non-compressible1.00       non-compressible              +-------+----------------+-----------+----------------+------------+       TOES Findings:  +----------+---------------+--------+---------+  Right ToesPressure (mmHg)WaveformComment    +----------+---------------+--------+---------+  1st Digit                Normal  Dampened   +----------+---------------+--------+---------+  2nd Digit                Normal             +----------+---------------+--------+---------+  3rd Digit                Normal              +----------+---------------+--------+---------+  4th Digit                AbnormalModerate   +----------+---------------+--------+---------+  5th Digit                        Amputated  +----------+---------------+--------+---------+      +---------+---------------+--------+--------+  Left ToesPressure (mmHg)WaveformComment   +---------+---------------+--------+--------+  1st Digit               Normal            +---------+---------------+--------+--------+  2nd Digit               Normal            +---------+---------------+--------+--------+  3rd Digit               Normal            +---------+---------------+--------+--------+  4th Digit               Normal            +---------+---------------+--------+--------+  5th Digit               AbnormalModerate  +---------+---------------+--------+--------+         Bilateral ABIs appear essentially unchanged compared to prior study on  06/02/2020.    Summary:  Right: Resting right ankle-brachial index indicates noncompressible right  lower extremity arteries. The right toe-brachial index is normal.   Left: Resting left ankle-brachial index indicates noncompressible left  lower extremity arteries. The left toe-brachial index is normal.       Assessment/Plan:    68 year old male with  gangrene of the left great toe but has a palpable left dorsalis pedis pulse which can be traced with Doppler out to the first web interspace.  His toe pressures were essentially normal at last evaluation.  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  He can see us  on an as-needed basis.     Penne Lonni Colorado MD [facility]

## 2024-07-29 DIAGNOSIS — Z992 Dependence on renal dialysis: Secondary | ICD-10-CM | POA: Diagnosis not present

## 2024-07-29 DIAGNOSIS — N186 End stage renal disease: Secondary | ICD-10-CM | POA: Diagnosis not present

## 2024-07-29 DIAGNOSIS — N2581 Secondary hyperparathyroidism of renal origin: Secondary | ICD-10-CM | POA: Diagnosis not present

## 2024-07-30 ENCOUNTER — Telehealth: Payer: Self-pay | Admitting: Podiatry

## 2024-07-30 NOTE — Telephone Encounter (Signed)
 Received surgical consent form   Left message for pt to call to schedule the surgery.

## 2024-07-30 NOTE — Telephone Encounter (Signed)
 Pts mom called back and is aware surgery is scheduled for 8/26 and pt is not on any blood thinners or glp 1 medications.

## 2024-07-30 NOTE — Telephone Encounter (Signed)
 DOS- 08/10/2024  AMPUTATION TOE MPJ JOINT WITH LOCAL BLOCK (HALLUX) LT- 71179  [employer] EFFECTIVE DATE- 08/16/2022  DEDUCTIBLE- N/A OOP- $3300 REMAINING- $2376.47 COINSURANCE- 0%/$250 COPAY  PER [employer] AND [employer] WEBSITES, NO PRIOR AUTH IS REQUIRED FOR CPT CODE 71179. DOCUMENTATION IS ATTACHED TO SURGERY CONSENT PACKET.

## 2024-07-31 ENCOUNTER — Other Ambulatory Visit: Payer: Self-pay | Admitting: Podiatry

## 2024-07-31 DIAGNOSIS — N186 End stage renal disease: Secondary | ICD-10-CM | POA: Diagnosis not present

## 2024-07-31 DIAGNOSIS — Z992 Dependence on renal dialysis: Secondary | ICD-10-CM | POA: Diagnosis not present

## 2024-07-31 DIAGNOSIS — N2581 Secondary hyperparathyroidism of renal origin: Secondary | ICD-10-CM | POA: Diagnosis not present

## 2024-08-03 ENCOUNTER — Encounter: Payer: Self-pay | Admitting: Nephrology

## 2024-08-03 DIAGNOSIS — N2581 Secondary hyperparathyroidism of renal origin: Secondary | ICD-10-CM | POA: Diagnosis not present

## 2024-08-03 DIAGNOSIS — Z992 Dependence on renal dialysis: Secondary | ICD-10-CM | POA: Diagnosis not present

## 2024-08-03 DIAGNOSIS — N186 End stage renal disease: Secondary | ICD-10-CM | POA: Diagnosis not present

## 2024-08-04 ENCOUNTER — Telehealth: Payer: Self-pay | Admitting: *Deleted

## 2024-08-04 NOTE — Telephone Encounter (Signed)
 Patient is asking whether to continue with antibiotics? He is out, so if he does, he needs a refill.

## 2024-08-05 ENCOUNTER — Other Ambulatory Visit: Payer: Self-pay | Admitting: Podiatry

## 2024-08-05 DIAGNOSIS — N186 End stage renal disease: Secondary | ICD-10-CM | POA: Diagnosis not present

## 2024-08-05 DIAGNOSIS — Z992 Dependence on renal dialysis: Secondary | ICD-10-CM | POA: Diagnosis not present

## 2024-08-05 DIAGNOSIS — N2581 Secondary hyperparathyroidism of renal origin: Secondary | ICD-10-CM | POA: Diagnosis not present

## 2024-08-05 MED ORDER — SULFAMETHOXAZOLE-TRIMETHOPRIM 800-160 MG PO TABS: 1.0000 | ORAL_TABLET | Freq: Two times a day (BID) | ORAL | 0 refills | Status: AC

## 2024-08-07 DIAGNOSIS — N186 End stage renal disease: Secondary | ICD-10-CM | POA: Diagnosis not present

## 2024-08-07 DIAGNOSIS — N2581 Secondary hyperparathyroidism of renal origin: Secondary | ICD-10-CM | POA: Diagnosis not present

## 2024-08-07 DIAGNOSIS — Z992 Dependence on renal dialysis: Secondary | ICD-10-CM | POA: Diagnosis not present

## 2024-08-09 DIAGNOSIS — N2581 Secondary hyperparathyroidism of renal origin: Secondary | ICD-10-CM | POA: Diagnosis not present

## 2024-08-09 DIAGNOSIS — Z992 Dependence on renal dialysis: Secondary | ICD-10-CM | POA: Diagnosis not present

## 2024-08-09 DIAGNOSIS — N186 End stage renal disease: Secondary | ICD-10-CM | POA: Diagnosis not present

## 2024-08-10 ENCOUNTER — Other Ambulatory Visit: Payer: Self-pay | Admitting: Podiatry

## 2024-08-10 DIAGNOSIS — M86172 Other acute osteomyelitis, left ankle and foot: Secondary | ICD-10-CM | POA: Diagnosis not present

## 2024-08-10 DIAGNOSIS — M868X7 Other osteomyelitis, ankle and foot: Secondary | ICD-10-CM | POA: Diagnosis not present

## 2024-08-10 DIAGNOSIS — M86672 Other chronic osteomyelitis, left ankle and foot: Secondary | ICD-10-CM | POA: Diagnosis not present

## 2024-08-10 DIAGNOSIS — M869 Osteomyelitis, unspecified: Secondary | ICD-10-CM | POA: Diagnosis not present

## 2024-08-10 MED ORDER — ONDANSETRON HCL 4 MG PO TABS: 4.0000 mg | ORAL_TABLET | Freq: Three times a day (TID) | ORAL | 0 refills | Status: DC | PRN

## 2024-08-10 MED ORDER — OXYCODONE-ACETAMINOPHEN 5-325 MG PO TABS: 1.0000 | ORAL_TABLET | ORAL | 0 refills | Status: AC | PRN

## 2024-08-14 DIAGNOSIS — N186 End stage renal disease: Secondary | ICD-10-CM | POA: Diagnosis not present

## 2024-08-14 DIAGNOSIS — N2581 Secondary hyperparathyroidism of renal origin: Secondary | ICD-10-CM | POA: Diagnosis not present

## 2024-08-14 DIAGNOSIS — Z992 Dependence on renal dialysis: Secondary | ICD-10-CM | POA: Diagnosis not present

## 2024-08-15 DIAGNOSIS — I129 Hypertensive chronic kidney disease with stage 1 through stage 4 chronic kidney disease, or unspecified chronic kidney disease: Secondary | ICD-10-CM | POA: Diagnosis not present

## 2024-08-15 DIAGNOSIS — N186 End stage renal disease: Secondary | ICD-10-CM | POA: Diagnosis not present

## 2024-08-15 DIAGNOSIS — Z992 Dependence on renal dialysis: Secondary | ICD-10-CM | POA: Diagnosis not present

## 2024-08-17 ENCOUNTER — Encounter: Admitting: Podiatry

## 2024-08-17 DIAGNOSIS — N2581 Secondary hyperparathyroidism of renal origin: Secondary | ICD-10-CM | POA: Diagnosis not present

## 2024-08-17 DIAGNOSIS — Z992 Dependence on renal dialysis: Secondary | ICD-10-CM | POA: Diagnosis not present

## 2024-08-17 DIAGNOSIS — N186 End stage renal disease: Secondary | ICD-10-CM | POA: Diagnosis not present

## 2024-08-18 ENCOUNTER — Ambulatory Visit (INDEPENDENT_AMBULATORY_CARE_PROVIDER_SITE_OTHER): Admitting: Podiatry

## 2024-08-18 ENCOUNTER — Encounter: Payer: Self-pay | Admitting: Podiatry

## 2024-08-18 VITALS — BP 103/60 | HR 58 | Temp 97.3°F

## 2024-08-18 DIAGNOSIS — Z9889 Other specified postprocedural states: Secondary | ICD-10-CM

## 2024-08-18 DIAGNOSIS — M869 Osteomyelitis, unspecified: Secondary | ICD-10-CM | POA: Diagnosis not present

## 2024-08-18 NOTE — Progress Notes (Signed)
 Subjective:  Patient ID: Timothy Ponce, male    DOB: [birth year],  MRN: [id number]  Chief Complaint  Patient presents with   Routine Post Op    POV#1 DOS 08/10/2024 - AMPUTATION TOE 1ST LEFT It's alright as long as I alternate the Percocet and the Tylenol .  My pain level is about a one today.  If I move around a lot, I feel the pain.     DOS: 08/10/24 Procedure: Left great toe amputation   68 y.o. male returns for POV#1. Relates doing well and managing with pain  Review of Systems: Negative except as noted in the HPI. Denies N/V/F/Ch.  Past Medical History:  Diagnosis Date   Cataract    Complication of anesthesia    Awakens during surgery   Dyspnea    ESRD (end stage renal disease) (HCC)    [facility], [address]    Glaucoma    Neuropathy    hands - per patient   Peripheral vascular disease (HCC)     Current Outpatient Medications:    acetaminophen  (TYLENOL ) 650 MG CR tablet, Take 325-650 mg by mouth See admin instructions. Take 650 mg in the morning and 325 mg in the evening, Disp: , Rfl:    brimonidine  (ALPHAGAN ) 0.2 % ophthalmic solution, Place 1 drop into both eyes 2 (two) times daily., Disp: , Rfl:    calcium elemental as carbonate (TUMS ULTRA 1000) 400 MG chewable tablet, Chew 1,000 mg by mouth daily as needed for heartburn., Disp: , Rfl:    cinacalcet  (SENSIPAR ) 60 MG tablet, Take 60 mg by mouth daily with supper., Disp: , Rfl:    Docusate Sodium  (DSS) 100 MG CAPS, Take 100 mg by mouth 2 (two) times daily., Disp: , Rfl:    dorzolamide -timolol  (COSOPT ) 2-0.5 % ophthalmic solution, Place 1 drop into both eyes 2 (two) times daily., Disp: , Rfl:    fexofenadine (ALLEGRA) 180 MG tablet, Take 180 mg by mouth daily., Disp: , Rfl:    Lactase (LACTOSE INTOLERANCE PO), Take 4 oz by mouth daily as needed (Dairy)., Disp: , Rfl:    multivitamin (RENA-VIT) TABS tablet, Take 1 tablet by mouth daily., Disp: , Rfl:    ondansetron  (ZOFRAN ) 4 MG tablet, Take 1 tablet (4 mg total)  by mouth every 8 (eight) hours as needed for nausea or vomiting., Disp: 20 tablet, Rfl: 0   ROCKLATAN 0.02-0.005 % SOLN, Apply 1 drop to eye at bedtime., Disp: , Rfl:    sevelamer  (RENAGEL ) 800 MG tablet, Take 2,400 mg by mouth 3 (three) times daily with meals. Take 800 mg with snack, Disp: , Rfl:    sevelamer  carbonate (RENVELA ) 800 MG tablet, 4 tablets., Disp: , Rfl:   Social History   Tobacco Use  Smoking Status Never   Passive exposure: Never  Smokeless Tobacco Never    Allergies  Allergen Reactions   Iron Dextran Shortness Of Breath    Infed Iron Infusion    Darvon [Propoxyphene] Other (See Comments)    Lungs filling up with fluid (reaction to Darvon/Darvocet)     Lactose Other (See Comments)    Gi upset/ sugar in milk   Milk (Cow) Other (See Comments)    Gi upset   Objective:   Vitals:   08/18/24 1429  BP: 103/60  Pulse: (!) 58  Temp: (!) 97.3 F (36.3 C)   There is no height or weight on file to calculate BMI. Constitutional Well developed. Well nourished.  Vascular Foot warm and well perfused. Capillary  refill normal to all digits.   Neurologic Normal speech. Oriented to person, place, and time. Epicritic sensation to light touch grossly present bilaterally.  Dermatologic Skin healing well without signs of infection. Skin edges well coapted without signs of infection.  Orthopedic: Tenderness to palpation noted about the surgical site.    Assessment:   1. Osteomyelitis of great toe of left foot (HCC)   2. Status post surgery    Plan:  Patient was evaluated and treated and all questions answered.  S/p foot surgery left -Progressing as expected post-operatively. -WB Status: WBAT in Surgical shoe . Additonal surgical shoe dispensed.  -Sutures: intact. -Medications: n/a -Foot redressed.  Return in 2 weeks for suture removal.   Return in about 2 weeks (around 09/01/2024) for post op.

## 2024-08-19 DIAGNOSIS — N2581 Secondary hyperparathyroidism of renal origin: Secondary | ICD-10-CM | POA: Diagnosis not present

## 2024-08-19 DIAGNOSIS — Z992 Dependence on renal dialysis: Secondary | ICD-10-CM | POA: Diagnosis not present

## 2024-08-19 DIAGNOSIS — N186 End stage renal disease: Secondary | ICD-10-CM | POA: Diagnosis not present

## 2024-08-21 DIAGNOSIS — N186 End stage renal disease: Secondary | ICD-10-CM | POA: Diagnosis not present

## 2024-08-21 DIAGNOSIS — N2581 Secondary hyperparathyroidism of renal origin: Secondary | ICD-10-CM | POA: Diagnosis not present

## 2024-08-21 DIAGNOSIS — Z992 Dependence on renal dialysis: Secondary | ICD-10-CM | POA: Diagnosis not present

## 2024-08-24 DIAGNOSIS — N2581 Secondary hyperparathyroidism of renal origin: Secondary | ICD-10-CM | POA: Diagnosis not present

## 2024-08-24 DIAGNOSIS — Z992 Dependence on renal dialysis: Secondary | ICD-10-CM | POA: Diagnosis not present

## 2024-08-24 DIAGNOSIS — N186 End stage renal disease: Secondary | ICD-10-CM | POA: Diagnosis not present

## 2024-08-25 DIAGNOSIS — E8582 Wild-type transthyretin-related (ATTR) amyloidosis: Secondary | ICD-10-CM | POA: Diagnosis not present

## 2024-08-25 DIAGNOSIS — M869 Osteomyelitis, unspecified: Secondary | ICD-10-CM | POA: Diagnosis not present

## 2024-08-25 DIAGNOSIS — N186 End stage renal disease: Secondary | ICD-10-CM | POA: Diagnosis not present

## 2024-08-25 DIAGNOSIS — I120 Hypertensive chronic kidney disease with stage 5 chronic kidney disease or end stage renal disease: Secondary | ICD-10-CM | POA: Diagnosis not present

## 2024-08-25 DIAGNOSIS — Z992 Dependence on renal dialysis: Secondary | ICD-10-CM | POA: Diagnosis not present

## 2024-08-26 DIAGNOSIS — N2581 Secondary hyperparathyroidism of renal origin: Secondary | ICD-10-CM | POA: Diagnosis not present

## 2024-08-26 DIAGNOSIS — N186 End stage renal disease: Secondary | ICD-10-CM | POA: Diagnosis not present

## 2024-08-26 DIAGNOSIS — Z992 Dependence on renal dialysis: Secondary | ICD-10-CM | POA: Diagnosis not present

## 2024-08-28 DIAGNOSIS — N186 End stage renal disease: Secondary | ICD-10-CM | POA: Diagnosis not present

## 2024-08-28 DIAGNOSIS — Z992 Dependence on renal dialysis: Secondary | ICD-10-CM | POA: Diagnosis not present

## 2024-08-28 DIAGNOSIS — N2581 Secondary hyperparathyroidism of renal origin: Secondary | ICD-10-CM | POA: Diagnosis not present

## 2024-08-31 ENCOUNTER — Ambulatory Visit (INDEPENDENT_AMBULATORY_CARE_PROVIDER_SITE_OTHER): Admitting: Podiatry

## 2024-08-31 DIAGNOSIS — Z992 Dependence on renal dialysis: Secondary | ICD-10-CM | POA: Diagnosis not present

## 2024-08-31 DIAGNOSIS — Z91199 Patient's noncompliance with other medical treatment and regimen due to unspecified reason: Secondary | ICD-10-CM

## 2024-08-31 DIAGNOSIS — N2581 Secondary hyperparathyroidism of renal origin: Secondary | ICD-10-CM | POA: Diagnosis not present

## 2024-08-31 DIAGNOSIS — N186 End stage renal disease: Secondary | ICD-10-CM | POA: Diagnosis not present

## 2024-08-31 NOTE — Progress Notes (Signed)
 No show

## 2024-09-01 ENCOUNTER — Encounter: Payer: Self-pay | Admitting: Podiatry

## 2024-09-01 ENCOUNTER — Ambulatory Visit (INDEPENDENT_AMBULATORY_CARE_PROVIDER_SITE_OTHER): Admitting: Podiatry

## 2024-09-01 VITALS — BP 152/86 | HR 61 | Temp 96.6°F

## 2024-09-01 DIAGNOSIS — M869 Osteomyelitis, unspecified: Secondary | ICD-10-CM

## 2024-09-01 DIAGNOSIS — Z9889 Other specified postprocedural states: Secondary | ICD-10-CM

## 2024-09-01 NOTE — Progress Notes (Signed)
 Subjective:  Patient ID: Timothy Ponce, male    DOB: 1956-05-31,  MRN: [id number]  No chief complaint on file.   DOS: 08/10/24 Procedure: Left great toe amputation   68 y.o. male returns for POV#2. Relates doing well and managing with pain  Review of Systems: Negative except as noted in the HPI. Denies N/V/F/Ch.  Past Medical History:  Diagnosis Date   Cataract    Complication of anesthesia    Awakens during surgery   Dyspnea    ESRD (end stage renal disease) (HCC)    [facility], [address]    Glaucoma    Neuropathy    hands - per patient   Peripheral vascular disease (HCC)     Current Outpatient Medications:    acetaminophen  (TYLENOL ) 650 MG CR tablet, Take 325-650 mg by mouth See admin instructions. Take 650 mg in the morning and 325 mg in the evening, Disp: , Rfl:    brimonidine  (ALPHAGAN ) 0.2 % ophthalmic solution, Place 1 drop into both eyes 2 (two) times daily., Disp: , Rfl:    calcium elemental as carbonate (TUMS ULTRA 1000) 400 MG chewable tablet, Chew 1,000 mg by mouth daily as needed for heartburn., Disp: , Rfl:    cinacalcet  (SENSIPAR ) 60 MG tablet, Take 60 mg by mouth daily with supper., Disp: , Rfl:    Docusate Sodium  (DSS) 100 MG CAPS, Take 100 mg by mouth 2 (two) times daily., Disp: , Rfl:    dorzolamide -timolol  (COSOPT ) 2-0.5 % ophthalmic solution, Place 1 drop into both eyes 2 (two) times daily., Disp: , Rfl:    fexofenadine (ALLEGRA) 180 MG tablet, Take 180 mg by mouth daily., Disp: , Rfl:    Lactase (LACTOSE INTOLERANCE PO), Take 4 oz by mouth daily as needed (Dairy)., Disp: , Rfl:    multivitamin (RENA-VIT) TABS tablet, Take 1 tablet by mouth daily., Disp: , Rfl:    ondansetron  (ZOFRAN ) 4 MG tablet, Take 1 tablet (4 mg total) by mouth every 8 (eight) hours as needed for nausea or vomiting., Disp: 20 tablet, Rfl: 0   ROCKLATAN 0.02-0.005 % SOLN, Apply 1 drop to eye at bedtime., Disp: , Rfl:    sevelamer  (RENAGEL ) 800 MG tablet, Take 2,400 mg by mouth 3  (three) times daily with meals. Take 800 mg with snack, Disp: , Rfl:    sevelamer  carbonate (RENVELA ) 800 MG tablet, 4 tablets., Disp: , Rfl:   Social History   Tobacco Use  Smoking Status Never   Passive exposure: Never  Smokeless Tobacco Never    Allergies  Allergen Reactions   Iron Dextran Shortness Of Breath    Infed Iron Infusion    Darvon [Propoxyphene] Other (See Comments)    Lungs filling up with fluid (reaction to Darvon/Darvocet)     Lactose Other (See Comments)    Gi upset/ sugar in milk   Milk (Cow) Other (See Comments)    Gi upset   Objective:   There were no vitals filed for this visit.  There is no height or weight on file to calculate BMI. Constitutional Well developed. Well nourished.  Vascular Foot warm and well perfused. Capillary refill normal to all digits.   Neurologic Normal speech. Oriented to person, place, and time. Epicritic sensation to light touch grossly present bilaterally.  Dermatologic Skin healing well without signs of infection. Skin edges well coapted without signs of infection.  Orthopedic: Tenderness to palpation noted about the surgical site.    Assessment:   1. Osteomyelitis of great toe of  left foot (HCC)   2. Status post surgery    Plan:  Patient was evaluated and treated and all questions answered.  S/p foot surgery left -Progressing as expected post-operatively. -WB Status: WBAT in Surgical shoe may transition to regular shoe.  -Sutures: removed today without incdient.  -Medications: n/a -Foot redressed.  Return in 3 weeks for recheck. .   No follow-ups on file.

## 2024-09-02 DIAGNOSIS — N186 End stage renal disease: Secondary | ICD-10-CM | POA: Diagnosis not present

## 2024-09-02 DIAGNOSIS — N2581 Secondary hyperparathyroidism of renal origin: Secondary | ICD-10-CM | POA: Diagnosis not present

## 2024-09-02 DIAGNOSIS — Z992 Dependence on renal dialysis: Secondary | ICD-10-CM | POA: Diagnosis not present

## 2024-09-04 DIAGNOSIS — N186 End stage renal disease: Secondary | ICD-10-CM | POA: Diagnosis not present

## 2024-09-04 DIAGNOSIS — N2581 Secondary hyperparathyroidism of renal origin: Secondary | ICD-10-CM | POA: Diagnosis not present

## 2024-09-04 DIAGNOSIS — Z992 Dependence on renal dialysis: Secondary | ICD-10-CM | POA: Diagnosis not present

## 2024-09-07 DIAGNOSIS — N186 End stage renal disease: Secondary | ICD-10-CM | POA: Diagnosis not present

## 2024-09-07 DIAGNOSIS — N2581 Secondary hyperparathyroidism of renal origin: Secondary | ICD-10-CM | POA: Diagnosis not present

## 2024-09-07 DIAGNOSIS — Z992 Dependence on renal dialysis: Secondary | ICD-10-CM | POA: Diagnosis not present

## 2024-09-09 ENCOUNTER — Encounter: Payer: Self-pay | Admitting: Podiatry

## 2024-09-09 DIAGNOSIS — N2581 Secondary hyperparathyroidism of renal origin: Secondary | ICD-10-CM | POA: Diagnosis not present

## 2024-09-09 DIAGNOSIS — Z992 Dependence on renal dialysis: Secondary | ICD-10-CM | POA: Diagnosis not present

## 2024-09-09 DIAGNOSIS — N186 End stage renal disease: Secondary | ICD-10-CM | POA: Diagnosis not present

## 2024-09-11 DIAGNOSIS — Z992 Dependence on renal dialysis: Secondary | ICD-10-CM | POA: Diagnosis not present

## 2024-09-11 DIAGNOSIS — N186 End stage renal disease: Secondary | ICD-10-CM | POA: Diagnosis not present

## 2024-09-11 DIAGNOSIS — N2581 Secondary hyperparathyroidism of renal origin: Secondary | ICD-10-CM | POA: Diagnosis not present

## 2024-09-14 DIAGNOSIS — N186 End stage renal disease: Secondary | ICD-10-CM | POA: Diagnosis not present

## 2024-09-14 DIAGNOSIS — I129 Hypertensive chronic kidney disease with stage 1 through stage 4 chronic kidney disease, or unspecified chronic kidney disease: Secondary | ICD-10-CM | POA: Diagnosis not present

## 2024-09-14 DIAGNOSIS — N2581 Secondary hyperparathyroidism of renal origin: Secondary | ICD-10-CM | POA: Diagnosis not present

## 2024-09-14 DIAGNOSIS — Z992 Dependence on renal dialysis: Secondary | ICD-10-CM | POA: Diagnosis not present

## 2024-09-16 DIAGNOSIS — Z992 Dependence on renal dialysis: Secondary | ICD-10-CM | POA: Diagnosis not present

## 2024-09-16 DIAGNOSIS — N186 End stage renal disease: Secondary | ICD-10-CM | POA: Diagnosis not present

## 2024-09-16 DIAGNOSIS — N2581 Secondary hyperparathyroidism of renal origin: Secondary | ICD-10-CM | POA: Diagnosis not present

## 2024-09-22 ENCOUNTER — Encounter: Payer: Self-pay | Admitting: Podiatry

## 2024-09-22 ENCOUNTER — Ambulatory Visit (INDEPENDENT_AMBULATORY_CARE_PROVIDER_SITE_OTHER): Admitting: Podiatry

## 2024-09-22 DIAGNOSIS — M869 Osteomyelitis, unspecified: Secondary | ICD-10-CM | POA: Diagnosis not present

## 2024-09-22 DIAGNOSIS — I739 Peripheral vascular disease, unspecified: Secondary | ICD-10-CM

## 2024-09-22 DIAGNOSIS — Z9889 Other specified postprocedural states: Secondary | ICD-10-CM | POA: Diagnosis not present

## 2024-09-22 NOTE — Progress Notes (Signed)
 Subjective:  Patient ID: Timothy Ponce, male    DOB: [birth year],  MRN: [id number]  Chief Complaint  Patient presents with   Routine Post Op    DOS: 08/10/24  Left great toe amputation  It's doing good.  I don't have to take the Tylenol  as much now.  I don't take the Oxycontin  anymore.     DOS: 08/10/24 Procedure: Left great toe amputation   68 y.o. male returns for POV#3. Relates doing well and managing with pain  Review of Systems: Negative except as noted in the HPI. Denies N/V/F/Ch.  Past Medical History:  Diagnosis Date   Cataract    Complication of anesthesia    Awakens during surgery   Dyspnea    ESRD (end stage renal disease) (HCC)    [facility], [address]    Glaucoma    Neuropathy    hands - per patient   Peripheral vascular disease     Current Outpatient Medications:    acetaminophen  (TYLENOL ) 650 MG CR tablet, Take 325-650 mg by mouth See admin instructions. Take 650 mg in the morning and 325 mg in the evening, Disp: , Rfl:    brimonidine  (ALPHAGAN ) 0.2 % ophthalmic solution, Place 1 drop into both eyes 2 (two) times daily., Disp: , Rfl:    calcium elemental as carbonate (TUMS ULTRA 1000) 400 MG chewable tablet, Chew 1,000 mg by mouth daily as needed for heartburn., Disp: , Rfl:    cinacalcet  (SENSIPAR ) 60 MG tablet, Take 60 mg by mouth daily with supper., Disp: , Rfl:    Docusate Sodium  (DSS) 100 MG CAPS, Take 100 mg by mouth 2 (two) times daily., Disp: , Rfl:    dorzolamide -timolol  (COSOPT ) 2-0.5 % ophthalmic solution, Place 1 drop into both eyes 2 (two) times daily., Disp: , Rfl:    fexofenadine (ALLEGRA) 180 MG tablet, Take 180 mg by mouth daily., Disp: , Rfl:    Lactase (LACTOSE INTOLERANCE PO), Take 4 oz by mouth daily as needed (Dairy)., Disp: , Rfl:    multivitamin (RENA-VIT) TABS tablet, Take 1 tablet by mouth daily., Disp: , Rfl:    ROCKLATAN 0.02-0.005 % SOLN, Apply 1 drop to eye at bedtime., Disp: , Rfl:    sevelamer  (RENAGEL ) 800 MG tablet,  Take 2,400 mg by mouth 3 (three) times daily with meals. Take 800 mg with snack, Disp: , Rfl:    sevelamer  carbonate (RENVELA ) 800 MG tablet, 4 tablets., Disp: , Rfl:    ondansetron  (ZOFRAN ) 4 MG tablet, Take 1 tablet (4 mg total) by mouth every 8 (eight) hours as needed for nausea or vomiting. (Patient not taking: Reported on 09/22/2024), Disp: 20 tablet, Rfl: 0  Social History   Tobacco Use  Smoking Status Never   Passive exposure: Never  Smokeless Tobacco Never    Allergies  Allergen Reactions   Iron Dextran Shortness Of Breath    Infed Iron Infusion    Darvon [Propoxyphene] Other (See Comments)    Lungs filling up with fluid (reaction to Darvon/Darvocet)     Lactose Other (See Comments)    Gi upset/ sugar in milk   Milk (Cow) Other (See Comments)    Gi upset   Objective:   There were no vitals filed for this visit.  There is no height or weight on file to calculate BMI. Constitutional Well developed. Well nourished.  Vascular Foot warm and well perfused. Capillary refill normal to all digits.   Neurologic Normal speech. Oriented to person, place, and time. Epicritic sensation  to light touch grossly present bilaterally.  Dermatologic Skin healing well without signs of infection. Skin edges well coapted without signs of infection.  Orthopedic: Tenderness to palpation noted about the surgical site.    Assessment:   1. Osteomyelitis of great toe of left foot (HCC)   2. Status post surgery   3. Peripheral arterial disease    Plan:  Patient was evaluated and treated and all questions answered.  S/p foot surgery left -Progressing as expected post-operatively. -WB Status: WBATin regular shoes.  -Medications: n/a  Return in 6 weeks for recheck and rfc.   Return in about 6 weeks (around 11/03/2024) for rfc.

## 2024-09-23 DIAGNOSIS — N2581 Secondary hyperparathyroidism of renal origin: Secondary | ICD-10-CM | POA: Diagnosis not present

## 2024-09-23 DIAGNOSIS — N186 End stage renal disease: Secondary | ICD-10-CM | POA: Diagnosis not present

## 2024-09-23 DIAGNOSIS — Z992 Dependence on renal dialysis: Secondary | ICD-10-CM | POA: Diagnosis not present

## 2024-09-28 DIAGNOSIS — N2581 Secondary hyperparathyroidism of renal origin: Secondary | ICD-10-CM | POA: Diagnosis not present

## 2024-09-28 DIAGNOSIS — N186 End stage renal disease: Secondary | ICD-10-CM | POA: Diagnosis not present

## 2024-09-28 DIAGNOSIS — Z992 Dependence on renal dialysis: Secondary | ICD-10-CM | POA: Diagnosis not present

## 2024-09-29 DIAGNOSIS — H401133 Primary open-angle glaucoma, bilateral, severe stage: Secondary | ICD-10-CM | POA: Diagnosis not present

## 2024-09-29 DIAGNOSIS — Z961 Presence of intraocular lens: Secondary | ICD-10-CM | POA: Diagnosis not present

## 2024-09-30 DIAGNOSIS — N2581 Secondary hyperparathyroidism of renal origin: Secondary | ICD-10-CM | POA: Diagnosis not present

## 2024-09-30 DIAGNOSIS — Z992 Dependence on renal dialysis: Secondary | ICD-10-CM | POA: Diagnosis not present

## 2024-10-02 DIAGNOSIS — N186 End stage renal disease: Secondary | ICD-10-CM | POA: Diagnosis not present

## 2024-10-04 DIAGNOSIS — N186 End stage renal disease: Secondary | ICD-10-CM | POA: Diagnosis not present

## 2024-10-06 ENCOUNTER — Ambulatory Visit: Attending: Vascular Surgery | Admitting: Vascular Surgery

## 2024-10-06 ENCOUNTER — Other Ambulatory Visit: Payer: Self-pay

## 2024-10-06 ENCOUNTER — Encounter: Payer: Self-pay | Admitting: Vascular Surgery

## 2024-10-06 VITALS — BP 134/83 | HR 55 | Temp 97.9°F

## 2024-10-06 DIAGNOSIS — N186 End stage renal disease: Secondary | ICD-10-CM

## 2024-10-06 NOTE — H&P (View-Only) (Signed)
 Patient ID: Timothy Ponce, male   DOB: Nov 06, 1956, 68 y.o.   MRN: [id number]  Reason for Consult: Follow-up   Referred by Timothy Bare, MD  Subjective:     HPI:  Timothy Ponce is a 68 y.o. male history of end-stage renal disease dialyzing via right arm AV fistula.  He has been on dialysis for over 30 years.  Recently evaluated for peripheral vascular disease now status post left great toe amputation which she states is healing well.  He is here today for scab formation over his right forearm AV fistula.  He does have a history of revision of this fistula 2 years ago at that time had a right IJ tunneled catheter placed.  Does have a history of left upper extremity access which is failed and has history of multiple bilateral tunneled catheters for dialysis access.  Currently dialyzing Tuesdays, Thursdays and Saturdays.  Past Medical History:  Diagnosis Date   Cataract    Complication of anesthesia    Awakens during surgery   Dyspnea    ESRD (end stage renal disease) (HCC)    [facility], [address]    Glaucoma    Neuropathy    hands - per patient   Peripheral vascular disease    History reviewed. No pertinent family history. Past Surgical History:  Procedure Laterality Date   A/V FISTULAGRAM N/A 02/27/2024   Procedure: A/V Fistulagram;  Surgeon: Norine Manuelita LABOR, MD;  Location: [facility] INVASIVE CV LAB;  Service: Cardiovascular;  Laterality: N/A;   AMPUTATION Right 06/02/2020   Procedure: RIGHT FOOT 5TH RAY AMPUTATION;  Surgeon: Harden Jerona GAILS, MD;  Location: [facility] OR;  Service: Orthopedics;  Laterality: Right;   AV FISTULA PLACEMENT     EYE SURGERY     cataract bilateral   EYE SURGERY Right    FISTULA SUPERFICIALIZATION Right 10/28/2022   Procedure: RIGHT ARM ARTERIOVENOUS FISTULA REVISION WITH COMPLEX SKIN AND SOFT TISSUE CLOSURE;  Surgeon: Lanis Fonda BRAVO, MD;  Location: [facility] OR;  Service: Vascular;  Laterality: Right;   INSERTION OF DIALYSIS CATHETER Right 12/08/2020    Procedure: INSERTION OF DIALYSIS CATHETER USING 19cm PALINDROME CHRONIC CATHETER;  Surgeon: Sheree Penne Bruckner, MD;  Location: [facility] OR;  Service: Vascular;  Laterality: Right;   INSERTION OF DIALYSIS CATHETER Right 10/28/2022   Procedure: INSERTION OF RIGHT INTERNAL JUGULAR TUNNELED DIALYSIS CATHETER;  Surgeon: Lanis Fonda BRAVO, MD;  Location: [facility] OR;  Service: Vascular;  Laterality: Right;   REVISON OF ARTERIOVENOUS FISTULA Right 12/06/2020   Procedure: REVISON OF RIGHT ARM FISTULA;  Surgeon: Oris Krystal FALCON, MD;  Location: [facility] OR;  Service: Vascular;  Laterality: Right;   TOTAL HIP ARTHROPLASTY Left     Short Social History:  Social History   Tobacco Use   Smoking status: Never    Passive exposure: Never   Smokeless tobacco: Never  Substance Use Topics   Alcohol use: Never    Allergies  Allergen Reactions   Iron Dextran Shortness Of Breath    Infed Iron Infusion    Darvon [Propoxyphene] Other (See Comments)    Lungs filling up with fluid (reaction to Darvon/Darvocet)     Lactose Other (See Comments)    Gi upset/ sugar in milk   Milk (Cow) Other (See Comments)    Gi upset    Current Outpatient Medications  Medication Sig Dispense Refill   acetaminophen  (TYLENOL ) 650 MG CR tablet Take 325-650 mg by mouth See admin instructions. Take 650 mg in the morning and 325  mg in the evening     brimonidine  (ALPHAGAN ) 0.2 % ophthalmic solution Place 1 drop into both eyes 2 (two) times daily.     calcium elemental as carbonate (TUMS ULTRA 1000) 400 MG chewable tablet Chew 1,000 mg by mouth daily as needed for heartburn.     cinacalcet  (SENSIPAR ) 60 MG tablet Take 60 mg by mouth daily with supper.     Docusate Sodium  (DSS) 100 MG CAPS Take 100 mg by mouth 2 (two) times daily.     dorzolamide -timolol  (COSOPT ) 2-0.5 % ophthalmic solution Place 1 drop into both eyes 2 (two) times daily.     fexofenadine (ALLEGRA) 180 MG tablet Take 180 mg by mouth daily.     Lactase (LACTOSE INTOLERANCE PO)  Take 4 oz by mouth daily as needed (Dairy).     multivitamin (RENA-VIT) TABS tablet Take 1 tablet by mouth daily.     ondansetron  (ZOFRAN ) 4 MG tablet Take 1 tablet (4 mg total) by mouth every 8 (eight) hours as needed for nausea or vomiting. (Patient not taking: Reported on 09/22/2024) 20 tablet 0   ROCKLATAN 0.02-0.005 % SOLN Apply 1 drop to eye at bedtime.     sevelamer  (RENAGEL ) 800 MG tablet Take 2,400 mg by mouth 3 (three) times daily with meals. Take 800 mg with snack     sevelamer  carbonate (RENVELA ) 800 MG tablet 4 tablets.     No current facility-administered medications for this visit.    Review of Systems  Constitutional:  Constitutional negative. HENT: HENT negative.  Eyes: Eyes negative.  Cardiovascular: Cardiovascular negative.  GI: Gastrointestinal negative.  Musculoskeletal: Musculoskeletal negative.  Skin: Positive for wound.  Neurological: Neurological negative. Hematologic: Hematologic/lymphatic negative.  Psychiatric: Psychiatric negative.        Objective:  Objective   Vitals:   10/06/24 1333  BP: 134/83  Pulse: (!) 55  Temp: 97.9 F (36.6 C)  SpO2: 90%     Physical Exam HENT:     Head: Normocephalic.     Nose: Nose normal.  Cardiovascular:     Rate and Rhythm: Normal rate.  Pulmonary:     Effort: Pulmonary effort is normal.  Abdominal:     General: Abdomen is flat.  Musculoskeletal:        General: Normal range of motion.     Cervical back: Normal range of motion.     Comments: Fistula with mild pulsatility and scab as pictured below  Skin:    General: Skin is warm.     Capillary Refill: Capillary refill takes less than 2 seconds.  Neurological:     Mental Status: He is alert.            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  We have discussed the need for revision of  this ulcerated area to prevent bleeding.  He will likely require catheter at that time until the fistula heals.  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  We discussed catheter could possibly be placed back in the right IJ more than likely will require femoral catheter and he demonstrates good understanding.    Plan for right arm fistula revision and tunneled  dialysis catheter on a nondialysis day in the near future.     Penne Lonni Colorado MD [facility]

## 2024-10-06 NOTE — Progress Notes (Signed)
 Patient ID: Timothy Ponce, male   DOB: Nov 06, 1956, 68 y.o.   MRN: [id number]  Reason for Consult: Follow-up   Referred by Catalina Bare, MD  Subjective:     HPI:  Timothy Ponce is a 68 y.o. male history of end-stage renal disease dialyzing via right arm AV fistula.  He has been on dialysis for over 30 years.  Recently evaluated for peripheral vascular disease now status post left great toe amputation which she states is healing well.  He is here today for scab formation over his right forearm AV fistula.  He does have a history of revision of this fistula 2 years ago at that time had a right IJ tunneled catheter placed.  Does have a history of left upper extremity access which is failed and has history of multiple bilateral tunneled catheters for dialysis access.  Currently dialyzing Tuesdays, Thursdays and Saturdays.  Past Medical History:  Diagnosis Date   Cataract    Complication of anesthesia    Awakens during surgery   Dyspnea    ESRD (end stage renal disease) (HCC)    TTHSAT, [address]    Glaucoma    Neuropathy    hands - per patient   Peripheral vascular disease    History reviewed. No pertinent family history. Past Surgical History:  Procedure Laterality Date   A/V FISTULAGRAM N/A 02/27/2024   Procedure: A/V Fistulagram;  Surgeon: Norine Manuelita LABOR, MD;  Location: [facility] INVASIVE CV LAB;  Service: Cardiovascular;  Laterality: N/A;   AMPUTATION Right 06/02/2020   Procedure: RIGHT FOOT 5TH RAY AMPUTATION;  Surgeon: Harden Jerona GAILS, MD;  Location: [facility] OR;  Service: Orthopedics;  Laterality: Right;   AV FISTULA PLACEMENT     EYE SURGERY     cataract bilateral   EYE SURGERY Right    FISTULA SUPERFICIALIZATION Right 10/28/2022   Procedure: RIGHT ARM ARTERIOVENOUS FISTULA REVISION WITH COMPLEX SKIN AND SOFT TISSUE CLOSURE;  Surgeon: Lanis Fonda BRAVO, MD;  Location: [facility] OR;  Service: Vascular;  Laterality: Right;   INSERTION OF DIALYSIS CATHETER Right 12/08/2020    Procedure: INSERTION OF DIALYSIS CATHETER USING 19cm PALINDROME CHRONIC CATHETER;  Surgeon: Sheree Penne Bruckner, MD;  Location: [facility] OR;  Service: Vascular;  Laterality: Right;   INSERTION OF DIALYSIS CATHETER Right 10/28/2022   Procedure: INSERTION OF RIGHT INTERNAL JUGULAR TUNNELED DIALYSIS CATHETER;  Surgeon: Lanis Fonda BRAVO, MD;  Location: [facility] OR;  Service: Vascular;  Laterality: Right;   REVISON OF ARTERIOVENOUS FISTULA Right 12/06/2020   Procedure: REVISON OF RIGHT ARM FISTULA;  Surgeon: Oris Krystal FALCON, MD;  Location: [facility] OR;  Service: Vascular;  Laterality: Right;   TOTAL HIP ARTHROPLASTY Left     Short Social History:  Social History   Tobacco Use   Smoking status: Never    Passive exposure: Never   Smokeless tobacco: Never  Substance Use Topics   Alcohol use: Never    Allergies  Allergen Reactions   Iron Dextran Shortness Of Breath    Infed Iron Infusion    Darvon [Propoxyphene] Other (See Comments)    Lungs filling up with fluid (reaction to Darvon/Darvocet)     Lactose Other (See Comments)    Gi upset/ sugar in milk   Milk (Cow) Other (See Comments)    Gi upset    Current Outpatient Medications  Medication Sig Dispense Refill   acetaminophen  (TYLENOL ) 650 MG CR tablet Take 325-650 mg by mouth See admin instructions. Take 650 mg in the morning and 325  mg in the evening     brimonidine  (ALPHAGAN ) 0.2 % ophthalmic solution Place 1 drop into both eyes 2 (two) times daily.     calcium elemental as carbonate (TUMS ULTRA 1000) 400 MG chewable tablet Chew 1,000 mg by mouth daily as needed for heartburn.     cinacalcet  (SENSIPAR ) 60 MG tablet Take 60 mg by mouth daily with supper.     Docusate Sodium  (DSS) 100 MG CAPS Take 100 mg by mouth 2 (two) times daily.     dorzolamide -timolol  (COSOPT ) 2-0.5 % ophthalmic solution Place 1 drop into both eyes 2 (two) times daily.     fexofenadine (ALLEGRA) 180 MG tablet Take 180 mg by mouth daily.     Lactase (LACTOSE INTOLERANCE PO)  Take 4 oz by mouth daily as needed (Dairy).     multivitamin (RENA-VIT) TABS tablet Take 1 tablet by mouth daily.     ondansetron  (ZOFRAN ) 4 MG tablet Take 1 tablet (4 mg total) by mouth every 8 (eight) hours as needed for nausea or vomiting. (Patient not taking: Reported on 09/22/2024) 20 tablet 0   ROCKLATAN 0.02-0.005 % SOLN Apply 1 drop to eye at bedtime.     sevelamer  (RENAGEL ) 800 MG tablet Take 2,400 mg by mouth 3 (three) times daily with meals. Take 800 mg with snack     sevelamer  carbonate (RENVELA ) 800 MG tablet 4 tablets.     No current facility-administered medications for this visit.    Review of Systems  Constitutional:  Constitutional negative. HENT: HENT negative.  Eyes: Eyes negative.  Cardiovascular: Cardiovascular negative.  GI: Gastrointestinal negative.  Musculoskeletal: Musculoskeletal negative.  Skin: Positive for wound.  Neurological: Neurological negative. Hematologic: Hematologic/lymphatic negative.  Psychiatric: Psychiatric negative.        Objective:  Objective   Vitals:   10/06/24 1333  BP: 134/83  Pulse: (!) 55  Temp: 97.9 F (36.6 C)  SpO2: 90%     Physical Exam HENT:     Head: Normocephalic.     Nose: Nose normal.  Cardiovascular:     Rate and Rhythm: Normal rate.  Pulmonary:     Effort: Pulmonary effort is normal.  Abdominal:     General: Abdomen is flat.  Musculoskeletal:        General: Normal range of motion.     Cervical back: Normal range of motion.     Comments: Fistula with mild pulsatility and scab as pictured below  Skin:    General: Skin is warm.     Capillary Refill: Capillary refill takes less than 2 seconds.  Neurological:     Mental Status: He is alert.            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  We have discussed the need for revision of  this ulcerated area to prevent bleeding.  He will likely require catheter at that time until the fistula heals.  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  We discussed catheter could possibly be placed back in the right IJ more than likely will require femoral catheter and he demonstrates good understanding.    Plan for right arm fistula revision and tunneled  dialysis catheter on a nondialysis day in the near future.     Penne Lonni [place] MD [facility]

## 2024-10-07 DIAGNOSIS — Z992 Dependence on renal dialysis: Secondary | ICD-10-CM | POA: Diagnosis not present

## 2024-10-07 DIAGNOSIS — N186 End stage renal disease: Secondary | ICD-10-CM | POA: Diagnosis not present

## 2024-10-09 DIAGNOSIS — N186 End stage renal disease: Secondary | ICD-10-CM | POA: Diagnosis not present

## 2024-10-11 ENCOUNTER — Encounter (HOSPITAL_COMMUNITY): Payer: Self-pay | Admitting: Vascular Surgery

## 2024-10-11 ENCOUNTER — Other Ambulatory Visit: Payer: Self-pay

## 2024-10-11 NOTE — Progress Notes (Addendum)
 Anesthesia Chart Review: SAME DAY WORK-UP  Case: [id number] Date/Time: 10/12/24 1246   Procedures:      REVISON OF ARTERIOVENOUS FISTULA (Right)     INSERTION OF DIALYSIS CATHETER   Anesthesia type: Choice   Diagnosis: ESRD (end stage renal disease) (HCC) [N18.6]   Pre-op diagnosis: ESRD   [facility] ROOM 16 / [facility]   Surgeons: Sheree Penne Bruckner, MD       DISCUSSION: Patient is a 68 year old male scheduled for the above procedure.  He has an aneurysmal right arm AV fistula revision recommended over ulcerated area to prevent bleeding.  It is anticipated he will likely require a dialysis catheter until the fistula site heals.  He has evidence of severe central venous stenosis with multiple collateral veins to his neck and chest so may require femoral catheter if one needs to be placed.  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  He has known thyromegaly that is followed by endocrinologist Dr. Dale. 12/07/2022 CT showed enlarged left thyroid lobe extending to the superior mediastinum with tracheal within normal limits. 10/28/2022 RUE AVF creation was done using LMA for airway.  FNA of left thyroid nodule in 01/2023 showed follicular lesion of undetermined significance, Bethesda category III. Most recent chest and neck imaging in 12/2023 showed enlarged and heterogeneous thyroid gland, L > R, left measured 7.3 cm x 3.1 cm x 5.0 cm which could represent a benign goiter but thyroid US  recommended  for further evaluation. He had follow-up with Dr. Dale on 04/05/2024. He reviewed ultrasounds.  There had been 20% growth since FNA in 2024 (but stable on 05/07/2024 US ). No dysphagia or dysphonia. He wrote, The left nodule is mostly solid but does contain some cystic components in the form of numerous small cysts within the nodule. However I believe it is isoechoic and not hypoechoic as the ultrasound would suggest. Additionally, I do not consider the borders lobulated or irregular. We will repeat an ultrasound at this time given the growth on his prior ultrasound. If there is continued growth we will repeat an FNA. However given his multiple comorbidities, we will try to avoid surgery for this nodule if possible. One year follow-up planned.   Per intubation record 12/12/2022 (Care Everywhere):  Patient position: sniffingRapid sequence induction: NoNoMask difficulty assessment: 1 - easy ventilation by mask Endotracheal tube type: ETT  Successful intubation technique: VL - Glidescope Facilitating devices/methods: anterior pressure/BURP and intubating stylet Endotracheal tube insertion site: oral Blade: Macintosh Blade size: #3 ETT size: 7.0 mm Cuffed: yes View (Cormack Lehane grade): grade I - visualization of entire laryngeal aperture Number of attempts at approach: 1  He also had HEM-ONC evaluation 12/23/2022 for amyloidosis, positive congo red stain on left hip bone biopsies following THA for pathologic fracture. Imaging was suspicious for systemic disease given his diffuse bone and soft tissue lesions. Management of systemic amyloidosis is dependent on the particular protein driving the process. Per consult notes, Considerations for AL amyloidosis (light chain deposition), the most common variety and treated like a plasma  cell dyscrasia. His SPEP and IFX are less suggestive of this given the absence of a monoclonal protein. Given ESRD also consideration for dialysis related amyloidosis given his  extensive history of hemodialysis, this is predominantly managed by adjustment of RRT, though would defer to nephrology on this. Consideration for AA amyloid, which most often involves the kidney but can have varying manifestations including cardiac involvement. ATTR amyloid is another consideration, of relevance as he carries a diagnosis of neuropathy of an unknown cause and is frequently seen in ATTR amyloidosis. There are spesific therapies for ATTR amyloid and given the proclivity for cardiac involvement this is often a multispecialty disease with extensive input from cardiology. This is also a hereditary disease that would be of potential significance to any blood relatives. Additional testing planned to guide management. Additional lab testing was apparently sent to [facility] and then scanned into [facility] (which is not viewable through Care Everywhere).  It's unclear who is currently managing his amyloidosis, or if primary process or strain identified.     Anesthesia team to evaluate on the day of surgery. Chest and neck images from January are in our system if further review desired on the day of surgery.  VS:  Wt Readings from Last 3 Encounters:  07/28/24 57.6 kg  02/27/24 58 kg  01/28/24 57.2 kg   BP Readings from Last 3 Encounters:  10/06/24 134/83  09/01/24 (!) 152/86  08/18/24 103/60   Pulse Readings from Last 3 Encounters:  10/06/24 (!) 55  09/01/24 61  08/18/24 (!) 58     PROVIDERS: Catalina Bare, MD is PCP  Dale Cough, MD is endocrinologist Leigh Ditch, MD is neurologist   LABS: For day of surgery. HGB 11.2 on 10/07/2024.    IMAGES: US  Thyroid 05/07/2024: FINDINGS:  . Thyroid: The background parenchyma is normal in echotexture.  .  The right lobe measures 2.0 x 2.4 x 5.4 cm.  .  The left lobe measures 4.2 x 3.2 x 7.8 cm.  .  The isthmus measures 0.7 cm.  .  Vascular flow: Normal.  .  Soft tissues of neck: No enlarged nodes or masses.  .  Nodules:    .  4.7 x 2.9 x 6.9 cm nodule in the left hemithyroid is solid (2) and hypoechoic (2). The lesion is taller-than-wide (3) in shape with smooth margins (0). , macrocalcifications (1) are noted. Total = 8 points, TI-RADS 5. No significant interval change in size. No change in TR-level.   IMPRESSION: No significant interval change in the left thyroid nodule.    CT Chest 12/24/2023: IMPRESSION: Extensive chest wall collaterals identified with evidence of the central venous occlusion. Atretic appearance to the brachiocephalic veins. No opacification of the structures. There is reconstitution via collaterals and opacification of the SVC seen at this time. - Please see separate dictation of neck CT scan - Aortic Atherosclerosis (ICD10-I70.0).   CT Soft Tissue Neck 12/24/2023: IMPRESSION: 1. Enlarged and heterogeneous thyroid gland, with much more enlargement on the left than the right. On the left, the lobe measures 7.3 cm x 3.1 cm x 5.0 cm. This may represent benign goiter, but thyroid ultrasound is recommended for specific evaluation. 2. Innumerable vascular collaterals throughout the region, likely because of chronic central veno occlusive disease. This is presumably chronic and long-standing. 3. Chronic degenerative changes of the C3-4 and C4-5 levels with some irregularity of the endplates, often seen in patients with chronic dialysis. There are no surrounding inflammatory changes to suggest spinal infection based  on this study. 4. Bilateral mastoid effusions. No posterior nasopharyngeal lesion identified. S/p left thyroid nodule FNA 02/03/2023 ([facility]): Follicular lesion of undetermined significance (Bethesda category III).    EKG: For day of surgery as indicated.  Last tracing 06/14/2023 showed: Normal sinus rhythm Nonspecific ST abnormality   CV: N/A  Past Medical History:  Diagnosis Date   Amyloidosis (HCC) 12/12/2022   pathologic left femoral neck fracture, s/p THA with  biopsies + amyloidosis   Cataract    Complication of anesthesia    Awakens during surgery   Dyspnea    Enlarged thyroid gland    left > right, extending into superior mediastinum by 12/07/2022 CT (followed by endocrinologist Dr. Dale)   ESRD (end stage renal disease) (HCC)    since 68; T[facility]SAT, [address]   Glaucoma    Hypertensive chronic kidney disease    Hypotension    Neuropathy    hands - per patient   Peripheral vascular disease     Past Surgical History:  Procedure Laterality Date   A/V FISTULAGRAM N/A 02/27/2024   Procedure: A/V Fistulagram;  Surgeon: Norine Manuelita LABOR, MD;  Location: [facility] INVASIVE CV LAB;  Service: Cardiovascular;  Laterality: N/A;   AMPUTATION Right 06/02/2020   Procedure: RIGHT FOOT 5TH RAY AMPUTATION;  Surgeon: Harden Jerona GAILS, MD;  Location: [facility] OR;  Service: Orthopedics;  Laterality: Right;   AV FISTULA PLACEMENT     BIOPSY THYROID     EYE SURGERY     cataract bilateral   EYE SURGERY Right    FISTULA SUPERFICIALIZATION Right 10/28/2022   Procedure: RIGHT ARM ARTERIOVENOUS FISTULA REVISION WITH COMPLEX SKIN AND SOFT TISSUE CLOSURE;  Surgeon: Lanis Fonda BRAVO, MD;  Location: [facility] OR;  Service: Vascular;  Laterality: Right;   INSERTION OF DIALYSIS CATHETER Right 12/08/2020   Procedure: INSERTION OF DIALYSIS CATHETER USING 19cm PALINDROME CHRONIC CATHETER;  Surgeon: Sheree Penne Bruckner, MD;  Location: [facility] OR;  Service: Vascular;  Laterality: Right;   INSERTION OF DIALYSIS CATHETER Right 10/28/2022   Procedure: INSERTION OF RIGHT INTERNAL JUGULAR TUNNELED DIALYSIS CATHETER;  Surgeon: Lanis Fonda BRAVO, MD;  Location: [facility] OR;  Service: Vascular;  Laterality: Right;   REVISON OF ARTERIOVENOUS FISTULA Right 12/06/2020   Procedure: REVISON OF RIGHT ARM FISTULA;  Surgeon: Oris Krystal FALCON, MD;  Location: [facility] OR;  Service: Vascular;  Laterality: Right;   TOE AMPUTATION Left    big toe on left foot   TOTAL HIP ARTHROPLASTY Left     MEDICATIONS: No current  facility-administered medications for this encounter.    acetaminophen  (TYLENOL ) 650 MG CR tablet   brimonidine  (ALPHAGAN ) 0.2 % ophthalmic solution   calcium elemental as carbonate (TUMS ULTRA 1000) 400 MG chewable tablet   cinacalcet  (SENSIPAR ) 60 MG tablet   dorzolamide -timolol  (COSOPT ) 2-0.5 % ophthalmic solution   fexofenadine (ALLEGRA) 180 MG tablet   multivitamin (RENA-VIT) TABS tablet   ROCKLATAN 0.02-0.005 % SOLN   sevelamer  carbonate (RENVELA ) 800 MG tablet    Isaiah Ruder, PA-C Surgical Short Stay/Anesthesiology [facility] Phone [phone] [facility] Phone [phone] 10/11/2024 3:39 PM

## 2024-10-11 NOTE — Progress Notes (Addendum)
 SDW CALL  Patient was given pre-op instructions over the phone. The opportunity was given for the patient to ask questions. No further questions asked. Patient verbalized understanding of instructions given.   PCP - Zachary Berg Bunsu Cardiologist - denies  PPM/ICD - denies  Chest x-ray - denies EKG - 06/2023 Stress Test - denies ECHO - denies Cardiac Cath - denies  Sleep Study - denies  No DM.   Last dose of GLP1 agonist-  n/a GLP1 instructions: n/a  Blood Thinner Instructions:  n/a Aspirin Instructions: n/a  ERAS Protcol - NPO PRE-SURGERY Ensure or G2- n/a  COVID TEST- n/a   Anesthesia review: yes - airway (enlarged thyroid)  Patient denies shortness of breath, fever, cough and chest pain over the phone call   All instructions explained to the patient, with a verbal understanding of the material. Patient agrees to go over the instructions while at home for a better understanding.

## 2024-10-11 NOTE — Anesthesia Preprocedure Evaluation (Signed)
 Anesthesia Evaluation  Patient identified by MRN, date of birth, ID band Patient awake    Reviewed: Allergy & Precautions, NPO status , Patient's Chart, lab work & pertinent test results, reviewed documented beta blocker date and time   History of Anesthesia Complications (+) AWARENESS UNDER ANESTHESIA and history of anesthetic complications  Airway Mallampati: II  TM Distance: >3 FB     Dental no notable dental hx.    Pulmonary shortness of breath and with exertion   breath sounds clear to auscultation       Cardiovascular hypertension, + Peripheral Vascular Disease   Rhythm:Regular Rate:Normal     Neuro/Psych  Headaches, neg Seizures  Neuromuscular disease    GI/Hepatic   Endo/Other    Renal/GU Dialysis and ESRFRenal disease     Musculoskeletal   Abdominal   Peds  Hematology  (+) Blood dyscrasia, anemia   Anesthesia Other Findings   Reproductive/Obstetrics                              Anesthesia Physical Anesthesia Plan  ASA: 3  Anesthesia Plan: General   Post-op Pain Management:    Induction: Intravenous  PONV Risk Score and Plan: 2 and Ondansetron  and Dexamethasone   Airway Management Planned: LMA  Additional Equipment:   Intra-op Plan:   Post-operative Plan: Extubation in OR  Informed Consent: I have reviewed the patients History and Physical, chart, labs and discussed the procedure including the risks, benefits and alternatives for the proposed anesthesia with the patient or authorized representative who has indicated his/her understanding and acceptance.     Dental advisory given  Plan Discussed with: CRNA  Anesthesia Plan Comments: (See PAT note written 10/11/2024 by Isaiah Ruder, PA-C. 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 Latest thyroid imaging include CT neck and chest 12/24/2023 and US  (04/2024 in Care Everywhere).   CT Soft Tissue Neck 12/24/2023: IMPRESSION: 1. Enlarged and heterogeneous thyroid gland, with much more enlargement on the left than the right. On the left, the lobe measures 7.3 cm x 3.1 cm x 5.0 cm. This may represent benign goiter, but thyroid ultrasound is recommended for specific evaluation. 2. Innumerable vascular collaterals throughout the region, likely because of chronic central veno occlusive disease. This is presumably chronic and long-standing. 3. Chronic degenerative changes of the C3-4 and C4-5 levels with some irregularity of the endplates, often seen in patients with chronic dialysis. There are no surrounding inflammatory changes to suggest spinal infection based on this study. 4. Bilateral mastoid effusions. No posterior nasopharyngeal lesion identified. )         Anesthesia Quick Evaluation

## 2024-10-12 ENCOUNTER — Encounter (HOSPITAL_COMMUNITY)
Admission: RE | Disposition: A | Discharge status: Home / Self Care | Payer: Self-pay | Source: Home / Self Care | Attending: Vascular Surgery

## 2024-10-12 ENCOUNTER — Ambulatory Visit (HOSPITAL_COMMUNITY)

## 2024-10-12 ENCOUNTER — Ambulatory Visit (HOSPITAL_COMMUNITY): Payer: Self-pay | Admitting: Vascular Surgery

## 2024-10-12 ENCOUNTER — Other Ambulatory Visit (HOSPITAL_COMMUNITY): Payer: Self-pay

## 2024-10-12 ENCOUNTER — Ambulatory Visit (HOSPITAL_COMMUNITY)
Admission: RE | Admit: 2024-10-12 | Discharge: 2024-10-12 | Disposition: A | Discharge status: Home / Self Care | Attending: Vascular Surgery | Admitting: Vascular Surgery

## 2024-10-12 ENCOUNTER — Encounter (HOSPITAL_COMMUNITY): Payer: Self-pay | Admitting: Vascular Surgery

## 2024-10-12 DIAGNOSIS — T82868A Thrombosis of vascular prosthetic devices, implants and grafts, initial encounter: Secondary | ICD-10-CM | POA: Insufficient documentation

## 2024-10-12 DIAGNOSIS — I739 Peripheral vascular disease, unspecified: Secondary | ICD-10-CM | POA: Diagnosis not present

## 2024-10-12 DIAGNOSIS — T82510A Breakdown (mechanical) of surgically created arteriovenous fistula, initial encounter: Secondary | ICD-10-CM | POA: Diagnosis not present

## 2024-10-12 DIAGNOSIS — Z992 Dependence on renal dialysis: Secondary | ICD-10-CM | POA: Diagnosis not present

## 2024-10-12 DIAGNOSIS — D631 Anemia in chronic kidney disease: Secondary | ICD-10-CM | POA: Diagnosis not present

## 2024-10-12 DIAGNOSIS — Y832 Surgical operation with anastomosis, bypass or graft as the cause of abnormal reaction of the patient, or of later complication, without mention of misadventure at the time of the procedure: Secondary | ICD-10-CM | POA: Diagnosis not present

## 2024-10-12 DIAGNOSIS — Z89412 Acquired absence of left great toe: Secondary | ICD-10-CM | POA: Insufficient documentation

## 2024-10-12 DIAGNOSIS — T82898A Other specified complication of vascular prosthetic devices, implants and grafts, initial encounter: Secondary | ICD-10-CM | POA: Diagnosis not present

## 2024-10-12 DIAGNOSIS — I871 Compression of vein: Secondary | ICD-10-CM | POA: Insufficient documentation

## 2024-10-12 DIAGNOSIS — N186 End stage renal disease: Secondary | ICD-10-CM

## 2024-10-12 DIAGNOSIS — I120 Hypertensive chronic kidney disease with stage 5 chronic kidney disease or end stage renal disease: Secondary | ICD-10-CM | POA: Diagnosis not present

## 2024-10-12 HISTORY — DX: Nontoxic goiter, unspecified: E04.9

## 2024-10-12 HISTORY — DX: Hypotension, unspecified: I95.9

## 2024-10-12 HISTORY — DX: Essential (primary) hypertension: I10

## 2024-10-12 HISTORY — DX: Hypertensive chronic kidney disease with stage 1 through stage 4 chronic kidney disease, or unspecified chronic kidney disease: I12.9

## 2024-10-12 LAB — POCT I-STAT, CHEM 8
BUN: 40 mg/dL — ABNORMAL HIGH (ref 8–23)
Calcium, Ion: 0.96 mmol/L — ABNORMAL LOW (ref 1.15–1.40)
Chloride: 100 mmol/L (ref 98–111)
Creatinine, Ser: 12.7 mg/dL — ABNORMAL HIGH (ref 0.61–1.24)
Glucose, Bld: 88 mg/dL (ref 70–99)
HCT: 34 % — ABNORMAL LOW (ref 39.0–52.0)
Hemoglobin: 11.6 g/dL — ABNORMAL LOW (ref 13.0–17.0)
Potassium: 5.4 mmol/L — ABNORMAL HIGH (ref 3.5–5.1)
Sodium: 137 mmol/L (ref 135–145)
TCO2: 27 mmol/L (ref 22–32)

## 2024-10-12 SURGERY — REVISON OF ARTERIOVENOUS FISTULA
Anesthesia: General | Site: Groin | Laterality: Right

## 2024-10-12 MED ORDER — OXYCODONE-ACETAMINOPHEN 5-325 MG PO TABS
1.0000 | ORAL_TABLET | Freq: Four times a day (QID) | ORAL | 0 refills | Status: AC | PRN
  Filled 2024-10-12: qty 12, 3d supply, fill #0

## 2024-10-12 MED ORDER — OXYCODONE HCL 5 MG PO TABS
ORAL_TABLET | ORAL | Status: AC
  Filled 2024-10-12: qty 1

## 2024-10-12 MED ORDER — LIDOCAINE 2% (20 MG/ML) 5 ML SYRINGE
INTRAMUSCULAR | Status: DC | PRN
  Administered 2024-10-12: 100 mg via INTRAVENOUS

## 2024-10-12 MED ORDER — CEFAZOLIN SODIUM-DEXTROSE 2-4 GM/100ML-% IV SOLN
2.0000 g | INTRAVENOUS | Status: AC
  Administered 2024-10-12: 2 g via INTRAVENOUS
  Filled 2024-10-12: qty 100

## 2024-10-12 MED ORDER — CHLORHEXIDINE GLUCONATE 4 % EX SOLN: 60.0000 mL | Freq: Once | CUTANEOUS | Status: DC

## 2024-10-12 MED ORDER — OXYCODONE-ACETAMINOPHEN 5-325 MG PO TABS: 1.0000 | ORAL_TABLET | Freq: Four times a day (QID) | ORAL | 0 refills | Status: DC | PRN

## 2024-10-12 MED ORDER — FENTANYL CITRATE (PF) 100 MCG/2ML IJ SOLN
25.0000 ug | INTRAMUSCULAR | Status: DC | PRN
  Administered 2024-10-12 (×2): 25 ug via INTRAVENOUS

## 2024-10-12 MED ORDER — LIDOCAINE 2% (20 MG/ML) 5 ML SYRINGE
INTRAMUSCULAR | Status: AC
  Filled 2024-10-12: qty 5

## 2024-10-12 MED ORDER — PHENYLEPHRINE HCL-NACL 20-0.9 MG/250ML-% IV SOLN
INTRAVENOUS | Status: DC | PRN
  Administered 2024-10-12: 50 ug/min via INTRAVENOUS

## 2024-10-12 MED ORDER — PROPOFOL 10 MG/ML IV BOLUS
INTRAVENOUS | Status: AC
Start: 2024-10-12 — End: 2024-10-12
  Filled 2024-10-12: qty 20

## 2024-10-12 MED ORDER — OXYCODONE HCL 5 MG PO TABS
5.0000 mg | ORAL_TABLET | Freq: Once | ORAL | Status: AC | PRN
  Administered 2024-10-12: 5 mg via ORAL

## 2024-10-12 MED ORDER — HEPARIN 6000 UNIT IRRIGATION SOLUTION
Status: DC | PRN
  Administered 2024-10-12: 1

## 2024-10-12 MED ORDER — HEPARIN 6000 UNIT IRRIGATION SOLUTION
Status: AC
  Filled 2024-10-12: qty 500

## 2024-10-12 MED ORDER — LACTATED RINGERS IV SOLN: INTRAVENOUS | Status: DC

## 2024-10-12 MED ORDER — FENTANYL CITRATE (PF) 100 MCG/2ML IJ SOLN
INTRAMUSCULAR | Status: AC
  Filled 2024-10-12: qty 2

## 2024-10-12 MED ORDER — PROPOFOL 1000 MG/100ML IV EMUL
INTRAVENOUS | Status: AC
  Filled 2024-10-12: qty 100

## 2024-10-12 MED ORDER — SODIUM CHLORIDE 0.9 % IV SOLN: INTRAVENOUS | Status: DC

## 2024-10-12 MED ORDER — LIDOCAINE HCL (CARDIAC) PF 100 MG/5ML IV SOSY
PREFILLED_SYRINGE | INTRAVENOUS | Status: DC | PRN
  Administered 2024-10-12: 100 mg via INTRAVENOUS

## 2024-10-12 MED ORDER — MIDAZOLAM HCL (PF) 2 MG/2ML IJ SOLN
INTRAMUSCULAR | Status: DC | PRN
  Administered 2024-10-12: 2 mg via INTRAVENOUS

## 2024-10-12 MED ORDER — ORAL CARE MOUTH RINSE: 15.0000 mL | Freq: Once | OROMUCOSAL | Status: AC

## 2024-10-12 MED ORDER — PHENYLEPHRINE 80 MCG/ML (10ML) SYRINGE FOR IV PUSH (FOR BLOOD PRESSURE SUPPORT)
PREFILLED_SYRINGE | INTRAVENOUS | Status: DC | PRN
  Administered 2024-10-12: 80 ug via INTRAVENOUS
  Administered 2024-10-12: 160 ug via INTRAVENOUS

## 2024-10-12 MED ORDER — CHLORHEXIDINE GLUCONATE 0.12 % MT SOLN
15.0000 mL | Freq: Once | OROMUCOSAL | Status: AC
  Administered 2024-10-12: 15 mL via OROMUCOSAL

## 2024-10-12 MED ORDER — PROPOFOL 10 MG/ML IV BOLUS
INTRAVENOUS | Status: DC | PRN
  Administered 2024-10-12: 50 mg via INTRAVENOUS
  Administered 2024-10-12: 150 mg via INTRAVENOUS

## 2024-10-12 MED ORDER — ONDANSETRON HCL 4 MG/2ML IJ SOLN
INTRAMUSCULAR | Status: DC | PRN
  Administered 2024-10-12: 4 mg via INTRAVENOUS

## 2024-10-12 MED ORDER — FENTANYL CITRATE (PF) 250 MCG/5ML IJ SOLN
INTRAMUSCULAR | Status: DC | PRN
  Administered 2024-10-12: 25 ug via INTRAVENOUS

## 2024-10-12 MED ORDER — MIDAZOLAM HCL 2 MG/2ML IJ SOLN
INTRAMUSCULAR | Status: AC
Start: 2024-10-12 — End: 2024-10-12
  Filled 2024-10-12: qty 2

## 2024-10-12 MED ORDER — OXYCODONE HCL 5 MG/5ML PO SOLN: 5.0000 mg | Freq: Once | ORAL | Status: AC | PRN

## 2024-10-12 MED ORDER — ACETAMINOPHEN 10 MG/ML IV SOLN: 1000.0000 mg | Freq: Once | INTRAVENOUS | Status: DC | PRN

## 2024-10-12 MED ORDER — HEPARIN SODIUM (PORCINE) 1000 UNIT/ML IJ SOLN
INTRAMUSCULAR | Status: AC
Start: 2024-10-12 — End: 2024-10-12
  Filled 2024-10-12: qty 10

## 2024-10-12 MED ORDER — ONDANSETRON HCL 4 MG/2ML IJ SOLN
INTRAMUSCULAR | Status: AC
  Filled 2024-10-12: qty 2

## 2024-10-12 MED ORDER — HEPARIN SODIUM (PORCINE) 1000 UNIT/ML IJ SOLN
INTRAMUSCULAR | Status: DC | PRN
  Administered 2024-10-12: 4800 [IU]

## 2024-10-12 MED ORDER — ONDANSETRON HCL 4 MG/2ML IJ SOLN: 4.0000 mg | Freq: Once | INTRAMUSCULAR | Status: DC | PRN

## 2024-10-12 MED ORDER — DEXAMETHASONE SOD PHOSPHATE PF 10 MG/ML IJ SOLN
INTRAMUSCULAR | Status: DC | PRN
  Administered 2024-10-12: 5 mg via INTRAVENOUS

## 2024-10-12 SURGICAL SUPPLY — 50 items
ARMBAND PINK RESTRICT EXTREMIT (MISCELLANEOUS) ×2 IMPLANT
BAG COUNTER SPONGE SURGICOUNT (BAG) ×2 IMPLANT
BAG DECANTER FOR FLEXI CONT (MISCELLANEOUS) ×2 IMPLANT
BIOPATCH RED 1 DISK 7.0 (GAUZE/BANDAGES/DRESSINGS) ×2 IMPLANT
BNDG COMPR ESMARK 4X3 LF (GAUZE/BANDAGES/DRESSINGS) IMPLANT
BNDG ELASTIC 4INX 5YD STR LF (GAUZE/BANDAGES/DRESSINGS) IMPLANT
CANISTER SUCTION 3000ML PPV (SUCTIONS) ×2 IMPLANT
CATH PALINDROME-P 19CM W/VT (CATHETERS) IMPLANT
CATH PALINDROME-P 28CM W/VT (CATHETERS) IMPLANT
CLIP LIGATING EXTRA MED SLVR (CLIP) ×2 IMPLANT
CLIP LIGATING EXTRA SM BLUE (MISCELLANEOUS) ×2 IMPLANT
COVER DOME SNAP 22 D (MISCELLANEOUS) IMPLANT
COVER PROBE W GEL 5X96 (DRAPES) ×2 IMPLANT
COVER SURGICAL LIGHT HANDLE (MISCELLANEOUS) ×2 IMPLANT
CUFF TOURN SGL QUICK 18X4 (TOURNIQUET CUFF) IMPLANT
DERMABOND ADVANCED .7 DNX12 (GAUZE/BANDAGES/DRESSINGS) ×2 IMPLANT
DRAPE LAPAROSCOPIC ABDOMINAL (DRAPES) IMPLANT
ELECTRODE REM PT RTRN 9FT ADLT (ELECTROSURGICAL) ×2 IMPLANT
GAUZE 4X4 16PLY ~~LOC~~+RFID DBL (SPONGE) ×2 IMPLANT
GAUZE SPONGE 4X4 12PLY STRL (GAUZE/BANDAGES/DRESSINGS) IMPLANT
GLOVE BIO SURGEON STRL SZ7.5 (GLOVE) ×2 IMPLANT
GOWN STRL REUS W/ TWL LRG LVL3 (GOWN DISPOSABLE) ×4 IMPLANT
GOWN STRL REUS W/ TWL XL LVL3 (GOWN DISPOSABLE) ×2 IMPLANT
KIT BASIN OR (CUSTOM PROCEDURE TRAY) ×2 IMPLANT
KIT CATH CHRNC PALINDROME 14.5 (CATHETERS) IMPLANT
KIT PALINDROME-P 55CM (CATHETERS) IMPLANT
KIT TURNOVER KIT B (KITS) ×2 IMPLANT
NDL 18GX1X1/2 (RX/OR ONLY) (NEEDLE) ×2 IMPLANT
NDL HYPO 25GX1X1/2 BEV (NEEDLE) ×2 IMPLANT
NEEDLE 18GX1X1/2 (RX/OR ONLY) (NEEDLE) ×2 IMPLANT
NEEDLE HYPO 25GX1X1/2 BEV (NEEDLE) ×2 IMPLANT
PACK CV ACCESS (CUSTOM PROCEDURE TRAY) ×2 IMPLANT
PAD ARMBOARD POSITIONER FOAM (MISCELLANEOUS) ×4 IMPLANT
POWDER SURGICEL 3.0 GRAM (HEMOSTASIS) IMPLANT
SET MICROPUNCTURE 5F STIFF (MISCELLANEOUS) IMPLANT
SLING ARM FOAM STRAP LRG (SOFTGOODS) IMPLANT
SOAP 2 % CHG 4 OZ (WOUND CARE) ×2 IMPLANT
SOLN 0.9% NACL POUR BTL 1000ML (IV SOLUTION) ×2 IMPLANT
SOLN STERILE WATER BTL 1000 ML (IV SOLUTION) ×2 IMPLANT
STAPLER SKIN PROX 35W (STAPLE) IMPLANT
SUT ETHILON 3 0 PS 1 (SUTURE) ×2 IMPLANT
SUT MNCRL AB 4-0 PS2 18 (SUTURE) ×2 IMPLANT
SUT PROLENE 5 0 C 1 24 (SUTURE) IMPLANT
SUT PROLENE 6 0 BV (SUTURE) ×2 IMPLANT
SUT VIC AB 3-0 SH 27X BRD (SUTURE) ×2 IMPLANT
SYR 20ML LL LF (SYRINGE) ×4 IMPLANT
SYR 5ML LL (SYRINGE) ×2 IMPLANT
TOWEL GREEN STERILE (TOWEL DISPOSABLE) ×2 IMPLANT
TOWEL GREEN STERILE FF (TOWEL DISPOSABLE) ×4 IMPLANT
UNDERPAD 30X36 HEAVY ABSORB (UNDERPADS AND DIAPERS) ×2 IMPLANT

## 2024-10-12 NOTE — Interval H&P Note (Signed)
 History and Physical Interval Note:  10/12/2024 1:12 PM  Timothy Ponce  has presented today for surgery, with the diagnosis of ESRD.  The various methods of treatment have been discussed with the patient and family. After consideration of risks, benefits and other options for treatment, the patient has consented to  Procedure(s): REVISON OF ARTERIOVENOUS FISTULA (Right) INSERTION OF DIALYSIS CATHETER (N/A) as a surgical intervention.  The patient's history has been reviewed, patient examined, no change in status, stable for surgery.  I have reviewed the patient's chart and labs.  Questions were answered to the patient's satisfaction.     [name]

## 2024-10-12 NOTE — Interval H&P Note (Signed)
 History and Physical Interval Note:  10/12/2024 11:17 AM  Timothy Ponce  has presented today for surgery, with the diagnosis of ESRD.  The various methods of treatment have been discussed with the patient and family. After consideration of risks, benefits and other options for treatment, the patient has consented to  Procedure(s): REVISON OF ARTERIOVENOUS FISTULA (Right) INSERTION OF DIALYSIS CATHETER (N/A) as a surgical intervention.  The patient's history has been reviewed, patient examined, no change in status, stable for surgery.  I have reviewed the patient's chart and labs.  Questions were answered to the patient's satisfaction.     [name]

## 2024-10-12 NOTE — Discharge Instructions (Signed)
 [facility]  Discharge Instructions  AV Fistula or Graft Surgery for Dialysis Access  Please refer to the following instructions for your post-procedure care. Your surgeon or physician assistant will discuss any changes with you.  Activity  You may drive the day following your surgery, if you are comfortable and no longer taking prescription pain medication. Resume full activity as the soreness in your incision resolves.  Bathing/Showering  You may shower after you go home. Keep your incision dry for 48 hours. Do not soak in a bathtub, hot tub, or swim until the incision heals completely. You may not shower if you have a hemodialysis catheter.  Incision Care  Clean your incision with mild soap and water after 48 hours. Pat the area dry with a clean towel. You do not need a bandage unless otherwise instructed. Do not apply any ointments or creams to your incision. You may have skin glue on your incision. Do not peel it off. It will come off on its own in about one week. Your arm may swell a bit after surgery. To reduce swelling use pillows to elevate your arm so it is above your heart. Your doctor will tell you if you need to lightly wrap your arm with an ACE bandage.  Diet  Resume your normal diet. There are not special food restrictions following this procedure. In order to heal from your surgery, it is CRITICAL to get adequate nutrition. Your body requires vitamins, minerals, and protein. Vegetables are the best source of vitamins and minerals. Vegetables also provide the perfect balance of protein. Processed food has little nutritional value, so try to avoid this.  Medications  Resume taking all of your medications. If your incision is causing pain, you may take over-the counter pain relievers such as acetaminophen  (Tylenol ). If you were prescribed a stronger pain medication, please be aware these medications can cause nausea and constipation. Prevent  nausea by taking the medication with a snack or meal. Avoid constipation by drinking plenty of fluids and eating foods with high amount of fiber, such as fruits, vegetables, and grains.  Do not take Tylenol  if you are taking prescription pain medications.  Follow up Your surgeon may want to see you in the office following your access surgery. If so, this will be arranged at the time of your surgery.  Please call us  immediately for any of the following conditions:  Increased pain, redness, drainage (pus) from your incision site Fever of 101 degrees or higher Severe or worsening pain at your incision site Hand pain or numbness.  Reduce your risk of vascular disease:  Stop smoking. If you would like help, call [facility] at 1-800-QUIT-NOW (639-160-2670) or [place] at [phone]  Manage your cholesterol Maintain a desired weight Control your diabetes Keep your blood pressure down  Dialysis  It will take several weeks to several months for your new dialysis access to be ready for use. Your surgeon will determine when it is okay to use it. Your nephrologist will continue to direct your dialysis. You can continue to use your Permcath until your new access is ready for use.   10/12/2024 Timothy Ponce [id number] [birth year]  Surgeon(s): Sheree Penne Bruckner, MD  Procedure(s): REVISON OF RIGHT ARM ARTERIOVENOUS FISTULA INSERTION OF LEFT FEMORAL VEIN DIALYSIS CATHETER   May stick graft immediately   May stick graft on designated area only:   X Do not stick right AV fistula for 4 weeks    If you  have any questions, please call the office at [phone].

## 2024-10-12 NOTE — Op Note (Signed)
    Patient name: Timothy Ponce MRN: [id number] DOB: 01/13/1956 Sex: male  10/12/2024 Pre-operative Diagnosis: ESRD Post-operative diagnosis:  Same Surgeon:  Penne C. Sheree, MD Assistant: Curry Damme, PA Procedure Performed: 1.  Placement of left common femoral vein 44 cm tunneled dialysis catheter with ultrasound fluoroscopic guidance 2.  Revision right arm AV fistula with plication of pseudoaneurysm   Indications: 68 year old male with end-stage renal disease dialyzing via right forearm AV fistula that has ulceration.  He is indicated for revision and will need catheter for dialysis.  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  Findings: The right common femoral vein appeared occluded.  Left common femoral vein was patent and compressible and a femoral TDC was placed at terminated at the distal IVC.  The ulcerated fistula segment was removed and the fistula was primarily plicated and the skin was closed with staples.   Procedure:  The patient was identified in the holding area and taken to the operating room where LMA anesthesia was induced.  He was sterilely prepped and draped in the usual fashion, antibiotics were administered and a timeout was called.  We began using ultrasound to identify first the common femoral vein on the right which appeared occluded then on the left which appeared patent and compressible.  This was cannulated with a micropuncture needle followed by wire and the sheath.  Under ultrasound images saved the permanent record.  We placed a J-wire up to the level of the SVC where it is known to be occluded.  We tunneled a 44 cm catheter from a counterincision.  We then dilated the wire tract place introducer sheath under fluoroscopic guidance.  The catheter was then placed over the wire up to the terminal IVC.  This was flushed with heparinized saline and then locked with 2.6 cc of concentrated heparin  both ports.  The catheter was affixed to the skin with nylon  suture and the entrance site was closed with Monocryl and Dermabond and a sterile dressing was placed.  Attention was then turned to the right upper extremity.  The arm was first exsanguinated of blood and tourniquet was inflated.  The ulcerated area was removed sharply.  The fistula is separated from the skin.  The fistula is filled with saline and plicated with running 5-0 Prolene suture in a mattress fashion.  Prior to completion anastomosis we allowed flushing and then released the tourniquet.  After this we completed the anastomosis.  We then freed up the skin over top and closed with staples.  A sterile dressing was applied and the arm was wrapped with Ace wrap.  He was awakened from anesthesia having tolerated the procedure without any complication.   EBL: 50cc   Gianella Chismar C. Sheree, MD [facility] [place] Office: [phone] Pager: [phone]

## 2024-10-12 NOTE — Transfer of Care (Signed)
 Immediate Anesthesia Transfer of Care Note  Patient: Timothy Ponce  Procedure(s) Performed: REVISON OF RIGHT ARM ARTERIOVENOUS FISTULA (Right: Arm Upper) INSERTION OF LEFT FEMORAL VEIN DIALYSIS CATHETER (Groin)  Patient Location: PACU  Anesthesia Type:General  Level of Consciousness: sedated  Airway & Oxygen Therapy: Patient Spontanous Breathing and Patient connected to nasal cannula oxygen  Post-op Assessment: Report given to RN and Post -op Vital signs reviewed and stable  Post vital signs: Reviewed and stable  Last Vitals:  Vitals Value Taken Time  BP    Temp    Pulse 52 10/12/24 15:34  Resp 1 10/12/24 15:33  SpO2 100 % 10/12/24 15:34  Vitals shown include unfiled device data.  Last Pain:  Vitals:   10/12/24 1242  TempSrc:   PainSc: 0-No pain         Complications: No notable events documented.

## 2024-10-12 NOTE — Anesthesia Procedure Notes (Signed)
 Procedure Name: LMA Insertion Date/Time: 10/12/2024 2:04 PM  Performed by: Shelley Pooley A, CRNAPre-anesthesia Checklist: Patient identified, Emergency Drugs available, Suction available and Patient being monitored Patient Re-evaluated:Patient Re-evaluated prior to induction Oxygen Delivery Method: Circle System Utilized Preoxygenation: Pre-oxygenation with 100% oxygen Induction Type: IV induction Ventilation: Mask ventilation without difficulty LMA: LMA with gastric port inserted LMA Size: 4.0 Number of attempts: 1 Airway Equipment and Method: Bite block Placement Confirmation: positive ETCO2 Tube secured with: Tape Dental Injury: Teeth and Oropharynx as per pre-operative assessment

## 2024-10-13 ENCOUNTER — Encounter (HOSPITAL_COMMUNITY): Payer: Self-pay | Admitting: Vascular Surgery

## 2024-10-13 NOTE — Anesthesia Postprocedure Evaluation (Signed)
 Anesthesia Post Note  Patient: Timothy Ponce  Procedure(s) Performed: REVISON OF RIGHT ARM ARTERIOVENOUS FISTULA (Right: Arm Upper) INSERTION OF LEFT FEMORAL VEIN DIALYSIS CATHETER (Groin)     Patient location during evaluation: PACU Anesthesia Type: General Level of consciousness: awake and alert Pain management: pain level controlled Vital Signs Assessment: post-procedure vital signs reviewed and stable Respiratory status: spontaneous breathing, nonlabored ventilation, respiratory function stable and patient connected to nasal cannula oxygen Cardiovascular status: blood pressure returned to baseline and stable Postop Assessment: no apparent nausea or vomiting Anesthetic complications: no   No notable events documented.  Last Vitals:  Vitals:   10/12/24 1800 10/12/24 1815  BP: 134/81 135/74  Pulse: 72 69  Resp: 15 15  Temp: (!) 36 C (!) 36.3 C  SpO2: 100% 98%    Last Pain:  Vitals:   10/12/24 1815  TempSrc:   PainSc: 0-No pain                 Lynwood MARLA Cornea

## 2024-10-14 DIAGNOSIS — N2581 Secondary hyperparathyroidism of renal origin: Secondary | ICD-10-CM | POA: Diagnosis not present

## 2024-10-14 DIAGNOSIS — Z992 Dependence on renal dialysis: Secondary | ICD-10-CM | POA: Diagnosis not present

## 2024-10-15 ENCOUNTER — Telehealth: Payer: Self-pay

## 2024-10-15 DIAGNOSIS — Z992 Dependence on renal dialysis: Secondary | ICD-10-CM | POA: Diagnosis not present

## 2024-10-15 DIAGNOSIS — N186 End stage renal disease: Secondary | ICD-10-CM | POA: Diagnosis not present

## 2024-10-15 DIAGNOSIS — I129 Hypertensive chronic kidney disease with stage 1 through stage 4 chronic kidney disease, or unspecified chronic kidney disease: Secondary | ICD-10-CM | POA: Diagnosis not present

## 2024-10-15 NOTE — Telephone Encounter (Signed)
 Pt's mother, Carlis, called asking about wound care for pt's AVF.  Reviewed wound care instructions sent home in the AVS.  Mother did report scant bright red drainage from incision site.  She knows to keep the wound clean and dry and cover with dry gauze if needed for scant drainage.   Mother knows to call EMS or take patient to ED if the drainage increases and is soaking gauze on incision, for rapid swelling under the incision or dehiscence of the incision.

## 2024-10-16 DIAGNOSIS — N2581 Secondary hyperparathyroidism of renal origin: Secondary | ICD-10-CM | POA: Diagnosis not present

## 2024-10-16 DIAGNOSIS — Z992 Dependence on renal dialysis: Secondary | ICD-10-CM | POA: Diagnosis not present

## 2024-10-19 DIAGNOSIS — Z992 Dependence on renal dialysis: Secondary | ICD-10-CM | POA: Diagnosis not present

## 2024-10-19 DIAGNOSIS — N2581 Secondary hyperparathyroidism of renal origin: Secondary | ICD-10-CM | POA: Diagnosis not present

## 2024-10-23 DIAGNOSIS — N2581 Secondary hyperparathyroidism of renal origin: Secondary | ICD-10-CM | POA: Diagnosis not present

## 2024-10-23 DIAGNOSIS — Z992 Dependence on renal dialysis: Secondary | ICD-10-CM | POA: Diagnosis not present

## 2024-10-26 DIAGNOSIS — N186 End stage renal disease: Secondary | ICD-10-CM | POA: Diagnosis not present

## 2024-10-28 DIAGNOSIS — N186 End stage renal disease: Secondary | ICD-10-CM | POA: Diagnosis not present

## 2024-11-01 ENCOUNTER — Other Ambulatory Visit: Payer: Self-pay | Admitting: Podiatry

## 2024-11-01 ENCOUNTER — Ambulatory Visit: Admitting: Podiatry

## 2024-11-01 ENCOUNTER — Encounter: Payer: Self-pay | Admitting: Podiatry

## 2024-11-01 DIAGNOSIS — B351 Tinea unguium: Secondary | ICD-10-CM

## 2024-11-01 DIAGNOSIS — I739 Peripheral vascular disease, unspecified: Secondary | ICD-10-CM | POA: Diagnosis not present

## 2024-11-01 DIAGNOSIS — M79674 Pain in right toe(s): Secondary | ICD-10-CM

## 2024-11-01 DIAGNOSIS — M79675 Pain in left toe(s): Secondary | ICD-10-CM | POA: Diagnosis not present

## 2024-11-01 NOTE — Progress Notes (Signed)
  Subjective:  Patient ID: Timothy Ponce, male    DOB: 1956/07/27,   MRN: [id number]  No chief complaint on file.   68 y.o. male presents for concern of thickened elongated and painful nails that are difficult to trim. Requesting to have them trimmed today. Relates burning and tingling in their feet. Patient is diabetic and last A1c was  Lab Results  Component Value Date   HGBA1C 4.9 05/31/2020   .   PCP:  Catalina Bare, MD    . Denies any other pedal complaints. Denies n/v/f/c.   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 Dale)   ESRD (end stage renal disease) (HCC)    since 1991; [facility], [address]   Glaucoma    Hypertension    Hypertensive chronic kidney disease    Hypotension    Neuropathy    hands - per patient   Peripheral vascular disease     Objective:  Physical Exam: Vascular: DP/PT pulses 1/4 bilateral. CFT <5 seconds. Feet cold to touch. Absent hair growth on digits. Edema noted to bilateral lower extremities. Xerosis noted bilaterally.  Skin. No lacerations or abrasions bilateral feet. Nails 2-5 bilateral hallux left   are thickened discolored and elongated with subungual debris.  Musculoskeletal: MMT 5/5 bilateral lower extremities in DF, PF, Inversion and Eversion. Deceased ROM in DF of ankle joint.  Left hallux amputation Neurological: Sensation intact to light touch. Protective sensation diminished bilateral.    Assessment:   1. Pain due to onychomycosis of toenails of both feet   2. Peripheral arterial disease      Plan:  Patient was evaluated and treated and all questions answered. -Discussed and educated patient on foot care,  especially with  regards to the vascular, neurological and musculoskeletal systems.  -Stressed the importance of good glycemic control and the detriment of not  controlling glucose levels in relation to the foot. -Discussed supportive shoes at all times and checking feet regularly.  -Mechanically debrided all nails 1-5 bilateral using sterile nail nipper and filed with dremel without incident  -Answered all patient questions -Patient to return  in 3 months for at risk foot care -Patient advised to call the office if any problems or questions arise in the meantime.   Asberry Failing, DPM

## 2024-11-02 DIAGNOSIS — N186 End stage renal disease: Secondary | ICD-10-CM | POA: Diagnosis not present

## 2024-11-03 ENCOUNTER — Ambulatory Visit: Attending: Vascular Surgery | Admitting: Physician Assistant

## 2024-11-03 ENCOUNTER — Ambulatory Visit: Admitting: Podiatry

## 2024-11-03 VITALS — BP 120/73 | HR 87 | Temp 98.3°F | Ht 68.0 in | Wt 126.0 lb

## 2024-11-03 DIAGNOSIS — N186 End stage renal disease: Secondary | ICD-10-CM

## 2024-11-03 NOTE — Progress Notes (Signed)
 POST OPERATIVE OFFICE NOTE    CC:  F/u for surgery  HPI:  This is a 68 y.o. male who is s/p left CFV TDC and right arm AVF with plication of pseudoaneurysm. He has a known history of SVC syndrome and ESRD on HD. He presents today for this staple removal of his right arm AVF. Denies signs and symptoms of steal syndrome. He and his caregiver think his incision is healing well. They have no concerns at this time. He is dialyzing from L femoral TDC on Tuesday, Thursday, and Saturday schedule at the [facility] kidney center.  Allergies  Allergen Reactions   Iron Dextran Shortness Of Breath    Infed Iron Infusion    Darvon [Propoxyphene] Other (See Comments)    Lungs filling up with fluid (reaction to Darvon/Darvocet)     Lactose Other (See Comments)    Gi upset/ sugar in milk   Milk (Cow) Other (See Comments)    Gi upset    Current Outpatient Medications  Medication Sig Dispense Refill   acetaminophen  (TYLENOL ) 650 MG CR tablet Take 650 mg by mouth every 8 (eight) hours as needed for pain.     brimonidine  (ALPHAGAN ) 0.2 % ophthalmic solution Place 1 drop into both eyes 2 (two) times daily.     calcium elemental as carbonate (TUMS ULTRA 1000) 400 MG chewable tablet Chew 1,000 mg by mouth daily as needed for heartburn.     cinacalcet  (SENSIPAR ) 60 MG tablet Take 60 mg by mouth daily with supper.     dorzolamide -timolol  (COSOPT ) 2-0.5 % ophthalmic solution Place 1 drop into both eyes 2 (two) times daily.     fexofenadine (ALLEGRA) 180 MG tablet Take 180 mg by mouth daily as needed for allergies.     multivitamin (RENA-VIT) TABS tablet Take 1 tablet by mouth daily.     oxyCODONE -acetaminophen  (PERCOCET) 5-325 MG tablet Take 1 tablet by mouth every 6 (six) hours as needed for severe pain (pain score 7-10). 12 tablet 0   ROCKLATAN 0.02-0.005 % SOLN Place 1 drop into both eyes at bedtime.     sevelamer  carbonate (RENVELA ) 800 MG tablet Take 2,400 mg by mouth 3 (three) times daily with meals.      No current facility-administered medications for this visit.     ROS:  See HPI  Physical Exam:  Vitals:   11/03/24 1316  BP: 120/73  Pulse: 87  Temp: 98.3 F (36.8 C)  TempSrc: Temporal  Weight: 57.2 kg  Height: 5' 8 (1.727 m)    Incision:  Clean, dry, and scabbed.  Extremities:  Right arm and hand are warm. Fistula thrill, present.  Neuro: Alert and oriented.  Assessment/Plan:  This is a 68 y.o. male who is s/p placement of left CFV TDC and revision of right arm AVF with plication of pseudoaneursym. His fistula is healing well. There is a thrill present. His distal right hand is appropriately warm. All of the staples were removed with no bleeding. Steri-strips were applied. He was counseled that the strips may fall off in 1-2 days and that is okay. The fistula can be cannulated in one weeks time, however, he would like to wait longer prior to cannulating the fistula. Therefore, we agreed on beginning cannulation of the AVF on 11/16/2024. It was recommended that if he has any questions or concerns to call the office.  He can follow-up in the office as needed.     Lytle Pizza, PA-S [facility] [phone]  I  agree with the above and was present for the entire office visit.  Clinic MD:  Gretta on call

## 2024-11-04 DIAGNOSIS — N186 End stage renal disease: Secondary | ICD-10-CM | POA: Diagnosis not present

## 2024-11-06 DIAGNOSIS — N2581 Secondary hyperparathyroidism of renal origin: Secondary | ICD-10-CM | POA: Diagnosis not present

## 2024-11-06 DIAGNOSIS — Z992 Dependence on renal dialysis: Secondary | ICD-10-CM | POA: Diagnosis not present

## 2024-11-10 DIAGNOSIS — N186 End stage renal disease: Secondary | ICD-10-CM | POA: Diagnosis not present

## 2024-11-14 DIAGNOSIS — I129 Hypertensive chronic kidney disease with stage 1 through stage 4 chronic kidney disease, or unspecified chronic kidney disease: Secondary | ICD-10-CM | POA: Diagnosis not present

## 2024-11-14 DIAGNOSIS — Z992 Dependence on renal dialysis: Secondary | ICD-10-CM | POA: Diagnosis not present

## 2024-11-14 DIAGNOSIS — N186 End stage renal disease: Secondary | ICD-10-CM | POA: Diagnosis not present

## 2024-12-29 ENCOUNTER — Other Ambulatory Visit (HOSPITAL_COMMUNITY): Payer: Self-pay | Admitting: Nephrology

## 2024-12-29 ENCOUNTER — Telehealth (HOSPITAL_COMMUNITY): Payer: Self-pay

## 2024-12-29 DIAGNOSIS — Z992 Dependence on renal dialysis: Secondary | ICD-10-CM

## 2024-12-29 NOTE — Telephone Encounter (Signed)
LMOM for pt to callback for scheduling

## 2025-01-05 ENCOUNTER — Encounter (HOSPITAL_COMMUNITY): Payer: Self-pay

## 2025-01-05 ENCOUNTER — Other Ambulatory Visit (HOSPITAL_COMMUNITY)

## 2025-01-19 ENCOUNTER — Ambulatory Visit: Admitting: Physician Assistant

## 2025-01-19 VITALS — BP 142/76 | HR 67 | Temp 97.8°F | Resp 20 | Ht 68.0 in | Wt 126.0 lb

## 2025-01-19 DIAGNOSIS — T82898A Other specified complication of vascular prosthetic devices, implants and grafts, initial encounter: Secondary | ICD-10-CM

## 2025-01-19 DIAGNOSIS — N186 End stage renal disease: Secondary | ICD-10-CM | POA: Diagnosis not present

## 2025-01-19 NOTE — Progress Notes (Signed)
 "   Established Dialysis Access   History of Present Illness   Timothy Ponce is a 69 y.o. (October 23, 1956) male who presents for evaluation of aneurysmal degeneration of right AV Fistula at request of Dr. Marlee. He is s/p revision of his right AV fistula with plication of pseudoaneurysm on 10/12/24 by Dr. Sheree. His fistula had had aneurysmal degeneration at that time with an ulceration. He now explains that they just recently started cannulating his fistula and at time of his second session one of the techs said there was a bruise present and wanted to avoid sticking the area. At his next session he was told there were bubbles on is arm. He denies any bleeding issues and he says the fistula was functioning fine. He is however legally blind so unable to really evaluate the fistula himself. At the same time the Dialysis center has been asking him about when he is scheduled to have his TDC removed. He therefore requested evaluation of his fistula before his [facility] was removed in case he needed further surgery. He has a known history of SVC syndrome and ESRD on HD. He has a femoral TDC and dialyzes on Tuesday, Thursday, and Saturday schedule at the [facility] kidney center.   Current Outpatient Medications  Medication Sig Dispense Refill   acetaminophen  (TYLENOL ) 650 MG CR tablet Take 650 mg by mouth every 8 (eight) hours as needed for pain.     brimonidine  (ALPHAGAN ) 0.2 % ophthalmic solution Place 1 drop into both eyes 2 (two) times daily.     calcium elemental as carbonate (TUMS ULTRA 1000) 400 MG chewable tablet Chew 1,000 mg by mouth daily as needed for heartburn.     cinacalcet  (SENSIPAR ) 60 MG tablet Take 60 mg by mouth daily with supper.     dorzolamide -timolol  (COSOPT ) 2-0.5 % ophthalmic solution Place 1 drop into both eyes 2 (two) times daily.     fexofenadine (ALLEGRA) 180 MG tablet Take 180 mg by mouth daily as needed for allergies.     multivitamin (RENA-VIT) TABS tablet Take 1 tablet by  mouth daily.     oxyCODONE -acetaminophen  (PERCOCET) 5-325 MG tablet Take 1 tablet by mouth every 6 (six) hours as needed for severe pain (pain score 7-10). 12 tablet 0   ROCKLATAN 0.02-0.005 % SOLN Place 1 drop into both eyes at bedtime.     sevelamer  carbonate (RENVELA ) 800 MG tablet Take 2,400 mg by mouth 3 (three) times daily with meals.     No current facility-administered medications for this visit.    On ROS today: negative unless stated in HPI   Physical Examination   Vitals:   01/19/25 1317  BP: (!) 142/76  Pulse: 67  Resp: 20  Temp: 97.8 F (36.6 C)  TempSrc: Temporal  SpO2: 100%  Weight: 126 lb (57.2 kg)  Height: 5' 8 (1.727 m)   Body mass index is 19.16 kg/m.  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 Skin overlying aneurysms is mobile. There is very superficial dried skin flap overlying mid forearm aneurysm. This is almost just like some scaly skin although I did not remove  Musculo- skeletal M/S 5/5 throughout, Extremities without ischemic changes    Neurologic A&O; CN grossly intact     Medical Decision Making   Timothy Ponce is a 69 y.o. male who presents for evaluation of aneurysmal degeneration of right AV Fistula at request  of Dr. Marlee. He is s/p revision of his right AV fistula with plication of pseudoaneurysm on 10/12/24 by Dr. Sheree. His fistula had had aneurysmal degeneration at that time with an ulceration. This area is since well healed. He now has new area of concern in the mid forearm. The area is not bleeding. I suspect if the area of concern is avoiding during cannulation that it will heal in the next couple weeks. At this time there is no ulceration and the skin is mobile. If we can avoid surgical revision it would be best as he has had numerous plications on the fistula already. - I would recommend maintaining the [facility] for a couple more weeks to see if  the skin will heal and if it does then [facility] can be removed. If not discussed with patient that unfortunately he will need area plicated as he has in the past - He will call if there is further concerns    Teretha Damme, PA-C [facility] [place] Office: [phone]  Clinic MD: Sheree "

## 2025-02-02 ENCOUNTER — Ambulatory Visit: Admitting: Podiatry
